# Patient Record
Sex: Female | Born: 1940 | Race: White | Hispanic: No | Marital: Single | Smoking: Former smoker
Health system: Southern US, Community
[De-identification: ages and names within clinical notes are randomized; demographics above are authoritative.]

## PROBLEM LIST (undated history)

## (undated) DIAGNOSIS — D6481 Anemia due to antineoplastic chemotherapy: Secondary | ICD-10-CM

## (undated) DIAGNOSIS — F419 Anxiety disorder, unspecified: Secondary | ICD-10-CM

## (undated) DIAGNOSIS — R180 Malignant ascites: Secondary | ICD-10-CM

## (undated) DIAGNOSIS — C7931 Secondary malignant neoplasm of brain: Secondary | ICD-10-CM

## (undated) DIAGNOSIS — Z87440 Personal history of urinary (tract) infections: Secondary | ICD-10-CM

## (undated) DIAGNOSIS — R569 Unspecified convulsions: Secondary | ICD-10-CM

## (undated) DIAGNOSIS — G629 Polyneuropathy, unspecified: Secondary | ICD-10-CM

## (undated) DIAGNOSIS — Z8719 Personal history of other diseases of the digestive system: Secondary | ICD-10-CM

## (undated) DIAGNOSIS — K219 Gastro-esophageal reflux disease without esophagitis: Secondary | ICD-10-CM

## (undated) DIAGNOSIS — G4733 Obstructive sleep apnea (adult) (pediatric): Secondary | ICD-10-CM

## (undated) DIAGNOSIS — D279 Benign neoplasm of unspecified ovary: Secondary | ICD-10-CM

## (undated) DIAGNOSIS — C3490 Malignant neoplasm of unspecified part of unspecified bronchus or lung: Secondary | ICD-10-CM

## (undated) DIAGNOSIS — K449 Diaphragmatic hernia without obstruction or gangrene: Secondary | ICD-10-CM

## (undated) DIAGNOSIS — K222 Esophageal obstruction: Secondary | ICD-10-CM

## (undated) DIAGNOSIS — T300 Burn of unspecified body region, unspecified degree: Secondary | ICD-10-CM

## (undated) DIAGNOSIS — J189 Pneumonia, unspecified organism: Secondary | ICD-10-CM

## (undated) DIAGNOSIS — K5730 Diverticulosis of large intestine without perforation or abscess without bleeding: Secondary | ICD-10-CM

## (undated) DIAGNOSIS — Z9889 Other specified postprocedural states: Secondary | ICD-10-CM

## (undated) DIAGNOSIS — C50911 Malignant neoplasm of unspecified site of right female breast: Secondary | ICD-10-CM

## (undated) DIAGNOSIS — D126 Benign neoplasm of colon, unspecified: Secondary | ICD-10-CM

## (undated) DIAGNOSIS — Z5111 Encounter for antineoplastic chemotherapy: Secondary | ICD-10-CM

## (undated) DIAGNOSIS — R112 Nausea with vomiting, unspecified: Secondary | ICD-10-CM

## (undated) DIAGNOSIS — D1803 Hemangioma of intra-abdominal structures: Secondary | ICD-10-CM

## (undated) DIAGNOSIS — Z7189 Other specified counseling: Secondary | ICD-10-CM

## (undated) DIAGNOSIS — K259 Gastric ulcer, unspecified as acute or chronic, without hemorrhage or perforation: Secondary | ICD-10-CM

## (undated) DIAGNOSIS — R1310 Dysphagia, unspecified: Secondary | ICD-10-CM

## (undated) DIAGNOSIS — C44300 Unspecified malignant neoplasm of skin of unspecified part of face: Secondary | ICD-10-CM

## (undated) DIAGNOSIS — R0602 Shortness of breath: Secondary | ICD-10-CM

## (undated) DIAGNOSIS — Z171 Estrogen receptor negative status [ER-]: Secondary | ICD-10-CM

## (undated) DIAGNOSIS — C3491 Malignant neoplasm of unspecified part of right bronchus or lung: Principal | ICD-10-CM

## (undated) DIAGNOSIS — T451X5A Adverse effect of antineoplastic and immunosuppressive drugs, initial encounter: Secondary | ICD-10-CM

## (undated) DIAGNOSIS — K652 Spontaneous bacterial peritonitis: Secondary | ICD-10-CM

## (undated) DIAGNOSIS — Z9989 Dependence on other enabling machines and devices: Secondary | ICD-10-CM

## (undated) HISTORY — DX: Dysphagia, unspecified: R13.10

## (undated) HISTORY — DX: Diverticulosis of large intestine without perforation or abscess without bleeding: K57.30

## (undated) HISTORY — DX: Estrogen receptor negative status (ER-): Z17.1

## (undated) HISTORY — DX: Benign neoplasm of unspecified ovary: D27.9

## (undated) HISTORY — DX: Hemangioma of intra-abdominal structures: D18.03

## (undated) HISTORY — DX: Gastric ulcer, unspecified as acute or chronic, without hemorrhage or perforation: K25.9

## (undated) HISTORY — PX: ANKLE FRACTURE SURGERY: SHX122

## (undated) HISTORY — DX: Adverse effect of antineoplastic and immunosuppressive drugs, initial encounter: T45.1X5A

## (undated) HISTORY — DX: Esophageal obstruction: K22.2

## (undated) HISTORY — DX: Gastro-esophageal reflux disease without esophagitis: K21.9

## (undated) HISTORY — DX: Burn of unspecified body region, unspecified degree: T30.0

## (undated) HISTORY — DX: Malignant neoplasm of unspecified part of unspecified bronchus or lung: C34.90

## (undated) HISTORY — PX: BREAST LUMPECTOMY: SHX2

## (undated) HISTORY — DX: Anemia due to antineoplastic chemotherapy: D64.81

## (undated) HISTORY — DX: Polyneuropathy, unspecified: G62.9

## (undated) HISTORY — DX: Other specified counseling: Z71.89

## (undated) HISTORY — PX: BREAST BIOPSY: SHX20

## (undated) HISTORY — DX: Malignant neoplasm of unspecified part of right bronchus or lung: C34.91

## (undated) HISTORY — DX: Encounter for antineoplastic chemotherapy: Z51.11

## (undated) HISTORY — PX: CHOLECYSTECTOMY: SHX55

## (undated) HISTORY — DX: Diaphragmatic hernia without obstruction or gangrene: K44.9

## (undated) HISTORY — PX: BREAST RECONSTRUCTION: SHX9

## (undated) HISTORY — DX: Benign neoplasm of colon, unspecified: D12.6

## (undated) HISTORY — PX: WRIST FRACTURE SURGERY: SHX121

## (undated) HISTORY — DX: Malignant neoplasm of unspecified site of right female breast: C50.911

---

## 1987-07-28 HISTORY — PX: ABDOMINAL HYSTERECTOMY: SHX81

## 1987-07-28 HISTORY — PX: BREAST RECONSTRUCTION WITH PLACEMENT OF TISSUE EXPANDER AND FLEX HD (ACELLULAR HYDRATED DERMIS): SHX6295

## 1987-07-28 HISTORY — PX: RECONSTRUCTION / CORRECTION OF NIPPLE / AEROLA: SUR1073

## 1987-07-28 HISTORY — PX: MASTECTOMY COMPLETE / SIMPLE W/ SENTINEL NODE BIOPSY: SUR846

## 1998-02-05 ENCOUNTER — Ambulatory Visit (HOSPITAL_COMMUNITY): Admission: RE | Admit: 1998-02-05 | Discharge: 1998-02-05 | Payer: Self-pay

## 1998-05-17 ENCOUNTER — Other Ambulatory Visit: Admission: RE | Admit: 1998-05-17 | Discharge: 1998-05-17 | Payer: Self-pay

## 1998-09-02 ENCOUNTER — Encounter: Payer: Self-pay | Admitting: Oncology

## 1998-09-02 ENCOUNTER — Ambulatory Visit (HOSPITAL_COMMUNITY): Admission: RE | Admit: 1998-09-02 | Discharge: 1998-09-02 | Payer: Self-pay | Admitting: Oncology

## 1998-10-25 ENCOUNTER — Encounter: Payer: Self-pay | Admitting: Oncology

## 1998-10-25 ENCOUNTER — Ambulatory Visit (HOSPITAL_COMMUNITY): Admission: RE | Admit: 1998-10-25 | Discharge: 1998-10-25 | Payer: Self-pay | Admitting: Oncology

## 1998-12-12 ENCOUNTER — Other Ambulatory Visit: Admission: RE | Admit: 1998-12-12 | Discharge: 1998-12-12 | Payer: Self-pay | Admitting: *Deleted

## 1999-02-28 ENCOUNTER — Ambulatory Visit (HOSPITAL_COMMUNITY): Admission: RE | Admit: 1999-02-28 | Discharge: 1999-02-28 | Payer: Self-pay

## 1999-06-03 ENCOUNTER — Ambulatory Visit (HOSPITAL_COMMUNITY): Admission: RE | Admit: 1999-06-03 | Discharge: 1999-06-03 | Payer: Self-pay | Admitting: Internal Medicine

## 1999-10-24 ENCOUNTER — Encounter: Payer: Self-pay | Admitting: Oncology

## 1999-10-24 ENCOUNTER — Ambulatory Visit (HOSPITAL_COMMUNITY): Admission: RE | Admit: 1999-10-24 | Discharge: 1999-10-24 | Payer: Self-pay | Admitting: Oncology

## 2000-02-05 ENCOUNTER — Encounter: Payer: Self-pay | Admitting: Oncology

## 2000-02-05 ENCOUNTER — Ambulatory Visit (HOSPITAL_COMMUNITY): Admission: RE | Admit: 2000-02-05 | Discharge: 2000-02-05 | Payer: Self-pay | Admitting: Oncology

## 2000-04-27 ENCOUNTER — Other Ambulatory Visit: Admission: RE | Admit: 2000-04-27 | Discharge: 2000-04-27 | Payer: Self-pay | Admitting: *Deleted

## 2000-09-15 ENCOUNTER — Ambulatory Visit (HOSPITAL_COMMUNITY): Admission: RE | Admit: 2000-09-15 | Discharge: 2000-09-15 | Payer: Self-pay | Admitting: Oncology

## 2000-09-15 ENCOUNTER — Encounter: Payer: Self-pay | Admitting: Oncology

## 2000-10-26 ENCOUNTER — Encounter: Admission: RE | Admit: 2000-10-26 | Discharge: 2000-10-26 | Payer: Self-pay | Admitting: Oncology

## 2000-10-26 ENCOUNTER — Encounter: Payer: Self-pay | Admitting: Oncology

## 2001-04-28 ENCOUNTER — Other Ambulatory Visit: Admission: RE | Admit: 2001-04-28 | Discharge: 2001-04-28 | Payer: Self-pay | Admitting: *Deleted

## 2001-07-21 ENCOUNTER — Emergency Department (HOSPITAL_COMMUNITY): Admission: EM | Admit: 2001-07-21 | Discharge: 2001-07-21 | Payer: Self-pay | Admitting: Emergency Medicine

## 2001-07-25 ENCOUNTER — Emergency Department (HOSPITAL_COMMUNITY): Admission: EM | Admit: 2001-07-25 | Discharge: 2001-07-25 | Payer: Self-pay | Admitting: Emergency Medicine

## 2001-10-27 ENCOUNTER — Encounter: Payer: Self-pay | Admitting: Oncology

## 2001-10-27 ENCOUNTER — Encounter: Admission: RE | Admit: 2001-10-27 | Discharge: 2001-10-27 | Payer: Self-pay | Admitting: Oncology

## 2001-10-28 ENCOUNTER — Encounter: Admission: RE | Admit: 2001-10-28 | Discharge: 2001-10-28 | Payer: Self-pay | Admitting: Internal Medicine

## 2001-10-28 ENCOUNTER — Encounter: Payer: Self-pay | Admitting: Internal Medicine

## 2002-02-14 ENCOUNTER — Encounter: Payer: Self-pay | Admitting: *Deleted

## 2002-02-14 ENCOUNTER — Emergency Department (HOSPITAL_COMMUNITY): Admission: EM | Admit: 2002-02-14 | Discharge: 2002-02-14 | Payer: Self-pay | Admitting: *Deleted

## 2002-05-01 ENCOUNTER — Other Ambulatory Visit: Admission: RE | Admit: 2002-05-01 | Discharge: 2002-05-01 | Payer: Self-pay | Admitting: *Deleted

## 2002-06-19 ENCOUNTER — Encounter: Payer: Self-pay | Admitting: *Deleted

## 2002-06-19 ENCOUNTER — Emergency Department (HOSPITAL_COMMUNITY): Admission: EM | Admit: 2002-06-19 | Discharge: 2002-06-19 | Payer: Self-pay

## 2002-06-21 ENCOUNTER — Ambulatory Visit (HOSPITAL_BASED_OUTPATIENT_CLINIC_OR_DEPARTMENT_OTHER): Admission: RE | Admit: 2002-06-21 | Discharge: 2002-06-21 | Payer: Self-pay | Admitting: *Deleted

## 2002-07-11 ENCOUNTER — Encounter: Admission: RE | Admit: 2002-07-11 | Discharge: 2002-10-09 | Payer: Self-pay | Admitting: *Deleted

## 2002-11-09 ENCOUNTER — Encounter: Admission: RE | Admit: 2002-11-09 | Discharge: 2002-11-09 | Payer: Self-pay | Admitting: Internal Medicine

## 2002-11-09 ENCOUNTER — Encounter: Payer: Self-pay | Admitting: Internal Medicine

## 2003-05-08 ENCOUNTER — Other Ambulatory Visit: Admission: RE | Admit: 2003-05-08 | Discharge: 2003-05-08 | Payer: Self-pay | Admitting: *Deleted

## 2003-11-13 ENCOUNTER — Encounter: Admission: RE | Admit: 2003-11-13 | Discharge: 2003-11-13 | Payer: Self-pay | Admitting: Oncology

## 2004-05-26 ENCOUNTER — Other Ambulatory Visit: Admission: RE | Admit: 2004-05-26 | Discharge: 2004-05-26 | Payer: Self-pay | Admitting: *Deleted

## 2004-09-25 ENCOUNTER — Ambulatory Visit: Payer: Self-pay | Admitting: Oncology

## 2004-11-13 ENCOUNTER — Encounter: Admission: RE | Admit: 2004-11-13 | Discharge: 2004-11-13 | Payer: Self-pay | Admitting: *Deleted

## 2004-11-25 ENCOUNTER — Ambulatory Visit: Payer: Self-pay | Admitting: Internal Medicine

## 2005-06-02 ENCOUNTER — Other Ambulatory Visit: Admission: RE | Admit: 2005-06-02 | Discharge: 2005-06-02 | Payer: Self-pay | Admitting: *Deleted

## 2005-09-23 ENCOUNTER — Ambulatory Visit: Payer: Self-pay | Admitting: Oncology

## 2005-11-16 ENCOUNTER — Encounter: Admission: RE | Admit: 2005-11-16 | Discharge: 2005-11-16 | Payer: Self-pay | Admitting: Oncology

## 2006-08-09 ENCOUNTER — Other Ambulatory Visit: Admission: RE | Admit: 2006-08-09 | Discharge: 2006-08-09 | Payer: Self-pay | Admitting: *Deleted

## 2006-09-21 ENCOUNTER — Ambulatory Visit: Payer: Self-pay | Admitting: Oncology

## 2006-09-23 LAB — COMPREHENSIVE METABOLIC PANEL
AST: 20 U/L (ref 0–37)
Albumin: 4.4 g/dL (ref 3.5–5.2)
BUN: 16 mg/dL (ref 6–23)
CO2: 24 mEq/L (ref 19–32)
Calcium: 9.7 mg/dL (ref 8.4–10.5)
Chloride: 105 mEq/L (ref 96–112)
Creatinine, Ser: 0.57 mg/dL (ref 0.40–1.20)
Glucose, Bld: 76 mg/dL (ref 70–99)
Potassium: 3.9 mEq/L (ref 3.5–5.3)
Sodium: 138 mEq/L (ref 135–145)
Total Bilirubin: 0.8 mg/dL (ref 0.3–1.2)
Total Protein: 6.6 g/dL (ref 6.0–8.3)

## 2006-09-23 LAB — CBC WITH DIFFERENTIAL/PLATELET
BASO%: 0.7 % (ref 0.0–2.0)
EOS%: 1.7 % (ref 0.0–7.0)
MCV: 87.9 fL (ref 81.0–101.0)
MONO#: 0.5 10*3/uL (ref 0.1–0.9)
MONO%: 7.8 % (ref 0.0–13.0)
WBC: 6.3 10*3/uL (ref 3.9–10.0)

## 2006-09-23 LAB — MORPHOLOGY
PLT EST: ADEQUATE
RBC Comments: NORMAL

## 2006-09-23 LAB — LACTATE DEHYDROGENASE: LDH: 194 U/L (ref 94–250)

## 2006-11-19 ENCOUNTER — Encounter: Admission: RE | Admit: 2006-11-19 | Discharge: 2006-11-19 | Payer: Self-pay | Admitting: *Deleted

## 2006-12-13 ENCOUNTER — Ambulatory Visit (HOSPITAL_COMMUNITY): Admission: RE | Admit: 2006-12-13 | Discharge: 2006-12-13 | Payer: Self-pay | Admitting: Oncology

## 2006-12-24 ENCOUNTER — Ambulatory Visit (HOSPITAL_COMMUNITY): Admission: RE | Admit: 2006-12-24 | Discharge: 2006-12-24 | Payer: Self-pay | Admitting: Oncology

## 2007-01-14 ENCOUNTER — Ambulatory Visit: Payer: Self-pay | Admitting: Internal Medicine

## 2007-01-25 ENCOUNTER — Ambulatory Visit: Payer: Self-pay | Admitting: Internal Medicine

## 2007-08-24 ENCOUNTER — Other Ambulatory Visit: Admission: RE | Admit: 2007-08-24 | Discharge: 2007-08-24 | Payer: Self-pay | Admitting: *Deleted

## 2007-11-21 ENCOUNTER — Encounter: Admission: RE | Admit: 2007-11-21 | Discharge: 2007-11-21 | Payer: Self-pay | Admitting: Oncology

## 2007-11-28 ENCOUNTER — Ambulatory Visit: Payer: Self-pay | Admitting: Oncology

## 2007-11-30 LAB — COMPREHENSIVE METABOLIC PANEL
AST: 21 U/L (ref 0–37)
Albumin: 4.1 g/dL (ref 3.5–5.2)
Alkaline Phosphatase: 45 U/L (ref 39–117)
BUN: 14 mg/dL (ref 6–23)
CO2: 25 mEq/L (ref 19–32)
Chloride: 107 mEq/L (ref 96–112)
Glucose, Bld: 129 mg/dL — ABNORMAL HIGH (ref 70–99)
Potassium: 3.7 mEq/L (ref 3.5–5.3)
Sodium: 141 mEq/L (ref 135–145)

## 2007-11-30 LAB — CBC WITH DIFFERENTIAL/PLATELET
BASO%: 0.6 % (ref 0.0–2.0)
Basophils Absolute: 0 10*3/uL (ref 0.0–0.1)
EOS%: 2.2 % (ref 0.0–7.0)
LYMPH%: 31.5 % (ref 14.0–48.0)
MONO#: 0.4 10*3/uL (ref 0.1–0.9)
MONO%: 6.5 % (ref 0.0–13.0)
Platelets: 249 10*3/uL (ref 145–400)
RBC: 4.37 10*6/uL (ref 3.70–5.32)
RDW: 12.7 % (ref 11.3–14.5)

## 2007-11-30 LAB — MORPHOLOGY: RBC Comments: NORMAL

## 2007-12-07 ENCOUNTER — Encounter: Payer: Self-pay | Admitting: Internal Medicine

## 2008-11-21 ENCOUNTER — Encounter: Admission: RE | Admit: 2008-11-21 | Discharge: 2008-11-21 | Payer: Self-pay | Admitting: Oncology

## 2008-11-28 ENCOUNTER — Ambulatory Visit: Payer: Self-pay | Admitting: Oncology

## 2008-11-30 LAB — CBC WITH DIFFERENTIAL/PLATELET
BASO%: 0.6 % (ref 0.0–2.0)
EOS%: 1.7 % (ref 0.0–7.0)
Eosinophils Absolute: 0.1 10*3/uL (ref 0.0–0.5)
HCT: 38.7 % (ref 34.8–46.6)
HGB: 13.4 g/dL (ref 11.6–15.9)
LYMPH%: 33 % (ref 14.0–49.7)
MCHC: 34.7 g/dL (ref 31.5–36.0)
MONO%: 8.7 % (ref 0.0–14.0)
NEUT%: 56 % (ref 38.4–76.8)
Platelets: 225 10*3/uL (ref 145–400)
RDW: 14.3 % (ref 11.2–14.5)
WBC: 5.8 10*3/uL (ref 3.9–10.3)
lymph#: 1.9 10*3/uL (ref 0.9–3.3)

## 2008-11-30 LAB — COMPREHENSIVE METABOLIC PANEL
AST: 29 U/L (ref 0–37)
BUN: 15 mg/dL (ref 6–23)
Calcium: 9.2 mg/dL (ref 8.4–10.5)
Creatinine, Ser: 0.71 mg/dL (ref 0.40–1.20)
Potassium: 3.8 mEq/L (ref 3.5–5.3)
Total Bilirubin: 1 mg/dL (ref 0.3–1.2)
Total Protein: 6.1 g/dL (ref 6.0–8.3)

## 2008-12-07 ENCOUNTER — Encounter: Payer: Self-pay | Admitting: Internal Medicine

## 2009-03-05 ENCOUNTER — Encounter: Admission: RE | Admit: 2009-03-05 | Discharge: 2009-03-05 | Payer: Self-pay | Admitting: Internal Medicine

## 2009-11-22 ENCOUNTER — Encounter: Admission: RE | Admit: 2009-11-22 | Discharge: 2009-11-22 | Payer: Self-pay | Admitting: Oncology

## 2009-12-04 ENCOUNTER — Ambulatory Visit: Payer: Self-pay | Admitting: Oncology

## 2009-12-04 LAB — COMPREHENSIVE METABOLIC PANEL
ALT: 25 U/L (ref 0–35)
AST: 26 U/L (ref 0–37)
Albumin: 3.9 g/dL (ref 3.5–5.2)
BUN: 15 mg/dL (ref 6–23)
Calcium: 9.4 mg/dL (ref 8.4–10.5)
Chloride: 108 mEq/L (ref 96–112)
Sodium: 139 mEq/L (ref 135–145)
Total Bilirubin: 0.7 mg/dL (ref 0.3–1.2)
Total Protein: 6.8 g/dL (ref 6.0–8.3)

## 2009-12-04 LAB — CBC WITH DIFFERENTIAL/PLATELET
Basophils Absolute: 0.1 10*3/uL (ref 0.0–0.1)
Eosinophils Absolute: 0.1 10*3/uL (ref 0.0–0.5)
HCT: 40.6 % (ref 34.8–46.6)
LYMPH%: 31.7 % (ref 14.0–49.7)
MCH: 32.1 pg (ref 25.1–34.0)
MONO#: 0.5 10*3/uL (ref 0.1–0.9)
NEUT%: 56.4 % (ref 38.4–76.8)
RBC: 4.48 10*6/uL (ref 3.70–5.45)
RDW: 12.8 % (ref 11.2–14.5)
WBC: 6 10*3/uL (ref 3.9–10.3)

## 2009-12-09 ENCOUNTER — Encounter: Payer: Self-pay | Admitting: Internal Medicine

## 2010-01-22 ENCOUNTER — Emergency Department (HOSPITAL_COMMUNITY): Admission: EM | Admit: 2010-01-22 | Discharge: 2010-01-22 | Payer: Self-pay | Admitting: Emergency Medicine

## 2010-05-16 ENCOUNTER — Ambulatory Visit: Admission: RE | Admit: 2010-05-16 | Discharge: 2010-05-16 | Payer: Self-pay | Admitting: Internal Medicine

## 2010-08-01 ENCOUNTER — Ambulatory Visit
Admission: AD | Admit: 2010-08-01 | Discharge: 2010-08-01 | Payer: Self-pay | Source: Home / Self Care | Attending: Internal Medicine | Admitting: Internal Medicine

## 2010-08-16 ENCOUNTER — Other Ambulatory Visit: Payer: Self-pay | Admitting: Oncology

## 2010-08-16 DIAGNOSIS — Z0000 Encounter for general adult medical examination without abnormal findings: Secondary | ICD-10-CM

## 2010-08-16 DIAGNOSIS — Z9011 Acquired absence of right breast and nipple: Secondary | ICD-10-CM

## 2010-08-26 NOTE — Letter (Signed)
[facility]   Imported By: Sherian Rein 12/27/2009 11:08:51  _____________________________________________________________________  External Attachment:    Type:   Image     Comment:   External Document

## 2010-10-16 ENCOUNTER — Other Ambulatory Visit (HOSPITAL_COMMUNITY): Payer: Self-pay | Admitting: Internal Medicine

## 2010-10-16 DIAGNOSIS — R52 Pain, unspecified: Secondary | ICD-10-CM

## 2010-10-20 ENCOUNTER — Ambulatory Visit (HOSPITAL_COMMUNITY): Payer: Self-pay

## 2010-10-20 ENCOUNTER — Other Ambulatory Visit (HOSPITAL_COMMUNITY): Payer: Self-pay | Admitting: Internal Medicine

## 2010-10-20 DIAGNOSIS — M79673 Pain in unspecified foot: Secondary | ICD-10-CM

## 2010-11-24 ENCOUNTER — Ambulatory Visit
Admission: RE | Admit: 2010-11-24 | Discharge: 2010-11-24 | Disposition: A | Discharge status: Home / Self Care | Payer: 59 | Source: Ambulatory Visit | Attending: Oncology | Admitting: Oncology

## 2010-11-24 DIAGNOSIS — Z0000 Encounter for general adult medical examination without abnormal findings: Secondary | ICD-10-CM

## 2010-11-24 DIAGNOSIS — Z9011 Acquired absence of right breast and nipple: Secondary | ICD-10-CM

## 2010-12-01 ENCOUNTER — Encounter (HOSPITAL_BASED_OUTPATIENT_CLINIC_OR_DEPARTMENT_OTHER): Payer: 59 | Admitting: Oncology

## 2010-12-01 ENCOUNTER — Other Ambulatory Visit: Payer: Self-pay | Admitting: Oncology

## 2010-12-01 DIAGNOSIS — Z9010 Acquired absence of unspecified breast and nipple: Secondary | ICD-10-CM

## 2010-12-01 DIAGNOSIS — Z853 Personal history of malignant neoplasm of breast: Secondary | ICD-10-CM

## 2010-12-01 DIAGNOSIS — Z171 Estrogen receptor negative status [ER-]: Secondary | ICD-10-CM

## 2010-12-01 DIAGNOSIS — Z9079 Acquired absence of other genital organ(s): Secondary | ICD-10-CM

## 2010-12-01 LAB — CBC WITH DIFFERENTIAL/PLATELET
Basophils Absolute: 0 10*3/uL (ref 0.0–0.1)
Eosinophils Absolute: 0.1 10*3/uL (ref 0.0–0.5)
HCT: 39 % (ref 34.8–46.6)
MCHC: 35.1 g/dL (ref 31.5–36.0)
MCV: 91.3 fL (ref 79.5–101.0)
MONO%: 6.4 % (ref 0.0–14.0)
NEUT#: 3.6 10*3/uL (ref 1.5–6.5)
Platelets: 224 10*3/uL (ref 145–400)
WBC: 5.6 10*3/uL (ref 3.9–10.3)

## 2010-12-01 LAB — COMPREHENSIVE METABOLIC PANEL
ALT: 26 U/L (ref 0–35)
AST: 26 U/L (ref 0–37)
Albumin: 3.7 g/dL (ref 3.5–5.2)
BUN: 14 mg/dL (ref 6–23)
Chloride: 106 mEq/L (ref 96–112)
Creatinine, Ser: 0.65 mg/dL (ref 0.40–1.20)

## 2010-12-01 LAB — LACTATE DEHYDROGENASE: LDH: 226 U/L (ref 94–250)

## 2010-12-08 ENCOUNTER — Encounter (HOSPITAL_BASED_OUTPATIENT_CLINIC_OR_DEPARTMENT_OTHER): Payer: 59 | Admitting: Oncology

## 2010-12-08 ENCOUNTER — Other Ambulatory Visit: Payer: Self-pay | Admitting: Oncology

## 2010-12-08 DIAGNOSIS — Z1231 Encounter for screening mammogram for malignant neoplasm of breast: Secondary | ICD-10-CM

## 2010-12-08 DIAGNOSIS — Z9010 Acquired absence of unspecified breast and nipple: Secondary | ICD-10-CM

## 2010-12-08 DIAGNOSIS — Z853 Personal history of malignant neoplasm of breast: Secondary | ICD-10-CM

## 2010-12-08 DIAGNOSIS — Z171 Estrogen receptor negative status [ER-]: Secondary | ICD-10-CM

## 2010-12-12 NOTE — Op Note (Signed)
[facility]  Patient:    Molly Black                       MRN: [id number] Proc. Date: 06/03/99 Adm. Date:  [id number] Attending:  Mervin Hack CC:         Genene Churn. Cyndie Chime, M.D.             Almedia Balls. Randell Patient, M.D.                           Operative Report  PROCEDURE:  Colonoscopy.  INDICATIONS:  This 70 year old black female with a history of breast CA has been referred for colonoscopy for neoplastic screening.  She has had a set of Hemoccults which were negative.  There is no family history of colon cancer.  She has a positive family history of hiatal hernia.  The patient discontinued her aspirin one week prior to the procedure.  ENDOSCOPE:  ______.  SEDATION: 1. Versed 7.5 mg IV. 2. Demerol 30 mg IV.  FINDINGS:  Sphincter with DD:  06/03/99 TD:  06/03/99 Job: [id number]

## 2010-12-12 NOTE — Op Note (Signed)
NAME:  Molly Black, Molly Black                         ACCOUNT NO.:  [id number]   MEDICAL RECORD NO.:  [id number]                   PATIENT TYPE:  AMB   LOCATION:  DSC                                  FACILITY:  [facility]   PHYSICIAN:  Lowell Bouton, M.D.      DATE OF BIRTH:  January 16, 1941   DATE OF PROCEDURE:  06/21/2002  DATE OF DISCHARGE:                                 OPERATIVE REPORT   PREOPERATIVE DIAGNOSIS:  Comminuted intra-articular fracture, left distal  radius.   POSTOPERATIVE DIAGNOSIS:  Comminuted intra-articular fracture, left distal  radius.   OPERATION PERFORMED:  Open reduction internal fixation, left distal radius.   SURGEON:  Lowell Bouton, M.D.   ANESTHESIA:  General.   OPERATIVE FINDINGS:  The patient had a comminuted fracture just proximal to  the joint that had an intra-articular extension.  The fracture was  significantly comminuted dorsally.   DESCRIPTION OF PROCEDURE:  Under general anesthesia, with the tourniquet on  the left arm, the left hand was prepped and draped in the usual fashion.  After exsanguinating the limb, the tourniquet was inflated to 225 mmHg.  A  volar approach was made to the distal radius through a longitudinal incision  along the FCR tendon.  Blunt dissection was carried through the subcutaneous  tissues and bleeding points were coagulated.  Blunt dissection was carried  through the FCR sheath and the FCR tendon was retracted ulnarly.  The floor  of the sheath was incised longitudinally with the scissors.  The FPL was  retracted ulnarly and the pronator quadratus was sharply divided from the  attachment on the radius.  It was elevated ulnarly, subperiosteally.  The  fracture site was then identified and then irrigated with saline.  The  patient was in 10 pounds of traction using finger traps on the index and  middle fingers over the end of the table.  The fracture was then reduced and  a DVR plate was used on the  radius.  The slotted screw was applied first  using a 3.5 mm screw.  X-rays were then taken showing good alignment of the  fracture and so the pegs were inserted in the distal holes using 2.0 mm pegs  that were unthreaded.  X-rays showed good alignment of the fracture. An  attempt to reduce the radial styloid had been tried with 35 K-wire and was  unsuccessful so the K-wire was removed.  The remaining 3.5 mm screws were  then inserted proximally.  Final x-rays were taken and fluoroscopy was used  to make sure there was no motion at the fracture site.  The wound was then  irrigated with saline.  The pronator quadratus was repaired with a 4-0  Vicryl.  Subcutaneous tissue was closed over a vessel loop drain using 4-0  Vicryl and the skin was closed with a 3-0 subcuticular Prolene.  Steri-  Strips were applied followed by sterile dressings and a sugar tong splint.  The tourniquet was released with good circulation to the hand.  The patient  went to the recovery room awake and stable in good condition.                                                 Lowell Bouton, M.D.    EMM/MEDQ  D:  06/21/2002  T:  06/21/2002  Job:  [id number]

## 2011-11-25 ENCOUNTER — Ambulatory Visit
Admission: RE | Admit: 2011-11-25 | Discharge: 2011-11-25 | Disposition: A | Discharge status: Home / Self Care | Payer: Medicare Other | Source: Ambulatory Visit | Attending: Oncology | Admitting: Oncology

## 2011-11-25 ENCOUNTER — Other Ambulatory Visit (HOSPITAL_BASED_OUTPATIENT_CLINIC_OR_DEPARTMENT_OTHER): Payer: Medicare Other | Admitting: Lab

## 2011-11-25 ENCOUNTER — Other Ambulatory Visit: Payer: Self-pay | Admitting: Oncology

## 2011-11-25 DIAGNOSIS — Z9010 Acquired absence of unspecified breast and nipple: Secondary | ICD-10-CM

## 2011-11-25 DIAGNOSIS — Z853 Personal history of malignant neoplasm of breast: Secondary | ICD-10-CM

## 2011-11-25 DIAGNOSIS — Z1231 Encounter for screening mammogram for malignant neoplasm of breast: Secondary | ICD-10-CM

## 2011-11-25 DIAGNOSIS — Z171 Estrogen receptor negative status [ER-]: Secondary | ICD-10-CM

## 2011-11-25 LAB — CBC WITH DIFFERENTIAL/PLATELET
BASO%: 1.1 % (ref 0.0–2.0)
EOS%: 1.9 % (ref 0.0–7.0)
MCH: 31.1 pg (ref 25.1–34.0)
MCHC: 34.4 g/dL (ref 31.5–36.0)
RBC: 4.44 10*6/uL (ref 3.70–5.45)
RDW: 13.1 % (ref 11.2–14.5)
WBC: 6.3 10*3/uL (ref 3.9–10.3)
lymph#: 1.8 10*3/uL (ref 0.9–3.3)
nRBC: 0 % (ref 0–0)

## 2011-11-25 LAB — COMPREHENSIVE METABOLIC PANEL
ALT: 17 U/L (ref 0–35)
AST: 23 U/L (ref 0–37)
Albumin: 4.1 g/dL (ref 3.5–5.2)
Alkaline Phosphatase: 29 U/L — ABNORMAL LOW (ref 39–117)
BUN: 18 mg/dL (ref 6–23)
Potassium: 3.7 mEq/L (ref 3.5–5.3)
Sodium: 142 mEq/L (ref 135–145)
Total Protein: 6.5 g/dL (ref 6.0–8.3)

## 2011-12-07 ENCOUNTER — Ambulatory Visit (HOSPITAL_BASED_OUTPATIENT_CLINIC_OR_DEPARTMENT_OTHER): Payer: Medicare Other | Admitting: Oncology

## 2011-12-07 ENCOUNTER — Telehealth: Payer: Self-pay | Admitting: Oncology

## 2011-12-07 ENCOUNTER — Encounter: Payer: Self-pay | Admitting: Oncology

## 2011-12-07 VITALS — BP 135/81 | HR 65 | Temp 97.5°F | Ht 63.5 in | Wt 161.6 lb

## 2011-12-07 DIAGNOSIS — K219 Gastro-esophageal reflux disease without esophagitis: Secondary | ICD-10-CM

## 2011-12-07 DIAGNOSIS — D279 Benign neoplasm of unspecified ovary: Secondary | ICD-10-CM | POA: Insufficient documentation

## 2011-12-07 DIAGNOSIS — G629 Polyneuropathy, unspecified: Secondary | ICD-10-CM

## 2011-12-07 DIAGNOSIS — Z853 Personal history of malignant neoplasm of breast: Secondary | ICD-10-CM

## 2011-12-07 DIAGNOSIS — K5730 Diverticulosis of large intestine without perforation or abscess without bleeding: Secondary | ICD-10-CM | POA: Insufficient documentation

## 2011-12-07 DIAGNOSIS — D1803 Hemangioma of intra-abdominal structures: Secondary | ICD-10-CM

## 2011-12-07 DIAGNOSIS — Z171 Estrogen receptor negative status [ER-]: Secondary | ICD-10-CM

## 2011-12-07 DIAGNOSIS — C50911 Malignant neoplasm of unspecified site of right female breast: Secondary | ICD-10-CM

## 2011-12-07 NOTE — Telephone Encounter (Signed)
gv pt appt schedule for may 2014 and mammo 11/29/2012 @ [facility].

## 2011-12-07 NOTE — Progress Notes (Signed)
Hematology and Oncology Follow Up Visit  Molly Black [facility] [id number] 09-08-1940 71 y.o. 12/07/2011 8:53 PM   Principle Diagnosis: Encounter Diagnoses  Name Primary?  . Carcinoma of breast, stage 2, estrogen receptor positive, right Yes  . Carcinoma of breast, stage 2, estrogen receptor negative, right   . Hemangioma of liver   . Neuropathy, peripheral   . Diverticulosis of colon without diverticulitis   . GERD (gastroesophageal reflux disease)   . Cystadenofibroma of ovary, unspecified laterality      Interim History:   Followup visit for this 71 year old woman with history of stage II, T2 N0 cancer of the right breast diagnosed in June 1989, treated with mastectomy with immediate reconstruction followed by CMF chemotherapy.  Tumor was ER negative, PR positive.  She finally retired from [employer] where she worked for 45 years. 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   She's had no interim medical problems. Unfortunately she lost her only brother about 2 months ago. He had surgery for what sounds like a extensive melanoma skin cancer arising on the back of his scalp. About a month after the surgery he suffered sudden death and it appears that he had a pulmonary embolus and a DVT. He had pain and swelling in his leg for a few weeks after the surgery. She was very close to this brother and is still grieving his loss.  Medications: reviewed  Allergies: No Known Allergies  Review of Systems: Constitutional:   No constitutional symptoms Respiratory: No cough or dyspnea Cardiovascular:  No chest pain or palpitations Gastrointestinal: No change in bowel habit Genito-Urinary: No vaginal bleeding Musculoskeletal: no muscle or bone pain Neurologic: No headache or change in vision Skin: No rash or ecchymosis Remaining ROS negative.  Physical Exam: Blood pressure 135/81, pulse 65, temperature 97.5 F (36.4 C),  temperature source Oral, height 5' 3.5" (1.613 m), weight 161 lb 9.6 oz (73.301 kg). Wt Readings from Last 3 Encounters:  12/07/11 161 lb 9.6 oz (73.301 kg)     General appearance: Well-nourished Caucasian woman HENNT: Pharynx no erythema or exudate Lymph nodes: No lymphadenopathy Breasts: Right breast reconstruction with a large tense saline implant; no left breast masses Lungs: Clear to auscultation resonant to percussion Heart: Regular rhythm no murmur Abdomen: Soft nontender no mass no organomegaly Extremities: No edema no calf tenderness Vascular: No cyanosis Neurologic: Pupils equal round reactive to light, optic disc sharp vessels normal, motor strength 5 over 5, reflexes 1+ symmetric. Skin: No rash or ecchymosis  Lab Results: Lab Results  Component Value Date   WBC 6.3 11/25/2011   HGB 13.8 11/25/2011   HCT 40.2 11/25/2011   MCV 90.6 11/25/2011   PLT 240 11/25/2011     Chemistry      Component Value Date/Time   NA 142 11/25/2011 1428   K 3.7 11/25/2011 1428   CL 107 11/25/2011 1428   CO2 28 11/25/2011 1428   BUN 18 11/25/2011 1428   CREATININE 0.63 11/25/2011 1428      Component Value Date/Time   CALCIUM 9.9 11/25/2011 1428   ALKPHOS 29* 11/25/2011 1428   AST 23 11/25/2011 1428   ALT 17 11/25/2011 1428   BILITOT 1.0 11/25/2011 1428       Radiological Studies: Most recent mammograms done 11/25/2011 show no new disease in the left breast   Impression and Plan: #1. Stage II T2 N0 ER negative cancer of the right breast treated  as outlined above she remains free of any new disease now out a remarkable 24 years from diagnosis in June of 1989. Although I gave her the chance to graduate from this office she would still like to be seen on an annual basis.  #2. History of TAH/BSO May 1997 for a cyst adenofibroma of the ovary  #3. Benign hemangioma of the liver  #4. Idiopathic peripheral neuropathy.  #5. Diverticulosis.  #6. GERD   CC:. Dr. Kirby Funk; Dr. Julio Alm   Levert Feinstein, MD 5/13/20138:53 PM

## 2012-01-04 ENCOUNTER — Encounter (HOSPITAL_COMMUNITY): Payer: Self-pay

## 2012-01-04 ENCOUNTER — Emergency Department (HOSPITAL_COMMUNITY)
Admission: EM | Admit: 2012-01-04 | Discharge: 2012-01-04 | Disposition: A | Discharge status: Home / Self Care | Payer: Medicare Other | Attending: Emergency Medicine | Admitting: Emergency Medicine

## 2012-01-04 ENCOUNTER — Emergency Department (HOSPITAL_COMMUNITY): Payer: Medicare Other

## 2012-01-04 DIAGNOSIS — S43036A Inferior dislocation of unspecified humerus, initial encounter: Secondary | ICD-10-CM | POA: Insufficient documentation

## 2012-01-04 DIAGNOSIS — Z79899 Other long term (current) drug therapy: Secondary | ICD-10-CM | POA: Insufficient documentation

## 2012-01-04 DIAGNOSIS — Z853 Personal history of malignant neoplasm of breast: Secondary | ICD-10-CM | POA: Insufficient documentation

## 2012-01-04 DIAGNOSIS — G609 Hereditary and idiopathic neuropathy, unspecified: Secondary | ICD-10-CM | POA: Insufficient documentation

## 2012-01-04 DIAGNOSIS — K219 Gastro-esophageal reflux disease without esophagitis: Secondary | ICD-10-CM | POA: Insufficient documentation

## 2012-01-04 DIAGNOSIS — Z7982 Long term (current) use of aspirin: Secondary | ICD-10-CM | POA: Insufficient documentation

## 2012-01-04 DIAGNOSIS — K5730 Diverticulosis of large intestine without perforation or abscess without bleeding: Secondary | ICD-10-CM | POA: Insufficient documentation

## 2012-01-04 DIAGNOSIS — D279 Benign neoplasm of unspecified ovary: Secondary | ICD-10-CM | POA: Insufficient documentation

## 2012-01-04 DIAGNOSIS — S43006A Unspecified dislocation of unspecified shoulder joint, initial encounter: Secondary | ICD-10-CM

## 2012-01-04 MED ORDER — HYDROMORPHONE HCL PF 1 MG/ML IJ SOLN
1.0000 mg | Freq: Once | INTRAMUSCULAR | Status: AC
  Administered 2012-01-04: 1 mg via INTRAVENOUS
  Filled 2012-01-04: qty 1

## 2012-01-04 MED ORDER — ETOMIDATE 2 MG/ML IV SOLN
INTRAVENOUS | Status: AC
  Filled 2012-01-04: qty 10

## 2012-01-04 MED ORDER — HYDROCODONE-ACETAMINOPHEN 5-325 MG PO TABS: ORAL_TABLET | ORAL | Status: AC

## 2012-01-04 MED ORDER — ONDANSETRON HCL 4 MG/2ML IJ SOLN
4.0000 mg | Freq: Once | INTRAMUSCULAR | Status: AC
  Administered 2012-01-04: 4 mg via INTRAVENOUS
  Filled 2012-01-04: qty 2

## 2012-01-04 MED ORDER — SODIUM CHLORIDE 0.9 % IV SOLN
INTRAVENOUS | Status: AC | PRN
  Administered 2012-01-04: 100 mL/h via INTRAVENOUS

## 2012-01-04 MED ORDER — ETOMIDATE 2 MG/ML IV SOLN
INTRAVENOUS | Status: AC | PRN
  Administered 2012-01-04: 10 mg via INTRAVENOUS

## 2012-01-04 NOTE — ED Provider Notes (Signed)
Preprocedure  Pre-anesthesia/induction confirmation of laterality/correct procedure site including "time-out."  Provider confirms review of the nurses' note, allergies, medications, pertinent labs, PMH, pre-induction vital signs, pulse oximetry, pain level, and ECG (as applicable), and patient condition satisfactory for commencing with order for sedation and procedure.  Medical screening examination/treatment/procedure(s) were conducted as a shared visit with non-physician practitioner(s) and myself.  I personally evaluated the patient during the encounter  Patient given etomidate for sedation with moderate response. Right Shoulder was then reduced using traction rotation. Patient recovered well from the conscious sedation. Awake alert can handle her secretions. post reduction films showed good alignment.  Molly Baker, MD 01/04/12 1308

## 2012-01-04 NOTE — ED Provider Notes (Signed)
History     CSN: [id number]  Arrival date & time 01/04/12  1046   First MD Initiated Contact with Patient 01/04/12 1103      Chief Complaint  Patient presents with  . Fall    (Consider location/radiation/quality/duration/timing/severity/associated sxs/prior treatment) HPI Comments: Patient complains of pain to her right shoulder that occurred several hours ago when she slipped and fell onto her right shoulder. Pain to the right shoulder is worse with any type of movement and improves slightly with immobilization.  Patient states she slipped on a wet surface. She denies head injury, headache,  neck pain, chest pain, rib pain, dizziness, or vomiting.  Patient is a 71 y.o. female presenting with fall and shoulder injury. The history is provided by the patient.  Fall The accident occurred 1 to 2 hours ago. The fall occurred while standing. She landed on concrete. There was no blood loss. The point of impact was the right shoulder. The pain is present in the right shoulder. The pain is severe. She was ambulatory at the scene. There was no entrapment after the fall. There was no drug use involved in the accident. There was no alcohol use involved in the accident. Pertinent negatives include no fever, no numbness, no abdominal pain, no nausea, no vomiting, no headaches, no loss of consciousness and no tingling. The symptoms are aggravated by activity, rotation, extension, pressure on the injury and use of the injured limb. Prehospitalization: sling applied to right arm. The treatment provided mild relief.  Shoulder Injury This is a new problem. The current episode started today. The problem occurs constantly. The problem has been unchanged. Associated symptoms include arthralgias. Pertinent negatives include no abdominal pain, chest pain, chills, fever, headaches, joint swelling, nausea, neck pain, numbness, rash, sore throat, vomiting or weakness. The symptoms are aggravated by bending (movement and  palpation). She has tried immobilization for the symptoms. The treatment provided no relief.    Past Medical History  Diagnosis Date  . Carcinoma of breast, stage 2, estrogen receptor negative, right 12/07/2011    T2N0 right breast mastectomy/TRAM reconstruction ER negative PR positive  June 1989  Then CMF chemo  . Hemangioma of liver 12/07/2011  . Neuropathy, peripheral 12/07/2011  . Diverticulosis of colon without diverticulitis 12/07/2011  . GERD (gastroesophageal reflux disease) 12/07/2011  . Cystadenofibroma of ovary, unspecified laterality 12/07/2011    Past Surgical History  Procedure Date  . Mastectomy     right    No family history on file.  History  Substance Use Topics  . Smoking status: Never Smoker   . Smokeless tobacco: Not on file  . Alcohol Use: No    OB History    Grav Para Term Preterm Abortions TAB SAB Ect Mult Living                  Review of Systems  Constitutional: Negative for fever and chills.  HENT: Negative for sore throat and neck pain.   Respiratory: Negative for chest tightness and shortness of breath.   Cardiovascular: Negative for chest pain.  Gastrointestinal: Negative for nausea, vomiting and abdominal pain.  Genitourinary: Negative for dysuria and difficulty urinating.  Musculoskeletal: Positive for arthralgias. Negative for joint swelling.  Skin: Negative for color change, rash and wound.  Neurological: Negative for dizziness, tingling, loss of consciousness, weakness, numbness and headaches.  All other systems reviewed and are negative.    Allergies  Review of patient's allergies indicates no known allergies.  Home Medications   Current  Outpatient Rx  Name Route Sig Dispense Refill  . ALENDRONATE SODIUM 70 MG PO TABS Oral Take 70 mg by mouth every 7 (seven) days. Take with a full glass of water on an empty stomach. On Saturday    . ASPIRIN EC 81 MG PO TBEC Oral Take 81 mg by mouth daily.    Marland Kitchen CALCIUM 600+D PO Oral Take by mouth  2 (two) times daily.    Marland Kitchen VITAMIN D3 1000 UNITS PO CAPS Oral Take 1 capsule by mouth daily.     . OMEGA-3 FATTY ACIDS 1000 MG PO CAPS Oral Take 1 g by mouth daily.    . MULTIVITAMINS PO CAPS Oral Take 1 capsule by mouth daily.    . OCUVITE-LUTEIN PO CAPS Oral Take 1 capsule by mouth daily.    Marland Kitchen VITAMIN B-12 1000 MCG PO TABS Oral Take 1,000 mcg by mouth daily.    Marland Kitchen ESOMEPRAZOLE MAGNESIUM 40 MG PO CPDR Oral Take 40 mg by mouth daily as needed.      BP 126/89  Pulse 62  Temp(Src) 97.8 F (36.6 C) (Oral)  Ht 5\' 3"  (1.6 m)  Wt 159 lb (72.122 kg)  BMI 28.17 kg/m2  SpO2 96%  Physical Exam  Nursing note and vitals reviewed. Constitutional: She is oriented to person, place, and time. She appears well-developed and well-nourished. No distress.  HENT:  Head: Normocephalic and atraumatic.  Neck: Normal range of motion. Neck supple.  Cardiovascular: Normal rate, regular rhythm, normal heart sounds and intact distal pulses.   No murmur heard. Pulmonary/Chest: Effort normal and breath sounds normal.  Abdominal: Soft. She exhibits no distension. There is no tenderness.  Musculoskeletal: She exhibits tenderness. She exhibits no edema.       Right shoulder: She exhibits decreased range of motion, tenderness and pain. She exhibits no swelling, no effusion, no crepitus, no deformity, no laceration, normal pulse and normal strength.       Arms:      ttp of the right shoulder, step off deformity noted.  Radial pulse is brisk, sensation intact.  CR< 2 sec. Right elbow and wrist are NT.      Lymphadenopathy:    She has no cervical adenopathy.  Neurological: She is alert and oriented to person, place, and time. She exhibits normal muscle tone. Coordination normal.  Skin: Skin is warm and dry.    ED Course  Procedures (including critical care time)  Dg Shoulder Right  01/04/2012  *RADIOLOGY REPORT*  Clinical Data: Right shoulder pain after fall.  RIGHT SHOULDER - 2+ VIEW  Comparison: None.  Findings:  Two-view exam shows a intro inferior dislocation of the humeral head.  Positioning is somewhat suboptimal wall and limits assessment for associated fractures.  Curvilinear density over the superolateral right lung raises a question of associated pneumothorax.  IMPRESSION: Anterior right shoulder dislocation.  Question right-sided pneumothorax.  Dedicated chest x-ray recommended to further evaluate.  I personally discussed these findings by telephone with Dr. Freida Busman at to 11:44 a.m. on 01/04/12.  Original Report Authenticated By: ERIC A. MANSELL, M.D.   Dg Chest Port 1 View  01/04/2012  *RADIOLOGY REPORT*  Clinical Data: Fall  PORTABLE CHEST - 1 VIEW  Comparison: Portable exam 1213 hours compared to 01/22/2010  Findings: Slight rotation to the right. Mild enlargement of cardiac silhouette. Mediastinal contours and pulmonary vascularity normal. Minimal right base atelectasis. Remaining lungs clear. No pleural effusion or pneumothorax. Questionable nodular density projects over the right heart, 1.4 cm diameter, could be  related to pulmonary vein but pulmonary nodule not excluded. Bones appear demineralized.  IMPRESSION: Minimal enlargement of cardiac silhouette. Right basilar atelectasis. Cannot exclude a 1.4 cm diameter right basilar lung nodule though this could potentially be related to a prominent pulmonary vein the patient is slightly rotated. Upright PA and lateral chest radiograph recommended for further evaluation.  Original Report Authenticated By: Lollie Marrow, M.D.       MDM    Previous medical charts, nursing notes and vitals signs from this visit were reviewed by me   All laboratory results and/or imaging results performed on this visit, if applicable, were reviewed by me and discussed with the patient and/or parent as well as recommendation for follow-up    MEDICATIONS GIVEN IN ED:  Dilaudid , zofran IV   discussed x ray results with EDP.  Patient is NV intact at this time.  Further  management of shoulder reduction and conscious sedation to be performed by the EDP, Dr. Freida Busman.          Molly Black L. Dajanique Robley, [place] 01/04/12 1431

## 2012-01-04 NOTE — ED Notes (Signed)
Pt slipped on wet steps at the pool and fell onto r shoulder.  Deformity noted to r shoulder.  EMS placed r arm in sling.  Denies any LOC, denies hurting anywhere else.

## 2012-01-04 NOTE — ED Provider Notes (Signed)
Medical screening examination/treatment/procedure(s) were conducted as a shared visit with non-physician practitioner(s) and myself.  I personally evaluated the patient during the encounter  Toy Baker, MD 01/04/12 1455

## 2012-01-04 NOTE — Discharge Instructions (Signed)

## 2012-01-13 ENCOUNTER — Ambulatory Visit (INDEPENDENT_AMBULATORY_CARE_PROVIDER_SITE_OTHER): Payer: Medicare Other | Admitting: Orthopedic Surgery

## 2012-01-13 ENCOUNTER — Encounter: Payer: Self-pay | Admitting: Orthopedic Surgery

## 2012-01-13 VITALS — BP 126/86 | Ht 63.0 in | Wt 159.0 lb

## 2012-01-13 DIAGNOSIS — S43016A Anterior dislocation of unspecified humerus, initial encounter: Secondary | ICD-10-CM

## 2012-01-13 NOTE — Progress Notes (Signed)
Patient ID: Molly Black [facility], female   DOB: 1941-02-27, 71 y.o.   MRN: [id number]    Chief Complaint  Patient presents with  . Shoulder Injury    follow up dislocation of shoulder, DOI 01/04/12   Shoulder dislocation, RIGHT. Date of injury January 04, 2012 Injury occurred at the [facility], secondary to slipping.  Pain currently, mild intermittent.  Initial treatment sling after closed reduction in the emergency room.  Review of systems was normal except for some respiratory disease.   BP 126/86  Ht 5\' 3"  (1.6 m)  Wt 72.122 kg (159 lb)  BMI 28.17 kg/m2  The following portions of the patient's history were reviewed and updated as appropriate: allergies, current medications, past family history, past medical history, past social history, past surgical history and problem list.   Review of Systems A comprehensive review of systems was negative.   Objective:    BP 122/72  Ht 4\' 11"  (1.499 m)  Wt 61.236 kg (135 lb)  BMI 27.27 kg/m2        Vital signs are stable as recorded  General appearance is normal  The patient is alert and oriented x3  The patient's mood and affect are normal  Gait assessment: normal  The cardiovascular exam reveals normal pulses and temperature without edema swelling.  The lymphatic system is negative for palpable lymph nodes  The sensory exam is normal.  There are no pathologic reflexes.  Balance is normal.   Exam of the RIGHT shoulder.  Minimal swelling. Active abduction, 90. Stability normal. Strength mild weakness in the cuff. Skin intact.  X-rays were done at the hospital. Initial film shows anterior dislocation, followed by a secondary film showing reduction      Assessment:    Right shoulder dislocation     Plan:    PT referral.

## 2012-01-13 NOTE — Patient Instructions (Addendum)
Remove swelling  Occupational therapy.  No aerobics for one month

## 2012-01-14 ENCOUNTER — Other Ambulatory Visit: Payer: Self-pay | Admitting: *Deleted

## 2012-01-14 DIAGNOSIS — S43006A Unspecified dislocation of unspecified shoulder joint, initial encounter: Secondary | ICD-10-CM

## 2012-01-19 ENCOUNTER — Ambulatory Visit (HOSPITAL_COMMUNITY)
Admission: RE | Admit: 2012-01-19 | Discharge: 2012-01-19 | Disposition: A | Discharge status: Home / Self Care | Payer: Medicare Other | Source: Ambulatory Visit | Attending: Orthopedic Surgery | Admitting: Orthopedic Surgery

## 2012-01-19 DIAGNOSIS — IMO0001 Reserved for inherently not codable concepts without codable children: Secondary | ICD-10-CM | POA: Insufficient documentation

## 2012-01-19 DIAGNOSIS — M25519 Pain in unspecified shoulder: Secondary | ICD-10-CM | POA: Insufficient documentation

## 2012-01-19 DIAGNOSIS — S43006A Unspecified dislocation of unspecified shoulder joint, initial encounter: Secondary | ICD-10-CM | POA: Insufficient documentation

## 2012-01-19 DIAGNOSIS — M25619 Stiffness of unspecified shoulder, not elsewhere classified: Secondary | ICD-10-CM | POA: Insufficient documentation

## 2012-01-19 DIAGNOSIS — M6281 Muscle weakness (generalized): Secondary | ICD-10-CM | POA: Insufficient documentation

## 2012-01-19 NOTE — Evaluation (Signed)
Occupational Therapy Evaluation  Patient Details  Name: Molly Black MRN: [id number] Date of Birth: 1940/08/10  Today's Date: 01/19/2012 Time: 0950-1030 OT Time Calculation (min): 40 min  Evaluation: 0950-1030 40' Visit#: 1  of 12   Re-eval: 02/16/12  Assessment Diagnosis: Right shoulder dislocation Next MD Visit: 6 weeks Prior Therapy: None for this problem   Past Medical History:  Past Medical History  Diagnosis Date  . Carcinoma of breast, stage 2, estrogen receptor negative, right 12/07/2011    T2N0 right breast mastectomy/TRAM reconstruction ER negative PR positive  June 1989  Then [facility] chemo  . Hemangioma of liver 12/07/2011  . Neuropathy, peripheral 12/07/2011  . Diverticulosis of colon without diverticulitis 12/07/2011  . GERD (gastroesophageal reflux disease) 12/07/2011  . Cystadenofibroma of ovary, unspecified laterality 12/07/2011   Past Surgical History:  Past Surgical History  Procedure Date  . Mastectomy     right  . Cholecystectomy   . Abdominal hysterectomy   . Ankle surgery     RIGHT  . Arm surgery     RIGHT  . Breast reconstruction     RIGHT    Subjective Symptoms/Limitations Symptoms: S: "[facility]'m doing [facility] good." Pertinent History: Molly Black fell when climbing out of the pool at [employer] after her water aerobics class on 01/04/12. She reports there were children sitting on the steps and as she was trying to maneuver around them she slipped and fell on her right shoulder. In the emergency room, a closed reduction was performed to put the shoulder joint back in place. She wore a sling for 9 days.  She has been referred to occupational therapy for evaluation and treatment. Patient Stated Goals: "I dont want any pain in this shoulder and want it to do everything my left one can" Pain Assessment Currently in Pain?: Yes Pain Score:   4 Pain Location: Shoulder Pain Orientation: Right Pain Type:  Acute pain Pain Onset: 1 to 4 weeks ago Pain Relieving Factors: Medication - advil  Precautions/Restrictions  Precautions Precautions: Shoulder Type of Shoulder Precautions: For 3 months, combined external rotation and abduction should be avoided. Precaution Booklet Issued: No Precaution Comments: Educated Molly Black on this precaution and the importance of following it. 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 Leisure: Hobbies-yes (Comment) Comments: Molly Black enjoys her  taking water aerobics at [employer], walking, and spending time outside.  Assessment ADL/Vision/Perception ADL ADL Comments: Molly Black is able to complete all of her BADLs independently but most requre increased time. She has difficulty fastening her bra in the back. She also has difficulty performing household tasks such as mopping and vacuuming. Dominant Hand: Right  Cognition/Observation Cognition Overall Cognitive Status: Appears within functional limits for tasks assessed Arousal/Alertness: Awake/alert Orientation Level: Oriented X4 Observation/Other Assessments Other Assessments: UEFI 72/80 (90%)  Sensation/Coordination/Edema Sensation Light Touch: Appears Intact Stereognosis: Not tested Hot/Cold: Not tested Proprioception: Not tested Coordination Gross Motor Movements are Fluid and Coordinated: Yes Fine Motor Movements are Fluid and Coordinated: Yes  Additional Assessments RUE AROM (degrees) RUE Overall AROM Comments: AROM measured in seated position. Int/ext rot measured with arm adducted. Right Shoulder  Flexion: 140 Degrees Right Shoulder  ABduction: 150 Degrees Right Shoulder Internal Rotation: 68 Degrees Right Shoulder External Rotation: 55 Degrees RUE Strength RUE Overall Strength Comments: Strength tested in seated position. Ext/int rot tested with arm adducted. Right Shoulder Flexion:  (4+) Right Shoulder ABduction: 4/5 Right Shoulder Internal Rotation:  (4+) Right Shoulder External Rotation:  (4-) LUE Assessment LUE Assessment: Within Functional Limits          Occupational Therapy Assessment and Plan OT Assessment and Plan Clinical Impression Statement: 71 y/o woman who experienced a right anterior shoulder dislocation secondary to a fall getting out of a swimming pool. Her limited P/AROM and strength as well as increased pain and tightness limit her ability to complete her B/IADL tasks at her prior level of function. Pt will benefit from skilled therapeutic intervention in order to improve on the following deficits: Decreased activity tolerance;Decreased range of motion;Decreased strength;Increased fascial restricitons;Impaired UE functional use;Pain OT Treatment/Interventions: Self-care/ADL training;Therapeutic exercise;Manual therapy;Modalities;Therapeutic activities;Patient/family education OT Plan: P: Skilled OT intervention to decrease pain and restrictions and increase A/PROM and strength needed to return to prior level of function with work and household tasks. Treatment Plan: MFR and manual stretching; PROM in supine; AAROM in supine and seated with dowel rod; pulleys; prot/ret/elev; ball stretches; elev/row/ext. Once she demonstrates ease with dowel and full AAROM, progress to AROM with strengthening.     Goals Short Term Goals Time to Complete Short Term Goals: 3 weeks Short Term Goal 1: Patient will be educated on HEP. Short Term Goal 2: Patient will improve R shoulder PROM to [facility] in order to increase independence with fastening her bra. Short Term Goal 3: Patient will increase R shoulder AROM to within  90% of normal range to increase independence with mopping. Short Term Goal 4: Patient will decrease fascial restrictions from mod to min-mod in right shoulder region. Short Term Goal 5: Patient will decrease pain in right shoulder to 4/10 when cleaning the house. Long Term Goals Long Term Goal 1: Patient will return to PLOF with all B/IADL and leisure tasks. Long Term Goal 2: Patient will increase R shoulder AROM to WNL in order to increase independence with retrieving items from overhead cabinets/shelves in her kitchen. Long Term Goal 3: Patient will increase R shoulder strength to 5/5 to increase independence with vacuuming.  Long Term Goal 4: Patient will decrease fascial restrictions to trace in her right shoulder region. Long Term Goal 5: Patient will decrease pain to 1/10 throughout the day when performing her normal activities.   Problem List Patient Active Problem List  Diagnosis  . Carcinoma of breast, stage 2, estrogen receptor negative, right  . Hemangioma of liver  . Neuropathy, peripheral  . Diverticulosis of colon without diverticulitis  . GERD (gastroesophageal reflux disease)  . Cystadenofibroma of ovary, unspecified laterality  . Closed dislocation of shoulder, unspecified site    End of Session Activity Tolerance: Patient tolerated treatment well General Behavior During Session: [facility] for tasks performed Cognition: [facility] for tasks performed OT Plan of Care OT Home Exercise Plan: Dowel rod; shoulder stretches OT Patient Instructions: Educated patient on HEP as well as precaution of avoiding combined abduction/ext rot. Consulted and Agree with Plan of Care: Patient   Laverta Baltimore, OTS Occupational Therapy Student  01/19/2012, 11:30 AM  Physician Documentation Your signature is required to indicate approval of the treatment plan as stated above.  Please sign and either send electronically or make a copy of this report for your files and return this  physician signed  original.  Please mark one 1.__approve of plan  2. ___approve of plan with the following conditions.   ______________________________                                                          _____________________ Physician Signature                                                                                                             Date

## 2012-01-19 NOTE — Evaluation (Addendum)
Occupational Therapy Evaluation  Patient Details  Name: Molly Black MRN: [id number] Date of Birth: 28-May-1941  Today's Date: 01/19/2012 Time: 0950-1030 OT Time Calculation (min): 40 min  Evaluation: 0950-1030 40' Visit#: 1  of 12   Re-eval: 02/16/12  Assessment Diagnosis: Right shoulder dislocation Next MD Visit: 6 weeks Prior Therapy: None for this problem   Past Medical History:  Past Medical History  Diagnosis Date  . Carcinoma of breast, stage 2, estrogen receptor negative, right 12/07/2011    T2N0 right breast mastectomy/TRAM reconstruction ER negative PR positive  June 1989  Then [facility] chemo  . Hemangioma of liver 12/07/2011  . Neuropathy, peripheral 12/07/2011  . Diverticulosis of colon without diverticulitis 12/07/2011  . GERD (gastroesophageal reflux disease) 12/07/2011  . Cystadenofibroma of ovary, unspecified laterality 12/07/2011   Past Surgical History:  Past Surgical History  Procedure Date  . Mastectomy     right  . Cholecystectomy   . Abdominal hysterectomy   . Ankle surgery     RIGHT  . Arm surgery     RIGHT  . Breast reconstruction     RIGHT    Subjective Symptoms/Limitations Symptoms: S: "[facility] but I think i'm doing pretty good." Pertinent History: Mrs. Bulthuis fell when climbing out of the pool at [employer] after her water aerobics class on 01/04/12. She reports there were children sitting on the steps and as she was trying to maneuver around them she slipped and fell on her right shoulder. In the emergency room, a closed reduction was performed to put the shoulder joint back in place. She wore a sling for 9 days.  She has been referred to occupational therapy for evaluation and treatment. Patient Stated Goals: "I dont want any pain in this shoulder and want it to do everything my left one can" Pain Assessment Currently in Pain?: Yes Pain Score:   4 Pain Location: Shoulder Pain Orientation: Right Pain Type:  Acute pain Pain Onset: 1 to 4 weeks ago Pain Relieving Factors: Medication - advil  Precautions/Restrictions  Precautions Precautions: Shoulder Type of Shoulder Precautions: For 3 months, combined external rotation and abduction should be avoided. Precaution Booklet Issued: No Precaution Comments: Educated Mrs. Pounds on this precaution and the importance of following it. 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 Leisure: Hobbies-yes (Comment) Comments: Mrs. Guida enjoys her  taking water aerobics at [employer], walking, and spending time outside.  Assessment ADL/Vision/Perception ADL ADL Comments: Mrs. Wakefield is able to complete all of her BADLs independently but most requre increased time. She has difficulty fastening her bra in the back. She also has difficulty performing household tasks such as mopping and vacuuming. Dominant Hand: Right  Cognition/Observation Cognition Overall Cognitive Status: Appears within functional limits for tasks assessed Arousal/Alertness: Awake/alert Orientation Level: Oriented X4 Observation/Other Assessments Other Assessments: UEFI 72/80 (90%)  Sensation/Coordination/Edema Sensation Light Touch: Appears Intact Stereognosis: Not tested Hot/Cold: Not tested Proprioception: Not tested Coordination Gross Motor Movements are Fluid and Coordinated: Yes Fine Motor Movements are Fluid and Coordinated: Yes  Additional Assessments RUE AROM (degrees) RUE Overall AROM Comments: AROM measured in seated position. Int/ext rot measured with arm adducted. Right Shoulder  Flexion: 140 Degrees Right Shoulder  ABduction: 150 Degrees Right Shoulder Internal Rotation: 68 Degrees Right Shoulder External Rotation: 55 Degrees RUE Strength RUE Overall Strength Comments: Strength tested in seated position. Ext/int rot tested with arm adducted. Right Shoulder Flexion:  (4+) Right Shoulder ABduction: 4/5 Right Shoulder Internal Rotation:  (4+) Right Shoulder External Rotation:  (4-) LUE Assessment LUE Assessment: Within Functional Limits          Occupational Therapy Assessment and Plan OT Assessment and Plan Clinical Impression Statement: 71 y/o woman who experienced a right anterior shoulder dislocation secondary to a fall getting out of a swimming pool. Her limited P/AROM and strength as well as increased pain and tightness limit her ability to complete her B/IADL tasks at her prior level of function. Pt will benefit from skilled therapeutic intervention in order to improve on the following deficits: Decreased activity tolerance;Decreased range of motion;Decreased strength;Increased fascial restricitons;Impaired UE functional use;Pain 2 x week x 6 weeks OT Treatment/Interventions: Self-care/ADL training;Therapeutic exercise;Manual therapy;Modalities;Therapeutic activities;Patient/family education OT Plan: P: Skilled OT intervention to decrease pain and restrictions and increase A/PROM and strength needed to return to prior level of function with work and household tasks. Treatment Plan: MFR and manual stretching; PROM in supine; AAROM in supine and seated with dowel rod; pulleys; prot/ret/elev; ball stretches; elev/row/ext. Once she demonstrates ease with dowel and full AAROM, progress to AROM with strengthening.     Goals Short Term Goals Time to Complete Short Term Goals: 3 weeks Short Term Goal 1: Patient will be educated on HEP. Short Term Goal 2: Patient will improve R shoulder PROM to [facility] in order to increase independence with fastening her bra. Short Term Goal 3: Patient will increase R  shoulder AROM to within 90% of normal range to increase independence with mopping. Short Term Goal 4: Patient will decrease fascial restrictions from mod to min-mod in right shoulder region. Short Term Goal 5: Patient will decrease pain in right shoulder to 4/10 when cleaning the house. Long Term Goals Long Term Goal 1: Patient will return to PLOF with all B/IADL and leisure tasks. Long Term Goal 2: Patient will increase R shoulder AROM to WNL in order to increase independence with retrieving items from overhead cabinets/shelves in her kitchen. Long Term Goal 3: Patient will increase R shoulder strength to 5/5 to increase independence with vacuuming.  Long Term Goal 4: Patient will decrease fascial restrictions to trace in her right shoulder region. Long Term Goal 5: Patient will decrease pain to 1/10 throughout the day when performing her normal activities.   Problem List Patient Active Problem List  Diagnosis  . Carcinoma of breast, stage 2, estrogen receptor negative, right  . Hemangioma of liver  . Neuropathy, peripheral  . Diverticulosis of colon without diverticulitis  . GERD (gastroesophageal reflux disease)  . Cystadenofibroma of ovary, unspecified laterality  . Closed dislocation of shoulder, unspecified site    End of Session Activity Tolerance: Patient tolerated treatment well General Behavior During Session: [facility] for tasks performed Cognition: [facility] for tasks performed OT Plan of Care OT Home Exercise Plan: Dowel rod; shoulder stretches OT Patient Instructions: Educated patient on HEP as well as precaution of avoiding combined abduction/ext rot. Consulted and Agree with Plan of Care: Patient   Laverta Baltimore, OTS Occupational Therapy Student Note reviewed by clinical instructor and accurately reflects treatment session. Shirlean Mylar, OTR/L   01/19/2012, 11:30 AM  Physician Documentation Your signature is required to indicate approval of the treatment plan as  stated above.  Please sign and either send electronically or make a copy of this report for your files and return this physician signed original.  Please mark one 1.__approve of plan  2. ___approve of plan with the following conditions.   ______________________________                                                          _____________________ Physician Signature                                                                                                             Date

## 2012-01-21 ENCOUNTER — Ambulatory Visit (HOSPITAL_COMMUNITY)
Admission: RE | Admit: 2012-01-21 | Discharge: 2012-01-21 | Disposition: A | Discharge status: Home / Self Care | Payer: Medicare Other | Source: Ambulatory Visit | Attending: Internal Medicine | Admitting: Internal Medicine

## 2012-01-21 DIAGNOSIS — S43006A Unspecified dislocation of unspecified shoulder joint, initial encounter: Secondary | ICD-10-CM

## 2012-01-21 NOTE — Progress Notes (Signed)
Occupational Therapy Treatment Patient Details  Name: AARUSHI HEMRIC MRN: [id number] Date of Birth: [birth year]  Today's Date: 01/21/2012 Time: 4098-1191 OT Time Calculation (min): 40 min  Manual Therapy: 0936-1000 24' Therapeutic Exercise: 1000-1016 16' Visit#: 2  of 12   Re-eval: 02/16/12    Subjective Symptoms/Limitations Symptoms: S: Its a little sore today, I think I did too much yesterday - running errands, cleaning and all that. Pain Assessment Currently in Pain?: Yes Pain Score:   5 Pain Location: Shoulder Pain Orientation: Right Pain Type: Acute pain Pain Relieving Factors: Pt reported decrease in pain after manual.  Precautions/Restrictions   No combined abduction/ext rot for 3 months  Exercise/Treatments Supine Protraction: PROM;AAROM;Other (comment) (7) Horizontal ABduction: PROM;AAROM;Other (comment) (7) External Rotation: PROM;AAROM;Other (comment) (7) Internal Rotation: PROM;AAROM;Other (comment) (7) Flexion: PROM;AAROM;Other (comment) (7) ABduction: PROM;AAROM;Other (comment) (7) Seated Elevation: AROM;15 reps Extension: AROM;15 reps Row: AROM;15 reps   Pulleys Flexion: 1 minute ABduction: 1 minute Therapy Ball Flexion: 20 reps ABduction: 20 reps ROM / Strengthening / Isometric Strengthening Prot/Ret//Elev/Dep: 1'        Manual Therapy Manual Therapy: Myofascial release Myofascial Release: MFR and manual massage to R scapular, trapezius and shoulder regions to decrease pain/restrictions and increase pain free P/AAROM. Moderate restrictions noted.  Occupational Therapy Assessment and Plan OT Assessment and Plan Clinical Impression Statement: A: Patient reported decreased pain and tightness after manual. She has the most tightness with passive shoulder flexion, all other passive movements near [facility]. She required moderate vg and pa for proper positioning/completion of exercises, especially elev/ret/prot at wall.  OT Plan: P: Add AAROM in seated  position, increase reps of AAROM in supine. Increase duration of pulleys, add wall wash.   Goals Short Term Goals Time to Complete Short Term Goals: 3 weeks Short Term Goal 1: Patient will be educated on HEP. Short Term Goal 1 Progress: Progressing toward goal Short Term Goal 2: Patient will improve R shoulder PROM to [facility] in order to increase independence with fastening her bra. Short Term Goal 2 Progress: Progressing toward goal Short Term Goal 3: Patient will increase R shoulder AROM to within 90% of normal range to increase independence with mopping. Short Term Goal 3 Progress: Progressing toward goal Short Term Goal 4: Patient will decrease fascial restrictions from mod to min-mod in right shoulder region. Short Term Goal 4 Progress: Progressing toward goal Short Term Goal 5: Patient will decrease pain in right shoulder to 4/10 when cleaning the house. Short Term Goal 5 Progress: Progressing toward goal Long Term Goals Long Term Goal 1: Patient will return to PLOF with all B/IADL and leisure tasks. Long Term Goal 1 Progress: Progressing toward goal Long Term Goal 2: Patient will increase R shoulder AROM to WNL in order to increase independence with retrieving items from overhead cabinets/shelves in her kitchen. Long Term Goal 2 Progress: Progressing toward goal Long Term Goal 3: Patient will increase R shoulder strength to 5/5 to increase independence with vacuuming.  Long Term Goal 3 Progress: Progressing toward goal Long Term Goal 4: Patient will decrease fascial restrictions to trace in her right shoulder region. Long Term Goal 4 Progress: Progressing toward goal Long Term Goal 5: Patient will decrease pain to 1/10 throughout the day when performing her normal activities.  Long Term Goal 5 Progress: Progressing toward goal  Problem List Patient Active Problem List  Diagnosis  . Carcinoma of breast, stage 2, estrogen receptor negative, right  . Hemangioma of liver  . Neuropathy,  peripheral  .  Diverticulosis of colon without diverticulitis  . GERD (gastroesophageal reflux disease)  . Cystadenofibroma of ovary, unspecified laterality  . Closed dislocation of shoulder, unspecified site    End of Session Activity Tolerance: Patient tolerated treatment well General Behavior During Session: [facility] for tasks performed Cognition: [facility] for tasks performed    Laverta Baltimore, OTS Occupational Therapy Student  01/21/2012, 11:50 AM

## 2012-01-21 NOTE — Progress Notes (Signed)
Occupational Therapy Treatment Patient Details  Name: Molly Black MRN: [id number] Date of Birth: 06/13/1941  Today's Date: 01/21/2012 Time: 3244-0102 OT Time Calculation (min): 40 min  Manual Therapy: 0936-1000 24' Therapeutic Exercise: 1000-1016 16' Visit#: 2  of 12   Re-eval: 02/16/12    Subjective Symptoms/Limitations Symptoms: S: Its a little sore today, I think I did too much yesterday - running errands, cleaning and all that. Pain Assessment Currently in Pain?: Yes Pain Score:   5 Pain Location: Shoulder Pain Orientation: Right Pain Type: Acute pain Pain Relieving Factors: Pt reported decrease in pain after manual.  Precautions/Restrictions   No combined abduction/ext rot for 3 months  Exercise/Treatments Supine Protraction: PROM;AAROM;Other (comment) (7) Horizontal ABduction: PROM;AAROM;Other (comment) (7) External Rotation: PROM;AAROM;Other (comment) (7) Internal Rotation: PROM;AAROM;Other (comment) (7) Flexion: PROM;AAROM;Other (comment) (7) ABduction: PROM;AAROM;Other (comment) (7) Seated Elevation: AROM;15 reps Extension: AROM;15 reps Row: AROM;15 reps   Pulleys Flexion: 1 minute ABduction: 1 minute Therapy Ball Flexion: 20 reps ABduction: 20 reps ROM / Strengthening / Isometric Strengthening Prot/Ret//Elev/Dep: 1'        Manual Therapy Manual Therapy: Myofascial release Myofascial Release: MFR and manual massage to R scapular, trapezius and shoulder regions to decrease pain/restrictions and increase pain free P/AAROM. Moderate restrictions noted.  Occupational Therapy Assessment and Plan OT Assessment and Plan Clinical Impression Statement: A: Patient reported decreased pain and tightness after manual. She has the most tightness with passive shoulder flexion, all other passive movements near [facility] She required moderate vg and pa for proper positioning/completion of exercises, especially elev/ret/prot at wall.  OT Plan: P: Add AAROM in seated  position, increase reps of AAROM in supine. Increase duration of pulleys, add wall wash.   Goals Short Term Goals Time to Complete Short Term Goals: 3 weeks Short Term Goal 1: Patient will be educated on HEP. Short Term Goal 1 Progress: Progressing toward goal Short Term Goal 2: Patient will improve R shoulder PROM to [facility] in order to increase independence with fastening her bra. Short Term Goal 2 Progress: Progressing toward goal Short Term Goal 3: Patient will increase R shoulder AROM to within 90% of normal range to increase independence with mopping. Short Term Goal 3 Progress: Progressing toward goal Short Term Goal 4: Patient will decrease fascial restrictions from mod to min-mod in right shoulder region. Short Term Goal 4 Progress: Progressing toward goal Short Term Goal 5: Patient will decrease pain in right shoulder to 4/10 when cleaning the house. Short Term Goal 5 Progress: Progressing toward goal Long Term Goals Long Term Goal 1: Patient will return to PLOF with all B/IADL and leisure tasks. Long Term Goal 1 Progress: Progressing toward goal Long Term Goal 2: Patient will increase R shoulder AROM to WNL in order to increase independence with retrieving items from overhead cabinets/shelves in her kitchen. Long Term Goal 2 Progress: Progressing toward goal Long Term Goal 3: Patient will increase R shoulder strength to 5/5 to increase independence with vacuuming.  Long Term Goal 3 Progress: Progressing toward goal Long Term Goal 4: Patient will decrease fascial restrictions to trace in her right shoulder region. Long Term Goal 4 Progress: Progressing toward goal Long Term Goal 5: Patient will decrease pain to 1/10 throughout the day when performing her normal activities.  Long Term Goal 5 Progress: Progressing toward goal  Problem List Patient Active Problem List  Diagnosis  . Carcinoma of breast, stage 2, estrogen receptor negative, right  . Hemangioma of liver  . Neuropathy,  peripheral  .  Diverticulosis of colon without diverticulitis  . GERD (gastroesophageal reflux disease)  . Cystadenofibroma of ovary, unspecified laterality  . Closed dislocation of shoulder, unspecified site    End of Session Activity Tolerance: Patient tolerated treatment well General Behavior During Session: [facility] for tasks performed Cognition: [facility] for tasks performed    Molly Black, OTS Occupational Therapy Student Note reviewed by clinical instructor and accurately reflects treatment session. Molly Black, OTR/L   01/21/2012, 11:50 AM

## 2012-01-26 ENCOUNTER — Ambulatory Visit (HOSPITAL_COMMUNITY)
Admission: RE | Admit: 2012-01-26 | Discharge: 2012-01-26 | Disposition: A | Discharge status: Home / Self Care | Payer: Medicare Other | Source: Ambulatory Visit | Attending: Internal Medicine | Admitting: Internal Medicine

## 2012-01-26 DIAGNOSIS — M25619 Stiffness of unspecified shoulder, not elsewhere classified: Secondary | ICD-10-CM | POA: Insufficient documentation

## 2012-01-26 DIAGNOSIS — IMO0001 Reserved for inherently not codable concepts without codable children: Secondary | ICD-10-CM | POA: Insufficient documentation

## 2012-01-26 DIAGNOSIS — M25519 Pain in unspecified shoulder: Secondary | ICD-10-CM | POA: Insufficient documentation

## 2012-01-26 DIAGNOSIS — S43006A Unspecified dislocation of unspecified shoulder joint, initial encounter: Secondary | ICD-10-CM

## 2012-01-26 DIAGNOSIS — M6281 Muscle weakness (generalized): Secondary | ICD-10-CM | POA: Insufficient documentation

## 2012-01-26 NOTE — Progress Notes (Signed)
Occupational Therapy Treatment Patient Details  Name: Molly Black MRN: [id number] Date of Birth: 1940-09-28  Today's Date: 01/26/2012 Time: 2440-1027 OT Time Calculation (min): 52 min  Manual Therapy: 1030-1055 25' Therapeutic Exercise: 2536-6440 27' Visit#: 3  of 12   Re-eval: 02/16/12   Subjective Symptoms/Limitations Symptoms: S: I am able to do more with this arm - blowdry my hair, brush teeth, but I still cant vacuum with my R arm. Pain Assessment Currently in Pain?: Yes Pain Score:   4 Pain Location: Shoulder Pain Orientation: Right Pain Type: Acute pain  Precautions/Restrictions   No combined abduction/ext rot for 3 months after dislocation  Exercise/Treatments Supine Protraction: PROM;AAROM;15 reps Horizontal ABduction: PROM;AAROM;15 reps External Rotation: PROM;AAROM;15 reps Internal Rotation: PROM;AAROM;15 reps Flexion: PROM;AAROM;15 reps ABduction: PROM;AAROM;15 reps Seated Elevation: AROM;15 reps Extension: AROM;15 reps Row: AROM;15 reps Pulleys Flexion: 2 minutes ABduction: 2 minutes;Other (comment) (cues to slow down motion) Therapy Ball Flexion: 25 reps ABduction: 25 reps Right/Left: 5 reps ROM / Strengthening / Isometric Strengthening Wall Wash: 1'        Manual Therapy Manual Therapy: Myofascial release Myofascial Release: MFR and manual massage to right scapular, trapezius, anterior shoulder and upper arm regions to decrease pain and restrictions and increase pain free P/AAROM.  Occupational Therapy Assessment and Plan OT Assessment and Plan Clinical Impression Statement: A: Passive shoulder flexion limited by approximately 15 degrees, all other shoulder movements are WNL. Supine AAROM same as PROM. Added wall wash and AAROM in seated. Patient required moderate vc during seated AAROM and pulleys for proper positioning and to slow down motion for greater stretch/benefit. OT Plan: P: Transition to AROM in supine as patient demonstrates near  full AAROM; continue with AAROM in seated. Provide cueing for patient to slow down motion. Increase duration of wall wash. Add theraband to shoulder extension, row, retraction as patient demonstrates ease with those active movements.      Goals Short Term Goals Time to Complete Short Term Goals: 3 weeks Short Term Goal 1: Patient will be educated on HEP. Short Term Goal 1 Progress: Progressing toward goal Short Term Goal 2: Patient will improve R shoulder PROM to [facility] in order to increase independence with fastening her bra. Short Term Goal 2 Progress: Progressing toward goal Short Term Goal 3: Patient will increase R shoulder AROM to within 90% of normal range to increase independence with mopping. Short Term Goal 3 Progress: Progressing toward goal Short Term Goal 4: Patient will decrease fascial restrictions from mod to min-mod in right shoulder region. Short Term Goal 4 Progress: Progressing toward goal Short Term Goal 5: Patient will decrease pain in right shoulder to 4/10 when cleaning the house. Short Term Goal 5 Progress: Progressing toward goal Long Term Goals Long Term Goal 1: Patient will return to PLOF with all B/IADL and leisure tasks. Long Term Goal 1 Progress: Progressing toward goal Long Term Goal 2: Patient will increase R shoulder AROM to WNL in order to increase independence with retrieving items from overhead cabinets/shelves in her kitchen. Long Term Goal 2 Progress: Progressing toward goal Long Term Goal 3: Patient will increase R shoulder strength to 5/5 to increase independence with vacuuming.  Long Term Goal 3 Progress: Progressing toward goal Long Term Goal 4: Patient will decrease fascial restrictions to trace in her right shoulder region. Long Term Goal 4 Progress: Progressing toward goal Long Term Goal 5: Patient will decrease pain to 1/10 throughout the day when performing her normal activities.  Long Term Goal 5  Progress: Progressing toward goal  Problem  List Patient Active Problem List  Diagnosis  . Carcinoma of breast, stage 2, estrogen receptor negative, right  . Hemangioma of liver  . Neuropathy, peripheral  . Diverticulosis of colon without diverticulitis  . GERD (gastroesophageal reflux disease)  . Cystadenofibroma of ovary, unspecified laterality  . Closed dislocation of shoulder, unspecified site    End of Session Activity Tolerance: Patient tolerated treatment well General Behavior During Session: [facility] for tasks performed Cognition: [facility] for tasks performed   Laverta Baltimore, OTS Occupational Therapy Student Note reviewed by clinical instructor and accurately reflects treatment session. Shirlean Mylar, OTR/L   01/26/2012, 11:34 AM

## 2012-01-26 NOTE — Progress Notes (Signed)
Occupational Therapy Treatment Patient Details  Name: Molly Black MRN: [id number] Date of Birth: [birth year]  Today's Date: 01/26/2012 Time: 7829-5621 OT Time Calculation (min): 52 min  Manual Therapy: 1030-1055 25' Therapeutic Exercise: 3086-5784 27' Visit#: 3  of 12   Re-eval: 02/16/12   Subjective Symptoms/Limitations Symptoms: S: I am able to do more with this arm - blowdry my hair, brush teeth, but I still cant vacuum with my R arm. Pain Assessment Currently in Pain?: Yes Pain Score:   4 Pain Location: Shoulder Pain Orientation: Right Pain Type: Acute pain  Precautions/Restrictions   No combined abduction/ext rot for 3 months after dislocation  Exercise/Treatments Supine Protraction: PROM;AAROM;15 reps Horizontal ABduction: PROM;AAROM;15 reps External Rotation: PROM;AAROM;15 reps Internal Rotation: PROM;AAROM;15 reps Flexion: PROM;AAROM;15 reps ABduction: PROM;AAROM;15 reps Seated Elevation: AROM;15 reps Extension: AROM;15 reps Row: AROM;15 reps Pulleys Flexion: 2 minutes ABduction: 2 minutes;Other (comment) (cues to slow down motion) Therapy Ball Flexion: 25 reps ABduction: 25 reps Right/Left: 5 reps ROM / Strengthening / Isometric Strengthening Wall Wash: 1'        Manual Therapy Manual Therapy: Myofascial release Myofascial Release: MFR and manual massage to right scapular, trapezius, anterior shoulder and upper arm regions to decrease pain and restrictions and increase pain free P/AAROM.  Occupational Therapy Assessment and Plan OT Assessment and Plan Clinical Impression Statement: A: Passive shoulder flexion limited by approximately 15 degrees, all other shoulder movements are WNL. Supine AAROM same as PROM. Added wall wash and AAROM in seated. Patient required moderate vc during seated AAROM and pulleys for proper positioning and to slow down motion for greater stretch/benefit. OT Plan: P: Transition to AROM in supine as patient demonstrates near  full AAROM; continue with AAROM in seated. Provide cueing for patient to slow down motion. Increase duration of wall wash. Add theraband to shoulder extension, row, retraction as patient demonstrates ease with those active movements.      Goals Short Term Goals Time to Complete Short Term Goals: 3 weeks Short Term Goal 1: Patient will be educated on HEP. Short Term Goal 1 Progress: Progressing toward goal Short Term Goal 2: Patient will improve R shoulder PROM to [facility] in order to increase independence with fastening her bra. Short Term Goal 2 Progress: Progressing toward goal Short Term Goal 3: Patient will increase R shoulder AROM to within 90% of normal range to increase independence with mopping. Short Term Goal 3 Progress: Progressing toward goal Short Term Goal 4: Patient will decrease fascial restrictions from mod to min-mod in right shoulder region. Short Term Goal 4 Progress: Progressing toward goal Short Term Goal 5: Patient will decrease pain in right shoulder to 4/10 when cleaning the house. Short Term Goal 5 Progress: Progressing toward goal Long Term Goals Long Term Goal 1: Patient will return to PLOF with all B/IADL and leisure tasks. Long Term Goal 1 Progress: Progressing toward goal Long Term Goal 2: Patient will increase R shoulder AROM to WNL in order to increase independence with retrieving items from overhead cabinets/shelves in her kitchen. Long Term Goal 2 Progress: Progressing toward goal Long Term Goal 3: Patient will increase R shoulder strength to 5/5 to increase independence with vacuuming.  Long Term Goal 3 Progress: Progressing toward goal Long Term Goal 4: Patient will decrease fascial restrictions to trace in her right shoulder region. Long Term Goal 4 Progress: Progressing toward goal Long Term Goal 5: Patient will decrease pain to 1/10 throughout the day when performing her normal activities.  Long Term Goal 5  Progress: Progressing toward goal  Problem  List Patient Active Problem List  Diagnosis  . Carcinoma of breast, stage 2, estrogen receptor negative, right  . Hemangioma of liver  . Neuropathy, peripheral  . Diverticulosis of colon without diverticulitis  . GERD (gastroesophageal reflux disease)  . Cystadenofibroma of ovary, unspecified laterality  . Closed dislocation of shoulder, unspecified site    End of Session Activity Tolerance: Patient tolerated treatment well General Behavior During Session: [facility] for tasks performed Cognition: [facility] for tasks performed   Laverta Baltimore, OTS Occupational Therapy Student  01/26/2012, 11:34 AM

## 2012-01-29 ENCOUNTER — Ambulatory Visit (HOSPITAL_COMMUNITY)
Admission: RE | Admit: 2012-01-29 | Discharge: 2012-01-29 | Disposition: A | Discharge status: Home / Self Care | Payer: Medicare Other | Source: Ambulatory Visit | Attending: Internal Medicine | Admitting: Internal Medicine

## 2012-01-29 DIAGNOSIS — S43006A Unspecified dislocation of unspecified shoulder joint, initial encounter: Secondary | ICD-10-CM

## 2012-01-29 NOTE — Progress Notes (Signed)
Occupational Therapy Treatment Patient Details  Name: DENISA ENTERLINE MRN: [id number] Date of Birth: 02/16/1941  Today's Date: 01/29/2012 Time: 6213-0865 OT Time Calculation (min): 52 min Manual Therapy 7846-9629 25' Therapeutic Exercise 5284-1324 26'  Visit#: 4  of 12   Re-eval: 02/16/12   Subjective Symptoms/Limitations Symptoms: S:  It's doing ok, Pain Assessment Currently in Pain?: Yes Pain Score:   6 Pain Orientation: Right Pain Type: Acute pain  Precautions/Restrictions  Precautions Precautions: Shoulder Type of Shoulder Precautions: For 3 months, combined external rotation and abduction should be avoided.  Exercise/Treatments   01/29/12 1000  Shoulder Exercises: Supine  Protraction PROM;AAROM;15 reps  Horizontal ABduction PROM;AAROM;15 reps  External Rotation PROM;AAROM;15 reps  Internal Rotation PROM;AAROM;15 reps  Flexion PROM;AAROM;15 reps  ABduction PROM;AAROM;15 reps  Shoulder Exercises: Seated  Elevation AROM;15 reps  Extension AROM;15 reps  Row AROM;15 reps  Protraction AAROM;10 reps  Horizontal ABduction AAROM;10 reps  External Rotation AAROM;10 reps  Internal Rotation AAROM;10 reps  Flexion AAROM;10 reps  Abduction AAROM;10 reps  Shoulder Exercises: Pulleys  Flexion 2 minutes  ABduction 2 minutes;Other (comment) (requires constant cues to decrease pace and keep form)  Shoulder Exercises: Therapy Ball  Flexion 25 reps  ABduction 25 reps  Right/Left 5 reps  Shoulder Exercises: ROM/Strengthening  Wall Wash 2'  Prot/Ret//Elev/Dep 1'           Manual Therapy Manual Therapy: Myofascial release Myofascial Release: MFR and manual massage to right scapular, trapezius, anterior shoulder and upper arm regions to decrease pain and restrictions and increase pain free P/AAROM  Occupational Therapy Assessment and Plan OT Assessment and Plan Clinical Impression Statement: A:  Constant cuing, physical and verbal, to decrease pace and increase  attention to form. OT Plan: P:  Transition to AROM in supine.  Continue to monitor patient's form and pace with exercises.   Goals Short Term Goals Time to Complete Short Term Goals: 3 weeks Short Term Goal 1: Patient will be educated on HEP. Short Term Goal 2: Patient will improve R shoulder PROM to [facility] in order to increase independence with fastening her bra. Short Term Goal 3: Patient will increase R shoulder AROM to within 90% of normal range to increase independence with mopping. Short Term Goal 4: Patient will decrease fascial restrictions from mod to min-mod in right shoulder region. Short Term Goal 5: Patient will decrease pain in right shoulder to 4/10 when cleaning the house. Long Term Goals Long Term Goal 1: Patient will return to PLOF with all B/IADL and leisure tasks. Long Term Goal 2: Patient will increase R shoulder AROM to WNL in order to increase independence with retrieving items from overhead cabinets/shelves in her kitchen. Long Term Goal 3: Patient will increase R shoulder strength to 5/5 to increase independence with vacuuming.  Long Term Goal 4: Patient will decrease fascial restrictions to trace in her right shoulder region. Long Term Goal 5: Patient will decrease pain to 1/10 throughout the day when performing her normal activities.   Problem List Patient Active Problem List  Diagnosis  . Carcinoma of breast, stage 2, estrogen receptor negative, right  . Hemangioma of liver  . Neuropathy, peripheral  . Diverticulosis of colon without diverticulitis  . GERD (gastroesophageal reflux disease)  . Cystadenofibroma of ovary, unspecified laterality  . Closed dislocation of shoulder, unspecified site    End of Session Activity Tolerance: Patient tolerated treatment well General Behavior During Session: [facility] for tasks performed Cognition: [facility] for tasks performed  GO  Shannin Naab L. Zurii Hewes, COTA/L  01/29/2012, 4:35 PM

## 2012-02-02 ENCOUNTER — Ambulatory Visit (HOSPITAL_COMMUNITY)
Admission: RE | Admit: 2012-02-02 | Discharge: 2012-02-02 | Disposition: A | Discharge status: Home / Self Care | Payer: Medicare Other | Source: Ambulatory Visit | Attending: Internal Medicine | Admitting: Internal Medicine

## 2012-02-02 DIAGNOSIS — S43006A Unspecified dislocation of unspecified shoulder joint, initial encounter: Secondary | ICD-10-CM

## 2012-02-02 NOTE — Progress Notes (Signed)
Occupational Therapy Treatment Patient Details  Name: Molly Black MRN: [id number] Date of Birth: 1941/07/05  Today's Date: 02/02/2012 Time: 4098-1191 OT Time Calculation (min): 50 min  Manual Therapy: 4782-9562 20' Therapeutic Exericse: 1308-6578 30' Visit#: 5  of 12   Re-eval: 02/16/12    Subjective Symptoms/Limitations Symptoms: I think this weather is making it tight, but its not painful. Pain Assessment Currently in Pain?: No/denies  Precautions/Restrictions   No combined shoulder abduction/ext rot for 3 months after dislocation  Exercise/Treatments Supine Protraction: PROM;AROM;10 reps Horizontal ABduction: PROM;AROM;10 reps External Rotation: PROM;AROM;10 reps Internal Rotation: PROM;AROM;10 reps Flexion: PROM;AROM;10 reps ABduction: PROM;AROM;10 reps Seated Elevation: AROM;15 reps Extension: AROM;15 reps Row: AROM;15 reps Protraction: AAROM;15 reps Horizontal ABduction: AAROM;15 reps External Rotation: AAROM;15 reps Internal Rotation: AAROM;15 reps Flexion: AAROM;15 reps Abduction: AAROM;15 reps Therapy Ball Flexion: 25 reps ABduction: 25 reps Right/Left: 5 reps ROM / Strengthening / Isometric Strengthening UBE (Upper Arm Bike): 3' and 3' @ 1.0 Wall Wash: 2' Prot/Ret//Elev/Dep: 30" Patient has significant difficulty with this exercises, specifically the retraction, even with max vg and pa.      Manual Therapy Manual Therapy: Myofascial release Myofascial Release: MFR and manual massage to right scapular, trapezius, shoulder and upper arm regions to decreaes pain and restrictions and increase pain free P/AROM. Manual stretching also performed in all shoulder regions.  Occupational Therapy Assessment and Plan OT Assessment and Plan Clinical Impression Statement: Patient reports discomfort with PROM at approximately 90% of range. Significant improvements noted in AA/AROM and required less verbal cues for proper form and pace during most exercises.  Transitioned to AROM in supine this visit. Patient has difficulty with form of prot/ret/elev at wall. Discontinued pulleys and added UBE due to full AAROM. OT Plan: P: Transition to AROM in seated as patient has full AAROM. Add ball on wall for proximal shoulder strengthening and overhead lace to increase muscular endurance of shoulder.  Add theraband to ext/row.  Goals Short Term Goals Time to Complete Short Term Goals: 3 weeks Short Term Goal 1: Patient will be educated on HEP. Short Term Goal 1 Progress: Progressing toward goal Short Term Goal 2: Patient will improve R shoulder PROM to [facility] in order to increase independence with fastening her bra. Short Term Goal 2 Progress: Progressing toward goal Short Term Goal 3: Patient will increase R shoulder AROM to within 90% of normal range to increase independence with mopping. Short Term Goal 3 Progress: Progressing toward goal Short Term Goal 4: Patient will decrease fascial restrictions from mod to min-mod in right shoulder region. Short Term Goal 4 Progress: Progressing toward goal Short Term Goal 5: Patient will decrease pain in right shoulder to 4/10 when cleaning the house. Short Term Goal 5 Progress: Progressing toward goal Long Term Goals Long Term Goal 1: Patient will return to PLOF with all B/IADL and leisure tasks. Long Term Goal 1 Progress: Progressing toward goal Long Term Goal 2: Patient will increase R shoulder AROM to WNL in order to increase independence with retrieving items from overhead cabinets/shelves in her kitchen. Long Term Goal 2 Progress: Progressing toward goal Long Term Goal 3: Patient will increase R shoulder strength to 5/5 to increase independence with vacuuming.  Long Term Goal 3 Progress: Progressing toward goal Long Term Goal 4: Patient will decrease fascial restrictions to trace in her right shoulder region. Long Term Goal 4 Progress: Progressing toward goal Long Term Goal 5: Patient will decrease pain to  1/10 throughout the day when performing her normal activities.  Long Term Goal 5 Progress: Progressing toward goal  Problem List Patient Active Problem List  Diagnosis  . Carcinoma of breast, stage 2, estrogen receptor negative, right  . Hemangioma of liver  . Neuropathy, peripheral  . Diverticulosis of colon without diverticulitis  . GERD (gastroesophageal reflux disease)  . Cystadenofibroma of ovary, unspecified laterality  . Closed dislocation of shoulder, unspecified site    End of Session Activity Tolerance: Patient tolerated treatment well General Behavior During Session: [facility] for tasks performed Cognition: [facility] for tasks performed  GO   Laverta Baltimore, OTS Occupational Therapy Student Note reviewed by clinical instructor and accurately reflects treatment session.  Shirlean Mylar, OTR/L  02/02/2012, 11:23 AM

## 2012-02-02 NOTE — Progress Notes (Signed)
Occupational Therapy Treatment Patient Details  Name: Molly Black MRN: [id number] Date of Birth: 05-25-1941  Today's Date: 02/02/2012 Time: 4098-1191 OT Time Calculation (min): 50 min  Manual Therapy: 4782-9562 20' Therapeutic Exericse: 1308-6578 30' Visit#: 5  of 12   Re-eval: 02/16/12    Subjective Symptoms/Limitations Symptoms: I think this weather is making it tight, but its not painful. Pain Assessment Currently in Pain?: No/denies  Precautions/Restrictions   No combined shoulder abduction/ext rot for 3 months after dislocation  Exercise/Treatments Supine Protraction: PROM;AROM;10 reps Horizontal ABduction: PROM;AROM;10 reps External Rotation: PROM;AROM;10 reps Internal Rotation: PROM;AROM;10 reps Flexion: PROM;AROM;10 reps ABduction: PROM;AROM;10 reps Seated Elevation: AROM;15 reps Extension: AROM;15 reps Row: AROM;15 reps Protraction: AAROM;15 reps Horizontal ABduction: AAROM;15 reps External Rotation: AAROM;15 reps Internal Rotation: AAROM;15 reps Flexion: AAROM;15 reps Abduction: AAROM;15 reps Therapy Ball Flexion: 25 reps ABduction: 25 reps Right/Left: 5 reps ROM / Strengthening / Isometric Strengthening UBE (Upper Arm Bike): 3' and 3' @ 1.0 Wall Wash: 2' Prot/Ret//Elev/Dep: 30" Patient has significant difficulty with this exercises, specifically the retraction, even with max vg and pa.      Manual Therapy Manual Therapy: Myofascial release Myofascial Release: MFR and manual massage to right scapular, trapezius, shoulder and upper arm regions to decreaes pain and restrictions and increase pain free P/AROM. Manual stretching also performed in all shoulder regions.  Occupational Therapy Assessment and Plan OT Assessment and Plan Clinical Impression Statement: Patient reports discomfort with PROM at approximately 90% of range. Significant improvements noted in AA/AROM and required less verbal cues for proper form and pace during most exercises.  Transitioned to AROM in supine this visit. Patient has difficulty with form of prot/ret/elev at wall. Discontinued pulleys and added UBE due to full AAROM. OT Plan: P: Transition to AROM in seated as patient has full AAROM. Add ball on wall for proximal shoulder strengthening and overhead lace to increase muscular endurance of shoulder.  Add theraband to ext/row.  Goals Short Term Goals Time to Complete Short Term Goals: 3 weeks Short Term Goal 1: Patient will be educated on HEP. Short Term Goal 1 Progress: Progressing toward goal Short Term Goal 2: Patient will improve R shoulder PROM to [facility] in order to increase independence with fastening her bra. Short Term Goal 2 Progress: Progressing toward goal Short Term Goal 3: Patient will increase R shoulder AROM to within 90% of normal range to increase independence with mopping. Short Term Goal 3 Progress: Progressing toward goal Short Term Goal 4: Patient will decrease fascial restrictions from mod to min-mod in right shoulder region. Short Term Goal 4 Progress: Progressing toward goal Short Term Goal 5: Patient will decrease pain in right shoulder to 4/10 when cleaning the house. Short Term Goal 5 Progress: Progressing toward goal Long Term Goals Long Term Goal 1: Patient will return to PLOF with all B/IADL and leisure tasks. Long Term Goal 1 Progress: Progressing toward goal Long Term Goal 2: Patient will increase R shoulder AROM to WNL in order to increase independence with retrieving items from overhead cabinets/shelves in her kitchen. Long Term Goal 2 Progress: Progressing toward goal Long Term Goal 3: Patient will increase R shoulder strength to 5/5 to increase independence with vacuuming.  Long Term Goal 3 Progress: Progressing toward goal Long Term Goal 4: Patient will decrease fascial restrictions to trace in her right shoulder region. Long Term Goal 4 Progress: Progressing toward goal Long Term Goal 5: Patient will decrease pain to  1/10 throughout the day when performing her normal activities.  Long Term Goal 5 Progress: Progressing toward goal  Problem List Patient Active Problem List  Diagnosis  . Carcinoma of breast, stage 2, estrogen receptor negative, right  . Hemangioma of liver  . Neuropathy, peripheral  . Diverticulosis of colon without diverticulitis  . GERD (gastroesophageal reflux disease)  . Cystadenofibroma of ovary, unspecified laterality  . Closed dislocation of shoulder, unspecified site    End of Session Activity Tolerance: Patient tolerated treatment well General Behavior During Session: [facility] for tasks performed Cognition: [facility] for tasks performed  GO   Laverta Baltimore, OTS Occupational Therapy Student  02/02/2012, 11:23 AM

## 2012-02-04 ENCOUNTER — Ambulatory Visit (HOSPITAL_COMMUNITY)
Admission: RE | Admit: 2012-02-04 | Discharge: 2012-02-04 | Disposition: A | Discharge status: Home / Self Care | Payer: Medicare Other | Source: Ambulatory Visit | Attending: Internal Medicine | Admitting: Internal Medicine

## 2012-02-04 DIAGNOSIS — S43006A Unspecified dislocation of unspecified shoulder joint, initial encounter: Secondary | ICD-10-CM

## 2012-02-04 NOTE — Progress Notes (Signed)
Occupational Therapy Treatment Patient Details  Name: Molly Black MRN: [id number] Date of Birth: 08/12/1940  Today's Date: 02/04/2012 Time: 1305-1350 OT Time Calculation (min): 45 min  Visit#: 6  of 12   Re-eval: 02/16/12    Subjective Symptoms/Limitations Symptoms: S: I am able to lift a big watermelon now. Pain Assessment Currently in Pain?: No/denies Pain Score: 0-No pain  Precautions/Restrictions   No combined ext rot/abduction for 3 months after dislocation.  Exercise/Treatments Supine Protraction: PROM;10 reps;AROM;12 reps Horizontal ABduction: PROM;10 reps;AROM;12 reps External Rotation: PROM;10 reps;AROM;12 reps Internal Rotation: PROM;10 reps;AROM;12 reps Flexion: PROM;10 reps;AROM;12 reps ABduction: PROM;10 reps;AROM;12 reps Seated Elevation: AROM;15 reps Extension: Other (comment) (dc) Row: Other (comment) (dc) Protraction: AROM;10 reps Horizontal ABduction: AROM;10 reps External Rotation: AROM;10 reps Internal Rotation: AROM;10 reps Flexion: AROM;10 reps Abduction: AROM;10 reps   Standing External Rotation: Theraband;10 reps (red) Internal Rotation: Theraband;10 reps (red) Extension: Theraband;10 reps (red) Row: Theraband;10 reps (red) Retraction: Theraband;10 reps (red)   Therapy Ball Flexion: 25 reps ABduction: 25 reps Right/Left: 5 reps ROM / Strengthening / Isometric Strengthening UBE (Upper Arm Bike): 3' and 3' 1.5 Wall Wash: 2' with 1# Prot/Ret//Elev/Dep: 10"  attempted 1' but technique very poor despite max vg and tactile cuing, so dc exercise this date and future treatments    Manual Therapy Manual Therapy: Myofascial release Myofascial Release: MFR and manual stretching to right scapularm, trapezius, shoulder and upper arm regions to decrease pain and restrictions and increase pain free P/AROM. Increased focus on anterior shoulder region as patient reported the most tightness and pain from that region.  Occupational Therapy  Assessment and Plan OT Assessment and Plan Clinical Impression Statement: A: Added weight to wall wash and theraband to ext/int rot, retraction, row and extension for strengthening. Patient is progressing well and reports no pain only a minimal tight/pulling sensation with movement. Slight deficit in seated AROM shoulder flexion/abduction, but functional. OT Plan: P: Add 1# weight to supine and seated AROM for strengthening. Add overhead lace to increase muscular endurance of shoulder. Continue MFR focus on anterior shoudler/upper arm region to decrease fascial restrictions.   Goals Short Term Goals Time to Complete Short Term Goals: 3 weeks Short Term Goal 1: Patient will be educated on HEP. Short Term Goal 1 Progress: Progressing toward goal Short Term Goal 2: Patient will improve R shoulder PROM to [facility] in order to increase independence with fastening her bra. Short Term Goal 2 Progress: Progressing toward goal Short Term Goal 3: Patient will increase R shoulder AROM to within 90% of normal range to increase independence with mopping. Short Term Goal 3 Progress: Progressing toward goal Short Term Goal 4: Patient will decrease fascial restrictions from mod to min-mod in right shoulder region. Short Term Goal 4 Progress: Progressing toward goal Short Term Goal 5: Patient will decrease pain in right shoulder to 4/10 when cleaning the house. Short Term Goal 5 Progress: Progressing toward goal Long Term Goals Long Term Goal 1: Patient will return to PLOF with all B/IADL and leisure tasks. Long Term Goal 1 Progress: Progressing toward goal Long Term Goal 2: Patient will increase R shoulder AROM to WNL in order to increase independence with retrieving items from overhead cabinets/shelves in her kitchen. Long Term Goal 2 Progress: Progressing toward goal Long Term Goal 3: Patient will increase R shoulder strength to 5/5 to increase independence with vacuuming.  Long Term Goal 3 Progress:  Progressing toward goal Long Term Goal 4: Patient will decrease fascial restrictions to trace in her  right shoulder region. Long Term Goal 4 Progress: Progressing toward goal Long Term Goal 5: Patient will decrease pain to 1/10 throughout the day when performing her normal activities.  Long Term Goal 5 Progress: Progressing toward goal  Problem List Patient Active Problem List  Diagnosis  . Carcinoma of breast, stage 2, estrogen receptor negative, right  . Hemangioma of liver  . Neuropathy, peripheral  . Diverticulosis of colon without diverticulitis  . GERD (gastroesophageal reflux disease)  . Cystadenofibroma of ovary, unspecified laterality  . Closed dislocation of shoulder, unspecified site    End of Session Activity Tolerance: Patient tolerated treatment well General Behavior During Session: [facility] for tasks performed Cognition: [facility] for tasks performed  GO   Laverta Baltimore, OTS Occupational Therapy Student  02/04/2012, 3:04 PM

## 2012-02-04 NOTE — Progress Notes (Signed)
Note reviewed by clinical instructor and accurately reflects treatment session.  Bethany H. Murray, OTR/L  

## 2012-02-09 ENCOUNTER — Ambulatory Visit (HOSPITAL_COMMUNITY)
Admission: RE | Admit: 2012-02-09 | Discharge: 2012-02-09 | Disposition: A | Discharge status: Home / Self Care | Payer: Medicare Other | Source: Ambulatory Visit | Attending: Internal Medicine | Admitting: Internal Medicine

## 2012-02-09 NOTE — Progress Notes (Signed)
Note reviewed by clinical instructor and accurately reflects treatment session.  Madyn Ivins H. Bryar Dahms, OTR/L  

## 2012-02-09 NOTE — Progress Notes (Signed)
Occupational Therapy Treatment Patient Details  Name: Molly Black MRN: [id number] Date of Birth: [birth year]  Today's Date: 02/09/2012 Time: 7829-5621 OT Time Calculation (min): 44 min  Manual Therapy: 1030-1050 20' Therapeutic Exercise:1050-1114 24' Visit#: 7  of 12   Re-eval: 02/16/12   Subjective Symptoms/Limitations Symptoms: S: I mopped my floors yesterday and it was pretty easy. Made me tired though. Pain Assessment Currently in Pain?: Yes Pain Score:   1 Pain Location: Shoulder Pain Orientation: Right Pain Type: Acute pain  Precautions/Restrictions   Avoid combined sh abduction/ext rot 3 mo after dislocation  Exercise/Treatments Supine Protraction: PROM;Strengthening;10 reps;Weights Protraction Weight (lbs): 1# Horizontal ABduction: PROM;Strengthening;10 reps;Weights Horizontal ABduction Weight (lbs): 1# External Rotation: PROM;Strengthening;10 reps;Weights External Rotation Weight (lbs): 1# Internal Rotation: PROM;Strengthening;10 reps;Weights Internal Rotation Weight (lbs): 1# Flexion: PROM;Strengthening;10 reps;Weights Shoulder Flexion Weight (lbs): 1# ABduction: PROM;Strengthening;10 reps;Weights Shoulder ABduction Weight (lbs): 1# Seated Elevation: AROM;15 reps Protraction: Strengthening;10 reps;Weights Protraction Weight (lbs): 1# Horizontal ABduction: Strengthening;10 reps;Weights Horizontal ABduction Weight (lbs): 1# External Rotation: Strengthening;10 reps;Weights External Rotation Weight (lbs): 1# Internal Rotation: Strengthening;10 reps;Weights Internal Rotation Weight (lbs): 1# Flexion: Strengthening;10 reps;Weights Flexion Weight (lbs): 1# Abduction: Strengthening;10 reps;Weights ABduction Weight (lbs): 1#   Standing External Rotation: Theraband;10 reps Theraband Level (Shoulder External Rotation): Level 3 (Green) Internal Rotation: Theraband;10 reps Theraband Level (Shoulder Internal Rotation): Level 3 (Green) Extension: Theraband;12  reps Theraband Level (Shoulder Extension): Level 2 (Red) Row: Theraband;12 reps Theraband Level (Shoulder Row): Level 2 (Red) Retraction: Theraband;12 reps Theraband Level (Shoulder Retraction): Level 2 (Red)   Therapy Ball Flexion: Other (comment) (dc all therapy ball exercises due to patient ease) ROM / Strengthening / Isometric Strengthening UBE (Upper Arm Bike): 3' and 3' @ 2.0 Cybex Press: 1 plate;10 reps Cybex Row: 1 plate;10 reps Wall Wash: 2'30" with 1#        Manual Therapy Manual Therapy: Myofascial release Myofascial Release: MFR and manual stretching to right scapular, trapezius, shoulder and upper arm regions to decrease pain and restrictions and increase pain free P/AROM. Decreased fascial restrictions noted this visit.  Occupational Therapy Assessment and Plan OT Assessment and Plan Clinical Impression Statement: A: Patient required constant verbal and tactile cueing for proper positioning and accurate count during supine and seated strengthening. Added weight to supine and seated strengthening this visit. D/C therapy ball as it does not provide a challenge for patient. Added cybex and increase resistance of UBE. OT Plan: Increase reps of cybex and supine/seated strengthening. Add overhead lace to increase muscular endurance of shoulder.   Goals Short Term Goals Time to Complete Short Term Goals: 3 weeks Short Term Goal 1: Patient will be educated on HEP. Short Term Goal 1 Progress: Progressing toward goal Short Term Goal 2: Patient will improve R shoulder PROM to [facility] in order to increase independence with fastening her bra. Short Term Goal 2 Progress: Progressing toward goal Short Term Goal 3: Patient will increase R shoulder AROM to within 90% of normal range to increase independence with mopping. Short Term Goal 3 Progress: Progressing toward goal Short Term Goal 4: Patient will decrease fascial restrictions from mod to min-mod in right shoulder region. Short  Term Goal 4 Progress: Progressing toward goal Short Term Goal 5: Patient will decrease pain in right shoulder to 4/10 when cleaning the house. Short Term Goal 5 Progress: Progressing toward goal Long Term Goals Long Term Goal 1: Patient will return to PLOF with all B/IADL and leisure tasks. Long Term Goal 1 Progress: Progressing toward goal Long Term Goal  2: Patient will increase R shoulder AROM to WNL in order to increase independence with retrieving items from overhead cabinets/shelves in her kitchen. Long Term Goal 2 Progress: Progressing toward goal Long Term Goal 3: Patient will increase R shoulder strength to 5/5 to increase independence with vacuuming.  Long Term Goal 3 Progress: Progressing toward goal Long Term Goal 4: Patient will decrease fascial restrictions to trace in her right shoulder region. Long Term Goal 4 Progress: Progressing toward goal Long Term Goal 5: Patient will decrease pain to 1/10 throughout the day when performing her normal activities.  Long Term Goal 5 Progress: Progressing toward goal  Problem List Patient Active Problem List  Diagnosis  . Carcinoma of breast, stage 2, estrogen receptor negative, right  . Hemangioma of liver  . Neuropathy, peripheral  . Diverticulosis of colon without diverticulitis  . GERD (gastroesophageal reflux disease)  . Cystadenofibroma of ovary, unspecified laterality  . Closed dislocation of shoulder, unspecified site    End of Session Activity Tolerance: Patient tolerated treatment well General Behavior During Session: [facility] for tasks performed Cognition: [facility] for tasks performed  GO   Laverta Baltimore, OTS Occupational Therapy Student  02/09/2012, 11:21 AM

## 2012-02-10 ENCOUNTER — Ambulatory Visit (HOSPITAL_COMMUNITY)
Admission: RE | Admit: 2012-02-10 | Discharge: 2012-02-10 | Disposition: A | Discharge status: Home / Self Care | Payer: Medicare Other | Source: Ambulatory Visit | Attending: Internal Medicine | Admitting: Internal Medicine

## 2012-02-10 DIAGNOSIS — S43006A Unspecified dislocation of unspecified shoulder joint, initial encounter: Secondary | ICD-10-CM

## 2012-02-10 NOTE — Progress Notes (Signed)
Occupational Therapy Treatment Patient Details  Name: Molly Black MRN: [id number] Date of Birth: 1940/11/13  Today's Date: 02/10/2012 Time: 7829-5621 OT Time Calculation (min): 53 min  Manual Therapy: 3086-5784 25' Therapeutic Exercise: 1450-1518 28' Visit#: 8  of 12   Re-eval: 02/16/12    Subjective Symptoms/Limitations Symptoms: S: The new exercises last time made me so tired I took a 4 hour nap. Pain Assessment Currently in Pain?:  (Pt reports no pain but mild tightness)  Precautions/Restrictions   Avoid combined sh abd/ext rot for 3 months post dislocation  Exercise/Treatments Supine Protraction: PROM;10 reps;Strengthening;12 reps Protraction Weight (lbs): 1# Horizontal ABduction: PROM;10 reps;Strengthening;12 reps Horizontal ABduction Weight (lbs): 1# External Rotation: PROM;10 reps;Strengthening;12 reps External Rotation Weight (lbs): 1# Internal Rotation: PROM;10 reps;Strengthening;12 reps Internal Rotation Weight (lbs): 1# Flexion: PROM;10 reps;Strengthening;12 reps Shoulder Flexion Weight (lbs): 1# Seated Elevation: AROM;15 reps Protraction: Strengthening;12 reps Protraction Weight (lbs): 1# Horizontal ABduction: Strengthening;12 reps Horizontal ABduction Weight (lbs): 1# External Rotation: Strengthening;12 reps External Rotation Weight (lbs): 1# Internal Rotation: Strengthening;12 reps Internal Rotation Weight (lbs): 1# Flexion: Strengthening;12 reps Flexion Weight (lbs): 1# Abduction: Strengthening;12 reps ABduction Weight (lbs): 1#   Standing Extension: Theraband;12 reps Theraband Level (Shoulder Extension): Level 2 (Red) Row: Theraband;12 reps Theraband Level (Shoulder Row): Level 2 (Red) Retraction: Theraband;12 reps Theraband Level (Shoulder Retraction): Level 2 (Red)   ROM / Strengthening / Isometric Strengthening UBE (Upper Arm Bike): 3' and 3' @ 2.0 Cybex Press: 1 plate;Other (comment) (12 reps) Cybex Row: 1 plate;Other (comment) (12  reps) Wall Wash: 2' with 1# (lessened duration due to time constraint.) Over Head Lace: 1'        Manual Therapy Manual Therapy: Myofascial release Myofascial Release: MFR and manual stretching to right scapular, trapezius, shoulder and upper arm regions to decrease pain and restrictions and increase pain free P/AROM.  Occupational Therapy Assessment and Plan OT Assessment and Plan Clinical Impression Statement: A: Patient required vc and pa for proper positioning and pace during supine/seated strengthening and theraband exercises. Added overhead lace this visit which pt tolerated well but reported shoulder fatigue. OT Plan: P: Reassess. Increase time of overhead lace.   Goals Short Term Goals Time to Complete Short Term Goals: 3 weeks Short Term Goal 1: Patient will be educated on HEP. Short Term Goal 1 Progress: Progressing toward goal Short Term Goal 2: Patient will improve R shoulder PROM to [facility] in order to increase independence with fastening her bra. Short Term Goal 2 Progress: Progressing toward goal Short Term Goal 3: Patient will increase R shoulder AROM to within 90% of normal range to increase independence with mopping. Short Term Goal 3 Progress: Progressing toward goal Short Term Goal 4: Patient will decrease fascial restrictions from mod to min-mod in right shoulder region. Short Term Goal 4 Progress: Progressing toward goal Short Term Goal 5: Patient will decrease pain in right shoulder to 4/10 when cleaning the house. Short Term Goal 5 Progress: Progressing toward goal Long Term Goals Long Term Goal 1: Patient will return to PLOF with all B/IADL and leisure tasks. Long Term Goal 1 Progress: Progressing toward goal Long Term Goal 2: Patient will increase R shoulder AROM to WNL in order to increase independence with retrieving items from overhead cabinets/shelves in her kitchen. Long Term Goal 2 Progress: Progressing toward goal Long Term Goal 3: Patient will increase R  shoulder strength to 5/5 to increase independence with vacuuming.  Long Term Goal 3 Progress: Progressing toward goal Long Term Goal 4: Patient will  decrease fascial restrictions to trace in her right shoulder region. Long Term Goal 4 Progress: Progressing toward goal Long Term Goal 5: Patient will decrease pain to 1/10 throughout the day when performing her normal activities.  Long Term Goal 5 Progress: Progressing toward goal  Problem List Patient Active Problem List  Diagnosis  . Carcinoma of breast, stage 2, estrogen receptor negative, right  . Hemangioma of liver  . Neuropathy, peripheral  . Diverticulosis of colon without diverticulitis  . GERD (gastroesophageal reflux disease)  . Cystadenofibroma of ovary, unspecified laterality  . Closed dislocation of shoulder, unspecified site    End of Session Activity Tolerance: Patient tolerated treatment well General Behavior During Session: [facility] for tasks performed Cognition: [facility] for tasks performed  GO   Laverta Baltimore, OTS Occupational Therapy Student  02/10/2012, 3:21 PM

## 2012-02-10 NOTE — Progress Notes (Signed)
Note reviewed by clinical instructor and accurately reflects treatment session.  Molly Bauer H. Molly Black, OTR/L  

## 2012-02-11 ENCOUNTER — Ambulatory Visit (HOSPITAL_COMMUNITY): Payer: Medicare Other | Admitting: Specialist

## 2012-02-16 ENCOUNTER — Ambulatory Visit (HOSPITAL_COMMUNITY)
Admission: RE | Admit: 2012-02-16 | Discharge: 2012-02-16 | Disposition: A | Discharge status: Home / Self Care | Payer: Medicare Other | Source: Ambulatory Visit | Attending: Occupational Therapy | Admitting: Occupational Therapy

## 2012-02-16 DIAGNOSIS — S43006A Unspecified dislocation of unspecified shoulder joint, initial encounter: Secondary | ICD-10-CM

## 2012-02-16 NOTE — Progress Notes (Signed)
Occupational Therapy Treatment Patient Details  Name: Molly Black MRN: [id number] Date of Birth: Dec 12, 1940  Today's Date: 02/16/2012 Time: 7829-5621 OT Time Calculation (min): 30 min Manual Therapy 3086-5784 16' Reassessment (completed by OTS) 1202-1215 13'  Visit#: 9  of 12   Re-eval: 02/16/12 Assessment Diagnosis: Right shoulder dislocation  Subjective Symptoms/Limitations Symptoms: S:  I started back at the pool. Pain Assessment Currently in Pain?: No/denies Pain Score: 0-No pain  Precautions/Restrictions     Exercise/Treatments Supine Protraction: PROM;10 reps Horizontal ABduction: PROM;10 reps External Rotation: PROM;10 reps Internal Rotation: PROM;10 reps Flexion: PROM;10 reps ABduction: PROM;10 reps Seated Protraction: Strengthening;12 reps Protraction Weight (lbs): 1# Horizontal ABduction: Strengthening;12 reps Horizontal ABduction Weight (lbs): 1# External Rotation: Strengthening;12 reps External Rotation Weight (lbs): 1# Internal Rotation: Strengthening;12 reps Internal Rotation Weight (lbs): 1# Flexion: Strengthening;12 reps Flexion Weight (lbs): 1# Abduction: Strengthening;12 reps ABduction Weight (lbs): 1#           Manual Therapy Manual Therapy: Myofascial release Myofascial Release: MFR and manual stretching to right scapular, trapezius, shoulder and upper arm regions to decrease pain and restrictions and increase pain free P/AROM  Occupational Therapy Assessment and Plan OT Assessment and Plan Clinical Impression Statement: A:  See progress note.  Decided not to add tband to hep secondary to patient's increased pace with reps and poor form with ex.  Patient states she goes to the pool to exercise 3x a week and [facility]  Educated patient to begin with low (1 plate on machines and  2# in the water and increase her weight as her reps become easier and if anything hurts to back down one level. OT Plan: P:   D/C to home program of stretches and patient will continue to increase strenght and functional endurance at pool and Y.   Goals Short Term Goals Time to Complete Short Term Goals: 3 weeks Short Term Goal 1: Patient will be educated on HEP. Short Term Goal 2: Patient will improve R shoulder PROM to [facility] in order to increase independence with fastening her bra. Short Term Goal 3: Patient will increase R shoulder AROM to within 90% of normal range to increase independence with mopping. Short Term Goal 4: Patient will decrease fascial restrictions from mod to min-mod in right shoulder region. Short Term Goal 5: Patient will decrease pain in right shoulder to 4/10 when cleaning the house. Long Term Goals Long Term Goal 1: Patient will return to PLOF with all B/IADL and leisure tasks. Long Term Goal 2: Patient will increase R shoulder AROM to WNL in order to increase independence with retrieving items from overhead cabinets/shelves in her kitchen. Long Term Goal 3: Patient will increase R shoulder strength to 5/5 to increase independence with vacuuming.  Long Term Goal 4: Patient will decrease fascial restrictions to trace in her right shoulder region. Long Term Goal 5: Patient will decrease pain to 1/10 throughout the day when performing her normal activities.   Problem List Patient Active Problem List  Diagnosis  . Carcinoma of breast, stage 2, estrogen receptor negative, right  . Hemangioma of liver  . Neuropathy, peripheral  . Diverticulosis of colon without diverticulitis  . GERD (gastroesophageal reflux disease)  . Cystadenofibroma of ovary, unspecified laterality  . Closed dislocation of shoulder, unspecified site    End of Session Activity Tolerance: Patient tolerated treatment well General Behavior During Session: [facility] for tasks performed Cognition: [facility] for tasks performed  GO    Noralee Stain, Laura-Lee Villegas  L 02/16/2012, 6:07 PM

## 2012-02-22 ENCOUNTER — Ambulatory Visit (HOSPITAL_COMMUNITY): Payer: Medicare Other | Admitting: Occupational Therapy

## 2012-03-01 ENCOUNTER — Ambulatory Visit (HOSPITAL_COMMUNITY): Payer: Medicare Other | Admitting: Occupational Therapy

## 2012-03-01 ENCOUNTER — Ambulatory Visit: Payer: Medicare Other | Admitting: Orthopedic Surgery

## 2012-03-03 ENCOUNTER — Encounter: Payer: Self-pay | Admitting: Orthopedic Surgery

## 2012-03-03 ENCOUNTER — Ambulatory Visit (HOSPITAL_COMMUNITY): Payer: Medicare Other | Admitting: Specialist

## 2012-03-03 ENCOUNTER — Ambulatory Visit (INDEPENDENT_AMBULATORY_CARE_PROVIDER_SITE_OTHER): Payer: Medicare Other | Admitting: Orthopedic Surgery

## 2012-03-03 VITALS — BP 108/68 | Ht 63.0 in | Wt 161.0 lb

## 2012-03-03 DIAGNOSIS — S43006A Unspecified dislocation of unspecified shoulder joint, initial encounter: Secondary | ICD-10-CM

## 2012-03-03 NOTE — Progress Notes (Signed)
Patient ID: Molly Black [facility], female   DOB: 08/21/1940, 71 y.o.   MRN: [id number] Chief Complaint  Patient presents with  . Follow-up    recheck Right shoulder, DOI 01/04/12    BP 108/68  Ht 5\' 3"  (1.6 m)  Wt 161 lb (73.029 kg)  BMI 28.52 kg/m2  Visit for impingement syndrome of the RIGHT shoulder. She completed her occupational therapy did well, has minimal symptoms, mainly at night when she rolls over onto the RIGHT side. She has full forward elevation, and normal strength.  Impression resolved rotator cuff syndrome, followup as needed

## 2012-03-07 ENCOUNTER — Ambulatory Visit (HOSPITAL_COMMUNITY): Payer: Medicare Other | Admitting: Specialist

## 2012-03-10 ENCOUNTER — Ambulatory Visit (HOSPITAL_COMMUNITY): Payer: Medicare Other | Admitting: Specialist

## 2012-03-15 ENCOUNTER — Ambulatory Visit (HOSPITAL_COMMUNITY): Payer: Medicare Other | Admitting: Specialist

## 2012-03-17 ENCOUNTER — Ambulatory Visit (HOSPITAL_COMMUNITY): Payer: Medicare Other | Admitting: Specialist

## 2012-10-11 ENCOUNTER — Encounter (HOSPITAL_COMMUNITY): Payer: Self-pay | Admitting: Pharmacy Technician

## 2012-10-11 ENCOUNTER — Inpatient Hospital Stay (HOSPITAL_COMMUNITY): Admission: RE | Admit: 2012-10-11 | Payer: Medicare Other | Source: Ambulatory Visit

## 2012-10-12 ENCOUNTER — Inpatient Hospital Stay (HOSPITAL_COMMUNITY): Admission: RE | Admit: 2012-10-12 | Payer: Medicare Other | Source: Ambulatory Visit

## 2012-10-12 NOTE — Patient Instructions (Addendum)
Your procedure is scheduled on: 10/18/2012  Report to [facility] at   630    AM.  Call this number if you have problems the morning of surgery: [phone]   Do not eat food or drink liquids :After Midnight.      Take these medicines the morning of surgery with A SIP OF WATER: nexium   Do not wear jewelry, make-up or nail polish.  Do not wear lotions, powders, or perfumes.   Do not shave 48 hours prior to surgery.  Do not bring valuables to the hospital.  Contacts, dentures or bridgework may not be worn into surgery.  Leave suitcase in the car. After surgery it may be brought to your room.  For patients admitted to the hospital, checkout time is 11:00 AM the day of discharge.   Patients discharged the day of surgery will not be allowed to drive home.  :     Please read over the following fact sheets that you were given: Coughing and Deep Breathing, Surgical Site Infection Prevention, Anesthesia Post-op Instructions and Care and Recovery After Surgery    Cataract A cataract is a clouding of the lens of the eye. When a lens becomes cloudy, vision is reduced based on the degree and nature of the clouding. Many cataracts reduce vision to some degree. Some cataracts make people more near-sighted as they develop. Other cataracts increase glare. Cataracts that are ignored and become worse can sometimes look white. The white color can be seen through the pupil. CAUSES   Aging. However, cataracts may occur at any age, even in newborns.   Certain drugs.   Trauma to the eye.   Certain diseases such as diabetes.   Specific eye diseases such as chronic inflammation inside the eye or a sudden attack of a rare form of glaucoma.   Inherited or acquired medical problems.  SYMPTOMS   Gradual, progressive drop in vision in the affected eye.   Severe, rapid visual loss. This most often happens when trauma is the cause.  DIAGNOSIS  To detect a cataract, an eye doctor examines the lens. Cataracts  are best diagnosed with an exam of the eyes with the pupils enlarged (dilated) by drops.  TREATMENT  For an early cataract, vision may improve by using different eyeglasses or stronger lighting. If that does not help your vision, surgery is the only effective treatment. A cataract needs to be surgically removed when vision loss interferes with your everyday activities, such as driving, reading, or watching TV. A cataract may also have to be removed if it prevents examination or treatment of another eye problem. Surgery removes the cloudy lens and usually replaces it with a substitute lens (intraocular lens, IOL).  At a time when both you and your doctor agree, the cataract will be surgically removed. If you have cataracts in both eyes, only one is usually removed at a time. This allows the operated eye to heal and be out of danger from any possible problems after surgery (such as infection or poor wound healing). In rare cases, a cataract may be doing damage to your eye. In these cases, your caregiver may advise surgical removal right away. The vast majority of people who have cataract surgery have better vision afterward. HOME CARE INSTRUCTIONS  If you are not planning surgery, you may be asked to do the following:  Use different eyeglasses.   Use stronger or brighter lighting.   Ask your eye doctor about reducing your medicine dose or  changing medicines if it is thought that a medicine caused your cataract. Changing medicines does not make the cataract go away on its own.   Become familiar with your surroundings. Poor vision can lead to injury. Avoid bumping into things on the affected side. You are at a higher risk for tripping or falling.   Exercise extreme care when driving or operating machinery.   Wear sunglasses if you are sensitive to bright light or experiencing problems with glare.  SEEK IMMEDIATE MEDICAL CARE IF:   You have a worsening or sudden vision loss.   You notice redness,  swelling, or increasing pain in the eye.   You have a fever.  Document Released: 07/13/2005 Document Revised: 07/02/2011 Document Reviewed: 03/06/2011 [facility] Patient Information 2012 [place].PATIENT INSTRUCTIONS POST-ANESTHESIA  IMMEDIATELY FOLLOWING SURGERY:  Do not drive or operate machinery for the first twenty four hours after surgery.  Do not make any important decisions for twenty four hours after surgery or while taking narcotic pain medications or sedatives.  If you develop intractable nausea and vomiting or a severe headache please notify your doctor immediately.  FOLLOW-UP:  Please make an appointment with your surgeon as instructed. You do not need to follow up with anesthesia unless specifically instructed to do so.  WOUND CARE INSTRUCTIONS (if applicable):  Keep a dry clean dressing on the anesthesia/puncture wound site if there is drainage.  Once the wound has quit draining you may leave it open to air.  Generally you should leave the bandage intact for twenty four hours unless there is drainage.  If the epidural site drains for more than 36-48 hours please call the anesthesia department.  QUESTIONS?:  Please feel free to call your physician or the hospital operator if you have any questions, and they will be happy to assist you.

## 2012-10-13 ENCOUNTER — Other Ambulatory Visit: Payer: Self-pay

## 2012-10-13 ENCOUNTER — Encounter (HOSPITAL_COMMUNITY)
Admission: RE | Admit: 2012-10-13 | Discharge: 2012-10-13 | Disposition: A | Discharge status: Home / Self Care | Payer: Medicare Other | Source: Ambulatory Visit | Attending: Ophthalmology | Admitting: Ophthalmology

## 2012-10-13 ENCOUNTER — Encounter (HOSPITAL_COMMUNITY): Payer: Self-pay

## 2012-10-13 HISTORY — DX: Other specified postprocedural states: Z98.890

## 2012-10-13 HISTORY — DX: Shortness of breath: R06.02

## 2012-10-13 HISTORY — DX: Nausea with vomiting, unspecified: R11.2

## 2012-10-13 LAB — BASIC METABOLIC PANEL
CO2: 25 mEq/L (ref 19–32)
Chloride: 105 mEq/L (ref 96–112)
Creatinine, Ser: 0.68 mg/dL (ref 0.50–1.10)

## 2012-10-13 LAB — HEMOGLOBIN AND HEMATOCRIT, BLOOD: HCT: 39.9 % (ref 36.0–46.0)

## 2012-10-17 MED ORDER — TETRACAINE HCL 0.5 % OP SOLN
OPHTHALMIC | Status: AC
  Filled 2012-10-17: qty 2

## 2012-10-17 MED ORDER — PHENYLEPHRINE HCL 2.5 % OP SOLN
OPHTHALMIC | Status: AC
  Filled 2012-10-17: qty 2

## 2012-10-17 MED ORDER — FLURBIPROFEN SODIUM 0.03 % OP SOLN
OPHTHALMIC | Status: AC
  Filled 2012-10-17: qty 2.5

## 2012-10-17 MED ORDER — CYCLOPENTOLATE-PHENYLEPHRINE 0.2-1 % OP SOLN
OPHTHALMIC | Status: AC
  Filled 2012-10-17: qty 2

## 2012-10-18 ENCOUNTER — Encounter (HOSPITAL_COMMUNITY): Payer: Self-pay | Admitting: Anesthesiology

## 2012-10-18 ENCOUNTER — Ambulatory Visit (HOSPITAL_COMMUNITY): Payer: Medicare Other | Admitting: Anesthesiology

## 2012-10-18 ENCOUNTER — Encounter (HOSPITAL_COMMUNITY)
Admission: RE | Disposition: A | Discharge status: Home / Self Care | Payer: Self-pay | Source: Ambulatory Visit | Attending: Ophthalmology

## 2012-10-18 ENCOUNTER — Encounter (HOSPITAL_COMMUNITY): Payer: Self-pay | Admitting: *Deleted

## 2012-10-18 ENCOUNTER — Ambulatory Visit (HOSPITAL_COMMUNITY)
Admission: RE | Admit: 2012-10-18 | Discharge: 2012-10-18 | Disposition: A | Discharge status: Home / Self Care | Payer: Medicare Other | Source: Ambulatory Visit | Attending: Ophthalmology | Admitting: Ophthalmology

## 2012-10-18 DIAGNOSIS — K219 Gastro-esophageal reflux disease without esophagitis: Secondary | ICD-10-CM | POA: Insufficient documentation

## 2012-10-18 DIAGNOSIS — M129 Arthropathy, unspecified: Secondary | ICD-10-CM | POA: Insufficient documentation

## 2012-10-18 DIAGNOSIS — Z79899 Other long term (current) drug therapy: Secondary | ICD-10-CM | POA: Insufficient documentation

## 2012-10-18 DIAGNOSIS — G4730 Sleep apnea, unspecified: Secondary | ICD-10-CM | POA: Insufficient documentation

## 2012-10-18 DIAGNOSIS — G709 Myoneural disorder, unspecified: Secondary | ICD-10-CM | POA: Insufficient documentation

## 2012-10-18 DIAGNOSIS — Z853 Personal history of malignant neoplasm of breast: Secondary | ICD-10-CM | POA: Insufficient documentation

## 2012-10-18 DIAGNOSIS — H269 Unspecified cataract: Secondary | ICD-10-CM | POA: Insufficient documentation

## 2012-10-18 HISTORY — PX: CATARACT EXTRACTION W/PHACO: SHX586

## 2012-10-18 SURGERY — PHACOEMULSIFICATION, CATARACT, WITH IOL INSERTION
Anesthesia: Monitor Anesthesia Care | Site: Eye | Laterality: Right | Wound class: Clean

## 2012-10-18 MED ORDER — PHENYLEPHRINE HCL 2.5 % OP SOLN
1.0000 [drp] | OPHTHALMIC | Status: AC
  Administered 2012-10-18 (×3): 1 [drp] via OPHTHALMIC

## 2012-10-18 MED ORDER — LACTATED RINGERS IV SOLN
INTRAVENOUS | Status: DC
  Administered 2012-10-18: 08:00:00 via INTRAVENOUS

## 2012-10-18 MED ORDER — FLURBIPROFEN SODIUM 0.03 % OP SOLN
1.0000 [drp] | OPHTHALMIC | Status: AC
  Administered 2012-10-18 (×3): 1 [drp] via OPHTHALMIC

## 2012-10-18 MED ORDER — BSS IO SOLN
INTRAOCULAR | Status: DC | PRN
  Administered 2012-10-18: 15 mL via INTRAOCULAR

## 2012-10-18 MED ORDER — PROVISC 10 MG/ML IO SOLN
INTRAOCULAR | Status: DC | PRN
  Administered 2012-10-18: 8.5 mg via INTRAOCULAR

## 2012-10-18 MED ORDER — ONDANSETRON HCL 4 MG/2ML IJ SOLN: 4.0000 mg | Freq: Once | INTRAMUSCULAR | Status: DC | PRN

## 2012-10-18 MED ORDER — MIDAZOLAM HCL 2 MG/2ML IJ SOLN
1.0000 mg | INTRAMUSCULAR | Status: DC | PRN
  Administered 2012-10-18: 2 mg via INTRAVENOUS

## 2012-10-18 MED ORDER — MIDAZOLAM HCL 2 MG/2ML IJ SOLN
INTRAMUSCULAR | Status: AC
  Filled 2012-10-18: qty 2

## 2012-10-18 MED ORDER — FENTANYL CITRATE 0.05 MG/ML IJ SOLN: 25.0000 ug | INTRAMUSCULAR | Status: DC | PRN

## 2012-10-18 MED ORDER — TETRACAINE 0.5 % OP SOLN OPTIME - NO CHARGE
OPHTHALMIC | Status: DC | PRN
  Administered 2012-10-18: 1 [drp] via OPHTHALMIC

## 2012-10-18 MED ORDER — CYCLOPENTOLATE-PHENYLEPHRINE 0.2-1 % OP SOLN
1.0000 [drp] | OPHTHALMIC | Status: AC
  Administered 2012-10-18 (×3): 1 [drp] via OPHTHALMIC

## 2012-10-18 MED ORDER — EPINEPHRINE HCL 1 MG/ML IJ SOLN
INTRAOCULAR | Status: DC | PRN
  Administered 2012-10-18: 08:00:00

## 2012-10-18 MED ORDER — KETOROLAC TROMETHAMINE 0.5 % OP SOLN: 1.0000 [drp] | OPHTHALMIC | Status: AC

## 2012-10-18 MED ORDER — TETRACAINE HCL 0.5 % OP SOLN
1.0000 [drp] | OPHTHALMIC | Status: AC
  Administered 2012-10-18 (×3): 1 [drp] via OPHTHALMIC

## 2012-10-18 MED ORDER — EPINEPHRINE HCL 1 MG/ML IJ SOLN
INTRAMUSCULAR | Status: AC
  Filled 2012-10-18: qty 1

## 2012-10-18 SURGICAL SUPPLY — 23 items
CAPSULAR TENSION RING-AMO (OPHTHALMIC RELATED) IMPLANT
CLOTH BEACON ORANGE TIMEOUT ST (SAFETY) ×1 IMPLANT
EYE SHIELD UNIVERSAL CLEAR (GAUZE/BANDAGES/DRESSINGS) ×1 IMPLANT
GLOVE BIO SURGEON STRL SZ 6.5 (GLOVE) ×1 IMPLANT
GLOVE ECLIPSE 6.5 STRL STRAW (GLOVE) IMPLANT
GLOVE ECLIPSE 7.0 STRL STRAW (GLOVE) IMPLANT
GLOVE EXAM NITRILE LRG STRL (GLOVE) IMPLANT
GLOVE EXAM NITRILE MD LF STRL (GLOVE) ×1 IMPLANT
GLOVE SKINSENSE NS SZ6.5 (GLOVE)
GLOVE SKINSENSE STRL SZ6.5 (GLOVE) IMPLANT
HEALON 5 0.6 ML (INTRAOCULAR LENS) IMPLANT
KIT VITRECTOMY (OPHTHALMIC RELATED) IMPLANT
LENS IOL ACRYSOF IQ TORIC 15.0 ×1 IMPLANT
PAD ARMBOARD 7.5X6 YLW CONV (MISCELLANEOUS) ×1 IMPLANT
PROC W NO LENS (INTRAOCULAR LENS)
PROC W SPEC LENS (INTRAOCULAR LENS) ×2
PROCESS W NO LENS (INTRAOCULAR LENS) IMPLANT
PROCESS W SPEC LENS (INTRAOCULAR LENS) IMPLANT
RING MALYGIN (MISCELLANEOUS) IMPLANT
TAPE SURG TRANSPORE 1 IN (GAUZE/BANDAGES/DRESSINGS) IMPLANT
TAPE SURGICAL TRANSPORE 1 IN (GAUZE/BANDAGES/DRESSINGS) ×1
VISCOELASTIC ADDITIONAL (OPHTHALMIC RELATED) IMPLANT
WATER STERILE IRR 250ML POUR (IV SOLUTION) ×1 IMPLANT

## 2012-10-18 NOTE — Transfer of Care (Signed)
Immediate Anesthesia Transfer of Care Note  Patient: Molly Black [facility]  Procedure(s) Performed: Procedure(s) with comments: CATARACT EXTRACTION PHACO AND INTRAOCULAR LENS PLACEMENT (IOC) (Right) - CDE:7.67  Patient Location: Short Stay  Anesthesia Type:MAC  Level of Consciousness: awake, alert , oriented and patient cooperative  Airway & Oxygen Therapy: Patient Spontanous Breathing  Post-op Assessment: Report given to PACU RN, Post -op Vital signs reviewed and stable and Patient moving all extremities  Post vital signs: Reviewed and stable  Complications: No apparent anesthesia complications

## 2012-10-18 NOTE — Anesthesia Preprocedure Evaluation (Signed)
Anesthesia Evaluation  Patient identified by MRN, date of birth, ID band Patient awake    Reviewed: Allergy & Precautions, H&P , NPO status , Patient's Chart, lab work & pertinent test results  History of Anesthesia Complications (+) PONV  Airway Mallampati: II TM Distance: >3 FB     Dental  (+) Teeth Intact and Implants   Pulmonary shortness of breath, sleep apnea and Continuous Positive Airway Pressure Ventilation ,  breath sounds clear to auscultation        Cardiovascular negative cardio ROS  Rhythm:Regular Rate:Normal     Neuro/Psych  Neuromuscular disease    GI/Hepatic GERD-  Medicated and Controlled,  Endo/Other    Renal/GU      Musculoskeletal   Abdominal   Peds  Hematology   Anesthesia Other Findings   Reproductive/Obstetrics                           Anesthesia Physical Anesthesia Plan  ASA: III  Anesthesia Plan: MAC   Post-op Pain Management:    Induction: Intravenous  Airway Management Planned: Nasal Cannula  Additional Equipment:   Intra-op Plan:   Post-operative Plan:   Informed Consent: I have reviewed the patients History and Physical, chart, labs and discussed the procedure including the risks, benefits and alternatives for the proposed anesthesia with the patient or authorized representative who has indicated his/her understanding and acceptance.     Plan Discussed with:   Anesthesia Plan Comments:         Anesthesia Quick Evaluation  

## 2012-10-18 NOTE — H&P (Signed)
The patient was re examined and there is no change in the patients condition since the original H and P. 

## 2012-10-18 NOTE — Anesthesia Postprocedure Evaluation (Signed)
  Anesthesia Post-op Note  Patient: Molly Black [facility]  Procedure(s) Performed: Procedure(s) with comments: CATARACT EXTRACTION PHACO AND INTRAOCULAR LENS PLACEMENT (IOC) (Right) - CDE:7.67  Patient Location: Short Stay  Anesthesia Type:MAC  Level of Consciousness: awake, alert , oriented and patient cooperative  Airway and Oxygen Therapy: Patient Spontanous Breathing  Post-op Pain: none  Post-op Assessment: Post-op Vital signs reviewed, Patient's Cardiovascular Status Stable, Respiratory Function Stable, RESPIRATORY FUNCTION UNSTABLE, No signs of Nausea or vomiting and Pain level controlled  Post-op Vital Signs: Reviewed and stable  Complications: No apparent anesthesia complications

## 2012-10-18 NOTE — Op Note (Signed)
Patient brought to the operating room and prepped and draped in the usual manner.  Lid speculum inserted in right eye.  Stab incision made at the twelve o'clock position.  Provisc instilled in the anterior chamber.   A 2.4 mm. Stab incision was made temporally.  An anterior capsulotomy was done with a bent 25 gauge needle.  The nucleus was hydrodissected.  The Phaco tip was inserted in the anterior chamber and the nucleus was emulsified.  CDE was 7.67.  The cortical material was then removed with the I and A tip.  Posterior capsule was the polished.  The anterior chamber was deepened with Provisc.  A 25.0 Alcon [id number] Toric IOL was then inserted in the capsular bag. The Toric IOL was placed at the 90 degree position.  Provisc was then removed with the I and A tip.  The wound was then hydrated.  Patient sent to the Recovery Room in good condition with follow up in my office.

## 2012-10-20 ENCOUNTER — Encounter (HOSPITAL_COMMUNITY): Payer: Self-pay | Admitting: Ophthalmology

## 2012-10-21 ENCOUNTER — Encounter (HOSPITAL_COMMUNITY): Payer: Self-pay | Admitting: Pharmacy Technician

## 2012-10-26 ENCOUNTER — Encounter (HOSPITAL_COMMUNITY): Payer: Self-pay

## 2012-10-26 ENCOUNTER — Encounter (HOSPITAL_COMMUNITY)
Admission: RE | Admit: 2012-10-26 | Discharge: 2012-10-26 | Disposition: A | Discharge status: Home / Self Care | Payer: Medicare Other | Source: Ambulatory Visit | Attending: Ophthalmology | Admitting: Ophthalmology

## 2012-11-01 ENCOUNTER — Ambulatory Visit (HOSPITAL_COMMUNITY)
Admission: RE | Admit: 2012-11-01 | Discharge: 2012-11-01 | Disposition: A | Discharge status: Home / Self Care | Payer: Medicare Other | Source: Ambulatory Visit | Attending: Ophthalmology | Admitting: Ophthalmology

## 2012-11-01 ENCOUNTER — Encounter (HOSPITAL_COMMUNITY): Payer: Self-pay | Admitting: Anesthesiology

## 2012-11-01 ENCOUNTER — Ambulatory Visit (HOSPITAL_COMMUNITY): Payer: Medicare Other | Admitting: Anesthesiology

## 2012-11-01 ENCOUNTER — Encounter (HOSPITAL_COMMUNITY): Payer: Self-pay

## 2012-11-01 ENCOUNTER — Encounter (HOSPITAL_COMMUNITY)
Admission: RE | Disposition: A | Discharge status: Home / Self Care | Payer: Self-pay | Source: Ambulatory Visit | Attending: Ophthalmology

## 2012-11-01 DIAGNOSIS — M129 Arthropathy, unspecified: Secondary | ICD-10-CM | POA: Insufficient documentation

## 2012-11-01 DIAGNOSIS — K219 Gastro-esophageal reflux disease without esophagitis: Secondary | ICD-10-CM | POA: Insufficient documentation

## 2012-11-01 DIAGNOSIS — Z853 Personal history of malignant neoplasm of breast: Secondary | ICD-10-CM | POA: Insufficient documentation

## 2012-11-01 DIAGNOSIS — H269 Unspecified cataract: Secondary | ICD-10-CM | POA: Insufficient documentation

## 2012-11-01 DIAGNOSIS — J309 Allergic rhinitis, unspecified: Secondary | ICD-10-CM | POA: Insufficient documentation

## 2012-11-01 DIAGNOSIS — Z79899 Other long term (current) drug therapy: Secondary | ICD-10-CM | POA: Insufficient documentation

## 2012-11-01 HISTORY — PX: CATARACT EXTRACTION W/PHACO: SHX586

## 2012-11-01 SURGERY — PHACOEMULSIFICATION, CATARACT, WITH IOL INSERTION
Anesthesia: Monitor Anesthesia Care | Site: Eye | Laterality: Left | Wound class: Clean

## 2012-11-01 MED ORDER — TETRACAINE HCL 0.5 % OP SOLN
1.0000 [drp] | OPHTHALMIC | Status: AC
  Administered 2012-11-01 (×3): 1 [drp] via OPHTHALMIC

## 2012-11-01 MED ORDER — ONDANSETRON HCL 4 MG/2ML IJ SOLN: 4.0000 mg | Freq: Once | INTRAMUSCULAR | Status: DC | PRN

## 2012-11-01 MED ORDER — FENTANYL CITRATE 0.05 MG/ML IJ SOLN: 25.0000 ug | INTRAMUSCULAR | Status: DC | PRN

## 2012-11-01 MED ORDER — TETRACAINE 0.5 % OP SOLN OPTIME - NO CHARGE
OPHTHALMIC | Status: DC | PRN
  Administered 2012-11-01: 1 [drp] via OPHTHALMIC

## 2012-11-01 MED ORDER — EPINEPHRINE HCL 1 MG/ML IJ SOLN
INTRAOCULAR | Status: DC | PRN
  Administered 2012-11-01: 08:00:00

## 2012-11-01 MED ORDER — LIDOCAINE HCL (PF) 1 % IJ SOLN
INTRAMUSCULAR | Status: AC
  Filled 2012-11-01: qty 2

## 2012-11-01 MED ORDER — FLURBIPROFEN SODIUM 0.03 % OP SOLN
1.0000 [drp] | OPHTHALMIC | Status: DC
  Administered 2012-11-01 (×3): 1 [drp] via OPHTHALMIC

## 2012-11-01 MED ORDER — LACTATED RINGERS IV SOLN
INTRAVENOUS | Status: DC
  Administered 2012-11-01: 08:00:00 via INTRAVENOUS

## 2012-11-01 MED ORDER — PHENYLEPHRINE HCL 2.5 % OP SOLN
OPHTHALMIC | Status: AC
  Filled 2012-11-01: qty 2

## 2012-11-01 MED ORDER — BSS IO SOLN
INTRAOCULAR | Status: DC | PRN
  Administered 2012-11-01: 15 mL via INTRAOCULAR

## 2012-11-01 MED ORDER — KETOROLAC TROMETHAMINE 0.5 % OP SOLN: 1.0000 [drp] | OPHTHALMIC | Status: AC

## 2012-11-01 MED ORDER — CYCLOPENTOLATE-PHENYLEPHRINE 0.2-1 % OP SOLN
OPHTHALMIC | Status: AC
  Filled 2012-11-01: qty 2

## 2012-11-01 MED ORDER — PHENYLEPHRINE HCL 2.5 % OP SOLN
1.0000 [drp] | OPHTHALMIC | Status: AC
  Administered 2012-11-01 (×3): 1 [drp] via OPHTHALMIC

## 2012-11-01 MED ORDER — MIDAZOLAM HCL 2 MG/2ML IJ SOLN
1.0000 mg | INTRAMUSCULAR | Status: DC | PRN
  Administered 2012-11-01: 2 mg via INTRAVENOUS

## 2012-11-01 MED ORDER — MIDAZOLAM HCL 2 MG/2ML IJ SOLN
INTRAMUSCULAR | Status: AC
  Filled 2012-11-01: qty 2

## 2012-11-01 MED ORDER — FLURBIPROFEN SODIUM 0.03 % OP SOLN
OPHTHALMIC | Status: AC
  Filled 2012-11-01: qty 2.5

## 2012-11-01 MED ORDER — CYCLOPENTOLATE-PHENYLEPHRINE 0.2-1 % OP SOLN
1.0000 [drp] | OPHTHALMIC | Status: AC
  Administered 2012-11-01 (×3): 1 [drp] via OPHTHALMIC

## 2012-11-01 MED ORDER — TETRACAINE HCL 0.5 % OP SOLN
OPHTHALMIC | Status: AC
  Filled 2012-11-01: qty 2

## 2012-11-01 MED ORDER — PROVISC 10 MG/ML IO SOLN
INTRAOCULAR | Status: DC | PRN
  Administered 2012-11-01: 8.5 mg via INTRAOCULAR

## 2012-11-01 SURGICAL SUPPLY — 25 items
CAPSULAR TENSION RING-AMO (OPHTHALMIC RELATED) IMPLANT
CLOTH BEACON ORANGE TIMEOUT ST (SAFETY) ×1 IMPLANT
EYE SHIELD UNIVERSAL CLEAR (GAUZE/BANDAGES/DRESSINGS) ×1 IMPLANT
GLOVE BIO SURGEON STRL SZ 6.5 (GLOVE) IMPLANT
GLOVE BIOGEL PI IND STRL 6.5 (GLOVE) IMPLANT
GLOVE BIOGEL PI INDICATOR 6.5 (GLOVE) ×1
GLOVE ECLIPSE 6.5 STRL STRAW (GLOVE) IMPLANT
GLOVE ECLIPSE 7.0 STRL STRAW (GLOVE) IMPLANT
GLOVE EXAM NITRILE LRG STRL (GLOVE) IMPLANT
GLOVE EXAM NITRILE MD LF STRL (GLOVE) ×1 IMPLANT
GLOVE SKINSENSE NS SZ6.5 (GLOVE)
GLOVE SKINSENSE STRL SZ6.5 (GLOVE) IMPLANT
HEALON 5 0.6 ML (INTRAOCULAR LENS) IMPLANT
KIT VITRECTOMY (OPHTHALMIC RELATED) IMPLANT
LENS IOL ACRYSOF IQ TORIC 15.0 ×1 IMPLANT
PAD ARMBOARD 7.5X6 YLW CONV (MISCELLANEOUS) ×1 IMPLANT
PROC W NO LENS (INTRAOCULAR LENS)
PROC W SPEC LENS (INTRAOCULAR LENS) ×2
PROCESS W NO LENS (INTRAOCULAR LENS) IMPLANT
PROCESS W SPEC LENS (INTRAOCULAR LENS) IMPLANT
RING MALYGIN (MISCELLANEOUS) IMPLANT
TAPE SURG TRANSPORE 1 IN (GAUZE/BANDAGES/DRESSINGS) IMPLANT
TAPE SURGICAL TRANSPORE 1 IN (GAUZE/BANDAGES/DRESSINGS) ×1
VISCOELASTIC ADDITIONAL (OPHTHALMIC RELATED) IMPLANT
WATER STERILE IRR 250ML POUR (IV SOLUTION) ×1 IMPLANT

## 2012-11-01 NOTE — Anesthesia Preprocedure Evaluation (Signed)
Anesthesia Evaluation  Patient identified by MRN, date of birth, ID band Patient awake    Reviewed: Allergy & Precautions, H&P , NPO status , Patient's Chart, lab work & pertinent test results  History of Anesthesia Complications (+) PONV  Airway Mallampati: II TM Distance: >3 FB     Dental  (+) Teeth Intact and Implants   Pulmonary shortness of breath, sleep apnea and Continuous Positive Airway Pressure Ventilation ,  breath sounds clear to auscultation        Cardiovascular negative cardio ROS  Rhythm:Regular Rate:Normal     Neuro/Psych  Neuromuscular disease    GI/Hepatic GERD-  Medicated and Controlled,  Endo/Other    Renal/GU      Musculoskeletal   Abdominal   Peds  Hematology   Anesthesia Other Findings   Reproductive/Obstetrics                           Anesthesia Physical Anesthesia Plan  ASA: III  Anesthesia Plan: MAC   Post-op Pain Management:    Induction: Intravenous  Airway Management Planned: Nasal Cannula  Additional Equipment:   Intra-op Plan:   Post-operative Plan:   Informed Consent: I have reviewed the patients History and Physical, chart, labs and discussed the procedure including the risks, benefits and alternatives for the proposed anesthesia with the patient or authorized representative who has indicated his/her understanding and acceptance.     Plan Discussed with:   Anesthesia Plan Comments:         Anesthesia Quick Evaluation

## 2012-11-01 NOTE — Transfer of Care (Signed)
Immediate Anesthesia Transfer of Care Note  Patient: Molly Black [facility]  Procedure(s) Performed: Procedure(s) with comments: CATARACT EXTRACTION PHACO AND INTRAOCULAR LENS PLACEMENT (IOC) (Left) - CDE:9.92  Patient Location: PACU and Short Stay  Anesthesia Type:MAC  Level of Consciousness: awake, alert  and oriented  Airway & Oxygen Therapy: Patient Spontanous Breathing  Post-op Assessment: Report given to PACU RN  Post vital signs: Reviewed  Complications: No apparent anesthesia complications

## 2012-11-01 NOTE — Op Note (Signed)
Patient brought to the operating room and prepped and draped in the usual manner.  Lid speculum inserted in left eye.  Stab incision made at the twelve o'clock position.  Provisc instilled in the anterior chamber.   A 2.4 mm. Stab incision was made temporally.  An anterior capsulotomy was done with a bent 25 gauge needle.  The nucleus was hydrodissected.  The Phaco tip was inserted in the anterior chamber and the nucleus was emulsified.  CDE was 9.92.  The cortical material was then removed with the I and A tip.  Posterior capsule was the polished.  The anterior chamber was deepened with Provisc.  A 23.0 Diopter Alcon [id number] Toric IOL was then inserted in the capsular bag. The Toric lens was placed at the 79 degree position.   Provisc was then removed with the I and A tip.  The wound was then hydrated.  Patient sent to the Recovery Room in good condition with follow up in my office.

## 2012-11-01 NOTE — H&P (Signed)
The patient was re examined and there is no change in the patients condition since the original H and P. 

## 2012-11-01 NOTE — Anesthesia Postprocedure Evaluation (Signed)
  Anesthesia Post-op Note  Patient: Molly Black [facility]  Procedure(s) Performed: Procedure(s) with comments: CATARACT EXTRACTION PHACO AND INTRAOCULAR LENS PLACEMENT (IOC) (Left) - CDE:9.92  Patient Location: PACU and Short Stay  Anesthesia Type:MAC  Level of Consciousness: awake, alert  and oriented  Airway and Oxygen Therapy: Patient Spontanous Breathing  Post-op Pain: none  Post-op Assessment: Post-op Vital signs reviewed, Patient's Cardiovascular Status Stable, Respiratory Function Stable, Patent Airway and No signs of Nausea or vomiting  Post-op Vital Signs: Reviewed and stable  Complications: No apparent anesthesia complications

## 2012-11-04 ENCOUNTER — Encounter (HOSPITAL_COMMUNITY): Payer: Self-pay | Admitting: Ophthalmology

## 2012-11-29 ENCOUNTER — Ambulatory Visit
Admission: RE | Admit: 2012-11-29 | Discharge: 2012-11-29 | Disposition: A | Discharge status: Home / Self Care | Payer: Medicare Other | Source: Ambulatory Visit | Attending: Oncology | Admitting: Oncology

## 2012-11-29 DIAGNOSIS — C50911 Malignant neoplasm of unspecified site of right female breast: Secondary | ICD-10-CM

## 2012-12-05 ENCOUNTER — Ambulatory Visit (HOSPITAL_BASED_OUTPATIENT_CLINIC_OR_DEPARTMENT_OTHER): Payer: Medicare Other | Admitting: Oncology

## 2012-12-05 ENCOUNTER — Telehealth: Payer: Self-pay | Admitting: Oncology

## 2012-12-05 VITALS — BP 130/94 | HR 70 | Temp 97.5°F | Resp 18 | Ht 63.0 in | Wt 166.4 lb

## 2012-12-05 DIAGNOSIS — R928 Other abnormal and inconclusive findings on diagnostic imaging of breast: Secondary | ICD-10-CM

## 2012-12-05 DIAGNOSIS — C50911 Malignant neoplasm of unspecified site of right female breast: Secondary | ICD-10-CM

## 2012-12-05 DIAGNOSIS — Z853 Personal history of malignant neoplasm of breast: Secondary | ICD-10-CM

## 2012-12-05 DIAGNOSIS — G609 Hereditary and idiopathic neuropathy, unspecified: Secondary | ICD-10-CM

## 2012-12-05 NOTE — Progress Notes (Signed)
Hematology and Oncology Follow Up Visit  Molly Black [facility] [id number] 01-27-1941 72 y.o. 12/05/2012 8:23 PM   Principle Diagnosis: Encounter Diagnosis  Name Primary?  . Carcinoma of breast, stage 2, estrogen receptor negative, right Yes     Interim History:   Followup visit for this 72 year old woman with a history of stage II, T2 N0, ER negative, PR positive, cancer of the right breast diagnosed in June 1989. She was treated with mastectomy with immediate reconstruction followed by CMF chemotherapy. She achieved a durable remission with no evidence for a recurrence now out a remarkable 25 years.  She has had no interim medical problems. She denies any bone pain, headache, change in vision, vaginal bleeding.  Medications: reviewed  Allergies: No Known Allergies  Review of Systems: Constitutional:   No constitutional symptoms Respiratory: No dyspnea Cardiovascular:   No chest pain or palpitations Gastrointestinal: No change in bowel habit Genito-Urinary: No urinary tract symptoms Musculoskeletal: See above Neurologic: See above Skin: No rash Remaining ROS negative.  Physical Exam: Blood pressure 130/94, pulse 70, temperature 97.5 F (36.4 C), temperature source Oral, resp. rate 18, height 5\' 3"  (1.6 m), weight 166 lb 6.4 oz (75.479 kg). Wt Readings from Last 3 Encounters:  12/05/12 166 lb 6.4 oz (75.479 kg)  11/01/12 167 lb (75.751 kg)  11/01/12 167 lb (75.751 kg)     General appearance: Well-nourished Caucasian woman HENNT: Pharynx no erythema or exudate Lymph nodes: No adenopathy Breasts: Left breast reconstruction. No dominant mass in either breast. Lungs: clear to auscultation resonant to percussion Heart: Regular rhythm no murmur Abdomen: Soft, nontender, no mass, no organomegaly Extremities: No edema, no calf tenderness Musculoskeletal: GU: Vascular: No cyanosis Neurologic: Grossly normal Skin: No rash or ecchymosis  Lab Results: Lab Results  Component  Value Date   WBC 6.3 11/25/2011   HGB 14.0 10/13/2012   HCT 39.9 10/13/2012   MCV 90.6 11/25/2011   PLT 240 11/25/2011     Chemistry      Component Value Date/Time   NA 140 10/13/2012 1330   K 4.2 10/13/2012 1330   CL 105 10/13/2012 1330   CO2 25 10/13/2012 1330   BUN 26* 10/13/2012 1330   CREATININE 0.68 10/13/2012 1330      Component Value Date/Time   CALCIUM 9.9 10/13/2012 1330   ALKPHOS 29* 11/25/2011 1428   AST 23 11/25/2011 1428   ALT 17 11/25/2011 1428   BILITOT 1.0 11/25/2011 1428       Radiological Studies: Mm Digital Screening Unilat L  11/30/2012  *RADIOLOGY REPORT*  Clinical Data:  Screening.  LEFT DIGITAL SCREENING MAMMOGRAM WITH CAD  Comparison: Previous examinations.  FINDINGS:  ACR Breast Density Category: 3: The breast tissue is heterogeneously dense.  The patient has had a right mastectomy.  No suspicious masses, architectural distortion, or calcifications are present.  Images were processed with CAD.  IMPRESSION: No evidence of malignancy.  Screening mammography is recommended in one year.  RECOMMENDATION: Screening mammogram in one year. (Code:SM-B-01Y)  BI-RADS CATEGORY 1:  Negative.   Original Report Authenticated By: Rolla Plate, M.D.     Impression: #1. 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 No evidence for recurrent disease now out 25 years from diagnosis. Although I have offered her graduation from our program, she would still like to see me once a year.  #2. Benign hemangioma of the liver  #3. Idiopathic peripheral neuropathy  #4. Diverticulosis  #5. Status post  TAH/BSO May 1997 for cystadenofibroma of the ovary  #6 GERD.  Plan:   CC:. Dr. Kirby Funk   Levert Feinstein, MD 5/12/20148:23 PM

## 2012-12-05 NOTE — Telephone Encounter (Signed)
gv and printed appt sched for pt for May 2015....emailed Dr. Reece Agar I s/w the [facility] to schedule the [place] adn they stated that it needed to be an MRI for what they will be looking for.

## 2012-12-06 ENCOUNTER — Other Ambulatory Visit: Payer: Self-pay | Admitting: Oncology

## 2012-12-06 DIAGNOSIS — Z171 Estrogen receptor negative status [ER-]: Secondary | ICD-10-CM

## 2012-12-07 ENCOUNTER — Telehealth: Payer: Self-pay | Admitting: Oncology

## 2012-12-07 NOTE — Telephone Encounter (Signed)
s.wDeatra Ina to sched MRI...she will contact pt and Mrs. Bonita Quin to sched appt.Marland KitchenMarland Kitchen

## 2013-01-09 ENCOUNTER — Ambulatory Visit
Admission: RE | Admit: 2013-01-09 | Discharge: 2013-01-09 | Disposition: A | Discharge status: Home / Self Care | Payer: Medicare Other | Source: Ambulatory Visit | Attending: Oncology | Admitting: Oncology

## 2013-01-09 DIAGNOSIS — C50911 Malignant neoplasm of unspecified site of right female breast: Secondary | ICD-10-CM

## 2013-01-09 MED ORDER — GADOBENATE DIMEGLUMINE 529 MG/ML IV SOLN
15.0000 mL | Freq: Once | INTRAVENOUS | Status: AC | PRN
  Administered 2013-01-09: 15 mL via INTRAVENOUS

## 2013-01-10 ENCOUNTER — Other Ambulatory Visit: Payer: Self-pay | Admitting: Oncology

## 2013-01-10 DIAGNOSIS — R928 Other abnormal and inconclusive findings on diagnostic imaging of breast: Secondary | ICD-10-CM

## 2013-01-13 ENCOUNTER — Ambulatory Visit
Admission: RE | Admit: 2013-01-13 | Discharge: 2013-01-13 | Disposition: A | Discharge status: Home / Self Care | Payer: Medicare Other | Source: Ambulatory Visit | Attending: Oncology | Admitting: Oncology

## 2013-01-13 DIAGNOSIS — R928 Other abnormal and inconclusive findings on diagnostic imaging of breast: Secondary | ICD-10-CM

## 2013-01-16 ENCOUNTER — Other Ambulatory Visit: Payer: Self-pay | Admitting: Oncology

## 2013-01-16 DIAGNOSIS — T8543XA Leakage of breast prosthesis and implant, initial encounter: Secondary | ICD-10-CM

## 2013-01-24 ENCOUNTER — Ambulatory Visit
Admission: RE | Admit: 2013-01-24 | Discharge: 2013-01-24 | Disposition: A | Discharge status: Home / Self Care | Payer: Medicare Other | Source: Ambulatory Visit | Attending: Oncology | Admitting: Oncology

## 2013-01-24 DIAGNOSIS — T8543XA Leakage of breast prosthesis and implant, initial encounter: Secondary | ICD-10-CM

## 2013-02-01 ENCOUNTER — Telehealth: Payer: Self-pay | Admitting: *Deleted

## 2013-02-01 NOTE — Telephone Encounter (Addendum)
Pt called & would like Dr. Patsy Lager opinion.  She reports that Dr. Cyndie Chime ordered an MRI & Dr. Chilton Si ordered another MRI & she also han an U/S & it has been determined that she has a leak of silicone from her implant in the right breast & has seeped into her left breast.  She was told she could do nothing or see a Engineer, petroleum.  She states that Dr Stephens November has retired.  Note to Dr Cyndie Chime & call back # is [phone].

## 2013-02-09 ENCOUNTER — Telehealth: Payer: Self-pay | Admitting: *Deleted

## 2013-02-09 NOTE — Telephone Encounter (Signed)
Called pt after receiving appt with Dr Odis Luster for Mar 13, 2014 @ 2:15pm & pt informed.  Dr. Odis Luster will see her in consultation & send info to her insurance to verify coverage for procedure.  Also informed that a general surgeon may have to be involved with seepage into contralateral breast but Dr Odis Luster will review.  Will fax office note & recent radiology reports to Dr Odis Luster @ [phone].  Pt expressed understanding & appreciation.

## 2013-04-04 ENCOUNTER — Other Ambulatory Visit: Payer: Self-pay | Admitting: Plastic Surgery

## 2013-04-04 ENCOUNTER — Encounter (HOSPITAL_COMMUNITY): Payer: Self-pay | Admitting: Pharmacy Technician

## 2013-04-04 NOTE — Pre-Procedure Instructions (Signed)
Molly Black [facility]  04/04/2013   Your procedure is scheduled on:  Thurs, Sept 18 @ 11:00 AM  Report to [facility] at 9:00 AM.  Call this number if you have problems the morning of surgery: [phone]   Remember:   Do not eat food or drink liquids after midnight.   Take these medicines the morning of surgery with A SIP OF WATER: Nexium(Esomeprazole)               No Goody's,BC's,Aleve,Ibuprofen,Aspirin,Fish Oil,or any Herbal Medications   Do not wear jewelry, make-up or nail polish.  Do not wear lotions, powders, or perfumes. You may wear deodorant.  Do not shave 48 hours prior to surgery.   Do not bring valuables to the hospital.  [facility] is not responsible                   for any belongings or valuables.  Contacts, dentures or bridgework may not be worn into surgery.  Leave suitcase in the car. After surgery it may be brought to your room.  For patients admitted to the hospital, checkout time is 11:00 AM the day of  discharge.   Patients discharged the day of surgery will not be allowed to drive  home.    Special Instructions: Shower using CHG 2 nights before surgery and the night before surgery.  If you shower the day of surgery use CHG.  Use special wash - you have one bottle of CHG for all showers.  You should use approximately 1/3 of the bottle for each shower.   Please read over the following fact sheets that you were given: Pain Booklet, Coughing and Deep Breathing and Surgical Site Infection Prevention

## 2013-04-05 ENCOUNTER — Encounter (HOSPITAL_COMMUNITY)
Admission: RE | Admit: 2013-04-05 | Discharge: 2013-04-05 | Disposition: A | Discharge status: Home / Self Care | Payer: Medicare Other | Source: Ambulatory Visit | Attending: Anesthesiology | Admitting: Anesthesiology

## 2013-04-05 ENCOUNTER — Encounter (HOSPITAL_COMMUNITY): Payer: Self-pay

## 2013-04-05 ENCOUNTER — Encounter (HOSPITAL_COMMUNITY)
Admission: RE | Admit: 2013-04-05 | Discharge: 2013-04-05 | Disposition: A | Discharge status: Home / Self Care | Payer: Medicare Other | Source: Ambulatory Visit | Attending: Plastic Surgery | Admitting: Plastic Surgery

## 2013-04-05 DIAGNOSIS — Z01812 Encounter for preprocedural laboratory examination: Secondary | ICD-10-CM | POA: Insufficient documentation

## 2013-04-05 DIAGNOSIS — Z01818 Encounter for other preprocedural examination: Secondary | ICD-10-CM | POA: Insufficient documentation

## 2013-04-05 HISTORY — DX: Personal history of other diseases of the digestive system: Z87.19

## 2013-04-05 HISTORY — DX: Pneumonia, unspecified organism: J18.9

## 2013-04-05 HISTORY — DX: Anxiety disorder, unspecified: F41.9

## 2013-04-05 LAB — CBC
HCT: 42.6 % (ref 36.0–46.0)
MCH: 31.6 pg (ref 26.0–34.0)
MCV: 88 fL (ref 78.0–100.0)
Platelets: 232 10*3/uL (ref 150–400)
RBC: 4.84 MIL/uL (ref 3.87–5.11)
WBC: 9.1 10*3/uL (ref 4.0–10.5)

## 2013-04-05 LAB — BASIC METABOLIC PANEL
CO2: 26 mEq/L (ref 19–32)
Calcium: 9.9 mg/dL (ref 8.4–10.5)
Chloride: 102 mEq/L (ref 96–112)
Creatinine, Ser: 0.58 mg/dL (ref 0.50–1.10)
Glucose, Bld: 84 mg/dL (ref 70–99)
Sodium: 137 mEq/L (ref 135–145)

## 2013-04-05 NOTE — Progress Notes (Signed)
Denies having a Cardiologist, stress test, echo, or cardiac cath.  Pt PCP is Dr Lillia Mountain.

## 2013-04-06 NOTE — Progress Notes (Signed)
Anesthesia Chart Review:  Patient is a 72 year old female scheduled for removal of right ruptured breast implant, possible capsulectomy, and delayed right breast reconstruction with silicone gel implant on 04/13/13 by Dr. Odis Luster. 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  PCP is Dr. Kirby Funk.  EKG on 10/13/12 showed NSR, cannot rule out anterior infarct (age undetermined).  When compared to EKG from 06/20/02 (see Muse), her lateral non-specific T wave abnormality has improved.  Preoperative CXR and labs noted.  Anticipate that she can proceed as planned.  Velna Ochs [facility]/Anesthesiology Phone [phone] 04/06/2013 11:50 AM

## 2013-04-12 MED ORDER — CEFAZOLIN SODIUM-DEXTROSE 2-3 GM-% IV SOLR
2.0000 g | INTRAVENOUS | Status: AC
  Administered 2013-04-13: 2 g via INTRAVENOUS
  Filled 2013-04-12: qty 50

## 2013-04-12 NOTE — Progress Notes (Signed)
Patient made aware she needs to arrive at 530 am 04/13/13.

## 2013-04-13 ENCOUNTER — Ambulatory Visit (HOSPITAL_COMMUNITY): Payer: Medicare Other | Admitting: Anesthesiology

## 2013-04-13 ENCOUNTER — Encounter (HOSPITAL_COMMUNITY): Payer: Self-pay | Admitting: *Deleted

## 2013-04-13 ENCOUNTER — Inpatient Hospital Stay (HOSPITAL_COMMUNITY)
Admission: RE | Admit: 2013-04-13 | Discharge: 2013-04-14 | DRG: 585 | Disposition: A | Discharge status: Home / Self Care | Payer: Medicare Other | Source: Ambulatory Visit | Attending: Plastic Surgery | Admitting: Plastic Surgery

## 2013-04-13 ENCOUNTER — Encounter (HOSPITAL_COMMUNITY)
Admission: RE | Disposition: A | Discharge status: Home / Self Care | Payer: Self-pay | Source: Ambulatory Visit | Attending: Plastic Surgery

## 2013-04-13 ENCOUNTER — Encounter (HOSPITAL_COMMUNITY): Payer: Self-pay | Admitting: Vascular Surgery

## 2013-04-13 DIAGNOSIS — Z853 Personal history of malignant neoplasm of breast: Secondary | ICD-10-CM

## 2013-04-13 DIAGNOSIS — T8549XA Other mechanical complication of breast prosthesis and implant, initial encounter: Principal | ICD-10-CM | POA: Diagnosis present

## 2013-04-13 DIAGNOSIS — N6459 Other signs and symptoms in breast: Secondary | ICD-10-CM | POA: Diagnosis present

## 2013-04-13 HISTORY — PX: BREAST CAPSULECTOMY WITH IMPLANT EXCHANGE: SHX5592

## 2013-04-13 HISTORY — PX: BREAST IMPLANT REMOVAL: SHX5361

## 2013-04-13 HISTORY — PX: CAPSULECTOMY: SHX5381

## 2013-04-13 HISTORY — DX: Obstructive sleep apnea (adult) (pediatric): G47.33

## 2013-04-13 HISTORY — DX: Unspecified malignant neoplasm of skin of unspecified part of face: C44.300

## 2013-04-13 HISTORY — DX: Dependence on other enabling machines and devices: Z99.89

## 2013-04-13 LAB — GRAM STAIN

## 2013-04-13 SURGERY — REMOVAL, IMPLANT, BREAST
Anesthesia: General | Site: Breast | Laterality: Right | Wound class: Clean

## 2013-04-13 SURGERY — REMOVAL, IMPLANT, BREAST
Anesthesia: General | Laterality: Right

## 2013-04-13 MED ORDER — HYDROMORPHONE HCL PF 1 MG/ML IJ SOLN
0.2500 mg | INTRAMUSCULAR | Status: DC | PRN
  Administered 2013-04-13 (×2): 0.25 mg via INTRAVENOUS

## 2013-04-13 MED ORDER — 0.9 % SODIUM CHLORIDE (POUR BTL) OPTIME
TOPICAL | Status: DC | PRN
  Administered 2013-04-13: 1000 mL

## 2013-04-13 MED ORDER — ONDANSETRON HCL 4 MG/2ML IJ SOLN
INTRAMUSCULAR | Status: DC | PRN
  Administered 2013-04-13 (×2): 4 mg via INTRAVENOUS

## 2013-04-13 MED ORDER — NEOSTIGMINE METHYLSULFATE 1 MG/ML IJ SOLN
INTRAMUSCULAR | Status: DC | PRN
  Administered 2013-04-13: 5 mg via INTRAVENOUS

## 2013-04-13 MED ORDER — PANTOPRAZOLE SODIUM 40 MG PO TBEC: 80.0000 mg | DELAYED_RELEASE_TABLET | Freq: Every day | ORAL | Status: DC

## 2013-04-13 MED ORDER — LIDOCAINE HCL (CARDIAC) 20 MG/ML IV SOLN
INTRAVENOUS | Status: DC | PRN
  Administered 2013-04-13: 100 mg via INTRAVENOUS

## 2013-04-13 MED ORDER — DEXAMETHASONE SODIUM PHOSPHATE 10 MG/ML IJ SOLN
INTRAMUSCULAR | Status: DC | PRN
  Administered 2013-04-13: 10 mg via INTRAVENOUS

## 2013-04-13 MED ORDER — PHENYLEPHRINE HCL 10 MG/ML IJ SOLN
INTRAMUSCULAR | Status: DC | PRN
  Administered 2013-04-13: 40 ug via INTRAVENOUS

## 2013-04-13 MED ORDER — BACITRACIN ZINC 500 UNIT/GM EX OINT
TOPICAL_OINTMENT | CUTANEOUS | Status: AC
  Filled 2013-04-13: qty 15

## 2013-04-13 MED ORDER — PROPOFOL 10 MG/ML IV BOLUS
INTRAVENOUS | Status: DC | PRN
  Administered 2013-04-13: 50 mg via INTRAVENOUS
  Administered 2013-04-13: 150 mg via INTRAVENOUS

## 2013-04-13 MED ORDER — CEFAZOLIN SODIUM 1-5 GM-% IV SOLN
1.0000 g | Freq: Three times a day (TID) | INTRAVENOUS | Status: DC
  Administered 2013-04-13 – 2013-04-14 (×3): 1 g via INTRAVENOUS
  Filled 2013-04-13 (×5): qty 50

## 2013-04-13 MED ORDER — DEXTROSE-NACL 5-0.45 % IV SOLN
INTRAVENOUS | Status: DC
  Administered 2013-04-13 – 2013-04-14 (×2): via INTRAVENOUS

## 2013-04-13 MED ORDER — PROMETHAZINE HCL 25 MG/ML IJ SOLN
6.2500 mg | INTRAMUSCULAR | Status: DC | PRN
  Administered 2013-04-13: 6.25 mg via INTRAVENOUS
  Filled 2013-04-13: qty 1

## 2013-04-13 MED ORDER — HEPARIN SODIUM (PORCINE) 5000 UNIT/ML IJ SOLN
5000.0000 [IU] | Freq: Once | INTRAMUSCULAR | Status: AC
  Administered 2013-04-13: 5000 [IU] via SUBCUTANEOUS

## 2013-04-13 MED ORDER — ROCURONIUM BROMIDE 100 MG/10ML IV SOLN
INTRAVENOUS | Status: DC | PRN
  Administered 2013-04-13: 30 mg via INTRAVENOUS
  Administered 2013-04-13 (×3): 10 mg via INTRAVENOUS

## 2013-04-13 MED ORDER — CHLORHEXIDINE GLUCONATE 4 % EX LIQD: 1.0000 "application " | Freq: Once | CUTANEOUS | Status: DC

## 2013-04-13 MED ORDER — DOCUSATE SODIUM 100 MG PO CAPS
100.0000 mg | ORAL_CAPSULE | Freq: Every day | ORAL | Status: DC
  Filled 2013-04-13: qty 1

## 2013-04-13 MED ORDER — HYDROMORPHONE HCL PF 1 MG/ML IJ SOLN
INTRAMUSCULAR | Status: AC
  Administered 2013-04-13: 0.25 mg via INTRAVENOUS
  Filled 2013-04-13: qty 1

## 2013-04-13 MED ORDER — ONDANSETRON HCL 4 MG/2ML IJ SOLN: 4.0000 mg | Freq: Once | INTRAMUSCULAR | Status: DC | PRN

## 2013-04-13 MED ORDER — SODIUM CHLORIDE 0.9 % IR SOLN
Status: DC | PRN
  Administered 2013-04-13: 10:00:00

## 2013-04-13 MED ORDER — ARTIFICIAL TEARS OP OINT
TOPICAL_OINTMENT | OPHTHALMIC | Status: DC | PRN
  Administered 2013-04-13: 1 via OPHTHALMIC

## 2013-04-13 MED ORDER — FENTANYL CITRATE 0.05 MG/ML IJ SOLN
INTRAMUSCULAR | Status: DC | PRN
  Administered 2013-04-13: 50 ug via INTRAVENOUS
  Administered 2013-04-13: 100 ug via INTRAVENOUS
  Administered 2013-04-13 (×4): 50 ug via INTRAVENOUS
  Administered 2013-04-13 (×2): 25 ug via INTRAVENOUS

## 2013-04-13 MED ORDER — METHOCARBAMOL 500 MG PO TABS
500.0000 mg | ORAL_TABLET | Freq: Three times a day (TID) | ORAL | Status: DC
  Administered 2013-04-13 (×2): 500 mg via ORAL
  Filled 2013-04-13 (×5): qty 1

## 2013-04-13 MED ORDER — HEPARIN SODIUM (PORCINE) 5000 UNIT/ML IJ SOLN
INTRAMUSCULAR | Status: AC
  Filled 2013-04-13: qty 1

## 2013-04-13 MED ORDER — LACTATED RINGERS IV SOLN
INTRAVENOUS | Status: DC | PRN
  Administered 2013-04-13 (×2): via INTRAVENOUS

## 2013-04-13 MED ORDER — GLYCOPYRROLATE 0.2 MG/ML IJ SOLN
INTRAMUSCULAR | Status: DC | PRN
  Administered 2013-04-13: .8 mg via INTRAVENOUS

## 2013-04-13 MED ORDER — HYDROMORPHONE HCL 2 MG PO TABS
2.0000 mg | ORAL_TABLET | ORAL | Status: DC | PRN
  Administered 2013-04-14: 4 mg via ORAL
  Filled 2013-04-13: qty 2

## 2013-04-13 MED ORDER — LIDOCAINE HCL 4 % MT SOLN
OROMUCOSAL | Status: DC | PRN
  Administered 2013-04-13: 4 mL via TOPICAL

## 2013-04-13 SURGICAL SUPPLY — 53 items
ADH SKN CLS APL DERMABOND .7 (GAUZE/BANDAGES/DRESSINGS) ×1
ATCH SMKEVC FLXB CAUT HNDSWH (FILTER) ×1 IMPLANT
BANDAGE ELASTIC 6 VELCRO ST LF (GAUZE/BANDAGES/DRESSINGS) IMPLANT
BINDER BREAST XXLRG (GAUZE/BANDAGES/DRESSINGS) ×1 IMPLANT
BIOPATCH RED 1 DISK 7.0 (GAUZE/BANDAGES/DRESSINGS) ×3 IMPLANT
BLADE SURG 10 STRL SS (BLADE) ×2 IMPLANT
CANISTER SUCTION 2500CC (MISCELLANEOUS) ×2 IMPLANT
CHLORAPREP W/TINT 26ML (MISCELLANEOUS) ×2 IMPLANT
CLOTH BEACON ORANGE TIMEOUT ST (SAFETY) ×1 IMPLANT
COVER SURGICAL LIGHT HANDLE (MISCELLANEOUS) ×2 IMPLANT
DERMABOND ADVANCED (GAUZE/BANDAGES/DRESSINGS) ×1
DERMABOND ADVANCED .7 DNX12 (GAUZE/BANDAGES/DRESSINGS) IMPLANT
DRAIN CHANNEL 19F RND (DRAIN) IMPLANT
DRAPE LAPAROSCOPIC ABDOMINAL (DRAPES) ×2 IMPLANT
DRAPE LAPAROTOMY T 102X78X121 (DRAPES) ×1 IMPLANT
DRAPE UTILITY XL STRL (DRAPES) ×2 IMPLANT
DRSG PAD ABDOMINAL 8X10 ST (GAUZE/BANDAGES/DRESSINGS) ×5 IMPLANT
DRSG TEGADERM 4X4.75 (GAUZE/BANDAGES/DRESSINGS) ×1 IMPLANT
ELECT REM PT RETURN 9FT ADLT (ELECTROSURGICAL) ×2
ELECTRODE REM PT RTRN 9FT ADLT (ELECTROSURGICAL) ×1 IMPLANT
EVACUATOR SILICONE 100CC (DRAIN) IMPLANT
EVACUATOR SMOKE ACCUVAC VALLEY (FILTER) ×1
GLOVE BIO SURGEON STRL SZ7.5 (GLOVE) ×4 IMPLANT
GLOVE BIOGEL PI IND STRL 6.5 (GLOVE) IMPLANT
GLOVE BIOGEL PI IND STRL 7.0 (GLOVE) IMPLANT
GLOVE BIOGEL PI IND STRL 8 (GLOVE) ×1 IMPLANT
GLOVE BIOGEL PI INDICATOR 6.5 (GLOVE) ×1
GLOVE BIOGEL PI INDICATOR 7.0 (GLOVE) ×1
GLOVE BIOGEL PI INDICATOR 8 (GLOVE) ×1
GLOVE SURG SS PI 6.5 STRL IVOR (GLOVE) ×4 IMPLANT
GOWN STRL NON-REIN LRG LVL3 (GOWN DISPOSABLE) ×3 IMPLANT
GOWN STRL REIN XL XLG (GOWN DISPOSABLE) ×2 IMPLANT
IMPL GEL 500CC (Breast) IMPLANT
IMPLANT GEL 500CC (Breast) ×2 IMPLANT
KIT BASIN OR (CUSTOM PROCEDURE TRAY) ×2 IMPLANT
KIT ROOM TURNOVER OR (KITS) ×2 IMPLANT
MARKER SKIN DUAL TIP RULER LAB (MISCELLANEOUS) ×2 IMPLANT
NS IRRIG 1000ML POUR BTL (IV SOLUTION) ×2 IMPLANT
PACK GENERAL/GYN (CUSTOM PROCEDURE TRAY) ×2 IMPLANT
PAD ARMBOARD 7.5X6 YLW CONV (MISCELLANEOUS) ×4 IMPLANT
PIN SAFETY STERILE (MISCELLANEOUS) ×1 IMPLANT
PREFILTER EVAC NS 1 1/3-3/8IN (MISCELLANEOUS) ×1 IMPLANT
SPONGE GAUZE 4X4 12PLY (GAUZE/BANDAGES/DRESSINGS) ×4 IMPLANT
SPONGE LAP 18X18 X RAY DECT (DISPOSABLE) ×1 IMPLANT
STAPLER VISISTAT 35W (STAPLE) ×2 IMPLANT
SUT MNCRL AB 3-0 PS2 18 (SUTURE) ×5 IMPLANT
SUT PROLENE 3 0 PS 2 (SUTURE) ×2 IMPLANT
SUT VIC AB 3-0 SH 18 (SUTURE) ×3 IMPLANT
SWAB COLLECTION DEVICE MRSA (MISCELLANEOUS) ×1 IMPLANT
TOWEL OR 17X24 6PK STRL BLUE (TOWEL DISPOSABLE) ×2 IMPLANT
TOWEL OR 17X26 10 PK STRL BLUE (TOWEL DISPOSABLE) ×2 IMPLANT
TUBE ANAEROBIC SPECIMEN COL (MISCELLANEOUS) ×1 IMPLANT
WATER STERILE IRR 1000ML POUR (IV SOLUTION) IMPLANT

## 2013-04-13 NOTE — Anesthesia Preprocedure Evaluation (Signed)
Anesthesia Evaluation  Patient identified by MRN, date of birth, ID band Patient awake    Reviewed: Allergy & Precautions, H&P , NPO status , Patient's Chart, lab work & pertinent test results  Airway       Dental   Pulmonary sleep apnea ,          Cardiovascular     Neuro/Psych Anxiety  Neuromuscular disease    GI/Hepatic hiatal hernia, GERD-  ,  Endo/Other    Renal/GU      Musculoskeletal   Abdominal   Peds  Hematology   Anesthesia Other Findings   Reproductive/Obstetrics                           Anesthesia Physical Anesthesia Plan  ASA: III  Anesthesia Plan: General   Post-op Pain Management:    Induction: Intravenous  Airway Management Planned: Oral ETT  Additional Equipment:   Intra-op Plan:   Post-operative Plan: Extubation in OR  Informed Consent: I have reviewed the patients History and Physical, chart, labs and discussed the procedure including the risks, benefits and alternatives for the proposed anesthesia with the patient or authorized representative who has indicated his/her understanding and acceptance.     Plan Discussed with: CRNA, Anesthesiologist and Surgeon  Anesthesia Plan Comments:         Anesthesia Quick Evaluation

## 2013-04-13 NOTE — Progress Notes (Signed)
Utilization Review Completed.   Alauna Hayden, RN, BSN Nurse Case Manager  [phone]  

## 2013-04-13 NOTE — H&P (Signed)
I have re-examined and re-evaluated the patient and there are no changes. See office note dated 04/04/2013 in paper chart. Planned procedure is removal of implant right chest, drainage of any hematoma/seroma that may be present, and delayed reconstruction with silicone gel implant if possible.

## 2013-04-13 NOTE — Brief Op Note (Signed)
04/13/2013  10:21 AM  PATIENT:  Molly Black  72 y.o. female  PRE-OPERATIVE DIAGNOSIS:  BREAST CANCER  POST-OPERATIVE DIAGNOSIS:  BREAST CANCER  PROCEDURE:  Procedure(s): REMOVAL RIGHT RUPTURED BREAST IMPLANTS, DELAYED BREAST RECONSTRUCTION WITH SILICONE GEL IMPLANTS (Right) CAPSULECTOMY (Right)  SURGEON:  Surgeon(s) and Role:    * Etter Sjogren, MD - Primary  PHYSICIAN ASSISTANT:   ASSISTANTS: none   ANESTHESIA:   general  EBL:  Total I/O In: 1500 [I.V.:1500] Out: -   BLOOD ADMINISTERED:none  DRAINS: (1) Jackson-Pratt drain(s) with closed bulb suction in the right chest   LOCAL MEDICATIONS USED:  NONE  SPECIMEN:  Source of Specimen:  Right chest capsule  DISPOSITION OF SPECIMEN:  PATHOLOGY  COUNTS:  YES  TOURNIQUET:  * No tourniquets in log *  DICTATION: .Other Dictation: Dictation Number [id number]  PLAN OF CARE: Admit to inpatient   PATIENT DISPOSITION:  PACU - hemodynamically stable.   Delay start of Pharmacological VTE agent (>24hrs) due to surgical blood loss or risk of bleeding: no (she had heparin subcu pre-op)

## 2013-04-13 NOTE — Preoperative (Signed)
Beta Blockers   Reason not to administer Beta Blockers:Not Applicable 

## 2013-04-13 NOTE — Transfer of Care (Signed)
Immediate Anesthesia Transfer of Care Note  Patient: Molly Black [facility]  Procedure(s) Performed: Procedure(s): REMOVAL RIGHT RUPTURED BREAST IMPLANTS, DELAYED BREAST RECONSTRUCTION WITH SILICONE GEL IMPLANTS (Right) CAPSULECTOMY (Right)  Patient Location: PACU  Anesthesia Type:General  Level of Consciousness: oriented and patient cooperative  Airway & Oxygen Therapy: Patient Spontanous Breathing and Patient connected to face mask oxygen  Post-op Assessment: Report given to PACU RN and Post -op Vital signs reviewed and stable  Post vital signs: Reviewed and stable  Complications: No apparent anesthesia complications

## 2013-04-13 NOTE — Anesthesia Procedure Notes (Signed)
Procedure Name: Intubation Date/Time: 04/13/2013 7:38 AM Performed by: Sherie Don Pre-anesthesia Checklist: Patient identified, Emergency Drugs available, Suction available, Patient being monitored and Timeout performed Patient Re-evaluated:Patient Re-evaluated prior to inductionOxygen Delivery Method: Circle system utilized Preoxygenation: Pre-oxygenation with 100% oxygen Intubation Type: IV induction Ventilation: Mask ventilation without difficulty Laryngoscope Size: Mac and 4 Grade View: Grade I Tube type: Oral Tube size: 7.0 mm Number of attempts: 1 Airway Equipment and Method: Stylet and LTA kit utilized Placement Confirmation: ETT inserted through vocal cords under direct vision,  positive ETCO2 and breath sounds checked- equal and bilateral Secured at: 23 cm Tube secured with: Tape Dental Injury: Teeth and Oropharynx as per pre-operative assessment

## 2013-04-13 NOTE — Anesthesia Postprocedure Evaluation (Signed)
  Anesthesia Post-op Note  Patient: Molly Black  Procedure(s) Performed: Procedure(s): REMOVAL RIGHT RUPTURED BREAST IMPLANTS, DELAYED BREAST RECONSTRUCTION WITH SILICONE GEL IMPLANTS (Right) CAPSULECTOMY (Right)  Patient Location: PACU  Anesthesia Type:General  Level of Consciousness: awake, alert , oriented and patient cooperative  Airway and Oxygen Therapy: Patient Spontanous Breathing  Post-op Pain: mild  Post-op Assessment: Post-op Vital signs reviewed, Patient's Cardiovascular Status Stable, Respiratory Function Stable, Patent Airway, No signs of Nausea or vomiting and Pain level controlled  Post-op Vital Signs: stable  Complications: No apparent anesthesia complications

## 2013-04-13 NOTE — Progress Notes (Signed)
Report given to Rebecca RN.

## 2013-04-14 MED ORDER — CEPHALEXIN 500 MG PO CAPS: 500.0000 mg | ORAL_CAPSULE | Freq: Four times a day (QID) | ORAL | Status: DC

## 2013-04-14 MED ORDER — DSS 100 MG PO CAPS: 100.0000 mg | ORAL_CAPSULE | Freq: Every day | ORAL | Status: DC

## 2013-04-14 MED ORDER — METHOCARBAMOL 500 MG PO TABS: 500.0000 mg | ORAL_TABLET | Freq: Three times a day (TID) | ORAL | Status: DC

## 2013-04-14 MED ORDER — HYDROMORPHONE HCL 2 MG PO TABS: 2.0000 mg | ORAL_TABLET | ORAL | Status: DC | PRN

## 2013-04-14 NOTE — Op Note (Signed)
Molly Black, Molly Black               ACCOUNT NO.:  [id number]  MEDICAL RECORD NO.:  [id number]  LOCATION:  6N12C                        FACILITY:  [facility]  PHYSICIAN:  Etter Sjogren, M.D.     DATE OF BIRTH:  1941/05/20  DATE OF PROCEDURE:  04/13/2013 DATE OF DISCHARGE:                              OPERATIVE REPORT   PREOPERATIVE DIAGNOSIS: 1. Personal history of breast cancer. 2. Mechanical complication of silicone gel breast reconstruction,     right breast. 3. Large hematoma, right chest.  POSTOPERATIVE DIAGNOSIS: 1. Personal history of breast cancer. 2. Mechanical complication of silicone gel breast reconstruction,     right breast. 3. Large hematoma, right chest.  PROCEDURE PERFORMED: 1. Delayed right breast reconstruction with silicone gel implants. 2. Distinct procedure total capsulectomy. 3. Drainage of large hematoma right chest, distinct procedure. 4. Removal of implant material, right chest distinct procedure.  SURGEON:  Etter Sjogren, M.D.  ANESTHESIA:  General.  ESTIMATED BLOOD LOSS:  75 mL.  DRAINS:  One 19-French.  CLINICAL NOTE:  A 72 year old woman who has had breast cancer, right breast and has had reconstruction over 20 years ago.  She developed a leakage of her silicone gel implant, and also a very large, swollen area in the right chest a few months ago.  This has been painful.  It appears to be hematoma.  Studies on the right breast implant revealed rupture. She desired removal of all of this and reconstruction with a silicone gel implant.  Next, the procedure and the risks plus complications were discussed with her in great detail.  These risks include, but not limited to, bleeding, infection, healing problems, scarring, loss of sensation, fluid accumulations, anesthesia complications, pneumothorax DVT, pulmonary embolism, failure of device, capsular contracture, displacement device, wrinkles, ripples, chronic pain, disappointment, asymmetry, and the  possibility that we would not be able to completely reconstruction today depending on the findings.  She understood all of this, as well as possibility of contour deformities and malposition and wished to proceed.  DESCRIPTION OF PROCEDURE:  The patient was taken to the operating room, after having been marked in the holding area.  She was placed supine and then after successful general anesthesia, she was prepped with ChloraPrep after waiting for the full 3 minutes for drying.  She was draped with sterile drapes including impervious drapes.  The old mastectomy scar was utilized for a short distance.  The dissection was carried down through the subcutaneous tissue.  The underlying muscle was identified and a muscle-splitting incision was used to gain access to the submuscular space.  The capsule was opened.  The hematoma was evacuated as well as the ruptured implant.  This was a thick rubbery capsule.  A total capsulectomy was then performed taking great care to avoid damage to the overlying muscle and underlying chest cavity as the capsule was carefully dissected free from the posterior chest.  This was removed as a specimen.  Thorough irrigation with saline and meticulous hemostasis with electrocautery.  Gram stain and culture was taken.  Gram stain did return while in the operating room, and it was negative for any organisms.  WBCs were seen.  Thorough irrigation with saline, meticulous  hemostasis with electrocautery.  Thorough irrigation with antibiotic solution.  A 19-French drain was positioned, brought through separate stab wound inferolaterally and secured with 3-0 Prolene suture. After thoroughly cleaning gloves, the implant was prepared.  This was mentor 500 mL implant silicone gel as requested by the patient.  It was soaked in antibiotic solution for greater than 5 minutes.  Again, a careful check for hemostasis was noted to be excellent.  The implant was then positioned and  the muscle layer closed with 3-0 Vicryl simple interrupted sutures.  Great care was taken to avoid damage to underlying implant which was kept under direct vision at all times.  The wound again irrigated with antibiotic solution and the wound closed 3-0 Monocryl interrupted inverted deep dermal sutures and 3-0 Monocryl running subcuticular suture.  Biopatch and Tegaderm placed over the drain.  Dermabond for the wound and ABDs positioned as well as the chest vest and this was for some compression.  She was transferred to the recovery room in stable having tolerated the procedure well.     Etter Sjogren, M.D.     DB/MEDQ  D:  04/13/2013  T:  04/14/2013  Job:  [id number]

## 2013-04-14 NOTE — Progress Notes (Signed)
Verbally understood DC instructions, no questions asked, Jp teaching sheet and supplies sent home with pt.  Pt and friend state they are comfortable taking care of drain

## 2013-04-14 NOTE — Discharge Summary (Signed)
Physician Discharge Summary  Patient ID: Molly Black MRN: [id number] DOB/AGE: 02/01/1941 71 y.o.  Admit date: 04/13/2013 Discharge date: 04/14/2013  Admission Diagnoses:personal history of breast cancer, rupture of silicone gel implant right breast reconstruction, large hematoma right chest  Discharge Diagnoses:  Active Problems:   * No active hospital problems. *   Discharged Condition: good  Hospital Course: On the day of admission the patient was taken to surgery and had Drainage of hematoma, total capsulectomy, removal of implant material, and delayed breast reconstruction with silicone gel implant all of the right chest. The patient tolerated the procedures well.The patient was ambulatory and tolerating diet on the first postoperative day. She is ready for discharge. Nausea has resolved..  Significant Diagnostic Studies: microbiology: Intraoperative gram stain of hematoma fluid was negative for organisms  Treatments: antibiotics: Ancef, anticoagulation: heparin (pre-op) and surgery: right chest drainage hematoma, total capsulectomy, removal implant material and delayed reconstruction of breast with implant.  Discharge Exam: Blood pressure 125/68, pulse 59, temperature 97.7 F (36.5 C), temperature source Oral, resp. rate 20, height 5\' 3"  (1.6 m), weight 178 lb 5.6 oz (80.9 kg), SpO2 98.00%.  Operative sites: Right chest is soft with implant in good position. No evidence of bleeding, infection. Drains functioning. Drainage thin.  Disposition: 01-Home or Self Care   Future Appointments Provider Department Dept Phone   11/30/2013 10:00 AM [facility] [id number]   Patient should wear two piece clothing and wear no powder or deodorant. Patient should arrive 15 minutes early.   12/04/2013 1:30 PM [facility] MEDICAL ONCOLOGY [phone]   12/04/2013 2:00 PM Levert Feinstein, MD  [facility]  MEDICAL ONCOLOGY [phone]       Medication List         alendronate 70 MG tablet  Commonly known as:  FOSAMAX  Take 70 mg by mouth every 7 (seven) days. Take with a full glass of water on an empty stomach. On Saturday     CALCIUM 600+D PO  Take by mouth 2 (two) times daily.     cephALEXin 500 MG capsule  Commonly known as:  KEFLEX  Take 1 capsule (500 mg total) by mouth every 6 (six) hours.     Cyanocobalamin 2500 MCG Tabs  Take 1 tablet by mouth daily.     DSS 100 MG Caps  Take 100 mg by mouth daily.     esomeprazole 40 MG capsule  Commonly known as:  NEXIUM  Take 40 mg by mouth daily as needed (Acid Reflux).     HYDROmorphone 2 MG tablet  Commonly known as:  DILAUDID  Take 1-2 tablets (2-4 mg total) by mouth every 4 (four) hours as needed.     methocarbamol 500 MG tablet  Commonly known as:  ROBAXIN  Take 1 tablet (500 mg total) by mouth 3 (three) times daily.     multivitamin capsule  Take 1 capsule by mouth daily.     Vitamin D3 3000 UNITS Tabs  Take 1 tablet by mouth daily.         SignedOdis Luster, Sylvanus Telford M 04/14/2013, 8:15 AM

## 2013-04-15 LAB — WOUND CULTURE

## 2013-04-17 ENCOUNTER — Encounter (HOSPITAL_COMMUNITY): Payer: Self-pay | Admitting: Plastic Surgery

## 2013-04-18 LAB — ANAEROBIC CULTURE

## 2013-04-26 ENCOUNTER — Telehealth: Payer: Self-pay | Admitting: *Deleted

## 2013-04-26 NOTE — Telephone Encounter (Signed)
Pt called requesting copy of her last chest x-ray and copy of MRI results.  In-basket request sent to [facility].

## 2013-06-01 ENCOUNTER — Other Ambulatory Visit: Payer: Self-pay

## 2013-09-26 ENCOUNTER — Telehealth: Payer: Self-pay | Admitting: Oncology

## 2013-09-26 NOTE — Telephone Encounter (Signed)
12/04/13 APPTS CX'D PER JG 09/26/13 PENDING MOVE TO [facility] AND HE WILL CONTACT PT RE D/C

## 2013-10-09 ENCOUNTER — Encounter: Payer: Self-pay | Admitting: Internal Medicine

## 2013-10-11 ENCOUNTER — Ambulatory Visit (INDEPENDENT_AMBULATORY_CARE_PROVIDER_SITE_OTHER): Payer: Medicare Other | Admitting: Physician Assistant

## 2013-10-11 ENCOUNTER — Other Ambulatory Visit (INDEPENDENT_AMBULATORY_CARE_PROVIDER_SITE_OTHER): Payer: Medicare Other

## 2013-10-11 ENCOUNTER — Encounter: Payer: Self-pay | Admitting: Physician Assistant

## 2013-10-11 VITALS — BP 112/64 | HR 80 | Ht 63.0 in | Wt 171.0 lb

## 2013-10-11 DIAGNOSIS — D62 Acute posthemorrhagic anemia: Secondary | ICD-10-CM

## 2013-10-11 DIAGNOSIS — K921 Melena: Secondary | ICD-10-CM

## 2013-10-11 NOTE — Progress Notes (Signed)
Reviewed and agree.

## 2013-10-11 NOTE — Progress Notes (Addendum)
Subjective:    Patient ID: Molly Black, female    DOB: April 30, 1941, 73 y.o.   MRN: [id number]  HPI Raaga is a pleasant 73 year old white female known to Dr. Delfin Edis from prior colonoscopy done in 2008. She was noted to have severe diverticulosis of the left colon. She states that she had a very remote EGD. Patient has history of stage II breast cancer diagnosed in 1989. She underwent a radical mastectomy and chemotherapy. She had problems last year with leakage from her implants and have them removed. She also has history of sleep apnea and osteoporosis.  Patient comes in today with a new problem of black stools which started last weekend. She says she woke up Sunday morning 10/08/2013 and felt weak lightheaded and somewhat dizzy but did not have any other symptoms. She says her family had trip to planned a trip to [place] so she went on a trip and did not have any further dizziness etc. That evening when she got back home she had a bowel movement that was very tarry and black. The following morning she had another very tarry black stool and yesterday again had one large tarry black stool. Today she says her stool was dark brown, not completely normal, but not nearly as black. She says she been having some mild epigastric discomfort no nausea or vomiting. She had not taken any Pepto-Bismol. She does take an occasional NSAID but nothing regular and had been on a baby aspirin daily which she has stopped since this started. She has history of reflux but had not been on a regular PPI and started her Nexium back 2 days ago. She was seen by Dr. Lorenda Hatchet at [place] yesterday, and referred here. She did have labs drawn yesterday which showed a hemoglobin of 11.2 hematocrit of 32.3 MCV of 90 and BUN 28 creatinine 0.65 LFTs were normal. Patient states she feels fine and denies any recurrent episodes of dizziness or lightheadedness.    Review of Systems  Constitutional: Negative.   HENT:  Negative.   Eyes: Negative.   Respiratory: Negative.   Cardiovascular: Negative.   Gastrointestinal: Positive for abdominal pain and blood in stool.  Endocrine: Negative.   Genitourinary: Negative.   Musculoskeletal: Negative.   Skin: Negative.   Allergic/Immunologic: Negative.   Neurological: Negative.   Hematological: Negative.   Psychiatric/Behavioral: Positive for agitation.   Outpatient Prescriptions Prior to Visit  Medication Sig Dispense Refill  . alendronate (FOSAMAX) 70 MG tablet Take 70 mg by mouth every 7 (seven) days. Take with a full glass of water on an empty stomach. On Saturday      . Calcium Carbonate-Vitamin D (CALCIUM 600+D PO) Take by mouth 2 (two) times daily.      . cephALEXin (KEFLEX) 500 MG capsule Take 1 capsule (500 mg total) by mouth every 6 (six) hours.  30 capsule  0  . Cholecalciferol (VITAMIN D3) 3000 UNITS TABS Take 1 tablet by mouth daily.      . Cyanocobalamin 2500 MCG TABS Take 1 tablet by mouth daily.      Marland Kitchen docusate sodium 100 MG CAPS Take 100 mg by mouth daily.  10 capsule  0  . esomeprazole (NEXIUM) 40 MG capsule Take 40 mg by mouth daily as needed (Acid Reflux).       Marland Kitchen HYDROmorphone (DILAUDID) 2 MG tablet Take 1-2 tablets (2-4 mg total) by mouth every 4 (four) hours as needed.  30 tablet  0  . methocarbamol (ROBAXIN) 500  MG tablet Take 1 tablet (500 mg total) by mouth 3 (three) times daily.  30 tablet  1  . Multiple Vitamin (MULTIVITAMIN) capsule Take 1 capsule by mouth daily.       No facility-administered medications prior to visit.   No Known Allergies Patient Active Problem List   Diagnosis Date Noted  . Closed dislocation of shoulder, unspecified site 01/19/2012  . Carcinoma of breast, stage 2, estrogen receptor negative, right 12/07/2011  . Hemangioma of liver 12/07/2011  . Neuropathy, peripheral 12/07/2011  . Diverticulosis of colon without diverticulitis 12/07/2011  . GERD (gastroesophageal reflux disease) 12/07/2011  .  Cystadenofibroma of ovary, unspecified laterality 12/07/2011   History  Substance Use Topics  . Smoking status: Former Smoker -- 0.50 packs/day for 18 years    Types: Cigarettes    Quit date: 07/27/1980  . Smokeless tobacco: Never Used  . Alcohol Use: No   family history includes Heart attack in her father; Melanoma in her brother; Stroke in her mother.     Objective:   Physical Exam  well-developed older white female in no acute distress blood pressure 112/64 pulse 80 height 5 foot 3 weight 171. HEENT ;nontraumatic normocephalic EOMI PERRLA sclera anicteric, Supple; no JVD, Cardiovascular; regular rate and rhythm with S1-S2 no murmur or gallop, Pulmonary; clear bilaterally, Abdomen; soft mildly tender in the epigastrium there is no guarding or rebound no palpable mass or hepatosplenomegaly bowel sounds are present, Rectal; exam very dark brown Hemoccult-positive stool, Extremities; no clubbing cyanosis or edema skin warm and dry, Psych; mood and affect normal and appropriate      Assessment & Plan:  #39 73 year old female with melena onset 5 days ago associated with dizziness and lightheadedness which has not recurred. No frank melena on exam today stool remains very dark and heme positive. Hemoglobin in 11 range and BUN elevated consistent with an upper GI source. Will rule out gastritis esophagitis peptic ulcer disease or occult upper gut lesion #2 diverticulosis #3 personal history of breast cancer remote   Plan; patient to stay off of aspirin and NSAIDs Repeat CBC today Continue Nexium 40 mg by mouth daily Patient is scheduled for upper endoscopy with Dr. Hilarie Fredrickson on Friday, 10/13/2013 at [facility] in order to expedite workup in light of recent melena. Procedure discussed in detail with the patient and she is agreeable to proceed. If EGD is unrevealing she will need to be scheduled for colonoscopy with Dr. Olevia Perches  Addendum: Reviewed and agree with initial management. Jerene Bears, MD

## 2013-10-11 NOTE — Patient Instructions (Addendum)
Please go to the basement level to have your labs drawn.  Continue Nexium 20 mg OTC once daily. Take no aspirin, Advil, Aleve.   You have been scheduled for an endoscopy with propofol. Please follow written instructions given to you at your visit today. Location: [facility] Unit, 1st floor. Go to patient registration inside front door of hospital. If you use inhalers (even only as needed), please bring them with you on the day of your procedure.

## 2013-10-12 ENCOUNTER — Encounter (HOSPITAL_COMMUNITY): Payer: Self-pay | Admitting: *Deleted

## 2013-10-12 ENCOUNTER — Encounter (HOSPITAL_COMMUNITY): Payer: Self-pay | Admitting: Pharmacy Technician

## 2013-10-12 LAB — HEMOGLOBIN AND HEMATOCRIT, BLOOD
HCT: 28 % — ABNORMAL LOW (ref 36.0–46.0)
HEMOGLOBIN: 9.5 g/dL — AB (ref 12.0–15.0)

## 2013-10-13 ENCOUNTER — Encounter (HOSPITAL_COMMUNITY): Payer: Self-pay | Admitting: Anesthesiology

## 2013-10-13 ENCOUNTER — Ambulatory Visit (HOSPITAL_COMMUNITY)
Admission: RE | Admit: 2013-10-13 | Discharge: 2013-10-13 | Disposition: A | Discharge status: Home / Self Care | Payer: Medicare Other | Source: Ambulatory Visit | Attending: Internal Medicine | Admitting: Internal Medicine

## 2013-10-13 ENCOUNTER — Ambulatory Visit (HOSPITAL_COMMUNITY): Payer: Medicare Other | Admitting: Anesthesiology

## 2013-10-13 ENCOUNTER — Encounter (HOSPITAL_COMMUNITY): Payer: Medicare Other | Admitting: Anesthesiology

## 2013-10-13 ENCOUNTER — Encounter (HOSPITAL_COMMUNITY)
Admission: RE | Disposition: A | Discharge status: Home / Self Care | Payer: Self-pay | Source: Ambulatory Visit | Attending: Internal Medicine

## 2013-10-13 DIAGNOSIS — Z853 Personal history of malignant neoplasm of breast: Secondary | ICD-10-CM | POA: Insufficient documentation

## 2013-10-13 DIAGNOSIS — K449 Diaphragmatic hernia without obstruction or gangrene: Secondary | ICD-10-CM | POA: Insufficient documentation

## 2013-10-13 DIAGNOSIS — K253 Acute gastric ulcer without hemorrhage or perforation: Secondary | ICD-10-CM

## 2013-10-13 DIAGNOSIS — Z79899 Other long term (current) drug therapy: Secondary | ICD-10-CM | POA: Insufficient documentation

## 2013-10-13 DIAGNOSIS — Z87891 Personal history of nicotine dependence: Secondary | ICD-10-CM | POA: Insufficient documentation

## 2013-10-13 DIAGNOSIS — K254 Chronic or unspecified gastric ulcer with hemorrhage: Secondary | ICD-10-CM | POA: Insufficient documentation

## 2013-10-13 DIAGNOSIS — E669 Obesity, unspecified: Secondary | ICD-10-CM | POA: Insufficient documentation

## 2013-10-13 DIAGNOSIS — K219 Gastro-esophageal reflux disease without esophagitis: Secondary | ICD-10-CM | POA: Insufficient documentation

## 2013-10-13 DIAGNOSIS — Z9010 Acquired absence of unspecified breast and nipple: Secondary | ICD-10-CM | POA: Insufficient documentation

## 2013-10-13 DIAGNOSIS — K921 Melena: Secondary | ICD-10-CM

## 2013-10-13 DIAGNOSIS — Z9221 Personal history of antineoplastic chemotherapy: Secondary | ICD-10-CM | POA: Insufficient documentation

## 2013-10-13 DIAGNOSIS — D649 Anemia, unspecified: Secondary | ICD-10-CM | POA: Insufficient documentation

## 2013-10-13 DIAGNOSIS — M810 Age-related osteoporosis without current pathological fracture: Secondary | ICD-10-CM | POA: Insufficient documentation

## 2013-10-13 DIAGNOSIS — R1013 Epigastric pain: Secondary | ICD-10-CM | POA: Insufficient documentation

## 2013-10-13 DIAGNOSIS — K5730 Diverticulosis of large intestine without perforation or abscess without bleeding: Secondary | ICD-10-CM | POA: Insufficient documentation

## 2013-10-13 DIAGNOSIS — G4730 Sleep apnea, unspecified: Secondary | ICD-10-CM | POA: Insufficient documentation

## 2013-10-13 DIAGNOSIS — K2900 Acute gastritis without bleeding: Secondary | ICD-10-CM | POA: Insufficient documentation

## 2013-10-13 DIAGNOSIS — D62 Acute posthemorrhagic anemia: Secondary | ICD-10-CM

## 2013-10-13 HISTORY — PX: ESOPHAGOGASTRODUODENOSCOPY: SHX5428

## 2013-10-13 SURGERY — EGD (ESOPHAGOGASTRODUODENOSCOPY)
Anesthesia: Monitor Anesthesia Care

## 2013-10-13 MED ORDER — SODIUM CHLORIDE 0.9 % IV SOLN: INTRAVENOUS | Status: DC

## 2013-10-13 MED ORDER — PROPOFOL INFUSION 10 MG/ML OPTIME
INTRAVENOUS | Status: DC | PRN
  Administered 2013-10-13: 75 ug/kg/min via INTRAVENOUS

## 2013-10-13 MED ORDER — PROPOFOL 10 MG/ML IV BOLUS
INTRAVENOUS | Status: AC
  Filled 2013-10-13: qty 40

## 2013-10-13 MED ORDER — KETAMINE HCL 10 MG/ML IJ SOLN
INTRAMUSCULAR | Status: AC
  Filled 2013-10-13: qty 1

## 2013-10-13 MED ORDER — KETAMINE HCL 10 MG/ML IJ SOLN
INTRAMUSCULAR | Status: DC | PRN
Start: 2013-10-13 — End: 2013-10-13
  Administered 2013-10-13: 20 mg via INTRAVENOUS
  Administered 2013-10-13: 10 mg via INTRAVENOUS

## 2013-10-13 MED ORDER — LACTATED RINGERS IV SOLN
INTRAVENOUS | Status: DC
  Administered 2013-10-13: 1000 mL via INTRAVENOUS

## 2013-10-13 MED ORDER — LACTATED RINGERS IV SOLN
INTRAVENOUS | Status: DC | PRN
  Administered 2013-10-13: 09:00:00 via INTRAVENOUS

## 2013-10-13 MED ORDER — PROPOFOL 10 MG/ML IV BOLUS
INTRAVENOUS | Status: DC | PRN
  Administered 2013-10-13: 40 mg via INTRAVENOUS
  Administered 2013-10-13: 20 mg via INTRAVENOUS
  Administered 2013-10-13: 40 mg via INTRAVENOUS

## 2013-10-13 NOTE — H&P (View-Only) (Signed)
Subjective:    Patient ID: Molly Black, female    DOB: 1941/02/10, 73 y.o.   MRN: [id number]  HPI Amber is a pleasant 73 year old white female known to Dr. Delfin Edis from prior colonoscopy done in 2008. She was noted to have severe diverticulosis of the left colon. She states that she had a very remote EGD. Patient has history of stage II breast cancer diagnosed in 1989. She underwent a radical mastectomy and chemotherapy. She had problems last year with leakage from her implants and have them removed. She also has history of sleep apnea and osteoporosis.  Patient comes in today with a new problem of black stools which started last weekend. She says she woke up Sunday morning 10/08/2013 and felt weak lightheaded and somewhat dizzy but did not have any other symptoms. She says her family had trip to planned a trip to [place] so she went on a trip and did not have any further dizziness etc. That evening when she got back home she had a bowel movement that was very tarry and black. The following morning she had another very tarry black stool and yesterday again had one large tarry black stool. Today she says her stool was dark brown, not completely normal, but not nearly as black. She says she been having some mild epigastric discomfort no nausea or vomiting. She had not taken any Pepto-Bismol. She does take an occasional NSAID but nothing regular and had been on a baby aspirin daily which she has stopped since this started. She has history of reflux but had not been on a regular PPI and started her Nexium back 2 days ago. She was seen by Dr. Lorenda Hatchet at [facility] yesterday, and referred here. She did have labs drawn yesterday which showed a hemoglobin of 11.2 hematocrit of 32.3 MCV of 90 and BUN 28 creatinine 0.65 LFTs were normal. Patient states she feels fine and denies any recurrent episodes of dizziness or lightheadedness.    Review of Systems  Constitutional: Negative.   HENT:  Negative.   Eyes: Negative.   Respiratory: Negative.   Cardiovascular: Negative.   Gastrointestinal: Positive for abdominal pain and blood in stool.  Endocrine: Negative.   Genitourinary: Negative.   Musculoskeletal: Negative.   Skin: Negative.   Allergic/Immunologic: Negative.   Neurological: Negative.   Hematological: Negative.   Psychiatric/Behavioral: Positive for agitation.   Outpatient Prescriptions Prior to Visit  Medication Sig Dispense Refill  . alendronate (FOSAMAX) 70 MG tablet Take 70 mg by mouth every 7 (seven) days. Take with a full glass of water on an empty stomach. On Saturday      . Calcium Carbonate-Vitamin D (CALCIUM 600+D PO) Take by mouth 2 (two) times daily.      . cephALEXin (KEFLEX) 500 MG capsule Take 1 capsule (500 mg total) by mouth every 6 (six) hours.  30 capsule  0  . Cholecalciferol (VITAMIN D3) 3000 UNITS TABS Take 1 tablet by mouth daily.      . Cyanocobalamin 2500 MCG TABS Take 1 tablet by mouth daily.      Marland Kitchen docusate sodium 100 MG CAPS Take 100 mg by mouth daily.  10 capsule  0  . esomeprazole (NEXIUM) 40 MG capsule Take 40 mg by mouth daily as needed (Acid Reflux).       Marland Kitchen HYDROmorphone (DILAUDID) 2 MG tablet Take 1-2 tablets (2-4 mg total) by mouth every 4 (four) hours as needed.  30 tablet  0  . methocarbamol (ROBAXIN) 500  MG tablet Take 1 tablet (500 mg total) by mouth 3 (three) times daily.  30 tablet  1  . Multiple Vitamin (MULTIVITAMIN) capsule Take 1 capsule by mouth daily.       No facility-administered medications prior to visit.   No Known Allergies Patient Active Problem List   Diagnosis Date Noted  . Closed dislocation of shoulder, unspecified site 01/19/2012  . Carcinoma of breast, stage 2, estrogen receptor negative, right 12/07/2011  . Hemangioma of liver 12/07/2011  . Neuropathy, peripheral 12/07/2011  . Diverticulosis of colon without diverticulitis 12/07/2011  . GERD (gastroesophageal reflux disease) 12/07/2011  .  Cystadenofibroma of ovary, unspecified laterality 12/07/2011   History  Substance Use Topics  . Smoking status: Former Smoker -- 0.50 packs/day for 18 years    Types: Cigarettes    Quit date: 07/27/1980  . Smokeless tobacco: Never Used  . Alcohol Use: No   family history includes Heart attack in her father; Melanoma in her brother; Stroke in her mother.     Objective:   Physical Exam  well-developed older white female in no acute distress blood pressure 112/64 pulse 80 height 5 foot 3 weight 171. HEENT ;nontraumatic normocephalic EOMI PERRLA sclera anicteric, Supple; no JVD, Cardiovascular; regular rate and rhythm with S1-S2 no murmur or gallop, Pulmonary; clear bilaterally, Abdomen; soft mildly tender in the epigastrium there is no guarding or rebound no palpable mass or hepatosplenomegaly bowel sounds are present, Rectal; exam very dark brown Hemoccult-positive stool, Extremities; no clubbing cyanosis or edema skin warm and dry, Psych; mood and affect normal and appropriate      Assessment & Plan:  #51 73 year old female with melena onset 5 days ago associated with dizziness and lightheadedness which has not recurred. No frank melena on exam today stool remains very dark and heme positive. Hemoglobin in 11 range and BUN elevated consistent with an upper GI source. Will rule out gastritis esophagitis peptic ulcer disease or occult upper gut lesion #2 diverticulosis #3 personal history of breast cancer remote   Plan; patient to stay off of aspirin and NSAIDs Repeat CBC today Continue Nexium 40 mg by mouth daily Patient is scheduled for upper endoscopy with Dr. Hilarie Fredrickson on Friday, 10/13/2013 at [facility] in order to expedite workup in light of recent melena. Procedure discussed in detail with the patient and she is agreeable to proceed. If EGD is unrevealing she will need to be scheduled for colonoscopy with Dr. Olevia Perches  Addendum: Reviewed and agree with initial management. Jerene Bears, MD

## 2013-10-13 NOTE — Transfer of Care (Signed)
Immediate Anesthesia Transfer of Care Note  Patient: Molly Black [facility]  Procedure(s) Performed: Procedure(s) (LRB): ESOPHAGOGASTRODUODENOSCOPY (EGD) (N/A)  Patient Location: PACU  Anesthesia Type: MAC  Level of Consciousness: sedated, patient cooperative and responds to stimulation  Airway & Oxygen Therapy: Patient Spontanous Breathing and Patient connected to face mask oxgen  Post-op Assessment: Report given to PACU RN and Post -op Vital signs reviewed and stable  Post vital signs: Reviewed and stable  Complications: No apparent anesthesia complications

## 2013-10-13 NOTE — Anesthesia Postprocedure Evaluation (Signed)
  Anesthesia Post-op Note  Patient: Cammy Sanjurjo [facility]  Procedure(s) Performed: Procedure(s) (LRB): ESOPHAGOGASTRODUODENOSCOPY (EGD) (N/A)  Patient Location: PACU  Anesthesia Type: MAC  Level of Consciousness: awake and alert   Airway and Oxygen Therapy: Patient Spontanous Breathing  Post-op Pain: mild  Post-op Assessment: Post-op Vital signs reviewed, Patient's Cardiovascular Status Stable, Respiratory Function Stable, Patent Airway and No signs of Nausea or vomiting  Last Vitals:  Filed Vitals:   10/13/13 1020  BP: 127/73  Pulse:   Temp:   Resp: 13    Post-op Vital Signs: stable   Complications: No apparent anesthesia complications

## 2013-10-13 NOTE — Op Note (Signed)
[facility] [place], [address]   ENDOSCOPY PROCEDURE REPORT  PATIENT: Molly Black, Molly Black  MR#: [id number] BIRTHDATE: 06/20/1941 , 63  yrs. old GENDER: Female ENDOSCOPIST: Jerene Bears, MD PROCEDURE DATE:  10/13/2013 PROCEDURE:  EGD w/ biopsy ASA CLASS:     Class III INDICATIONS:  Anemia.   Melena. MEDICATIONS: MAC sedation, administered by CRNA and See Anesthesia Report. TOPICAL ANESTHETIC: none  DESCRIPTION OF PROCEDURE: After the risks benefits and alternatives of the procedure were thoroughly explained, informed consent was obtained.  The Pentax Gastroscope [id number] endoscope was introduced through the mouth and advanced to the second portion of the duodenum. Without limitations.  The instrument was slowly withdrawn as the mucosa was fully examined.    ESOPHAGUS: The mucosa of the esophagus appeared normal.   A 3 cm hiatal hernia was noted with partial non-obstructing Schatzki's ring.  STOMACH: A single non-bleeding round, somewhat deep and clean-based ulcer, measuring 10 x 5mm in size, was found in the gastric antrum.  Biopsies were taken at edge of the ulcer.   Mild acute gastritis (inflammation) was found in the gastric antrum.  Biopsies were taken in the gastric body, antrum and angularis.  DUODENUM: The duodenal mucosa showed no abnormalities in the bulb and second portion of the duodenum.  Retroflexed views revealed a hiatal hernia.     The scope was then withdrawn from the patient and the procedure completed.  COMPLICATIONS: There were no complications. ENDOSCOPIC IMPRESSION: 1.   The mucosa of the esophagus appeared normal 2.   3 cm hiatal hernia 3.   Single non-bleeding ulcer, measuring 10 x 52mm in size, was found in the gastric antrum 4.   Acute gastritis (inflammation) was found in the gastric antrum; biopsies were taken in the antrum and angularis 5.   The duodenal mucosa showed no abnormalities in the bulb and second  portion of the duodenum  RECOMMENDATIONS: 1.  Avoid NSAIDs 2.  Await biopsy results 3.  Follow-up of helicobacter pylori status, treat if indicated 4.  Consider repeat EGD in 8-12 weeks to document healing eSigned:  Jerene Bears, MD 10/13/2013 10:01 AM   CC:The Patient, Trellis Paganini, Amy PA-C, and Delfin Edis, MD  PATIENT NAME:  Molly Black, Molly Black MR#: [id number]

## 2013-10-13 NOTE — Interval H&P Note (Signed)
History and Physical Interval Note: Pt presents for EGD after recent melena.  Melena has resolved.  Some epigastric discomfort and vomiting last night.  Nonbloody. Plan EGD today The nature of the procedure, as well as the risks, benefits, and alternatives were carefully and thoroughly reviewed with the patient. Ample time for discussion and questions allowed. The patient understood, was satisfied, and agreed to proceed.    10/13/2013 9:28 AM  Molly Black  has presented today for surgery, with the diagnosis of Melena 578.1 Anemia 285.9  The various methods of treatment have been discussed with the patient and family. After consideration of risks, benefits and other options for treatment, the patient has consented to  Procedure(s): ESOPHAGOGASTRODUODENOSCOPY (EGD) (N/A) as a surgical intervention .  The patient's history has been reviewed, patient examined, no change in status, stable for surgery.  I have reviewed the patient's chart and labs.  Questions were answered to the patient's satisfaction.     Molly Black

## 2013-10-13 NOTE — Anesthesia Preprocedure Evaluation (Signed)
Anesthesia Evaluation  Patient identified by MRN, date of birth, ID band Patient awake    Reviewed: Allergy & Precautions, H&P , NPO status , Patient's Chart, lab work & pertinent test results  History of Anesthesia Complications (+) PONV and history of anesthetic complications  Airway Mallampati: II TM Distance: >3 FB Neck ROM: Full    Dental no notable dental hx.    Pulmonary shortness of breath, sleep apnea and Continuous Positive Airway Pressure Ventilation , pneumonia -, former smoker,  breath sounds clear to auscultation  Pulmonary exam normal       Cardiovascular Rhythm:Regular Rate:Normal     Neuro/Psych Anxiety  Neuromuscular disease negative psych ROS   GI/Hepatic Neg liver ROS, hiatal hernia, GERD-  ,  Endo/Other  negative endocrine ROS  Renal/GU negative Renal ROS  negative genitourinary   Musculoskeletal negative musculoskeletal ROS (+)   Abdominal (+) + obese,   Peds negative pediatric ROS (+)  Hematology negative hematology ROS (+)   Anesthesia Other Findings   Reproductive/Obstetrics negative OB ROS                           Anesthesia Physical Anesthesia Plan  ASA: III  Anesthesia Plan: MAC   Post-op Pain Management:    Induction: Intravenous  Airway Management Planned:   Additional Equipment:   Intra-op Plan:   Post-operative Plan:   Informed Consent: I have reviewed the patients History and Physical, chart, labs and discussed the procedure including the risks, benefits and alternatives for the proposed anesthesia with the patient or authorized representative who has indicated his/her understanding and acceptance.   Dental advisory given  Plan Discussed with: CRNA  Anesthesia Plan Comments:         Anesthesia Quick Evaluation

## 2013-10-13 NOTE — Discharge Instructions (Signed)

## 2013-10-16 ENCOUNTER — Encounter (HOSPITAL_COMMUNITY): Payer: Self-pay | Admitting: Internal Medicine

## 2013-10-19 ENCOUNTER — Telehealth: Payer: Self-pay | Admitting: Internal Medicine

## 2013-10-19 ENCOUNTER — Encounter: Payer: Self-pay | Admitting: Internal Medicine

## 2013-10-20 NOTE — Telephone Encounter (Signed)
Patient given results and recommendations as per letter on 10/19/13.

## 2013-10-26 ENCOUNTER — Encounter: Payer: Self-pay | Admitting: *Deleted

## 2013-11-08 ENCOUNTER — Other Ambulatory Visit (INDEPENDENT_AMBULATORY_CARE_PROVIDER_SITE_OTHER): Payer: Medicare Other

## 2013-11-08 ENCOUNTER — Ambulatory Visit (INDEPENDENT_AMBULATORY_CARE_PROVIDER_SITE_OTHER): Payer: Medicare Other | Admitting: Internal Medicine

## 2013-11-08 ENCOUNTER — Encounter: Payer: Self-pay | Admitting: Internal Medicine

## 2013-11-08 VITALS — BP 132/80 | HR 60 | Ht 63.0 in | Wt 172.2 lb

## 2013-11-08 DIAGNOSIS — K259 Gastric ulcer, unspecified as acute or chronic, without hemorrhage or perforation: Secondary | ICD-10-CM

## 2013-11-08 DIAGNOSIS — D62 Acute posthemorrhagic anemia: Secondary | ICD-10-CM

## 2013-11-08 LAB — CBC WITH DIFFERENTIAL/PLATELET
BASOS PCT: 0.7 % (ref 0.0–3.0)
Basophils Absolute: 0 10*3/uL (ref 0.0–0.1)
EOS ABS: 0.1 10*3/uL (ref 0.0–0.7)
Eosinophils Relative: 2 % (ref 0.0–5.0)
HCT: 33.4 % — ABNORMAL LOW (ref 36.0–46.0)
Hemoglobin: 11.3 g/dL — ABNORMAL LOW (ref 12.0–15.0)
Lymphocytes Relative: 21.3 % (ref 12.0–46.0)
Lymphs Abs: 1.3 10*3/uL (ref 0.7–4.0)
MCHC: 33.7 g/dL (ref 30.0–36.0)
MCV: 85.6 fl (ref 78.0–100.0)
MONO ABS: 0.5 10*3/uL (ref 0.1–1.0)
Monocytes Relative: 7.7 % (ref 3.0–12.0)
NEUTROS PCT: 68.3 % (ref 43.0–77.0)
Neutro Abs: 4.1 10*3/uL (ref 1.4–7.7)
Platelets: 308 10*3/uL (ref 150.0–400.0)
RBC: 3.9 Mil/uL (ref 3.87–5.11)
RDW: 14.7 % — AB (ref 11.5–14.6)
WBC: 6 10*3/uL (ref 4.5–10.5)

## 2013-11-08 LAB — IBC PANEL
Iron: 78 ug/dL (ref 42–145)
Saturation Ratios: 18.8 % — ABNORMAL LOW (ref 20.0–50.0)
TRANSFERRIN: 296.1 mg/dL (ref 212.0–360.0)

## 2013-11-08 MED ORDER — OMEPRAZOLE 40 MG PO CPDR: 40.0000 mg | DELAYED_RELEASE_CAPSULE | Freq: Every day | ORAL | Status: DC

## 2013-11-08 NOTE — Progress Notes (Signed)
[place] 1941-04-26 [id number]  Note: This dictation was prepared with Dragon digital system. Any transcriptional errors that result from this procedure are unintentional.   History of Present Illness:  This is a 73 year old white female who is post hospitalization for upper GI bleed after presenting with melena and a hemoglobin of 9 g. She was found to have a benign appearing 10 mm antral ulcer which on biopsies by Dr Hilarie Fredrickson  showed reactive changes. She was negative for H. pylori. She has been on Nexium 40 mg twice a day and has been feeling fine except for occasional nausea and generalized weakness. She denies melenic stools. She has been on Fosamax 70 mg weekly for several years. She is due for a repeat bone density by Dr. Laurann Montana. We have seen her in the past for colorectal screening. Her last colonoscopy in 2008 showed severe diverticulosis. She was treated for stage IB breast cancer in 1989.    Past Medical History  Diagnosis Date  . Hemangioma of liver   . Neuropathy, peripheral   . Diverticulosis of colon without diverticulitis   . Cystadenofibroma of ovary, unspecified laterality   . PONV (postoperative nausea and vomiting)   . Shortness of breath   . GERD (gastroesophageal reflux disease)     takes Nexium daily  . Anxiety     with death of brother none since  . Pneumonia     at age 65 years old  . H/O hiatal hernia   . Carcinoma of breast, stage 2, estrogen receptor negative, right     T2N0 right breast mastectomy/TRAM reconstruction ER negative PR positive  June 1989  Then [facility] chemo  . Skin cancer of face     "had some places frozen off" (04/13/2013)  . OSA on CPAP     setting 15- uses occ.  Marland Kitchen Hiatal hernia   . Gastric ulcer     Past Surgical History  Procedure Laterality Date  . Abdominal hysterectomy  1989  . Ankle fracture surgery Right ?1996  . Wrist fracture surgery Right ~ 2007    "put a pin in it" (04/13/2013)  . Cataract extraction w/phaco Right  10/18/2012    Procedure: CATARACT EXTRACTION PHACO AND INTRAOCULAR LENS PLACEMENT (IOC);  Surgeon: Elta Guadeloupe T. Gershon Crane, MD;  Location: [facility];  Service: Ophthalmology;  Laterality: Right;  CDE:7.67  . Cataract extraction w/phaco Left 11/01/2012    Procedure: CATARACT EXTRACTION PHACO AND INTRAOCULAR LENS PLACEMENT (IOC);  Surgeon: Elta Guadeloupe T. Gershon Crane, MD;  Location: [facility];  Service: Ophthalmology;  Laterality: Left;  CDE:9.92  . Breast capsulectomy with implant exchange Right 6/38/7564    "silicon gel implant" (3/32/9518)  . Cholecystectomy  1990's  . Breast reconstruction Right   . Breast lumpectomy Right 1963; 1981; 1989    "benign; benign; malignant"  . Breast biopsy Right 1963; 1981; 1989  . Mastectomy complete / simple w/ sentinel node biopsy Right 1989  . Breast reconstruction with placement of tissue expander and flex hd (acellular hydrated dermis) Right 1989  . Reconstruction / correction of nipple / aerola Right 1989  . Breast implant removal Right 04/13/2013    Procedure: REMOVAL RIGHT RUPTURED BREAST IMPLANTS, DELAYED BREAST RECONSTRUCTION WITH SILICONE GEL IMPLANTS;  Surgeon: Crissie Reese, MD;  Location: [facility];  Service: Plastics;  Laterality: Right;  . Capsulectomy Right 04/13/2013    Procedure: CAPSULECTOMY;  Surgeon: Crissie Reese, MD;  Location: [facility];  Service: Plastics;  Laterality: Right;  . Esophagogastroduodenoscopy N/A 10/13/2013    Procedure:  ESOPHAGOGASTRODUODENOSCOPY (EGD);  Surgeon: Jerene Bears, MD;  Location: [facility];  Service: Gastroenterology;  Laterality: N/A;    No Known Allergies  Family history and social history have been reviewed.  Review of Systems: Negative for dysphagia, abdominal pain or melena  The remainder of the 10 point ROS is negative except as outlined in the H&P  Physical Exam: General Appearance Well developed, in no distress Eyes  Non icteric  HEENT  Non traumatic, normocephalic  Mouth No lesion, tongue papillated, no cheilosis Neck Supple  without adenopathy, thyroid not enlarged, no carotid bruits, no JVD Lungs Clear to auscultation bilaterally COR Normal S1, normal S2, regular rhythm, no murmur, quiet precordium Abdomen protuberant soft nontender with normoactive bowel sounds. Post cholecystectomy scar Rectal not done Extremities  No pedal edema Skin No lesions Neurological Alert and oriented x 3 Psychological Normal mood and affect  Assessment and Plan:   Problem #23 73 year old, white female 3 weeks post GI bleed from antral ulcer. Etiology is not clear.Biopsies showed reactive gastropathy, H. pylori negative. It could possibly be related to Fosamax. She took occasional Aleve but not on a regular basis. We will recheck her blood count and iron levels today and will reduce her Nexium to 40 mg daily. I have asked her to stop Fosamax at this time., Since it causes GI intolerance.  We will repeat her upper endoscopy in 4 weeks to ensure complete healing of the ulcer. Because of the expense associated with Nexium, we will switch her to Prilosec 40 mg daily. She will continue calcium supplements.  Problem#2 breast colon cancer. In remission  Problems #3 colorectal screening. She's up to date   Lafayette Dragon 11/08/2013

## 2013-11-08 NOTE — Patient Instructions (Addendum)
You have been scheduled for an endoscopy with propofol. Please follow written instructions given to you at your visit today. If you use inhalers (even only as needed), please bring them with you on the day of your procedure. Your physician has requested that you go to www.startemmi.com and enter the access code given to you at your visit today. This web site gives a general overview about your procedure. However, you should still follow specific instructions given to you by our office regarding your preparation for the procedure.  Your physician has requested that you go to the basement for the following lab work before leaving today: CBC, IBC  Please decrease Nexium to 40 mg once daily. Once our of Nexium, start omeprazole.  We have sent the following medications to your pharmacy for you to pick up at your convenience: Omeprazole 40 mg daily  Please discontinue Fosamax.  CC:Dr [name].

## 2013-11-09 ENCOUNTER — Encounter: Payer: Self-pay | Admitting: Internal Medicine

## 2013-11-15 ENCOUNTER — Encounter: Payer: Self-pay | Admitting: Internal Medicine

## 2013-11-30 ENCOUNTER — Ambulatory Visit
Admission: RE | Admit: 2013-11-30 | Discharge: 2013-11-30 | Disposition: A | Discharge status: Home / Self Care | Payer: Medicare Other | Source: Ambulatory Visit | Attending: Oncology | Admitting: Oncology

## 2013-11-30 ENCOUNTER — Ambulatory Visit: Payer: Medicare Other

## 2013-11-30 DIAGNOSIS — Z171 Estrogen receptor negative status [ER-]: Principal | ICD-10-CM

## 2013-11-30 DIAGNOSIS — C50911 Malignant neoplasm of unspecified site of right female breast: Secondary | ICD-10-CM

## 2013-12-04 ENCOUNTER — Other Ambulatory Visit: Payer: Medicare Other

## 2013-12-04 ENCOUNTER — Ambulatory Visit: Payer: Medicare Other | Admitting: Oncology

## 2013-12-20 ENCOUNTER — Encounter: Payer: Self-pay | Admitting: Internal Medicine

## 2013-12-20 ENCOUNTER — Ambulatory Visit (AMBULATORY_SURGERY_CENTER): Payer: Medicare Other | Admitting: Internal Medicine

## 2013-12-20 VITALS — BP 149/92 | HR 53 | Temp 97.9°F | Resp 22 | Ht 63.0 in | Wt 172.0 lb

## 2013-12-20 DIAGNOSIS — K2100 Gastro-esophageal reflux disease with esophagitis, without bleeding: Secondary | ICD-10-CM

## 2013-12-20 DIAGNOSIS — K259 Gastric ulcer, unspecified as acute or chronic, without hemorrhage or perforation: Secondary | ICD-10-CM

## 2013-12-20 DIAGNOSIS — K2940 Chronic atrophic gastritis without bleeding: Secondary | ICD-10-CM

## 2013-12-20 MED ORDER — SUCRALFATE 1 G PO TABS: 1.0000 g | ORAL_TABLET | Freq: Two times a day (BID) | ORAL | Status: DC

## 2013-12-20 MED ORDER — OMEPRAZOLE 40 MG PO CPDR: 40.0000 mg | DELAYED_RELEASE_CAPSULE | Freq: Two times a day (BID) | ORAL | Status: DC

## 2013-12-20 MED ORDER — SODIUM CHLORIDE 0.9 % IV SOLN: 500.0000 mL | INTRAVENOUS | Status: DC

## 2013-12-20 NOTE — Progress Notes (Signed)
Called to room to assist during endoscopic procedure.  Patient ID and intended procedure confirmed with present staff. Received instructions for my participation in the procedure from the performing physician.  

## 2013-12-20 NOTE — Progress Notes (Signed)
To PACU awake and alert, report to RN.

## 2013-12-20 NOTE — Op Note (Signed)
[facility]  [address] [place], [address]   ENDOSCOPY PROCEDURE REPORT  PATIENT: Molly Black, Molly Black  MR#: [id number] BIRTHDATE: [birth year] , 72  yrs. old GENDER: Female ENDOSCOPIST: Lafayette Dragon, MD REFERRED BY:  [name], M.D. PROCEDURE DATE:  12/20/2013 PROCEDURE:  EGD w/ biopsy ASA CLASS:     Class III INDICATIONS:  followup gastric ulcer.  Status post upper GI bleed in March 2015.  Shallow antral ulcer negative for H.  pylori.  Patient has done well on omeprazole 40 mg a day, Fosamax discontinued. MEDICATIONS: MAC sedation, administered by CRNA and Propofol (Diprivan) 130 mg IV TOPICAL ANESTHETIC: none  DESCRIPTION OF PROCEDURE: After the risks benefits and alternatives of the procedure were thoroughly explained, informed consent was obtained.  The LB [id number] endoscope was introduced through the mouth and advanced to the second portion of the duodenum. Without limitations.  The instrument was slowly withdrawn as the mucosa was fully examined.      [Esophagus, proximal and mid esophageal mucosa was normal. With 2 linear erosions at the distal esophagus at the GE junction consistent with mild reflux esophagitis. There was no stricture. Stomach: Body of the stomach was normal including rugal pattern. Retroflexion of the endoscope revealed normal fundus and cardia. In the gastric antrum there was a shallow 8 mm ulcer in the prepyloric antrum which was covered with the exudate. Biopsies were taken from the ulcer which did not show any stigmatic of recent bleeding. There was satellite erosions around the ulcer. Pyloric outlet was normal. There was no retained food or liquid in the stomach duodenum duodenal bulb and descending duodenum was normal          The scope was then withdrawn from the patient and the procedure completed.  COMPLICATIONS: There were no complications. ENDOSCOPIC IMPRESSION:  persistent gastric ulcer in the antrum,  appears benign. s/p biopsies grade a esophagitis  RECOMMENDATIONS: 1.  Await pathology results 2.  increase omeprazole to 40 mg by mouth twice a day Add Carafate 1 g by mouth twice a day Continue to hold Fosamax Avoid any anti-inflammatory agents Consider repeat endoscopy in 3 months  REPEAT EXAM: for EGD pending biopsy results.  eSigned:  Lafayette Dragon, MD 12/20/2013 8:56 AM   CC:  PATIENT NAME:  Molly Black, Molly Black MR#: [id number]

## 2013-12-20 NOTE — Patient Instructions (Signed)
YOU HAD AN ENDOSCOPIC PROCEDURE TODAY AT THE [facility]: Refer to the procedure report that was given to you for any specific questions about what was found during the examination.  If the procedure report does not answer your questions, please call your gastroenterologist to clarify.  If you requested that your care partner not be given the details of your procedure findings, then the procedure report has been included in a sealed envelope for you to review at your convenience later.  YOU SHOULD EXPECT: Some feelings of bloating in the abdomen. Passage of more gas than usual.  Walking can help get rid of the air that was put into your GI tract during the procedure and reduce the bloating. If you had a lower endoscopy (such as a colonoscopy or flexible sigmoidoscopy) you may notice spotting of blood in your stool or on the toilet paper. If you underwent a bowel prep for your procedure, then you may not have a normal bowel movement for a few days.  DIET: Your first meal following the procedure should be a light meal and then it is ok to progress to your normal diet.  A half-sandwich or bowl of soup is an example of a good first meal.  Heavy or fried foods are harder to digest and may make you feel nauseous or bloated.  Likewise meals heavy in dairy and vegetables can cause extra gas to form and this can also increase the bloating.  Drink plenty of fluids but you should avoid alcoholic beverages for 24 hours.  ACTIVITY: Your care partner should take you home directly after the procedure.  You should plan to take it easy, moving slowly for the rest of the day.  You can resume normal activity the day after the procedure however you should NOT DRIVE or use heavy machinery for 24 hours (because of the sedation medicines used during the test).    SYMPTOMS TO REPORT IMMEDIATELY: A gastroenterologist can be reached at any hour.  During normal business hours, 8:30 AM to 5:00 PM Monday through Friday,  call [phone].  After hours and on weekends, please call the GI answering service at [phone] who will take a message and have the physician on call contact you.     Following upper endoscopy (EGD)  Vomiting of blood or coffee ground material  New chest pain or pain under the shoulder blades  Painful or persistently difficult swallowing  New shortness of breath  Fever of 100F or higher  Black, tarry-looking stools  FOLLOW UP: If any biopsies were taken you will be contacted by phone or by letter within the next 1-3 weeks.  Call your gastroenterologist if you have not heard about the biopsies in 3 weeks.  Our staff will call the home number listed on your records the next business day following your procedure to check on you and address any questions or concerns that you may have at that time regarding the information given to you following your procedure. This is a courtesy call and so if there is no answer at the home number and we have not heard from you through the emergency physician on call, we will assume that you have returned to your regular daily activities without incident.  SIGNATURES/CONFIDENTIALITY: You and/or your care partner have signed paperwork which will be entered into your electronic medical record.  These signatures attest to the fact that that the information above on your After Visit Summary has been reviewed and is understood.    Full responsibility of the confidentiality of this discharge information lies with you and/or your care-partner.    Hold Fosamax.  You may resume your other current medications today.  Avoid any anti-inflammatory medications. Increase omeprazole 40 mg by mouth twice per day. Add carafate 1 gm by mouth twice per day. Written rx given to patient. Call if any questions or concerns.

## 2013-12-20 NOTE — Progress Notes (Signed)
No problems noted in the recovery room. maw 

## 2013-12-21 ENCOUNTER — Telehealth: Payer: Self-pay | Admitting: *Deleted

## 2013-12-21 NOTE — Telephone Encounter (Signed)
  Follow up Call-  Call back number 12/20/2013  Post procedure Call Back phone  # [phone]  Permission to leave phone message Yes     Patient questions:  Do you have a fever, pain , or abdominal swelling? no Pain Score  0 *  Have you tolerated food without any problems? yes  Have you been able to return to your normal activities? yes  Do you have any questions about your discharge instructions: Diet   no Medications  no Follow up visit  no  Do you have questions or concerns about your Care? no  Actions: * If pain score is 4 or above: No action needed, pain <4.

## 2013-12-25 ENCOUNTER — Encounter: Payer: Self-pay | Admitting: Internal Medicine

## 2013-12-26 ENCOUNTER — Encounter: Payer: Self-pay | Admitting: *Deleted

## 2014-03-29 ENCOUNTER — Encounter: Payer: Self-pay | Admitting: Internal Medicine

## 2014-05-11 ENCOUNTER — Other Ambulatory Visit: Payer: Self-pay

## 2014-06-20 ENCOUNTER — Other Ambulatory Visit: Payer: Self-pay | Admitting: Internal Medicine

## 2014-06-20 DIAGNOSIS — Z1231 Encounter for screening mammogram for malignant neoplasm of breast: Secondary | ICD-10-CM

## 2014-12-03 ENCOUNTER — Ambulatory Visit
Admission: RE | Admit: 2014-12-03 | Discharge: 2014-12-03 | Disposition: A | Discharge status: Home / Self Care | Payer: Medicare Other | Source: Ambulatory Visit | Attending: Internal Medicine | Admitting: Internal Medicine

## 2014-12-03 DIAGNOSIS — Z1231 Encounter for screening mammogram for malignant neoplasm of breast: Secondary | ICD-10-CM

## 2015-01-21 ENCOUNTER — Other Ambulatory Visit: Payer: Self-pay

## 2015-03-13 ENCOUNTER — Encounter: Payer: Self-pay | Admitting: Internal Medicine

## 2015-03-13 ENCOUNTER — Ambulatory Visit (INDEPENDENT_AMBULATORY_CARE_PROVIDER_SITE_OTHER): Payer: Medicare Other | Admitting: Internal Medicine

## 2015-03-13 VITALS — BP 132/84 | HR 60 | Ht 63.0 in | Wt 169.0 lb

## 2015-03-13 DIAGNOSIS — K921 Melena: Secondary | ICD-10-CM

## 2015-03-13 DIAGNOSIS — Z8719 Personal history of other diseases of the digestive system: Secondary | ICD-10-CM

## 2015-03-13 DIAGNOSIS — K625 Hemorrhage of anus and rectum: Secondary | ICD-10-CM | POA: Diagnosis not present

## 2015-03-13 DIAGNOSIS — Z8711 Personal history of peptic ulcer disease: Secondary | ICD-10-CM

## 2015-03-13 MED ORDER — OMEPRAZOLE 40 MG PO CPDR: 40.0000 mg | DELAYED_RELEASE_CAPSULE | Freq: Two times a day (BID) | ORAL | Status: DC

## 2015-03-13 MED ORDER — SUCRALFATE 1 G PO TABS: 1.0000 g | ORAL_TABLET | Freq: Two times a day (BID) | ORAL | Status: DC

## 2015-03-13 NOTE — Progress Notes (Signed)
[place] 30-Sep-1940 [id number]  Note: This dictation was prepared with Dragon digital system. Any transcriptional errors that result from this procedure are unintentional.  History of present illness  :Dr [name] 74 year old white female with  history of bleeding gastric ulcer in March 2015 ,now presenting with 3 days of rectal bleeding and several days of epigastric pain. The pain radiates to intrascapular area. Denies melena. There has been no weight loss.In March 2015 she was found to have a 8 mm shallow ulcer in the gastric antrum which was H. pylori negative  and was attributed to Fosamax. Her hemoglobin dropped to 9.0. Follow-up EGD  6 weeks later showed healing of the ulcer which was still present but appeared smaller. She continued to take Prilosec 40 mg twice a day and Carafate. She denies having any interim pain until recently. Her colonoscopy in July 2008 and in 2000 showed moderately severe diverticulosis. She has  A history of breast cancer. Patient doesn't smoke ,doesn't drink alcohol and drinks 2-3 cups of coffee a day. No NSAID's. Took ASA 81 mg qd till recently. Hemoglobin in Dr. Delene Ruffini office 2 days ago was 14.1, hematocrit 40.1 she has not been taking  Prilosec on regular basis but has started to do that after seeing Dr. Laurann Montana    Past Medical History  Diagnosis Date  . Hemangioma of liver   . Neuropathy, peripheral   . Diverticulosis of colon without diverticulitis   . Cystadenofibroma of ovary, unspecified laterality   . PONV (postoperative nausea and vomiting)   . Shortness of breath   . GERD (gastroesophageal reflux disease)     takes Nexium daily  . Anxiety     with death of brother none since  . Pneumonia     at age 72 years old  . H/O hiatal hernia   . Carcinoma of breast, stage 2, estrogen receptor negative, right     T2N0 right breast mastectomy/TRAM reconstruction ER negative PR positive  June 1989  Then [facility] chemo  . Skin cancer of face    "had some places frozen off" (04/13/2013)  . OSA on CPAP     setting 15- uses occ.  Marland Kitchen Hiatal hernia   . Gastric ulcer     Past Surgical History  Procedure Laterality Date  . Abdominal hysterectomy  1989  . Ankle fracture surgery Right ?1996  . Wrist fracture surgery Right ~ 2007    "put a pin in it" (04/13/2013)  . Cataract extraction w/phaco Right 10/18/2012    Procedure: CATARACT EXTRACTION PHACO AND INTRAOCULAR LENS PLACEMENT (IOC);  Surgeon: Elta Guadeloupe T. Gershon Crane, MD;  Location: [facility];  Service: Ophthalmology;  Laterality: Right;  CDE:7.67  . Cataract extraction w/phaco Left 11/01/2012    Procedure: CATARACT EXTRACTION PHACO AND INTRAOCULAR LENS PLACEMENT (IOC);  Surgeon: Elta Guadeloupe T. Gershon Crane, MD;  Location: [facility];  Service: Ophthalmology;  Laterality: Left;  CDE:9.92  . Breast capsulectomy with implant exchange Right 5/49/8264    "silicon gel implant" (1/58/3094)  . Cholecystectomy  1990's  . Breast reconstruction Right   . Breast lumpectomy Right 1963; 1981; 1989    "benign; benign; malignant"  . Breast biopsy Right 1963; 1981; 1989  . Mastectomy complete / simple w/ sentinel node biopsy Right 1989  . Breast reconstruction with placement of tissue expander and flex hd (acellular hydrated dermis) Right 1989  . Reconstruction / correction of nipple / aerola Right 1989  . Breast implant removal Right 04/13/2013    Procedure: REMOVAL RIGHT RUPTURED  BREAST IMPLANTS, DELAYED BREAST RECONSTRUCTION WITH SILICONE GEL IMPLANTS;  Surgeon: Crissie Reese, MD;  Location: [facility];  Service: Plastics;  Laterality: Right;  . Capsulectomy Right 04/13/2013    Procedure: CAPSULECTOMY;  Surgeon: Crissie Reese, MD;  Location: [place];  Service: Plastics;  Laterality: Right;  . Esophagogastroduodenoscopy N/A 10/13/2013    Procedure: ESOPHAGOGASTRODUODENOSCOPY (EGD);  Surgeon: Jerene Bears, MD;  Location: [facility];  Service: Gastroenterology;  Laterality: N/A;    No Known Allergies  Family history and social history  have been reviewed.  Review of Systems: Epigastric pain. Negative for nausea or vomiting. Positive for bright red blood on toilet tissue tissue and also in the stool  The remainder of the 10 point ROS is negative except as outlined in the H&P  Physical Exam: General Appearance Well developed, in no distress Eyes  Non icteric  HEENT  Non traumatic, normocephalic  Mouth No lesion, tongue papillated, no cheilosis Neck Supple without adenopathy, thyroid not enlarged, no carotid bruits, no JVD Lungs Clear to auscultation bilaterally COR Normal S1, normal S2, regular rhythm, no murmur, quiet precordium Abdomen protuberant soft tender in epigastrium. No rebound. No pulsations. Lower abdomen unremarkable. Liver edge at costal margin Rectal and anoscopic exam reveals normal perianal area. Normal rectal sphincter tone. Only Hemoccult-positive stool with maroon tinge. No melanotic. There were no internal hemorrhoids and no evidence of active bleeding from anal rectal area. Extremities  No pedal edema Skin No lesions Neurological Alert and oriented x 3 Psychological Normal mood and affect  Assessment and Plan:   74 year old white female with the rectal bleeding and  with epigastric pain ,  history of bleeding gastric antral  ulcer attributed to Fosamax year and half ago. She is passing bright red blood which is consistent with lower GI bleed but at the same time is having epigastric pain resembling  gastric ulcer. Her hemoglobin is normal. She has severe diverticulosis.  Doubt diverticular bleed. Anoscopic exam today reveals no source of anal rectal bleeding .Last colonoscopy in 2008. We will proceed with both upper endoscopy and colonoscopy to evaluate the pain as well as the rectal bleeding. In the meantime she will increase Prilosec to 40 mg twice a day and start Carafate 1 g twice a day. We discussed sedation procedures and prep for colonoscopy. Dr. Hilarie Fredrickson will do the procedures. If she develops  melenic stools she was asked to go to emergency room    Delfin Edis 03/13/2015

## 2015-03-13 NOTE — Patient Instructions (Signed)
You have been scheduled for an endoscopy and colonoscopy. Please follow the written instructions given to you at your visit today. Please pick up your prep supplies at the pharmacy within the next 1-3 days. If you use inhalers (even only as needed), please bring them with you on the day of your procedure. Your physician has requested that you go to www.startemmi.com and enter the access code given to you at your visit today. This web site gives a general overview about your procedure. However, you should still follow specific instructions given to you by our office regarding your preparation for the procedure.  We have sent your prescriptions to your pharmacy

## 2015-03-13 NOTE — Addendum Note (Signed)
Addended by: Oda Kilts on: 03/13/2015 10:59 AM   Modules accepted: Orders

## 2015-03-14 ENCOUNTER — Ambulatory Visit (AMBULATORY_SURGERY_CENTER): Payer: Medicare Other | Admitting: Internal Medicine

## 2015-03-14 ENCOUNTER — Encounter: Payer: Self-pay | Admitting: Internal Medicine

## 2015-03-14 VITALS — BP 154/71 | HR 59 | Temp 98.2°F | Resp 21 | Ht 63.0 in | Wt 169.0 lb

## 2015-03-14 DIAGNOSIS — R1013 Epigastric pain: Secondary | ICD-10-CM

## 2015-03-14 DIAGNOSIS — K253 Acute gastric ulcer without hemorrhage or perforation: Secondary | ICD-10-CM | POA: Diagnosis not present

## 2015-03-14 DIAGNOSIS — K259 Gastric ulcer, unspecified as acute or chronic, without hemorrhage or perforation: Secondary | ICD-10-CM | POA: Diagnosis not present

## 2015-03-14 DIAGNOSIS — Z8719 Personal history of other diseases of the digestive system: Secondary | ICD-10-CM

## 2015-03-14 DIAGNOSIS — D123 Benign neoplasm of transverse colon: Secondary | ICD-10-CM

## 2015-03-14 DIAGNOSIS — Z8711 Personal history of peptic ulcer disease: Secondary | ICD-10-CM

## 2015-03-14 DIAGNOSIS — K2950 Unspecified chronic gastritis without bleeding: Secondary | ICD-10-CM | POA: Diagnosis not present

## 2015-03-14 DIAGNOSIS — K625 Hemorrhage of anus and rectum: Secondary | ICD-10-CM

## 2015-03-14 DIAGNOSIS — D120 Benign neoplasm of cecum: Secondary | ICD-10-CM

## 2015-03-14 DIAGNOSIS — D125 Benign neoplasm of sigmoid colon: Secondary | ICD-10-CM

## 2015-03-14 MED ORDER — SUCRALFATE 1 G PO TABS: 1.0000 g | ORAL_TABLET | Freq: Four times a day (QID) | ORAL | Status: DC

## 2015-03-14 MED ORDER — SODIUM CHLORIDE 0.9 % IV SOLN: 500.0000 mL | INTRAVENOUS | Status: DC

## 2015-03-14 NOTE — Progress Notes (Signed)
Pt feeling better, passing gas more, sx's relieved

## 2015-03-14 NOTE — Patient Instructions (Signed)
YOU HAD AN ENDOSCOPIC PROCEDURE TODAY AT THE [facility]:   Refer to the procedure report that was given to you for any specific questions about what was found during the examination.  If the procedure report does not answer your questions, please call your gastroenterologist to clarify.  If you requested that your care partner not be given the details of your procedure findings, then the procedure report has been included in a sealed envelope for you to review at your convenience later.  YOU SHOULD EXPECT: Some feelings of bloating in the abdomen. Passage of more gas than usual.  Walking can help get rid of the air that was put into your GI tract during the procedure and reduce the bloating. If you had a lower endoscopy (such as a colonoscopy or flexible sigmoidoscopy) you may notice spotting of blood in your stool or on the toilet paper. If you underwent a bowel prep for your procedure, you may not have a normal bowel movement for a few days.  Please Note:  You might notice some irritation and congestion in your nose or some drainage.  This is from the oxygen used during your procedure.  There is no need for concern and it should clear up in a day or so.  SYMPTOMS TO REPORT IMMEDIATELY:   Following lower endoscopy (colonoscopy or flexible sigmoidoscopy):  Excessive amounts of blood in the stool  Significant tenderness or worsening of abdominal pains  Swelling of the abdomen that is new, acute  Fever of 100F or higher   Following upper endoscopy (EGD)  Vomiting of blood or coffee ground material  New chest pain or pain under the shoulder blades  Painful or persistently difficult swallowing  New shortness of breath  Fever of 100F or higher  Black, tarry-looking stools  For urgent or emergent issues, a gastroenterologist can be reached at any hour by calling [phone].   DIET: Your first meal following the procedure should be a small meal and then it is ok to progress to  your normal diet. Heavy or fried foods are harder to digest and may make you feel nauseous or bloated.  Likewise, meals heavy in dairy and vegetables can increase bloating.  Drink plenty of fluids but you should avoid alcoholic beverages for 24 hours.  ACTIVITY:  You should plan to take it easy for the rest of today and you should NOT DRIVE or use heavy machinery until tomorrow (because of the sedation medicines used during the test).    FOLLOW UP: Our staff will call the number listed on your records the next business day following your procedure to check on you and address any questions or concerns that you may have regarding the information given to you following your procedure. If we do not reach you, we will leave a message.  However, if you are feeling well and you are not experiencing any problems, there is no need to return our call.  We will assume that you have returned to your regular daily activities without incident.  If any biopsies were taken you will be contacted by phone or by letter within the next 1-3 weeks.  Please call us at [phone] if you have not heard about the biopsies in 3 weeks.    SIGNATURES/CONFIDENTIALITY: You and/or your care partner have signed paperwork which will be entered into your electronic medical record.  These signatures attest to the fact that that the information above on your After Visit Summary has been reviewed   and is understood.  Full responsibility of the confidentiality of this discharge information lies with you and/or your care-partner.  Await pathology report Avoid NSAIDS- Tylenol, Alleve, Advil, Naprosyn Continue Omeprazole 40 mg twice daily, increase Carafate to 1 g before meals at bedtime Follow up in 4-6 weeks with Nurse Practioner

## 2015-03-14 NOTE — Op Note (Signed)
[facility] [place], [address]   ENDOSCOPY PROCEDURE REPORT  PATIENT: Molly [facility], Molly Black  MR#: [id number] BIRTHDATE: 30-Aug-1940 , 73  yrs. old GENDER: female ENDOSCOPIST: Jerene Bears, MD REFERRED BY:  Lafayette Dragon, M.D. PROCEDURE DATE:  03/14/2015 PROCEDURE:  EGD, diagnostic ASA CLASS:     Class III INDICATIONS:  epigastric pain and rectal bleeding and history of gastric ulcer in early 2015. MEDICATIONS: Monitored anesthesia care and Propofol 200 mg IV TOPICAL ANESTHETIC: none  DESCRIPTION OF PROCEDURE: After the risks benefits and alternatives of the procedure were thoroughly explained, informed consent was obtained.  The LB [id number] endoscope was introduced through the mouth and advanced to the second portion of the duodenum , Without limitations.  The instrument was slowly withdrawn as the mucosa was fully examined.   ESOPHAGUS: There was LA Class A reflux esophagitis noted at the GE junction.   A partial and nonobstructing Schatzki ring was found 35 cm from the incisors and at the gastroesophageal junction.  STOMACH: A 3 cm hiatal hernia was noted.   A single non-bleeding, clean-based, round and deep ulcer measuring 8 x 58m in size was found in the gastric antrum.  Biopsies were taken around the ulcer and at edge of the ulcer.   Gastritis (inflammation) was found in the gastric antrum.  Cold forcep biopsies were taken at the gastric body, antrum and angularis to evaluate for h.  pylori.  DUODENUM: The duodenal mucosa showed no abnormalities in the bulb and 2nd part of the duodenum.  Retroflexed views revealed a hiatal hernia.     The scope was then withdrawn from the patient and the procedure completed.  COMPLICATIONS: There were no immediate complications.  ENDOSCOPIC IMPRESSION: 1.   There was LA Class A reflux esophagitis noted 2.   Partial and nonobstructing Schatzki ring was found 35 cm from the incisors and at the  gastroesophageal junction 3.   3 cm hiatal hernia 4.   Single ulcer measuring 8 x 135min size was found in the gastric antrum; biopsies were taken 5.   Gastritis (inflammation) was found in the gastric antrum; H. pylori biopsies performed 6.   The duodenal mucosa showed no abnormalities in the bulb and 2nd part of the duodenum       RECOMMENDATIONS: 1.  Await biopsy results 2.  Follow-up of helicobacter pylori status, treat if indicated 3.  Avoid NSAIDs 4.  Continue omeprazole 40 mg twice daily before 1st and last meal of the day.  Increase Carafate to 1 g before meals and at bedtime 5.  Office follow-up 4-6 weeks with me of advanced practitioner  eSigned:  JaJerene Black 03/14/2015 8:58 AM    CCSR:PRXYrOlevia PerchesMD, JoLavone OrnMD, and The Patient  PATIENT NAME:  Molly Black, RabadiR#: [id number]

## 2015-03-14 NOTE — Progress Notes (Addendum)
Pt c/o right lower quad abdominal cramping. Given x2 Levsin sublingual, repositioned bed for comfort and flatus.

## 2015-03-14 NOTE — Progress Notes (Signed)
Transferred to recovery room. A/O x3, pleased with MAC.  VSS.  Report to Jill, RN. 

## 2015-03-14 NOTE — Progress Notes (Signed)
Called to room to assist during endoscopic procedure.  Patient ID and intended procedure confirmed with present staff. Received instructions for my participation in the procedure from the performing physician.  

## 2015-03-14 NOTE — Op Note (Addendum)
[facility]  [address]. [place], [address]   COLONOSCOPY PROCEDURE REPORT  PATIENT: Molly Black, Molly Black  MR#: [id number] BIRTHDATE: 1941-01-18 , 73  yrs. old GENDER: female ENDOSCOPIST: Jerene Bears, MD REFERRED [id number] Andris Baumann, M.D. PROCEDURE DATE:  03/14/2015 PROCEDURE:   Colonoscopy, diagnostic and Colonoscopy with snare polypectomy First Screening Colonoscopy - Avg.  risk and is 50 yrs.  old or older - No.  Prior Negative Screening - Now for repeat screening. N/A  History of Adenoma - Now for follow-up colonoscopy & has been > or = to 3 yrs.  N/A  Polyps removed today? Yes ASA CLASS:   Class III INDICATIONS:epigastric abdominal pain and rectal bleeding. MEDICATIONS: Monitored anesthesia care, this was the total dose used for all procedures at this session, and Propofol 500 mg IV  DESCRIPTION OF PROCEDURE:   After the risks benefits and alternatives of the procedure were thoroughly explained, informed consent was obtained.  The digital rectal exam revealed no rectal mass.   The LB [id number]  endoscope was introduced through the anus and advanced to the cecum, which was identified by both the appendix and ileocecal valve. No adverse events experienced. The quality of the prep was fair.  (Suprep was used)  The instrument was then slowly withdrawn as the colon was fully examined. Estimated blood loss is zero unless otherwise noted in this procedure report.   COLON FINDINGS: A sessile polyp measuring 10 mm in size with a mucous cap was found at the cecum.  A polypectomy was performed with a cold snare.  The resection was complete, the polyp tissue was completely retrieved and sent to histology.   A sessile polyp measuring 3 mm in size was found in the transverse colon.  A polypectomy was performed with a cold snare.  The resection was complete but the polyp tissue was not retrieved.   A sessile polyp measuring 5 mm in size was found in the sigmoid  colon.  A polypectomy was performed with a cold snare.  The resection was complete, the polyp tissue was completely retrieved and sent to histology.   There was severe diverticulosis noted in the descending colon and sigmoid colon with associated muscular hypertrophy.  Retroflexed views revealed hypertrophied anal papilla. The time to cecum = 6.5 Withdrawal time = 22.3   The scope was withdrawn and the procedure completed.  COMPLICATIONS: There were no immediate complications.   ENDOSCOPIC IMPRESSION: 1.   Sessile polyp was found at the cecum; polypectomy was performed with a cold snare 2.   Sessile polyp was found in the transverse colon; polypectomy was performed with a cold snare 3.   Sessile polyp was found in the sigmoid colon; polypectomy was performed with a cold snare 4.   There was severe diverticulosis noted in the descending colon and sigmoid colon  RECOMMENDATIONS: 1.  Await pathology results 2.  Monitor for any recurrent rectal bleeding.  No source for rectal bleeding found, cannot exclude minor diverticular bleeding 3.  Timing of repeat colonoscopy will be determined by pathology findings. 4.  You will receive a letter within 1-2 weeks with the results of your biopsy as well as final recommendations.  Please call my office if you have not received a letter after 3 weeks.  eSigned:  Jerene Bears, MD 03/14/2015 9:35 AM Revised: 03/14/2015 9:35 AM  cc: Delfin Edis, MD, [name], MD, and The Patient   PATIENT NAME:  [facility], Molly Black MR#: [id number]

## 2015-03-15 ENCOUNTER — Telehealth: Payer: Self-pay

## 2015-03-15 NOTE — Telephone Encounter (Signed)
Unable to leave message number busy times 3.

## 2015-03-19 ENCOUNTER — Encounter: Payer: Self-pay | Admitting: Internal Medicine

## 2015-03-22 ENCOUNTER — Telehealth: Payer: Self-pay | Admitting: Internal Medicine

## 2015-03-22 NOTE — Telephone Encounter (Signed)
Spoke with pt and reviewed results letter with pt.

## 2015-04-08 ENCOUNTER — Encounter: Payer: Self-pay | Admitting: *Deleted

## 2015-05-13 ENCOUNTER — Other Ambulatory Visit (INDEPENDENT_AMBULATORY_CARE_PROVIDER_SITE_OTHER): Payer: Medicare Other

## 2015-05-13 ENCOUNTER — Ambulatory Visit (INDEPENDENT_AMBULATORY_CARE_PROVIDER_SITE_OTHER): Payer: Medicare Other | Admitting: Internal Medicine

## 2015-05-13 ENCOUNTER — Encounter: Payer: Self-pay | Admitting: Internal Medicine

## 2015-05-13 VITALS — BP 118/82 | HR 76 | Ht 61.75 in | Wt 172.1 lb

## 2015-05-13 DIAGNOSIS — D62 Acute posthemorrhagic anemia: Secondary | ICD-10-CM

## 2015-05-13 DIAGNOSIS — K625 Hemorrhage of anus and rectum: Secondary | ICD-10-CM

## 2015-05-13 DIAGNOSIS — K5731 Diverticulosis of large intestine without perforation or abscess with bleeding: Secondary | ICD-10-CM

## 2015-05-13 DIAGNOSIS — K5730 Diverticulosis of large intestine without perforation or abscess without bleeding: Secondary | ICD-10-CM

## 2015-05-13 DIAGNOSIS — Z86010 Personal history of colonic polyps: Secondary | ICD-10-CM

## 2015-05-13 DIAGNOSIS — K259 Gastric ulcer, unspecified as acute or chronic, without hemorrhage or perforation: Secondary | ICD-10-CM | POA: Diagnosis not present

## 2015-05-13 LAB — CBC WITH DIFFERENTIAL/PLATELET
BASOS ABS: 0 10*3/uL (ref 0.0–0.1)
Basophils Relative: 0.4 % (ref 0.0–3.0)
EOS ABS: 0.2 10*3/uL (ref 0.0–0.7)
Eosinophils Relative: 2.2 % (ref 0.0–5.0)
HEMATOCRIT: 41.6 % (ref 36.0–46.0)
HEMOGLOBIN: 14.2 g/dL (ref 12.0–15.0)
LYMPHS PCT: 24.3 % (ref 12.0–46.0)
Lymphs Abs: 1.7 10*3/uL (ref 0.7–4.0)
MCHC: 34.2 g/dL (ref 30.0–36.0)
MCV: 90.5 fl (ref 78.0–100.0)
MONO ABS: 0.5 10*3/uL (ref 0.1–1.0)
Monocytes Relative: 7.5 % (ref 3.0–12.0)
Neutro Abs: 4.6 10*3/uL (ref 1.4–7.7)
Neutrophils Relative %: 65.6 % (ref 43.0–77.0)
Platelets: 258 10*3/uL (ref 150.0–400.0)
RBC: 4.59 Mil/uL (ref 3.87–5.11)
RDW: 13.3 % (ref 11.5–15.5)
WBC: 7 10*3/uL (ref 4.0–10.5)

## 2015-05-13 LAB — FERRITIN: Ferritin: 39.4 ng/mL (ref 10.0–291.0)

## 2015-05-13 LAB — IBC PANEL
IRON: 119 ug/dL (ref 42–145)
SATURATION RATIOS: 35.7 % (ref 20.0–50.0)
TRANSFERRIN: 238 mg/dL (ref 212.0–360.0)

## 2015-05-13 NOTE — Progress Notes (Signed)
Subjective:    Patient ID: Molly Black, female    DOB: 08/24/1940, 74 y.o.   MRN: [id number]  HPI Molly Black is a 74 year old female followed for years by Dr. Olevia Perches who is seen in follow-up. She has a history of peptic ulcer disease, GERD, adenomatous colon polyps breast cancer in remission, sleep apnea.  She was seen by Dr. Olevia Perches on 03/13/2015 at which time she was having rectal bleeding and epigastric pain. Upper endoscopy and colonoscopy were arranged by Dr. Olevia Perches and performed by me on 03/14/2015. EGD revealed mild reflux esophagitis, partial and nonobstructing Schatzki's ring, 3 senna meter hiatal hernia, single nonbleeding gastric ulcer which was biopsied. Gastritis. A normal examined duodenum. Biopsy showed benign ulcerative gastritis. No metaplasia, dysplasia, malignancy or H. pylori. The other gastritis was biopsied and was mild chronic inactive gastritis without H. pylori. Colonoscopy performed on the same day revealed a 10 mm cecal polyp removed and found to be a sessile serrated polyp without dysplasia. 2 other polyps were removed from the transverse and sigmoid colon with cold snare. There was severe left-sided diverticulosis. Hypertrophied anal papilla were seen. The remaining pathology results showed tubular adenoma 1, sessile serrated polyp 1.   she returns stating that overall she is feeling much better. She has been taking omeprazole 40 mg twice a day and Carafate 4 times daily. Her epigastric abdominal pain has resolved. 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 [place] [facility] is sending out a nurse to check on her at home and monitor weights. She feels like her sleep apnea is weight related and so she is trying to intentionally lose weight. She's lost about 5 pounds. Appetite has returned to normal without nausea or vomiting. She denies dysphagia or odynophagia. Bowel movements a been regular. She started VSL No. 3 on advice from  her pharmacist. No further rectal bleeding. No trouble with diarrhea or constipation   Review of Systems  as per history of present illness, otherwise negative  Current Medications, Allergies, Past Medical History, Past Surgical History, Family History and Social History were reviewed in [facility] record.     Objective:   Physical Exam BP 118/82 mmHg  Pulse 76  Ht 5' 1.75" (1.568 m)  Wt 172 lb 2 oz (78.075 kg)  BMI 31.76 kg/m2 Constitutional: Well-developed and well-nourished. No distress. HEENT: Normocephalic and atraumatic. Oropharynx is clear and moist. No oropharyngeal exudate. Conjunctivae are normal.  No scleral icterus. Neck: Neck supple. Trachea midline. Cardiovascular: Normal rate, regular rhythm and intact distal pulses. No M/R/G Pulmonary/chest: Effort normal and breath sounds normal. No wheezing, rales or rhonchi. Abdominal: Soft, nontender, nondistended. Bowel sounds active throughout. There are no masses palpable. No hepatosplenomegaly. Extremities: no clubbing, cyanosis, or edema Lymphadenopathy: No cervical adenopathy noted. Neurological: Alert and oriented to person place and time. Skin: Skin is warm and dry. No rashes noted. Psychiatric: Normal mood and affect. Behavior is normal.  Hgb had returned to normal as of mid-August when checked by primary care    Assessment & Plan:   74 year old female followed for years by Dr. Olevia Perches who is seen in follow-up.  1. Gastric ulcer -- H. pylori negative. Improved by symptoms. Biopsies negative for dysplasia or malignancy. We'll have her complete current Carafate prescription and will then discontinue this medicine. I would like her to continue twice a day PPI for now given her history of ulcer disease and gastritis.   2. Rectal bleeding  --  this has not recurred. Likely internal hemorrhoids though I cannot exclude minor diverticular hemorrhage. She is asked to notify me should she have recurrent  rectal bleeding. She voices understanding   3. Anemia  -- had improved. Repeat CBC and iron studies today   4. Colon polyps -- history of repeat colonoscopy recommended in August 2019  6 month follow-up, sooner if necessary  25 minutes spent with patient today

## 2015-05-13 NOTE — Patient Instructions (Addendum)
Complete your current carafate prescription. You do not need to get refills on this.  Continue twice daily omeprazole.  Continue VSL.  Please follow up with Dr Hilarie Fredrickson in 6 months.  Your physician has requested that you go to the basement for the following lab work before leaving today: CBC, [place]

## 2015-10-14 ENCOUNTER — Other Ambulatory Visit: Payer: Self-pay

## 2015-10-14 DIAGNOSIS — Z1231 Encounter for screening mammogram for malignant neoplasm of breast: Secondary | ICD-10-CM

## 2015-12-05 ENCOUNTER — Ambulatory Visit
Admission: RE | Admit: 2015-12-05 | Discharge: 2015-12-05 | Disposition: A | Discharge status: Home / Self Care | Payer: Medicare Other | Source: Ambulatory Visit

## 2015-12-05 DIAGNOSIS — Z1231 Encounter for screening mammogram for malignant neoplasm of breast: Secondary | ICD-10-CM

## 2015-12-30 ENCOUNTER — Ambulatory Visit: Payer: Medicare Other | Admitting: Internal Medicine

## 2016-02-24 ENCOUNTER — Ambulatory Visit (INDEPENDENT_AMBULATORY_CARE_PROVIDER_SITE_OTHER): Payer: Medicare Other | Admitting: Internal Medicine

## 2016-02-24 ENCOUNTER — Encounter: Payer: Self-pay | Admitting: Internal Medicine

## 2016-02-24 VITALS — BP 126/84 | HR 84 | Ht 61.75 in | Wt 167.1 lb

## 2016-02-24 DIAGNOSIS — Z8719 Personal history of other diseases of the digestive system: Secondary | ICD-10-CM

## 2016-02-24 DIAGNOSIS — K219 Gastro-esophageal reflux disease without esophagitis: Secondary | ICD-10-CM

## 2016-02-24 DIAGNOSIS — R1013 Epigastric pain: Secondary | ICD-10-CM

## 2016-02-24 DIAGNOSIS — R197 Diarrhea, unspecified: Secondary | ICD-10-CM

## 2016-02-24 DIAGNOSIS — Z8711 Personal history of peptic ulcer disease: Secondary | ICD-10-CM

## 2016-02-24 MED ORDER — CEPHALEXIN 500 MG PO CAPS: 500.0000 mg | ORAL_CAPSULE | Freq: Three times a day (TID) | ORAL | 0 refills | Status: DC

## 2016-02-24 MED ORDER — METRONIDAZOLE 500 MG PO TABS: 500.0000 mg | ORAL_TABLET | Freq: Three times a day (TID) | ORAL | 0 refills | Status: DC

## 2016-02-24 MED ORDER — SUCRALFATE 1 G PO TABS: 1.0000 g | ORAL_TABLET | Freq: Three times a day (TID) | ORAL | 5 refills | Status: DC

## 2016-02-24 MED ORDER — OMEPRAZOLE 40 MG PO CPDR: 40.0000 mg | DELAYED_RELEASE_CAPSULE | Freq: Two times a day (BID) | ORAL | 1 refills | Status: AC

## 2016-02-24 NOTE — Patient Instructions (Addendum)
Your physician has requested that you have specialized "[facility]" lab work completed. Please take the order form given to you at your visit today and the kit sent to your home to [facility] located at [address], [place], [place] [address]. Their  hours are 7 a.m.-5 p.m, Monday thru Friday. We will contact you once results have been made available to [place]. If you have not heard from our office within 2 weeks after having lab work completed, please contact us at [phone].  We have sent the following medications to your pharmacy for you to pick up at your convenience: Omeprazole 40 mg twice daily before breakfast and before dinner. carafate 1 gram with meals  Metronidazole 500 mg three times daily x 7 days (only AFTER stool studies completed) Cephalexin 500 mg three times daily x 7 days (only AFTER stool studies completed)  Please follow up with Dr Hilarie Fredrickson in 6 months.  If you are age 1 or older, your body mass index should be between 23-30. Your Body mass index is 30.82 kg/m. If this is out of the aforementioned range listed, please consider follow up with your Primary Care Provider.  If you are age 45 or younger, your body mass index should be between 19-25. Your Body mass index is 30.82 kg/m. If this is out of the aformentioned range listed, please consider follow up with your Primary Care Provider.

## 2016-02-24 NOTE — Progress Notes (Signed)
Subjective:    Patient ID: [name], female    DOB: Dec 25, 1940, 75 y.o.   MRN: [id number]  HPI Molly Black is a 75 year old female with a history of peptic ulcer disease GERD, adenomatous colon polyps, diverticulosis, breast cancer dating back to 1989 currently in remission, who is here for follow-up. She was last seen on 05/13/2015. That was after upper endoscopy and colonoscopy were performed by me on 03/14/2015. At that time she had mild reflux esophagitis, a partial and nonobstructing Schatzki's ring and a 3 cm hiatal hernia. There was a nonbleeding gastric ulcer which was biopsied along with gastritis. Biopsies were negative for H. pylori. She was treated with twice a day PPI and Carafate. At time of follow-up she was much improved.  Most recently she reports that she has had intermittent issues with epigastric burning discomfort similar to her symptoms last year. She has some mild nausea but no vomiting. She is using omeprazole 40 mg in the morning before breakfast and then again before bed. She is no longer using Carafate. She has intermittent issues with loose stools and diarrhea. This can be urgent and are triggered by eating. She is using Lactaid milk but has urgent loose stools if she drinks a milkshake. This seems to be more prominent with the loose stools over the last 3-4 months. She denies rectal bleeding or melena. Reports appetite is good and reports weight is stable. By our scales she is down 5 pounds compared to 9 months ago. At times she feels abdominal bloating which she reports has been an issue on and off "for years". She describes her upper abdominal issues as "discomfort not real pain".  She report prior cholecystectomy.  Review of Systems As per history of present illness, otherwise negative  Current Medications, Allergies, Past Medical History, Past Surgical History, Family History and Social History were reviewed in [facility] record.    Objective:   Physical Exam BP 126/84 (BP Location: Left Arm, Patient Position: Sitting, Cuff Size: Normal)   Pulse 84   Ht 5' 1.75" (1.568 m)   Wt 167 lb 2 oz (75.8 kg)   BMI 30.82 kg/m  Constitutional: Well-developed and well-nourished. No distress. HEENT: Normocephalic and atraumatic. Oropharynx is clear and moist. No oropharyngeal exudate. Conjunctivae are normal.  No scleral icterus. Neck: Neck supple. Trachea midline. Cardiovascular: Normal rate, regular rhythm and intact distal pulses. No M/R/G Pulmonary/chest: Effort normal and breath sounds normal. No wheezing, rales or rhonchi. Abdominal: Soft, Mild tenderness in epigastrium without rebound or guarding, obese, nondistended. Bowel sounds active throughout.  Extremities: no clubbing, cyanosis, or edema Neurological: Alert and oriented to person place and time. Skin: Skin is warm and dry.  Psychiatric: Normal mood and affect. Behavior is normal.  CBC    Component Value Date/Time   WBC 7.0 05/13/2015 1147   RBC 4.59 05/13/2015 1147   HGB 14.2 05/13/2015 1147   HGB 13.8 11/25/2011 1428   HCT 41.6 05/13/2015 1147   HCT 40.2 11/25/2011 1428   PLT 258.0 05/13/2015 1147   PLT 240 11/25/2011 1428   MCV 90.5 05/13/2015 1147   MCV 90.6 11/25/2011 1428   MCH 31.6 04/05/2013 1154   MCHC 34.2 05/13/2015 1147   RDW 13.3 05/13/2015 1147   RDW 13.1 11/25/2011 1428   LYMPHSABS 1.7 05/13/2015 1147   LYMPHSABS 1.8 11/25/2011 1428   MONOABS 0.5 05/13/2015 1147   MONOABS 0.4 11/25/2011 1428   EOSABS 0.2 05/13/2015 1147   EOSABS 0.1 11/25/2011  1428   BASOSABS 0.0 05/13/2015 1147   BASOSABS 0.1 11/25/2011 1428    Assessment & Plan:  75 year old female with a history of peptic ulcer disease GERD, adenomatous colon polyps, diverticulosis, breast cancer dating back to 1989 currently in remission, who is here for follow-up.  1. Epigastric discomfort, hx of gastric ulcer -- She's having some mild dyspeptic symptoms despite her  omeprazole twice a day. I've instructed her to change the second dose to before dinner rather than before bedtime. Add Carafate back 1 g with meals. If not improving with this change in therapy, I recommend repeat upper endoscopy. I've asked that she continue to avoid NSAIDs.  2. Loose stools -- seem most consistent with irritable bowel, exacerbated by probable lactose intolerance. She may have a component of bacterial overgrowth given the gas and bloating. I have recommended stool testing and further testing for other mimickers of IBS with IBcause.  This will be sent to her home.  Once this is submitted I will empirically treat with metronidazole 500 mg 3 times a day and cephalexin 500 mg 3 times a day 7 days for bacterial overgrowth.  3-4 month follow-up, sooner if necessary 25 minutes spent with the patient today. Greater than 50% was spent in counseling and coordination of care with the patient

## 2016-03-02 ENCOUNTER — Telehealth: Payer: Self-pay | Admitting: Internal Medicine

## 2016-03-02 ENCOUNTER — Telehealth: Payer: Self-pay | Admitting: *Deleted

## 2016-03-02 DIAGNOSIS — R197 Diarrhea, unspecified: Secondary | ICD-10-CM

## 2016-03-02 NOTE — Telephone Encounter (Signed)
Pt wanted to know if she should have the labs done prior to taking the antibiotic. Discussed with pt that she should have the labs done first and then take the med.

## 2016-03-02 NOTE — Telephone Encounter (Signed)
Patient is still having  "stomach issues." Would prefer to go ahead and do testing now. Labs ordered and patient advised.

## 2016-03-02 NOTE — Telephone Encounter (Signed)
Left message for pt to call back  °

## 2016-03-02 NOTE — Telephone Encounter (Signed)
-----   Message from Jerene Bears, MD sent at 03/01/2016  3:18 PM EDT ----- Lets wait to hear back from [facility] to get an answer for the future Otherwise we can split the test apart and order individual labs Stool Cx C diff Elastase fecal Fecal calprotectin Ova and parasite Celiac panel Thanks  ----- Message ----- From: Larina Bras, CMA Sent: 02/28/2016   4:34 PM To: Jerene Bears, MD  So, patient went to [facility] labs yesterday to get the blood drawn for her IBCause test. Unfortunately, the tech told her that they no longer have a contract with [facility] so they will not draw the blood. Our lab isnt familiar with the test. We contacted Raquel Sarna with [facility] but have yet to hear back from her? Are there any tests that need to be completed STAT for her? Not sure what we do from here with any [facility] tests since [facility] now refuses to draw!  ----- Message ----- From: Larina Bras, CMA Sent: 02/28/2016 To: Larina Bras, CMA    ----- Message ----- From: Larina Bras, CMA Sent: 02/27/2016   2:06 PM To: Larina Bras, CMA

## 2016-03-03 ENCOUNTER — Other Ambulatory Visit (INDEPENDENT_AMBULATORY_CARE_PROVIDER_SITE_OTHER): Payer: Medicare Other

## 2016-03-03 DIAGNOSIS — R197 Diarrhea, unspecified: Secondary | ICD-10-CM | POA: Diagnosis not present

## 2016-03-03 LAB — IGA: IgA: 91 mg/dL (ref 68–378)

## 2016-03-04 LAB — TISSUE TRANSGLUTAMINASE, IGA: TISSUE TRANSGLUTAMINASE AB, IGA: 1 U/mL (ref <4)

## 2016-03-05 ENCOUNTER — Other Ambulatory Visit: Payer: Medicare Other

## 2016-03-05 DIAGNOSIS — R197 Diarrhea, unspecified: Secondary | ICD-10-CM

## 2016-03-06 LAB — CLOSTRIDIUM DIFFICILE BY PCR: CDIFFPCR: NOT DETECTED

## 2016-03-06 LAB — OVA AND PARASITE EXAMINATION: OP: NONE SEEN

## 2016-03-14 LAB — STOOL CULTURE

## 2016-04-22 ENCOUNTER — Ambulatory Visit (HOSPITAL_COMMUNITY): Payer: Medicare Other

## 2016-04-23 ENCOUNTER — Other Ambulatory Visit (HOSPITAL_COMMUNITY): Payer: Self-pay | Admitting: Nurse Practitioner

## 2016-04-23 ENCOUNTER — Other Ambulatory Visit: Payer: Medicare Other

## 2016-04-23 ENCOUNTER — Other Ambulatory Visit: Payer: Self-pay | Admitting: Nurse Practitioner

## 2016-04-23 ENCOUNTER — Ambulatory Visit (HOSPITAL_COMMUNITY)
Admission: RE | Admit: 2016-04-23 | Discharge: 2016-04-23 | Disposition: A | Discharge status: Home / Self Care | Payer: Medicare Other | Source: Ambulatory Visit | Attending: Nurse Practitioner | Admitting: Nurse Practitioner

## 2016-04-23 DIAGNOSIS — R938 Abnormal findings on diagnostic imaging of other specified body structures: Secondary | ICD-10-CM | POA: Insufficient documentation

## 2016-04-23 DIAGNOSIS — C779 Secondary and unspecified malignant neoplasm of lymph node, unspecified: Secondary | ICD-10-CM | POA: Insufficient documentation

## 2016-04-23 DIAGNOSIS — R590 Localized enlarged lymph nodes: Secondary | ICD-10-CM | POA: Insufficient documentation

## 2016-04-23 MED ORDER — IOPAMIDOL (ISOVUE-300) INJECTION 61%
75.0000 mL | Freq: Once | INTRAVENOUS | Status: AC | PRN
  Administered 2016-04-23: 75 mL via INTRAVENOUS

## 2016-04-24 ENCOUNTER — Other Ambulatory Visit (HOSPITAL_COMMUNITY): Payer: Self-pay | Admitting: Internal Medicine

## 2016-04-24 DIAGNOSIS — R9389 Abnormal findings on diagnostic imaging of other specified body structures: Secondary | ICD-10-CM

## 2016-04-29 ENCOUNTER — Other Ambulatory Visit (HOSPITAL_COMMUNITY): Payer: Self-pay | Admitting: Internal Medicine

## 2016-04-29 ENCOUNTER — Other Ambulatory Visit: Payer: Self-pay | Admitting: Radiology

## 2016-04-29 ENCOUNTER — Ambulatory Visit (HOSPITAL_COMMUNITY): Admission: RE | Admit: 2016-04-29 | Payer: Medicare Other | Source: Ambulatory Visit

## 2016-04-29 DIAGNOSIS — R9389 Abnormal findings on diagnostic imaging of other specified body structures: Secondary | ICD-10-CM

## 2016-04-30 ENCOUNTER — Ambulatory Visit (HOSPITAL_COMMUNITY)
Admission: RE | Admit: 2016-04-30 | Discharge: 2016-04-30 | Disposition: A | Discharge status: Home / Self Care | Payer: Medicare Other | Source: Ambulatory Visit | Attending: Internal Medicine | Admitting: Internal Medicine

## 2016-04-30 ENCOUNTER — Other Ambulatory Visit (HOSPITAL_COMMUNITY): Payer: Self-pay | Admitting: Internal Medicine

## 2016-04-30 ENCOUNTER — Encounter (HOSPITAL_COMMUNITY): Payer: Self-pay

## 2016-04-30 DIAGNOSIS — Z823 Family history of stroke: Secondary | ICD-10-CM | POA: Insufficient documentation

## 2016-04-30 DIAGNOSIS — Z7982 Long term (current) use of aspirin: Secondary | ICD-10-CM | POA: Diagnosis not present

## 2016-04-30 DIAGNOSIS — R9389 Abnormal findings on diagnostic imaging of other specified body structures: Secondary | ICD-10-CM

## 2016-04-30 DIAGNOSIS — G4733 Obstructive sleep apnea (adult) (pediatric): Secondary | ICD-10-CM | POA: Diagnosis not present

## 2016-04-30 DIAGNOSIS — Z8249 Family history of ischemic heart disease and other diseases of the circulatory system: Secondary | ICD-10-CM | POA: Diagnosis not present

## 2016-04-30 DIAGNOSIS — C770 Secondary and unspecified malignant neoplasm of lymph nodes of head, face and neck: Secondary | ICD-10-CM | POA: Diagnosis not present

## 2016-04-30 DIAGNOSIS — Z85828 Personal history of other malignant neoplasm of skin: Secondary | ICD-10-CM | POA: Insufficient documentation

## 2016-04-30 DIAGNOSIS — K219 Gastro-esophageal reflux disease without esophagitis: Secondary | ICD-10-CM | POA: Insufficient documentation

## 2016-04-30 DIAGNOSIS — Z808 Family history of malignant neoplasm of other organs or systems: Secondary | ICD-10-CM | POA: Insufficient documentation

## 2016-04-30 DIAGNOSIS — Z79899 Other long term (current) drug therapy: Secondary | ICD-10-CM | POA: Insufficient documentation

## 2016-04-30 DIAGNOSIS — Z87891 Personal history of nicotine dependence: Secondary | ICD-10-CM | POA: Insufficient documentation

## 2016-04-30 DIAGNOSIS — Z8719 Personal history of other diseases of the digestive system: Secondary | ICD-10-CM | POA: Insufficient documentation

## 2016-04-30 DIAGNOSIS — Z9221 Personal history of antineoplastic chemotherapy: Secondary | ICD-10-CM | POA: Diagnosis not present

## 2016-04-30 DIAGNOSIS — Z853 Personal history of malignant neoplasm of breast: Secondary | ICD-10-CM | POA: Insufficient documentation

## 2016-04-30 DIAGNOSIS — R591 Generalized enlarged lymph nodes: Secondary | ICD-10-CM | POA: Diagnosis present

## 2016-04-30 DIAGNOSIS — G629 Polyneuropathy, unspecified: Secondary | ICD-10-CM | POA: Diagnosis not present

## 2016-04-30 MED ORDER — LIDOCAINE HCL 1 % IJ SOLN
INTRAMUSCULAR | Status: AC
  Filled 2016-04-30: qty 20

## 2016-04-30 NOTE — CV Procedure (Signed)
Interventional Radiology Procedure Note  Procedure: US guided bx right supraclavicular mass.  Complications: None  Estimated Blood Loss: None  Recommendations: - Bedrest x 1 hr then DC home  Signed,  Criselda Peaches, MD

## 2016-04-30 NOTE — Progress Notes (Signed)
Attempted to contact pt regarding 1300 appt this afternoon. No answer

## 2016-04-30 NOTE — H&P (Signed)
Chief Complaint: Patient was seen in consultation today for right supraclavicular lymph node biopsy at the request of [place]  Referring Physician(s): Seth Ward  Supervising Physician: Jacqulynn Cadet  Patient Status: Outpatient  History of Present Illness: Molly Black is a 75 y.o. 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 Malignant superior mediastinal lymphadenopathy. Extracapsular extension. The appearance strongly favors metastatic nodal disease. No primary tumor is identified but the entire chest is not evaluated. The largest right supraclavicular fossa nodal conglomeration should be amenable to ultrasound-guided needle biopsy.  Scheduled now for R supraclavicular lymph node biopsy   Past Medical History:  Diagnosis Date  . Anxiety    with death of brother none since  . Carcinoma of breast, stage 2, estrogen receptor negative, right ([place])    T2N0 right breast mastectomy/TRAM reconstruction ER negative PR positive  June 1989  Then [facility] chemo  . Cystadenofibroma of ovary, unspecified laterality   . Diverticulosis of colon without diverticulitis   . Gastric ulcer   . GERD (gastroesophageal reflux disease)    takes Nexium daily  . H/O hiatal hernia   . Hemangioma of liver   . Hiatal hernia   . Neuropathy, peripheral ([place])   . OSA on CPAP    setting 15- uses occ.  . Pneumonia    at age 40 years old  . PONV (postoperative nausea and vomiting)   . Schatzki's ring   . Shortness of breath   . Skin cancer of face    "had some places frozen off" (04/13/2013)  . Tubular adenoma of colon     Past Surgical History:  Procedure Laterality Date  . ABDOMINAL HYSTERECTOMY  1989  . ANKLE FRACTURE SURGERY Right ?1996  . BREAST BIOPSY Right 1963; 1981; 1989   . BREAST CAPSULECTOMY WITH IMPLANT EXCHANGE Right 0/25/8527   "silicon gel implant" (7/82/4235)  . BREAST IMPLANT REMOVAL Right 04/13/2013   Procedure: REMOVAL RIGHT RUPTURED BREAST IMPLANTS, DELAYED BREAST RECONSTRUCTION WITH SILICONE GEL IMPLANTS;  Surgeon: Crissie Reese, MD;  Location: [facility];  Service: Plastics;  Laterality: Right;  . BREAST LUMPECTOMY Right 1963; 1981; 1989   "benign; benign; malignant"  . BREAST RECONSTRUCTION Right   . BREAST RECONSTRUCTION WITH PLACEMENT OF TISSUE EXPANDER AND FLEX HD (ACELLULAR HYDRATED DERMIS) Right 1989  . CAPSULECTOMY Right 04/13/2013   Procedure: CAPSULECTOMY;  Surgeon: Crissie Reese, MD;  Location: [facility];  Service: Plastics;  Laterality: Right;  . CATARACT EXTRACTION W/PHACO Right 10/18/2012   Procedure: CATARACT EXTRACTION PHACO AND INTRAOCULAR LENS PLACEMENT (IOC);  Surgeon: Elta Guadeloupe T. Gershon Crane, MD;  Location: [facility];  Service: Ophthalmology;  Laterality: Right;  CDE:7.67  . CATARACT EXTRACTION W/PHACO Left 11/01/2012   Procedure: CATARACT EXTRACTION PHACO AND INTRAOCULAR LENS PLACEMENT (IOC);  Surgeon: Elta Guadeloupe T. Gershon Crane, MD;  Location: [facility];  Service: Ophthalmology;  Laterality: Left;  CDE:9.92  . CHOLECYSTECTOMY  1990's  . ESOPHAGOGASTRODUODENOSCOPY N/A 10/13/2013   Procedure: ESOPHAGOGASTRODUODENOSCOPY (EGD);  Surgeon: Jerene Bears, MD;  Location: [facility];  Service: Gastroenterology;  Laterality: N/A;  . MASTECTOMY COMPLETE / SIMPLE W/ SENTINEL NODE BIOPSY Right 1989  . RECONSTRUCTION / CORRECTION OF NIPPLE / AEROLA Right 1989  . WRIST FRACTURE SURGERY Right ~ 2007   "put a pin in it" (04/13/2013)    Allergies: Review  of patient's allergies indicates no known allergies.  Medications: Prior to Admission medications   Medication Sig Start Date End Date Taking? Authorizing Provider  aspirin 81 MG tablet Take 81 mg by mouth daily.    Historical Provider, MD  beta carotene w/minerals (OCUVITE) tablet Take 1 tablet by mouth daily.    Historical  Provider, MD  Calcium Carbonate-Vitamin D (CALCIUM 600+D PO) Take by mouth 2 (two) times daily.    Historical Provider, MD  cephALEXin (KEFLEX) 500 MG capsule Take 1 capsule (500 mg total) by mouth 3 (three) times daily. 02/24/16   Jerene Bears, MD  Cholecalciferol (VITAMIN D3) 3000 UNITS TABS Take 1 tablet by mouth daily.    Historical Provider, MD  Cyanocobalamin 2500 MCG TABS Take 1 tablet by mouth daily.    Historical Provider, MD  metroNIDAZOLE (FLAGYL) 500 MG tablet Take 1 tablet (500 mg total) by mouth 3 (three) times daily. 02/24/16   Jerene Bears, MD  Multiple Vitamin (MULTIVITAMIN WITH MINERALS) TABS tablet Take 1 tablet by mouth daily.    Historical Provider, MD  Omega-3 Fatty Acids (FISH OIL) 1000 MG CAPS Take 2,000 mg by mouth daily.    Historical Provider, MD  omeprazole (PRILOSEC) 40 MG capsule Take 1 capsule (40 mg total) by mouth 2 (two) times daily. 30 minutes before breakfast and 30 minutes before dinner 02/24/16   Jerene Bears, MD  Probiotic Product (SUPER PROBIOTIC PO) Take 1 tablet by mouth daily.    Historical Provider, MD  sucralfate (CARAFATE) 1 g tablet Take 1 tablet (1 g total) by mouth 3 (three) times daily with meals. 02/24/16   Jerene Bears, MD     Family History  Problem Relation Age of Onset  . Melanoma Brother   . Stroke Mother   . Heart attack Father     Social History   Social History  . Marital status: Single    Spouse name: N/A  . Number of children: N/A  . Years of education: N/A   Occupational History  . Retired Retired   Social History Main Topics  . Smoking status: Former Smoker    Packs/day: 0.50    Years: 18.00    Types: Cigarettes    Quit date: 07/27/1980  . Smokeless tobacco: Never Used  . Alcohol use No  . Drug use: No  . Sexual activity: Not Currently    Birth control/ protection: Surgical   Other Topics Concern  . None   Social History Narrative  . None     Review of Systems: A 12 point ROS discussed and pertinent positives  are indicated in the HPI above.  All other systems are negative.  Review of Systems  Constitutional: Negative for activity change, fatigue and fever.  HENT: Negative for sore throat and trouble swallowing.   Respiratory: Negative for cough and shortness of breath.   Gastrointestinal: Negative for nausea.  Musculoskeletal: Negative for neck pain and neck stiffness.  Neurological: Negative for weakness.  Psychiatric/Behavioral: Negative for behavioral problems and confusion.    Vital Signs: BP (!) 157/89   Pulse 74   Temp 97.8 F (36.6 C)   Resp 16   Ht '5\' 1"'$  (1.549 m)   Wt 167 lb (75.8 kg)   SpO2 99%   BMI 31.55 kg/m   Physical Exam  Constitutional: She is oriented to person, place, and time.  Neck: Neck supple.  Rt neck swelling visible NT lymphadenopathy palpable  Cardiovascular: Normal rate, regular rhythm and normal  heart sounds.   Pulmonary/Chest: Effort normal and breath sounds normal.  Abdominal: Soft. Bowel sounds are normal.  Musculoskeletal: Normal range of motion.  Neurological: She is alert and oriented to person, place, and time.  Skin: Skin is warm and dry.  Psychiatric: She has a normal mood and affect. Her behavior is normal. Judgment and thought content normal.  Nursing note and vitals reviewed.   Mallampati Score:  MD Evaluation Airway: WNL Heart: WNL Abdomen: WNL Chest/ Lungs: WNL ASA  Classification: 2 Mallampati/Airway Score: Two  Imaging: Ct Soft Tissue Neck W Contrast  Addendum Date: 04/23/2016   ADDENDUM REPORT: 04/23/2016 15:15 ADDENDUM: Study discussed by telephone with Nurse Practitioner Dianna Rossetti on 04/23/2016 At 1500 hours. Electronically Signed   By: Genevie Ann M.D.   On: 04/23/2016 15:15   Result Date: 04/23/2016 CLINICAL DATA:  75 year old female with right side neck swelling for 2 months. Possible lymphadenopathy. Initial encounter. Personal history of remote right side breast cancer. EXAM: CT NECK WITH CONTRAST TECHNIQUE:  Multidetector CT imaging of the neck was performed using the standard protocol following the bolus administration of intravenous contrast. CONTRAST:  33m ISOVUE-300 IOPAMIDOL (ISOVUE-300) INJECTION 61% COMPARISON:  Chest radiographs 04/05/2013 FINDINGS: Pharynx and larynx: The larynx appears symmetric and within normal limits. Negative pharynx. Negative parapharyngeal and retropharyngeal spaces. Salivary glands: Extensive dental streak artifact. Negative visible sublingual space. Oral tongue largely obscured. Negative submandibular glands and parotid glands. Thyroid: Negative. Lymph nodes: Heterogeneous malignant lymphadenopathy at the right thoracic inlet and affecting the right level 4 and supraclavicular fossa nodal stations. Extracapsular extension. The largest irregular nodal conglomeration encompasses 27 x 40 x 22 mm (AP by transverse by CC). See series 2, image 65. Nearby malignant individual nodes measure up to 15 mm individually. There is associated mediastinal lymphadenopathy described below. No left thoracic inlet involvement. No level 1 through level 3 nodal station involvement. No level 5 involvement. Vascular: Major vascular structures in the upper chest, neck, and at the skullbase are patent. The proximal great vessels are moderately to severely tortuous. The visible subclavian veins, innominate veins, and SVC are patent. Tortuous cervical carotid artery. Limited intracranial: Questionable abnormal enhancement in the left temporal lobe on series 2, image 1. Visualized orbits: Negative. Mastoids and visualized paranasal sinuses: Widespread paranasal sinus opacification with mucosal thickening, mucous retention cysts common bubbly opacity. The sphenoid sinuses are relatively spared. The tympanic cavities and mastoids are clear. Skeleton: Mandible motion artifact. Degenerative changes in the cervical spine. No acute or suspicious osseous lesion identified in the neck a. Upper chest: Negative visualized  lung parenchyma. Visible trachea is patent. There is fairly bulky superior mediastinal lymphadenopathy in both the AP window and right peritracheal nodal stations. The largest visible nodal conglomeration is 3 cm. Abnormal individual nodes measure up to 23 mm. There are small increased bilateral axillary lymph nodes, measuring up to 8 mm bilaterally. No overtly malignant axillary nodes. Other: None. IMPRESSION: Malignant right lower neck and thoracic inlet lymphadenopathy. Malignant superior mediastinal lymphadenopathy. Extracapsular extension. The appearance strongly favors metastatic nodal disease. No primary tumor is identified but the entire chest is not evaluated. The largest right supraclavicular fossa nodal conglomeration should be amenable to ultrasound-guided needle biopsy. Electronically Signed: By: HGenevie AnnM.D. On: 04/23/2016 14:53    Labs:  CBC:  Recent Labs  05/13/15 1147  WBC 7.0  HGB 14.2  HCT 41.6  PLT 258.0    COAGS: No results for input(s): INR, APTT in the last 8760 hours.  BMP: No  results for input(s): NA, K, CL, CO2, GLUCOSE, BUN, CALCIUM, CREATININE, GFRNONAA, GFRAA in the last 8760 hours.  Invalid input(s): CMP  LIVER FUNCTION TESTS: No results for input(s): BILITOT, AST, ALT, ALKPHOS, PROT, ALBUMIN in the last 8760 hours.  TUMOR MARKERS: No results for input(s): AFPTM, CEA, CA199, CHROMGRNA in the last 8760 hours.  Assessment and Plan:  Right neck swelling + LAN per CT scan Hx Breast Ca Now scheduled for R Supraclavicular lymph node biopsy Risks and Benefits discussed with the patient including, but not limited to bleeding, infection, damage to adjacent structures or low yield requiring additional tests. All of the patient's questions were answered, patient is agreeable to proceed. Consent signed and in chart.   Thank you for this interesting consult.  I greatly enjoyed [place] and look forward to participating in their care.  A copy of  this report was sent to the requesting provider on this date.  Electronically Signed: Coltin Casher A 04/30/2016, 1:41 PM   I spent a total of  30 Minutes   in face to face in clinical consultation, greater than 50% of which was counseling/coordinating care for right SCLN bx

## 2016-05-02 ENCOUNTER — Ambulatory Visit (HOSPITAL_COMMUNITY): Payer: Medicare Other

## 2016-05-05 ENCOUNTER — Other Ambulatory Visit: Payer: Self-pay | Admitting: Internal Medicine

## 2016-05-05 DIAGNOSIS — C799 Secondary malignant neoplasm of unspecified site: Secondary | ICD-10-CM

## 2016-05-06 ENCOUNTER — Telehealth: Payer: Self-pay | Admitting: *Deleted

## 2016-05-06 ENCOUNTER — Other Ambulatory Visit: Payer: Medicare Other

## 2016-05-06 DIAGNOSIS — R221 Localized swelling, mass and lump, neck: Secondary | ICD-10-CM

## 2016-05-06 NOTE — Telephone Encounter (Signed)
Oncology Nurse Navigator Documentation  Oncology Nurse Navigator Flowsheets 05/06/2016  Navigator Encounter Type Telephone;Introductory phone call/I received referral on Ms. [place] today.  I called and spoke with her.  I gave her an appt to be see at [facility] on 05/14/16.  She verbalized understanding of appt time and place.   Telephone Outgoing Call  Treatment Phase Pre-Tx/Tx Discussion  Barriers/Navigation Needs Coordination of Care  Interventions Coordination of Care  Coordination of Care Appts  Acuity Level 2  Acuity Level 2 Assistance expediting appointments  Time Spent with Patient 30

## 2016-05-07 ENCOUNTER — Ambulatory Visit
Admission: RE | Admit: 2016-05-07 | Discharge: 2016-05-07 | Disposition: A | Discharge status: Home / Self Care | Payer: Medicare Other | Source: Ambulatory Visit | Attending: Internal Medicine | Admitting: Internal Medicine

## 2016-05-07 ENCOUNTER — Encounter: Payer: Self-pay | Admitting: *Deleted

## 2016-05-07 DIAGNOSIS — C799 Secondary malignant neoplasm of unspecified site: Secondary | ICD-10-CM

## 2016-05-07 MED ORDER — IOPAMIDOL (ISOVUE-300) INJECTION 61%
100.0000 mL | Freq: Once | INTRAVENOUS | Status: AC | PRN
  Administered 2016-05-07: 100 mL via INTRAVENOUS

## 2016-05-07 NOTE — Progress Notes (Signed)
Oncology Nurse Navigator Documentation  Oncology Nurse Navigator Flowsheets 05/07/2016  Navigator Encounter Type Other/per cancer conference, Molly Black's tissue will [facility]. I requested [facility] to send.  I was updated that tissue will be sent out on 05/16/16 due to billing.   Treatment Phase Pre-Tx/Tx Discussion  Barriers/Navigation Needs Coordination of Care  Interventions Coordination of Care  Coordination of Care Other  Acuity Level 2  Acuity Level 2 Other  Time Spent with Patient 30

## 2016-05-08 ENCOUNTER — Telehealth: Payer: Self-pay | Admitting: *Deleted

## 2016-05-08 NOTE — Telephone Encounter (Signed)
Mailed [facility] letter to pt.  

## 2016-05-14 ENCOUNTER — Ambulatory Visit: Payer: Medicare Other | Attending: Internal Medicine | Admitting: Physical Therapy

## 2016-05-14 ENCOUNTER — Other Ambulatory Visit (HOSPITAL_BASED_OUTPATIENT_CLINIC_OR_DEPARTMENT_OTHER): Payer: Medicare Other

## 2016-05-14 ENCOUNTER — Ambulatory Visit (HOSPITAL_BASED_OUTPATIENT_CLINIC_OR_DEPARTMENT_OTHER): Payer: Medicare Other | Admitting: Internal Medicine

## 2016-05-14 ENCOUNTER — Ambulatory Visit
Admission: RE | Admit: 2016-05-14 | Discharge: 2016-05-14 | Disposition: A | Discharge status: Home / Self Care | Payer: Medicare Other | Source: Ambulatory Visit | Attending: Radiation Oncology | Admitting: Radiation Oncology

## 2016-05-14 ENCOUNTER — Encounter: Payer: Self-pay | Admitting: Internal Medicine

## 2016-05-14 ENCOUNTER — Encounter: Payer: Self-pay | Admitting: *Deleted

## 2016-05-14 ENCOUNTER — Telehealth: Payer: Self-pay | Admitting: Internal Medicine

## 2016-05-14 DIAGNOSIS — K769 Liver disease, unspecified: Secondary | ICD-10-CM

## 2016-05-14 DIAGNOSIS — R2681 Unsteadiness on feet: Secondary | ICD-10-CM | POA: Diagnosis present

## 2016-05-14 DIAGNOSIS — C3492 Malignant neoplasm of unspecified part of left bronchus or lung: Secondary | ICD-10-CM

## 2016-05-14 DIAGNOSIS — C3431 Malignant neoplasm of lower lobe, right bronchus or lung: Secondary | ICD-10-CM

## 2016-05-14 DIAGNOSIS — C3491 Malignant neoplasm of unspecified part of right bronchus or lung: Secondary | ICD-10-CM

## 2016-05-14 DIAGNOSIS — Z853 Personal history of malignant neoplasm of breast: Secondary | ICD-10-CM

## 2016-05-14 DIAGNOSIS — R221 Localized swelling, mass and lump, neck: Secondary | ICD-10-CM

## 2016-05-14 DIAGNOSIS — C770 Secondary and unspecified malignant neoplasm of lymph nodes of head, face and neck: Secondary | ICD-10-CM | POA: Diagnosis not present

## 2016-05-14 HISTORY — DX: Malignant neoplasm of unspecified part of right bronchus or lung: C34.91

## 2016-05-14 LAB — CBC WITH DIFFERENTIAL/PLATELET
BASO%: 0.3 % (ref 0.0–2.0)
BASOS ABS: 0 10*3/uL (ref 0.0–0.1)
EOS ABS: 0.2 10*3/uL (ref 0.0–0.5)
EOS%: 2.3 % (ref 0.0–7.0)
HCT: 39.5 % (ref 34.8–46.6)
HGB: 14 g/dL (ref 11.6–15.9)
LYMPH%: 22.5 % (ref 14.0–49.7)
MCH: 31.1 pg (ref 25.1–34.0)
MCHC: 35.4 g/dL (ref 31.5–36.0)
MCV: 87.8 fL (ref 79.5–101.0)
MONO#: 0.5 10*3/uL (ref 0.1–0.9)
MONO%: 7.6 % (ref 0.0–14.0)
NEUT%: 67.3 % (ref 38.4–76.8)
NEUTROS ABS: 4.6 10*3/uL (ref 1.5–6.5)
Platelets: 254 10*3/uL (ref 145–400)
RBC: 4.5 10*6/uL (ref 3.70–5.45)
RDW: 13.2 % (ref 11.2–14.5)
WBC: 6.9 10*3/uL (ref 3.9–10.3)
lymph#: 1.5 10*3/uL (ref 0.9–3.3)

## 2016-05-14 LAB — COMPREHENSIVE METABOLIC PANEL
ALT: 22 U/L (ref 0–55)
ANION GAP: 10 meq/L (ref 3–11)
AST: 24 U/L (ref 5–34)
Albumin: 3.5 g/dL (ref 3.5–5.0)
Alkaline Phosphatase: 43 U/L (ref 40–150)
BUN: 14.4 mg/dL (ref 7.0–26.0)
CHLORIDE: 107 meq/L (ref 98–109)
CO2: 23 meq/L (ref 22–29)
Calcium: 9.9 mg/dL (ref 8.4–10.4)
Creatinine: 0.8 mg/dL (ref 0.6–1.1)
EGFR: 75 mL/min/{1.73_m2} — AB (ref >90)
GLUCOSE: 122 mg/dL (ref 70–140)
POTASSIUM: 4 meq/L (ref 3.5–5.1)
SODIUM: 140 meq/L (ref 136–145)
Total Bilirubin: 1.03 mg/dL (ref 0.20–1.20)
Total Protein: 7.1 g/dL (ref 6.4–8.3)

## 2016-05-14 MED ORDER — PROCHLORPERAZINE MALEATE 10 MG PO TABS: 10.0000 mg | ORAL_TABLET | Freq: Four times a day (QID) | ORAL | 0 refills | Status: DC | PRN

## 2016-05-14 NOTE — Progress Notes (Signed)
START ON PATHWAY REGIMEN - Non-Small Cell Lung  QGB201: Carboplatin AUC=2 + Paclitaxel 45 mg/m2 Weekly During Radiation   Administer weekly:     Paclitaxel (Taxol(R)) 45 mg/m2 in 250 mL NS IV over 1 hour followed by Dose Mod: None     Carboplatin (Paraplatin(R)) AUC=2 in 100 mL NS IV over 30 minutes Dose Mod: None Additional Orders: * All AUC calculations intended to be used in [employer] formula  **Always confirm dose/schedule in your pharmacy ordering system**    Patient Characteristics: Stage III - Unresectable, PS = 0, 1 AJCC M Stage: 0 AJCC N Stage: 3 AJCC T Stage: 2a Current Disease Status: No Distant Metastases or Local Recurrence AJCC Stage Grouping: IIIB Performance Status: PS = 0, 1  Intent of Therapy: Non-Curative / Palliative Intent, Discussed with Patient

## 2016-05-14 NOTE — Progress Notes (Signed)
[facility] Clinical Social Work  Clinical Social Work met with patient/family and Futures trader at [facility] appointment to offer support and assess for psychosocial needs.  Medical oncologist reviewed patient's diagnosis and recommended treatment plan with patient/family.  Ms. Stough was accompanied by his sister in law/best friend and two nieces.  The patient is not married, lives alone, is retired but works as a Oceanographer.  Ms. Wipperfurth participates in water aerobics at the [facility] regularly.  The patient and family discussed feeling sad and fearful of cancer diagnosis.  Patient reports she is "ready to get this started".  CSW provided brief emotional support.  Clinical Social Work briefly discussed Clinical Social Work role and [employer] support programs/services.  Clinical Social Work encouraged patient to call with any additional questions or concerns.   Polo Riley, MSW, LCSW, OSW-C Clinical Social Worker [facility] [phone]

## 2016-05-14 NOTE — Progress Notes (Signed)
Radiation Oncology         [phone] ________________________________  Multidisciplinary Thoracic Oncology Clinic [facility]) Initial Outpatient Consultation  Name: Molly Black MRN: [id number]  Date: 05/14/2016  DOB: 02-27-1941  GT:XMIWOEH,OZYY [name], MD  [name], MD   REFERRING PHYSICIAN: [name], MD  DIAGNOSIS: 75 year-old woman with adenocarcinoma of the left lower lobe, pending PET and MRI   There were no encounter diagnoses.  No diagnosis found.  HISTORY OF Molly Black is a 75 y.o. female who presented with right side neck swelling for 2 months. This was investigated with a CT Neck on 04/23/2016 which revealed malignant right lower neck and thoracic inlet lymphadenopathy, as well as malignant superior mediastinal lymphadenopathy. Extracapsular extension was noted. The appearance of these strongly favors metastatic nodal disease with no primary tumor identified. A biopsy of the right supraclavicular lymph node on 04/30/2016 showed metastatic adenocarcinoma, consistent with lung primary. CT Chest, Abdomen, Pelvis on 05/07/2016 showed left lower lobe mass measuring 3.1 x 3.3 cm consistent with bronchogenic carcinoma. Also seen were sub solid densities superior segment left lower lobe which could represent additional areas of tumor. There was malignant adenopathy in the mediastinum and right peritracheal lymph nodes. There were multiple liver lesions, many of are benign cysts. However, there is an enhancing lesion in the right lobe liver which may represent a hemangioma or could represent metastatic disease, but in discussing this with her, she has had an anomaly of the liver since the 1990s.  The patient was referred today for presentation in the multidisciplinary conference.  Radiology studies and pathology slides were presented there for review and discussion of treatment options.  A consensus was discussed regarding potential next steps.   PREVIOUS  RADIATION THERAPY: No, She has a history of breast cancer on the right side. Status post modified mastectomy and chemotherapy. She did not receive radiation treatment.  PAST MEDICAL HISTORY:  Past Medical History:  Diagnosis Date  . Anxiety    with death of brother none since  . Carcinoma of breast, stage 2, estrogen receptor negative, right ([place])    T2N0 right breast mastectomy/TRAM reconstruction ER negative PR positive  June 1989  Then [facility] chemo  . Cystadenofibroma of ovary, unspecified laterality   . Diverticulosis of colon without diverticulitis   . Gastric ulcer   . GERD (gastroesophageal reflux disease)    takes Nexium daily  . H/O hiatal hernia   . Hemangioma of liver   . Hiatal hernia   . Neuropathy, peripheral ([place])   . OSA on CPAP    setting 15- uses occ.  . Pneumonia    at age 65 years old  . PONV (postoperative nausea and vomiting)   . Schatzki's ring   . Shortness of breath   . Skin cancer of face    "had some places frozen off" (04/13/2013)  . Tubular adenoma of colon      PAST SURGICAL HISTORY: Past Surgical History:  Procedure Laterality Date  . ABDOMINAL HYSTERECTOMY  1989  . ANKLE FRACTURE SURGERY Right ?1996  . BREAST BIOPSY Right 1963; 1981; 1989  . BREAST CAPSULECTOMY WITH IMPLANT EXCHANGE Right 4/82/5003   "silicon gel implant" (01/28/8888)  . BREAST IMPLANT REMOVAL Right 04/13/2013   Procedure: REMOVAL RIGHT RUPTURED BREAST IMPLANTS, DELAYED BREAST RECONSTRUCTION WITH SILICONE GEL IMPLANTS;  Surgeon: Crissie Reese, MD;  Location: [facility];  Service: Plastics;  Laterality: Right;  . BREAST LUMPECTOMY Right 1963; 1981; 1989   "benign; benign; malignant"  .  BREAST RECONSTRUCTION Right   . BREAST RECONSTRUCTION WITH PLACEMENT OF TISSUE EXPANDER AND FLEX HD (ACELLULAR HYDRATED DERMIS) Right 1989  . CAPSULECTOMY Right 04/13/2013   Procedure: CAPSULECTOMY;  Surgeon: Crissie Reese, MD;  Location: [facility];  Service: Plastics;  Laterality: Right;  . CATARACT  EXTRACTION W/PHACO Right 10/18/2012   Procedure: CATARACT EXTRACTION PHACO AND INTRAOCULAR LENS PLACEMENT (IOC);  Surgeon: Elta Guadeloupe T. Gershon Crane, MD;  Location: [facility];  Service: Ophthalmology;  Laterality: Right;  CDE:7.67  . CATARACT EXTRACTION W/PHACO Left 11/01/2012   Procedure: CATARACT EXTRACTION PHACO AND INTRAOCULAR LENS PLACEMENT (IOC);  Surgeon: Elta Guadeloupe T. Gershon Crane, MD;  Location: [facility];  Service: Ophthalmology;  Laterality: Left;  CDE:9.92  . CHOLECYSTECTOMY  1990's  . ESOPHAGOGASTRODUODENOSCOPY N/A 10/13/2013   Procedure: ESOPHAGOGASTRODUODENOSCOPY (EGD);  Surgeon: Jerene Bears, MD;  Location: [facility];  Service: Gastroenterology;  Laterality: N/A;  . MASTECTOMY COMPLETE / SIMPLE W/ SENTINEL NODE BIOPSY Right 1989  . RECONSTRUCTION / CORRECTION OF NIPPLE / AEROLA Right 1989  . WRIST FRACTURE SURGERY Right ~ 2007   "put a pin in it" (04/13/2013)    FAMILY HISTORY:  Family History  Problem Relation Age of Onset  . Melanoma Brother   . Stroke Mother   . Heart attack Father     SOCIAL HISTORY:  reports that she quit smoking about 35 years ago. Her smoking use included Cigarettes. She has a 9.00 pack-year smoking history. She has never used smokeless tobacco. She reports that she does not drink alcohol or use drugs. The patient lives in [place]. 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  ALLERGIES: Review of patient's allergies indicates no known allergies.  MEDICATIONS:  Current Outpatient Prescriptions  Medication Sig Dispense Refill  . aspirin 81 MG tablet Take 81 mg by mouth daily.    . beta carotene w/minerals (OCUVITE) tablet Take 1 tablet by mouth daily.    . Calcium Carbonate-Vitamin D (CALCIUM 600+D PO) Take by mouth 2 (two) times daily.    . cephALEXin (KEFLEX) 500 MG capsule Take 1 capsule (500 mg total) by mouth 3 (three) times daily. 21 capsule 0  . Cholecalciferol (VITAMIN D3) 3000 UNITS TABS Take 1 tablet by mouth daily.    . Cyanocobalamin 2500 MCG TABS Take 1  tablet by mouth daily.    . metroNIDAZOLE (FLAGYL) 500 MG tablet Take 1 tablet (500 mg total) by mouth 3 (three) times daily. 21 tablet 0  . Multiple Vitamin (MULTIVITAMIN WITH MINERALS) TABS tablet Take 1 tablet by mouth daily.    . Omega-3 Fatty Acids (FISH OIL) 1000 MG CAPS Take 2,000 mg by mouth daily.    Marland Kitchen omeprazole (PRILOSEC) 40 MG capsule Take 1 capsule (40 mg total) by mouth 2 (two) times daily. 30 minutes before breakfast and 30 minutes before dinner 180 capsule 1  . Probiotic Product (SUPER PROBIOTIC PO) Take 1 tablet by mouth daily.    . sucralfate (CARAFATE) 1 g tablet Take 1 tablet (1 g total) by mouth 3 (three) times daily with meals. 90 tablet 5   No current facility-administered medications for this encounter.     REVIEW OF SYSTEMS:  On review of systems, the patient reports that she is doing well overall. She denies any chest pain, shortness of breath, cough, fevers, chills, night sweats, unintended weight changes. She denies any bowel or bladder disturbances, and denies abdominal pain, nausea or vomiting. She denies any new musculoskeletal or joint aches or pains. A complete review of systems is obtained  and is otherwise negative.   PHYSICAL EXAM:  Vitals with BMI 05/14/2016  Height 5' 1"   Weight 167 lbs 14 oz  BMI 43.3  Systolic 295  Diastolic 88  Pulse 73  Respirations 17   In general this is a well appearing Caucasian female in no acute distress. She is alert and oriented x4 and appropriate throughout the examination. HEENT reveals that the patient is normocephalic, atraumatic. EOMs are intact. PERRLA. Skin is intact without any evidence of gross lesions. Cardiovascular exam reveals a regular rate and rhythm, no clicks rubs or murmurs are auscultated. Chest is clear to auscultation bilaterally. Lymphatic assessment is performed and does not reveal any adenopathy in the cervical, supraclavicular, axillary, or inguinal chains. Abdomen has active bowel sounds in all  quadrants and is intact. The abdomen is soft, non tender, non distended. Lower extremities are negative for pretibial pitting edema, deep calf tenderness, cyanosis or clubbing.   KPS = 90  100 - Normal; no complaints; no evidence of disease. 90   - Able to carry on normal activity; minor signs or symptoms of disease. 80   - Normal activity with effort; some signs or symptoms of disease. 17   - Cares for self; unable to carry on normal activity or to do active work. 60   - Requires occasional assistance, but is able to care for most of his personal needs. 50   - Requires considerable assistance and frequent medical care. 67   - Disabled; requires special care and assistance. 21   - Severely disabled; hospital admission is indicated although death not imminent. 73   - Very sick; hospital admission necessary; active supportive treatment necessary. 10   - Moribund; fatal processes progressing rapidly. 0     - Dead  Karnofsky DA, Abelmann [place], Craver LS and Burchenal [facility] [phone]) The use of the nitrogen mustards in the palliative treatment of carcinoma: with particular reference to bronchogenic carcinoma Cancer 1 634-56  LABORATORY DATA:  Lab Results  Component Value Date   WBC 6.9 05/14/2016   HGB 14.0 05/14/2016   HCT 39.5 05/14/2016   MCV 87.8 05/14/2016   PLT 254 05/14/2016   Lab Results  Component Value Date   NA 137 04/05/2013   K 4.4 04/05/2013   CL 102 04/05/2013   CO2 26 04/05/2013   Lab Results  Component Value Date   ALT 17 11/25/2011   AST 23 11/25/2011   ALKPHOS 29 (L) 11/25/2011   BILITOT 1.0 11/25/2011    RADIOGRAPHY: Dg Chest 2 View  Result Date: 04/30/2016 CLINICAL DATA:  Right neck nodule biopsied today. Clinically reported abnormal chest radiograph. Ex-smoker. History of breast cancer and pneumonia. EXAM: CHEST  2 VIEW COMPARISON:  04/05/2013. FINDINGS: The cardiac silhouette is borderline enlarged and accentuated by a poor inspiration. The aorta is tortuous and  calcified. The interstitial markings are mildly prominent. A right breast prosthesis and right axillary surgical clips are again demonstrated. Mild thoracic spine degenerative changes and mild scoliosis. Cholecystectomy clips. Diffuse osteopenia. IMPRESSION: 1. No acute abnormality. 2. Stable mild chronic interstitial lung disease. 3. Borderline cardiomegaly. 4. Aortic atherosclerosis. Electronically Signed   By: Claudie Revering M.D.   On: 04/30/2016 15:48   Ct Soft Tissue Neck W Contrast  Addendum Date: 04/23/2016   ADDENDUM REPORT: 04/23/2016 15:15 ADDENDUM: Study discussed by telephone with Nurse Practitioner Dianna Rossetti on 04/23/2016 At 1500 hours. Electronically Signed   By: Genevie Ann M.D.   On: 04/23/2016 15:15   Result Date:  04/23/2016 CLINICAL DATA:  75 year old female with right side neck swelling for 2 months. Possible lymphadenopathy. Initial encounter. Personal history of remote right side breast cancer. EXAM: CT NECK WITH CONTRAST TECHNIQUE: Multidetector CT imaging of the neck was performed using the standard protocol following the bolus administration of intravenous contrast. CONTRAST:  33m ISOVUE-300 IOPAMIDOL (ISOVUE-300) INJECTION 61% COMPARISON:  Chest radiographs 04/05/2013 FINDINGS: Pharynx and larynx: The larynx appears symmetric and within normal limits. Negative pharynx. Negative parapharyngeal and retropharyngeal spaces. Salivary glands: Extensive dental streak artifact. Negative visible sublingual space. Oral tongue largely obscured. Negative submandibular glands and parotid glands. Thyroid: Negative. Lymph nodes: Heterogeneous malignant lymphadenopathy at the right thoracic inlet and affecting the right level 4 and supraclavicular fossa nodal stations. Extracapsular extension. The largest irregular nodal conglomeration encompasses 27 x 40 x 22 mm (AP by transverse by CC). See series 2, image 65. Nearby malignant individual nodes measure up to 15 mm individually. There is associated  mediastinal lymphadenopathy described below. No left thoracic inlet involvement. No level 1 through level 3 nodal station involvement. No level 5 involvement. Vascular: Major vascular structures in the upper chest, neck, and at the skullbase are patent. The proximal great vessels are moderately to severely tortuous. The visible subclavian veins, innominate veins, and SVC are patent. Tortuous cervical carotid artery. Limited intracranial: Questionable abnormal enhancement in the left temporal lobe on series 2, image 1. Visualized orbits: Negative. Mastoids and visualized paranasal sinuses: Widespread paranasal sinus opacification with mucosal thickening, mucous retention cysts common bubbly opacity. The sphenoid sinuses are relatively spared. The tympanic cavities and mastoids are clear. Skeleton: Mandible motion artifact. Degenerative changes in the cervical spine. No acute or suspicious osseous lesion identified in the neck a. Upper chest: Negative visualized lung parenchyma. Visible trachea is patent. There is fairly bulky superior mediastinal lymphadenopathy in both the AP window and right peritracheal nodal stations. The largest visible nodal conglomeration is 3 cm. Abnormal individual nodes measure up to 23 mm. There are small increased bilateral axillary lymph nodes, measuring up to 8 mm bilaterally. No overtly malignant axillary nodes. Other: None. IMPRESSION: Malignant right lower neck and thoracic inlet lymphadenopathy. Malignant superior mediastinal lymphadenopathy. Extracapsular extension. The appearance strongly favors metastatic nodal disease. No primary tumor is identified but the entire chest is not evaluated. The largest right supraclavicular fossa nodal conglomeration should be amenable to ultrasound-guided needle biopsy. Electronically Signed: By: HGenevie AnnM.D. On: 04/23/2016 14:53   Ct Chest W Contrast  Result Date: 05/07/2016 CLINICAL DATA:  Metastatic cancer. Malignant adenopathy right neck.  History of breast cancer. EXAM: CT CHEST AND ABDOMEN WITH CONTRAST TECHNIQUE: Multidetector CT imaging of the chest and abdomen was performed following the standard protocol during bolus administration of intravenous contrast. CONTRAST:  1022mISOVUE-300 IOPAMIDOL (ISOVUE-300) INJECTION 61% COMPARISON:  CT neck 04/23/2016 FINDINGS: CT CHEST FINDINGS Cardiovascular: Normal aorta and pulmonary arteries. Heart size within normal limits. Mild coronary calcification. No pericardial effusion. Mediastinum/Nodes: Right peritracheal lymph node 18 mm. Right lower peritracheal lymph node 23 mm. Subcarinal lymph node 13 mm. Left upper paratracheal lymph node 22 mm. Left lower peritracheal lymph node 13 mm. Lungs/Pleura: Left lower lobe mass lesion measuring 3.1 x 3.3 cm with irregular margins. Highly suspicious for bronchogenic carcinoma. Sub solid densities in the superior segment left lower lobe measuring approximately 12 mm, and 15 mm. These could represent additional areas of tumor or infection. No lung nodules on the right. Mild emphysema in the lung bases.  No pleural effusion. Musculoskeletal: Thoracic disc degeneration.  No fracture or metastatic disease in the spine or ribs. Right mastectomy with reconstruction. CT ABDOMEN FINDINGS Hepatobiliary: 22 x 34 mm cyst in the anterior liver. 28 mm cyst in the caudate lobe. Several sub cm low-density lesions liver are indeterminate but may represent cysts. 22 x 27 mm lesion in the lateral right lobe liver with peripheral enhancement. This may represent hemangioma. Metastatic disease not excluded. Gallbladder surgically absent.  No biliary dilatation. Pancreas: Negative Spleen: Negative Adrenals/Urinary Tract: No renal mass or obstruction or stone. No adrenal mass. Stomach/Bowel: Stomach and duodenum normal. Negative for bowel obstruction. No bowel mass or edema. Vascular/Lymphatic: Mild atherosclerotic disease in the aorta without aneurysm. No abdominal lymphadenopathy. Other:  No free fluid.  Small umbilical hernia. Musculoskeletal: Grade 2 anterior slip L4-5 with severe facet degeneration and severe spinal stenosis. No fracture in the lumbar spine. No evidence of metastatic disease in the spine. IMPRESSION: Left lower lobe mass measuring 3.1 x 3.3 cm consistent with bronchogenic carcinoma. Sub solid densities superior segment left lower lobe could represent additional areas of tumor. There is malignant adenopathy in the mediastinum and right peritracheal lymph nodes. Multiple liver lesions. Many of these are benign cysts. There is an enhancing lesion in the right lobe liver which may represent a hemangioma however could represent metastatic disease. Further attention at PET scan is recommended. If this does not resolve the issue, MRI of the liver without with contrast may be helpful. Whole-body PET scanning recommended for staging. Grade 2 anterior slip L4-5 with severe spinal stenosis. At the patient's request, I reviewed the images and findings with the patient and her nephew. Electronically Signed   By: Franchot Gallo M.D.   On: 05/07/2016 16:46   Ct Abdomen W Contrast  Result Date: 05/07/2016 CLINICAL DATA:  Metastatic cancer. Malignant adenopathy right neck. History of breast cancer. EXAM: CT CHEST AND ABDOMEN WITH CONTRAST TECHNIQUE: Multidetector CT imaging of the chest and abdomen was performed following the standard protocol during bolus administration of intravenous contrast. CONTRAST:  138m ISOVUE-300 IOPAMIDOL (ISOVUE-300) INJECTION 61% COMPARISON:  CT neck 04/23/2016 FINDINGS: CT CHEST FINDINGS Cardiovascular: Normal aorta and pulmonary arteries. Heart size within normal limits. Mild coronary calcification. No pericardial effusion. Mediastinum/Nodes: Right peritracheal lymph node 18 mm. Right lower peritracheal lymph node 23 mm. Subcarinal lymph node 13 mm. Left upper paratracheal lymph node 22 mm. Left lower peritracheal lymph node 13 mm. Lungs/Pleura: Left lower lobe  mass lesion measuring 3.1 x 3.3 cm with irregular margins. Highly suspicious for bronchogenic carcinoma. Sub solid densities in the superior segment left lower lobe measuring approximately 12 mm, and 15 mm. These could represent additional areas of tumor or infection. No lung nodules on the right. Mild emphysema in the lung bases.  No pleural effusion. Musculoskeletal: Thoracic disc degeneration. No fracture or metastatic disease in the spine or ribs. Right mastectomy with reconstruction. CT ABDOMEN FINDINGS Hepatobiliary: 22 x 34 mm cyst in the anterior liver. 28 mm cyst in the caudate lobe. Several sub cm low-density lesions liver are indeterminate but may represent cysts. 22 x 27 mm lesion in the lateral right lobe liver with peripheral enhancement. This may represent hemangioma. Metastatic disease not excluded. Gallbladder surgically absent.  No biliary dilatation. Pancreas: Negative Spleen: Negative Adrenals/Urinary Tract: No renal mass or obstruction or stone. No adrenal mass. Stomach/Bowel: Stomach and duodenum normal. Negative for bowel obstruction. No bowel mass or edema. Vascular/Lymphatic: Mild atherosclerotic disease in the aorta without aneurysm. No abdominal lymphadenopathy. Other: No free fluid.  Small umbilical  hernia. Musculoskeletal: Grade 2 anterior slip L4-5 with severe facet degeneration and severe spinal stenosis. No fracture in the lumbar spine. No evidence of metastatic disease in the spine. IMPRESSION: Left lower lobe mass measuring 3.1 x 3.3 cm consistent with bronchogenic carcinoma. Sub solid densities superior segment left lower lobe could represent additional areas of tumor. There is malignant adenopathy in the mediastinum and right peritracheal lymph nodes. Multiple liver lesions. Many of these are benign cysts. There is an enhancing lesion in the right lobe liver which may represent a hemangioma however could represent metastatic disease. Further attention at PET scan is recommended.  If this does not resolve the issue, MRI of the liver without with contrast may be helpful. Whole-body PET scanning recommended for staging. Grade 2 anterior slip L4-5 with severe spinal stenosis. At the patient's request, I reviewed the images and findings with the patient and her nephew. Electronically Signed   By: Franchot Gallo M.D.   On: 05/07/2016 16:46   US Biopsy  Result Date: 04/30/2016 INDICATION: 75 year old female with mediastinal and right supraclavicular adenopathy concerning for metastatic disease. EXAM: ULTRASOUND BIOPSY CORE MEDICATIONS: None. ANESTHESIA/SEDATION: None required FLUOROSCOPY TIME:  Fluoroscopy Time: 0 minutes 0 seconds (0 mGy). COMPLICATIONS: None immediate. PROCEDURE: Informed written consent was obtained from the patient after a thorough discussion of the procedural risks, benefits and alternatives. All questions were addressed. A timeout was performed prior to the initiation of the procedure. Ultrasound was used to interrogate the right supraclavicular fossa. Multiple heterogeneous and morphologically abnormal lymph nodes are visualized. A suitable site was selected for biopsy. The overlying skin was prepped in standard fashion using chlorhexidine skin prep. The area was draped. Local anesthesia was attained by infiltration with 1% lidocaine. A small dermatotomy was made. Under real-time sonographic guidance, multiple 18 gauge core biopsies were obtained using the [employer] automated biopsy device. Biopsy specimens were placed in saline and delivered to pathology for further analysis. Post biopsy ultrasound imaging demonstrates no hematoma, hemorrhage or other complication. The patient tolerated the procedure well. IMPRESSION: Technically successful ultrasound-guided core biopsy of right supraclavicular adenopathy. Electronically Signed   By: Jacqulynn Cadet M.D.   On: 04/30/2016 16:27     IMPRESSION/PLAN:  24. 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 Dr. Tammi Klippel met with the patient and family about the findings and work-up thus far. We discussed the natural history of lung cancer and general treatment, highlighting the role of radiotherapy in the management.  Dr. Tammi Klippel recommends proceeding with staging PET scan as well as MRI of the brain to rule out stage IV disease. We discussed the available radiation techniques, and focused on the details of logistics and delivery.  We reviewed the anticipated acute and late sequelae associated with radiation in this setting.   The patient would like to proceed with radiation and was scheduled for simulation on 05/18/16.   The above documentation reflects my direct findings during this shared patient visit. Please see the separate note by Dr. Tammi Klippel on this date for the remainder of the patient's plan of care.     Carola Rhine, PAC   This document serves as a record of services personally performed by Shona Simpson, PAC and Tyler Pita, MD. It was created on their behalf by Arlyce Harman, a trained medical scribe. The creation of this record is based on the scribe's personal observations and the provider's statements to them. This document has been checked and  approved by the attending provider.

## 2016-05-14 NOTE — Telephone Encounter (Signed)
GAVE PATIENT AVS REPORT AND APPOINTMENTS FOR October THRU November

## 2016-05-14 NOTE — Therapy (Signed)
[place], [place], [address] Phone: [phone]   Fax:  [phone]  Physical Therapy Evaluation  Patient Details  Name: Molly Black MRN: [id number] Date of Birth: Aug 10, 1940 Referring Provider: Dr. Curt Bears  Encounter Date: 05/14/2016      PT End of Session - 05/14/16 1629    Visit Number 1   Number of Visits 1   PT Start Time 9892   PT Stop Time 1613   PT Time Calculation (min) 23 min   Activity Tolerance Patient tolerated treatment well   Behavior During Therapy [facility] for tasks assessed/performed      Past Medical History:  Diagnosis Date  . Anxiety    with death of brother none since  . Carcinoma of breast, stage 2, estrogen receptor negative, right ([place])    T2N0 right breast mastectomy/TRAM reconstruction ER negative PR positive  June 1989  Then [facility] chemo  . Cystadenofibroma of ovary, unspecified laterality   . Diverticulosis of colon without diverticulitis   . Gastric ulcer   . GERD (gastroesophageal reflux disease)    takes Nexium daily  . H/O hiatal hernia   . Hemangioma of liver   . Hiatal hernia   . Neuropathy, peripheral ([place])   . OSA on CPAP    setting 15- uses occ.  . Pneumonia    at age 70 years old  . PONV (postoperative nausea and vomiting)   . Schatzki's ring   . Shortness of breath   . Skin cancer of face    "had some places frozen off" (04/13/2013)  . Tubular adenoma of colon     Past Surgical History:  Procedure Laterality Date  . ABDOMINAL HYSTERECTOMY  1989  . ANKLE FRACTURE SURGERY Right ?1996  . BREAST BIOPSY Right 1963; 1981; 1989  . BREAST CAPSULECTOMY WITH IMPLANT EXCHANGE Right 08/14/4172   "silicon gel implant" (0/81/4481)  . BREAST IMPLANT REMOVAL Right 04/13/2013   Procedure: REMOVAL RIGHT RUPTURED BREAST IMPLANTS, DELAYED BREAST RECONSTRUCTION WITH SILICONE GEL IMPLANTS;  Surgeon: Crissie Reese, MD;  Location: [place];  Service: Plastics;   Laterality: Right;  . BREAST LUMPECTOMY Right 1963; 1981; 1989   "benign; benign; malignant"  . BREAST RECONSTRUCTION Right   . BREAST RECONSTRUCTION WITH PLACEMENT OF TISSUE EXPANDER AND FLEX HD (ACELLULAR HYDRATED DERMIS) Right 1989  . CAPSULECTOMY Right 04/13/2013   Procedure: CAPSULECTOMY;  Surgeon: Crissie Reese, MD;  Location: [place];  Service: Plastics;  Laterality: Right;  . CATARACT EXTRACTION W/PHACO Right 10/18/2012   Procedure: CATARACT EXTRACTION PHACO AND INTRAOCULAR LENS PLACEMENT (IOC);  Surgeon: Elta Guadeloupe T. Gershon Crane, MD;  Location: [facility];  Service: Ophthalmology;  Laterality: Right;  CDE:7.67  . CATARACT EXTRACTION W/PHACO Left 11/01/2012   Procedure: CATARACT EXTRACTION PHACO AND INTRAOCULAR LENS PLACEMENT (IOC);  Surgeon: Elta Guadeloupe T. Gershon Crane, MD;  Location: [facility];  Service: Ophthalmology;  Laterality: Left;  CDE:9.92  . CHOLECYSTECTOMY  1990's  . ESOPHAGOGASTRODUODENOSCOPY N/A 10/13/2013   Procedure: ESOPHAGOGASTRODUODENOSCOPY (EGD);  Surgeon: Jerene Bears, MD;  Location: [facility];  Service: Gastroenterology;  Laterality: N/A;  . MASTECTOMY COMPLETE / SIMPLE W/ SENTINEL NODE BIOPSY Right 1989  . RECONSTRUCTION / CORRECTION OF NIPPLE / AEROLA Right 1989  . WRIST FRACTURE SURGERY Right ~ 2007   "put a pin in it" (04/13/2013)    There were no vitals filed for this visit.       Subjective Assessment - 05/14/16 1617    Subjective no complaints today; when unsteady on  her feet, blamed her shoes for being off balance   Patient is accompained by: Family member  sister-in-law, niece, two others   Pertinent History Presented with right neck swelling x 1 week; workup showed right neck and chest mass.  Diagnosed with left lower lobe 3x3.5 cm. 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  Needs further workup for staging and will have chemoradiation or possibly just chemo.  Ex-smoker who quit in 1982 (9 pack-years); stage II breast CA  in 1988 s/p mastectomy and chemo; right ankle fx with ORIF 1996 and left wrist ORIF; 2013therapy for shoulder dislocation   Patient Stated Goals get info from all lung clinic providers   Currently in Pain? No/denies            [facility] PT Assessment - 05/14/16 0001      Assessment   Medical Diagnosis left lower lobe mass, adenocarinoma   Referring Provider Dr. Curt Bears   Onset Date/Surgical Date 04/23/16     Precautions   Precautions Other (comment)   Precaution Comments cancer precautions; off balance during eval     Restrictions   Weight Bearing Restrictions No     Balance Screen   Has the patient fallen in the past 6 months No   Has the patient had a decrease in activity level because of a fear of falling?  No   Is the patient reluctant to leave their home because of a fear of falling?  No     Home Environment   Living Environment Private residence   Living Arrangements Alone   Type of [place] One level;Laundry or work area in basement     Prior Function   Level of Independence Independent   Leisure has been going to [employer] up to 3x/week     Cognition   Overall Cognitive Status Within Functional Limits for tasks assessed     Observation/Other Assessments   Observations anxious appearing today     Functional Tests   Functional tests Sit to Stand     Sit to Stand   Comments 10 times in 30 seconds, about average for age  moderate dyspnea following this     ROM / Strength   AROM / PROM / Strength AROM     AROM   Overall AROM Comments trunk AROM WFL except extension limited 25%  Patient with mild loss of balance during active trunk ROM     Ambulation/Gait   Ambulation/Gait Yes   Ambulation/Gait Assistance 7: Independent     Balance   Balance Assessed Yes     Dynamic Standing Balance   Dynamic Standing - Comments reaches 10 inches forward in standing, about average for age, but does get off balance and needed to hold on to  catch herself                           PT Education - 05/14/16 1629    Education provided Yes   Education Details energy conservation, walking or other exercise, Cure article on staying active, posture, breathing, PT info   Person(s) Educated Patient   Methods Explanation;Handout   Comprehension Verbalized understanding               [place] - 05/14/16 1632      Patient will be able to verbalize understanding of the benefit of exercise to decrease fatigue.  Status Achieved     Patient will be able to verbalize the importance of posture.   Status Achieved     Patient will be able to demonstrate diaphragmatic breathing for improved lung function.   Status Achieved     Patient will be able to verbalize understanding of the role of physical therapy to prevent functional decline and who to contact if physical therapy is needed.   Status Achieved             Plan - 05/14/16 1630    Clinical Impression Statement Patient with stage IIIB or greater lung cancer and expecting to have chemoradiation.  She shows slight limitation in active trunk ROM and moderate dyspnea with 30 second sit to/from standing.  She lost her balance about three times during standing trunk ROM, and blamed imbalance on her shoes.   Rehab Potential Good   PT Frequency One time visit   PT Treatment/Interventions Patient/family education   PT Next Visit Plan none at this time; patient may benefit from further balance testing and treatment as she lost balance several times during assessment today   PT Home Exercise Plan aerobic exercise, breathing exercise   Consulted and Agree with Plan of Care Patient      Patient will benefit from skilled therapeutic intervention in order to improve the following deficits and impairments:  Cardiopulmonary status limiting activity, Decreased balance, Impaired flexibility  Visit Diagnosis: Unsteadiness on feet - Plan: PT plan of care  cert/re-cert      G-Codes - 07/19/81 1633    Functional Assessment Tool Used clinical judgement   Functional Limitation Changing and maintaining body position   Changing and Maintaining Body Position Current Status (N0037) At least 1 percent but less than 20 percent impaired, limited or restricted   Changing and Maintaining Body Position Goal Status (C4888) At least 1 percent but less than 20 percent impaired, limited or restricted   Changing and Maintaining Body Position Discharge Status (B1694) At least 1 percent but less than 20 percent impaired, limited or restricted       Problem List Patient Active Problem List   Diagnosis Date Noted  . Adenocarcinoma of left lung, stage 3 ([place]) 05/14/2016  . Neck mass 05/06/2016  . Gastric ulcer, acute 10/13/2013  . Closed dislocation of shoulder, unspecified site 01/19/2012  . Carcinoma of breast, stage 2, estrogen receptor negative, right ([place]) 12/07/2011  . Hemangioma of liver 12/07/2011  . Neuropathy, peripheral ([place]) 12/07/2011  . Diverticulosis of colon without diverticulitis 12/07/2011  . GERD (gastroesophageal reflux disease) 12/07/2011  . Cystadenofibroma of ovary, unspecified laterality 12/07/2011    Molly Black 05/14/2016, 4:35 PM  [place], [place], [address] Phone: [phone]   Fax:  [phone]  Name: Molly Black MRN: [id number] Date of Birth: Jun 18, 1941  Serafina Royals, PT 05/14/16 4:35 PM

## 2016-05-14 NOTE — Progress Notes (Signed)
[place] [phone]   [phone] Multidisciplinary thoracic oncology clinic  CONSULT NOTE  REFERRING PHYSICIAN: Dr. [name]  REASON FOR CONSULTATION:  75 years old white female recently diagnosed with lung cancer.  HPI Molly Black is a 75 y.o. female with remote history of smoking as well as history of stage II right breast adenocarcinoma, ER negative, PR positive status post right mastectomy with TRAM reconstruction followed by systemic chemotherapy with CMF under the care of Dr. Beryle Beams in 1989. The patient has been doing fine with no concerning issues until a few weeks ago when she felt swelling in the right side of the neck. Her last mammogram in May 2017 was negative for malignancy. The patient was seen by her primary care physician and CT scan of the neck was performed on 04/23/2016 and it showed malignant right lower neck and thoracic inlet lymphadenopathy. There was also malignant superior mediastinal lymphadenopathy with extracapsular extension. On 04/26/2016 she underwent ultrasound-guided core biopsy of the right supraclavicular lymph nodes by interventional radiology and the final pathology (Case# [id number]) was consistent with metastatic adenocarcinoma of lung primary. The malignant cells were positive for cytokeratin 7, napsin-A and TTF-1. The patient also had CT scan of the chest and abdomen on 05/07/2016 and it showed Right peritracheal lymph node 18 mm. Right lower peritracheal lymph node 23 mm. Subcarinal lymph node 13 mm. Left upper paratracheal lymph node 22 mm. Left lower peritracheal lymph node 13 mm. Lungs/Pleura: Left lower lobe mass lesion measuring 3.1 x 3.3 cm with irregular margins. Highly suspicious for bronchogenic carcinoma. Sub solid densities in the superior segment left lower lobe measuring approximately 12 mm, and 15 mm. These could represent additional areas of tumor or infection. There was also multiple liver  lesions many of these are benign cysts but there was an enhancing lesion in the right lobe of the liver which may represent a hemangioma or metastatic disease. Dr. Laurann Montana kindly referred the patient to the multidisciplinary thoracic oncology clinic today for further evaluation and recommendation regarding treatment of her condition. When seen today she has no complaints except for the swelling in the right side of the neck and mild shortness breath. She denied having any significant chest pain, cough or hemoptysis. She has no significant nausea, vomiting, diarrhea or constipation. The patient denied having any headache or visual changes. She has no significant weight loss or night sweats. Family history significant for mother died from stroke and father had congestive heart failure. She also had a brother with melanoma. The patient is single and has no children. She was accompanied by 2 nieces and sister-in-law. 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 She has a history of smoking 1 pack per day for around 10 years but quit in 1988. She has no history of alcohol or drug abuse.  HPI  Past Medical History:  Diagnosis Date  . Anxiety    with death of brother none since  . Carcinoma of breast, stage 2, estrogen receptor negative, right ([place])    T2N0 right breast mastectomy/TRAM reconstruction ER negative PR positive  June 1989  Then CMF chemo  . Cystadenofibroma of ovary, unspecified laterality   . Diverticulosis of colon without diverticulitis   . Gastric ulcer   . GERD (gastroesophageal reflux disease)    takes Nexium daily  . H/O hiatal hernia   . Hemangioma of liver   . Hiatal hernia   . Neuropathy, peripheral ([place])   .  OSA on CPAP    setting 15- uses occ.  . Pneumonia    at age 74 years old  . PONV (postoperative nausea and vomiting)   . Schatzki's ring   . Shortness of breath   . Skin cancer of face    "had some places frozen off" (04/13/2013)  . Tubular  adenoma of colon     Past Surgical History:  Procedure Laterality Date  . ABDOMINAL HYSTERECTOMY  1989  . ANKLE FRACTURE SURGERY Right ?1996  . BREAST BIOPSY Right 1963; 1981; 1989  . BREAST CAPSULECTOMY WITH IMPLANT EXCHANGE Right 01/17/7627   "silicon gel implant" (10/09/1759)  . BREAST IMPLANT REMOVAL Right 04/13/2013   Procedure: REMOVAL RIGHT RUPTURED BREAST IMPLANTS, DELAYED BREAST RECONSTRUCTION WITH SILICONE GEL IMPLANTS;  Surgeon: Crissie Reese, MD;  Location: [place] [facility];  Service: Plastics;  Laterality: Right;  . BREAST LUMPECTOMY Right 1963; 1981; 1989   "benign; benign; malignant"  . BREAST RECONSTRUCTION Right   . BREAST RECONSTRUCTION WITH PLACEMENT OF TISSUE EXPANDER AND FLEX HD (ACELLULAR HYDRATED DERMIS) Right 1989  . CAPSULECTOMY Right 04/13/2013   Procedure: CAPSULECTOMY;  Surgeon: Crissie Reese, MD;  Location: [place];  Service: Plastics;  Laterality: Right;  . CATARACT EXTRACTION W/PHACO Right 10/18/2012   Procedure: CATARACT EXTRACTION PHACO AND INTRAOCULAR LENS PLACEMENT (IOC);  Surgeon: Elta Guadeloupe T. Gershon Crane, MD;  Location: [facility];  Service: Ophthalmology;  Laterality: Right;  CDE:7.67  . CATARACT EXTRACTION W/PHACO Left 11/01/2012   Procedure: CATARACT EXTRACTION PHACO AND INTRAOCULAR LENS PLACEMENT (IOC);  Surgeon: Elta Guadeloupe T. Gershon Crane, MD;  Location: [facility];  Service: Ophthalmology;  Laterality: Left;  CDE:9.92  . CHOLECYSTECTOMY  1990's  . ESOPHAGOGASTRODUODENOSCOPY N/A 10/13/2013   Procedure: ESOPHAGOGASTRODUODENOSCOPY (EGD);  Surgeon: Jerene Bears, MD;  Location: [facility];  Service: Gastroenterology;  Laterality: N/A;  . MASTECTOMY COMPLETE / SIMPLE W/ SENTINEL NODE BIOPSY Right 1989  . RECONSTRUCTION / CORRECTION OF NIPPLE / AEROLA Right 1989  . WRIST FRACTURE SURGERY Right ~ 2007   "put a pin in it" (04/13/2013)    Family History  Problem Relation Age of Onset  . Melanoma Brother   . Stroke Mother   . Heart attack Father     Social History Social History  Substance Use  Topics  . Smoking status: Former Smoker    Packs/day: 0.50    Years: 18.00    Types: Cigarettes    Quit date: 07/27/1980  . Smokeless tobacco: Never Used  . Alcohol use No    No Known Allergies  Current Outpatient Prescriptions  Medication Sig Dispense Refill  . ALPRAZolam (XANAX) 0.5 MG tablet     . aspirin 81 MG tablet Take 81 mg by mouth daily.    . beta carotene w/minerals (OCUVITE) tablet Take 1 tablet by mouth daily.    . Calcium Carbonate-Vitamin D (CALCIUM 600+D PO) Take by mouth 2 (two) times daily.    . Cholecalciferol (VITAMIN D3) 3000 UNITS TABS Take 1 tablet by mouth daily.    . Cyanocobalamin 2500 MCG TABS Take 1 tablet by mouth daily.    . metroNIDAZOLE (FLAGYL) 500 MG tablet Take 1 tablet (500 mg total) by mouth 3 (three) times daily. 21 tablet 0  . Multiple Vitamin (MULTIVITAMIN WITH MINERALS) TABS tablet Take 1 tablet by mouth daily.    . Omega-3 Fatty Acids (FISH OIL) 1000 MG CAPS Take 2,000 mg by mouth daily.    Marland Kitchen omeprazole (PRILOSEC) 40 MG capsule Take 1 capsule (40 mg total) by mouth 2 (two)  times daily. 30 minutes before breakfast and 30 minutes before dinner 180 capsule 1  . Probiotic Product (SUPER PROBIOTIC PO) Take 1 tablet by mouth daily.    . sucralfate (CARAFATE) 1 g tablet Take 1 tablet (1 g total) by mouth 3 (three) times daily with meals. 90 tablet 5  . cephALEXin (KEFLEX) 500 MG capsule      No current facility-administered medications for this visit.     Review of Systems  Constitutional: negative Eyes: negative Ears, nose, mouth, throat, and face: negative Respiratory: positive for dyspnea on exertion Cardiovascular: negative Gastrointestinal: negative Genitourinary:negative Integument/breast: negative Hematologic/lymphatic: negative Musculoskeletal:negative Neurological: negative Behavioral/Psych: negative Endocrine: negative Allergic/Immunologic: negative  Physical Exam  OVZ:CHYIF, healthy, no distress, well nourished, well  developed and anxious SKIN: skin color, texture, turgor are normal, no rashes or significant lesions HEAD: Normocephalic, No masses, lesions, tenderness or abnormalities EYES: normal, PERRLA, Conjunctiva are pink and non-injected EARS: External ears normal, Canals clear OROPHARYNX:no exudate, no erythema and lips, buccal mucosa, and tongue normal  NECK: Palpable right supraclavicular lymph node LYMPH:  enlarged lymph nodes palpated in the right supraclavicular area BREAST:not examined LUNGS: clear to auscultation , and palpation HEART: regular rate & rhythm, no murmurs and no gallops ABDOMEN:abdomen soft, non-tender, normal bowel sounds and no masses or organomegaly BACK: Back symmetric, no curvature., No CVA tenderness EXTREMITIES:no joint deformities, effusion, or inflammation, no edema, no skin discoloration  NEURO: alert & oriented x 3 with fluent speech, no focal motor/sensory deficits  PERFORMANCE STATUS: ECOG 1  LABORATORY DATA: Lab Results  Component Value Date   WBC 6.9 05/14/2016   HGB 14.0 05/14/2016   HCT 39.5 05/14/2016   MCV 87.8 05/14/2016   PLT 254 05/14/2016      Chemistry      Component Value Date/Time   NA 140 05/14/2016 1310   K 4.0 05/14/2016 1310   CL 102 04/05/2013 1154   CO2 23 05/14/2016 1310   BUN 14.4 05/14/2016 1310   CREATININE 0.8 05/14/2016 1310      Component Value Date/Time   CALCIUM 9.9 05/14/2016 1310   ALKPHOS 43 05/14/2016 1310   AST 24 05/14/2016 1310   ALT 22 05/14/2016 1310   BILITOT 1.03 05/14/2016 1310       RADIOGRAPHIC STUDIES: Dg Chest 2 View  Result Date: 04/30/2016 CLINICAL DATA:  Right neck nodule biopsied today. Clinically reported abnormal chest radiograph. Ex-smoker. History of breast cancer and pneumonia. EXAM: CHEST  2 VIEW COMPARISON:  04/05/2013. FINDINGS: The cardiac silhouette is borderline enlarged and accentuated by a poor inspiration. The aorta is tortuous and calcified. The interstitial markings are mildly  prominent. A right breast prosthesis and right axillary surgical clips are again demonstrated. Mild thoracic spine degenerative changes and mild scoliosis. Cholecystectomy clips. Diffuse osteopenia. IMPRESSION: 1. No acute abnormality. 2. Stable mild chronic interstitial lung disease. 3. Borderline cardiomegaly. 4. Aortic atherosclerosis. Electronically Signed   By: Claudie Revering M.D.   On: 04/30/2016 15:48   Ct Soft Tissue Neck W Contrast  Addendum Date: 04/23/2016   ADDENDUM REPORT: 04/23/2016 15:15 ADDENDUM: Study discussed by telephone with Nurse Practitioner Dianna Rossetti on 04/23/2016 At 1500 hours. Electronically Signed   By: Genevie Ann M.D.   On: 04/23/2016 15:15   Result Date: 04/23/2016 CLINICAL DATA:  75 year old female with right side neck swelling for 2 months. Possible lymphadenopathy. Initial encounter. Personal history of remote right side breast cancer. EXAM: CT NECK WITH CONTRAST TECHNIQUE: Multidetector CT imaging of the neck was performed  using the standard protocol following the bolus administration of intravenous contrast. CONTRAST:  2m ISOVUE-300 IOPAMIDOL (ISOVUE-300) INJECTION 61% COMPARISON:  Chest radiographs 04/05/2013 FINDINGS: Pharynx and larynx: The larynx appears symmetric and within normal limits. Negative pharynx. Negative parapharyngeal and retropharyngeal spaces. Salivary glands: Extensive dental streak artifact. Negative visible sublingual space. Oral tongue largely obscured. Negative submandibular glands and parotid glands. Thyroid: Negative. Lymph nodes: Heterogeneous malignant lymphadenopathy at the right thoracic inlet and affecting the right level 4 and supraclavicular fossa nodal stations. Extracapsular extension. The largest irregular nodal conglomeration encompasses 27 x 40 x 22 mm (AP by transverse by CC). See series 2, image 65. Nearby malignant individual nodes measure up to 15 mm individually. There is associated mediastinal lymphadenopathy described below. No left  thoracic inlet involvement. No level 1 through level 3 nodal station involvement. No level 5 involvement. Vascular: Major vascular structures in the upper chest, neck, and at the skullbase are patent. The proximal great vessels are moderately to severely tortuous. The visible subclavian veins, innominate veins, and SVC are patent. Tortuous cervical carotid artery. Limited intracranial: Questionable abnormal enhancement in the left temporal lobe on series 2, image 1. Visualized orbits: Negative. Mastoids and visualized paranasal sinuses: Widespread paranasal sinus opacification with mucosal thickening, mucous retention cysts common bubbly opacity. The sphenoid sinuses are relatively spared. The tympanic cavities and mastoids are clear. Skeleton: Mandible motion artifact. Degenerative changes in the cervical spine. No acute or suspicious osseous lesion identified in the neck a. Upper chest: Negative visualized lung parenchyma. Visible trachea is patent. There is fairly bulky superior mediastinal lymphadenopathy in both the AP window and right peritracheal nodal stations. The largest visible nodal conglomeration is 3 cm. Abnormal individual nodes measure up to 23 mm. There are small increased bilateral axillary lymph nodes, measuring up to 8 mm bilaterally. No overtly malignant axillary nodes. Other: None. IMPRESSION: Malignant right lower neck and thoracic inlet lymphadenopathy. Malignant superior mediastinal lymphadenopathy. Extracapsular extension. The appearance strongly favors metastatic nodal disease. No primary tumor is identified but the entire chest is not evaluated. The largest right supraclavicular fossa nodal conglomeration should be amenable to ultrasound-guided needle biopsy. Electronically Signed: By: HGenevie AnnM.D. On: 04/23/2016 14:53   Ct Chest W Contrast  Result Date: 05/07/2016 CLINICAL DATA:  Metastatic cancer. Malignant adenopathy right neck. History of breast cancer. EXAM: CT CHEST AND ABDOMEN  WITH CONTRAST TECHNIQUE: Multidetector CT imaging of the chest and abdomen was performed following the standard protocol during bolus administration of intravenous contrast. CONTRAST:  1035mISOVUE-300 IOPAMIDOL (ISOVUE-300) INJECTION 61% COMPARISON:  CT neck 04/23/2016 FINDINGS: CT CHEST FINDINGS Cardiovascular: Normal aorta and pulmonary arteries. Heart size within normal limits. Mild coronary calcification. No pericardial effusion. Mediastinum/Nodes: Right peritracheal lymph node 18 mm. Right lower peritracheal lymph node 23 mm. Subcarinal lymph node 13 mm. Left upper paratracheal lymph node 22 mm. Left lower peritracheal lymph node 13 mm. Lungs/Pleura: Left lower lobe mass lesion measuring 3.1 x 3.3 cm with irregular margins. Highly suspicious for bronchogenic carcinoma. Sub solid densities in the superior segment left lower lobe measuring approximately 12 mm, and 15 mm. These could represent additional areas of tumor or infection. No lung nodules on the right. Mild emphysema in the lung bases.  No pleural effusion. Musculoskeletal: Thoracic disc degeneration. No fracture or metastatic disease in the spine or ribs. Right mastectomy with reconstruction. CT ABDOMEN FINDINGS Hepatobiliary: 22 x 34 mm cyst in the anterior liver. 28 mm cyst in the caudate lobe. Several sub cm low-density lesions liver  are indeterminate but may represent cysts. 22 x 27 mm lesion in the lateral right lobe liver with peripheral enhancement. This may represent hemangioma. Metastatic disease not excluded. Gallbladder surgically absent.  No biliary dilatation. Pancreas: Negative Spleen: Negative Adrenals/Urinary Tract: No renal mass or obstruction or stone. No adrenal mass. Stomach/Bowel: Stomach and duodenum normal. Negative for bowel obstruction. No bowel mass or edema. Vascular/Lymphatic: Mild atherosclerotic disease in the aorta without aneurysm. No abdominal lymphadenopathy. Other: No free fluid.  Small umbilical hernia.  Musculoskeletal: Grade 2 anterior slip L4-5 with severe facet degeneration and severe spinal stenosis. No fracture in the lumbar spine. No evidence of metastatic disease in the spine. IMPRESSION: Left lower lobe mass measuring 3.1 x 3.3 cm consistent with bronchogenic carcinoma. Sub solid densities superior segment left lower lobe could represent additional areas of tumor. There is malignant adenopathy in the mediastinum and right peritracheal lymph nodes. Multiple liver lesions. Many of these are benign cysts. There is an enhancing lesion in the right lobe liver which may represent a hemangioma however could represent metastatic disease. Further attention at PET scan is recommended. If this does not resolve the issue, MRI of the liver without with contrast may be helpful. Whole-body PET scanning recommended for staging. Grade 2 anterior slip L4-5 with severe spinal stenosis. At the patient's request, I reviewed the images and findings with the patient and her nephew. Electronically Signed   By: Franchot Gallo M.D.   On: 05/07/2016 16:46   Ct Abdomen W Contrast  Result Date: 05/07/2016 CLINICAL DATA:  Metastatic cancer. Malignant adenopathy right neck. History of breast cancer. EXAM: CT CHEST AND ABDOMEN WITH CONTRAST TECHNIQUE: Multidetector CT imaging of the chest and abdomen was performed following the standard protocol during bolus administration of intravenous contrast. CONTRAST:  139m ISOVUE-300 IOPAMIDOL (ISOVUE-300) INJECTION 61% COMPARISON:  CT neck 04/23/2016 FINDINGS: CT CHEST FINDINGS Cardiovascular: Normal aorta and pulmonary arteries. Heart size within normal limits. Mild coronary calcification. No pericardial effusion. Mediastinum/Nodes: Right peritracheal lymph node 18 mm. Right lower peritracheal lymph node 23 mm. Subcarinal lymph node 13 mm. Left upper paratracheal lymph node 22 mm. Left lower peritracheal lymph node 13 mm. Lungs/Pleura: Left lower lobe mass lesion measuring 3.1 x 3.3 cm with  irregular margins. Highly suspicious for bronchogenic carcinoma. Sub solid densities in the superior segment left lower lobe measuring approximately 12 mm, and 15 mm. These could represent additional areas of tumor or infection. No lung nodules on the right. Mild emphysema in the lung bases.  No pleural effusion. Musculoskeletal: Thoracic disc degeneration. No fracture or metastatic disease in the spine or ribs. Right mastectomy with reconstruction. CT ABDOMEN FINDINGS Hepatobiliary: 22 x 34 mm cyst in the anterior liver. 28 mm cyst in the caudate lobe. Several sub cm low-density lesions liver are indeterminate but may represent cysts. 22 x 27 mm lesion in the lateral right lobe liver with peripheral enhancement. This may represent hemangioma. Metastatic disease not excluded. Gallbladder surgically absent.  No biliary dilatation. Pancreas: Negative Spleen: Negative Adrenals/Urinary Tract: No renal mass or obstruction or stone. No adrenal mass. Stomach/Bowel: Stomach and duodenum normal. Negative for bowel obstruction. No bowel mass or edema. Vascular/Lymphatic: Mild atherosclerotic disease in the aorta without aneurysm. No abdominal lymphadenopathy. Other: No free fluid.  Small umbilical hernia. Musculoskeletal: Grade 2 anterior slip L4-5 with severe facet degeneration and severe spinal stenosis. No fracture in the lumbar spine. No evidence of metastatic disease in the spine. IMPRESSION: Left lower lobe mass measuring 3.1 x 3.3 cm consistent  with bronchogenic carcinoma. Sub solid densities superior segment left lower lobe could represent additional areas of tumor. There is malignant adenopathy in the mediastinum and right peritracheal lymph nodes. Multiple liver lesions. Many of these are benign cysts. There is an enhancing lesion in the right lobe liver which may represent a hemangioma however could represent metastatic disease. Further attention at PET scan is recommended. If this does not resolve the issue, MRI  of the liver without with contrast may be helpful. Whole-body PET scanning recommended for staging. Grade 2 anterior slip L4-5 with severe spinal stenosis. At the patient's request, I reviewed the images and findings with the patient and her nephew. Electronically Signed   By: Franchot Gallo M.D.   On: 05/07/2016 16:46   US Biopsy  Result Date: 04/30/2016 INDICATION: 75 year old female with mediastinal and right supraclavicular adenopathy concerning for metastatic disease. EXAM: ULTRASOUND BIOPSY CORE MEDICATIONS: None. ANESTHESIA/SEDATION: None required FLUOROSCOPY TIME:  Fluoroscopy Time: 0 minutes 0 seconds (0 mGy). COMPLICATIONS: None immediate. PROCEDURE: Informed written consent was obtained from the patient after a thorough discussion of the procedural risks, benefits and alternatives. All questions were addressed. A timeout was performed prior to the initiation of the procedure. Ultrasound was used to interrogate the right supraclavicular fossa. Multiple heterogeneous and morphologically abnormal lymph nodes are visualized. A suitable site was selected for biopsy. The overlying skin was prepped in standard fashion using chlorhexidine skin prep. The area was draped. Local anesthesia was attained by infiltration with 1% lidocaine. A small dermatotomy was made. Under real-time sonographic guidance, multiple 18 gauge core biopsies were obtained using the [employer] automated biopsy device. Biopsy specimens were placed in saline and delivered to pathology for further analysis. Post biopsy ultrasound imaging demonstrates no hematoma, hemorrhage or other complication. The patient tolerated the procedure well. IMPRESSION: Technically successful ultrasound-guided core biopsy of right supraclavicular adenopathy. Electronically Signed   By: Jacqulynn Cadet M.D.   On: 04/30/2016 16:27    ASSESSMENT: This is a very pleasant 75 years old white female recently diagnosed with at least a stage IIIB (T2a, N3, M0)  non-small cell lung cancer, adenocarcinoma presented with right lower lobe lung mass in addition to mediastinal and right supraclavicular lymphadenopathy diagnosed in October 2017.  PLAN: I had a lengthy discussion with the patient and her family today about her current disease stage, prognosis and treatment options. I recommended for the patient to complete the staging workup by ordering a PET scan as well as MRI of the brain to rule out any metastatic disease. I will also request the tissue block to be sent to [facility] one for molecular study and PDL 1 expression. I discussed with the patient her treatment options and if she has no evidence of metastatic disease she will be treated with a course of concurrent chemoradiation. I recommended for the patient a course of concurrent chemoradiation with weekly carboplatin for AUC of 2 and paclitaxel 45 MG/M2. I discussed with the patient adverse effect of the chemotherapy including but not limited to alopecia, myelosuppression, nausea and vomiting, peripheral neuropathy, liver or renal dysfunction. I will arrange for the patient to have a chemotherapy education class before starting the first dose of his chemotherapy. I will call her pharmacy with prescription for Compazine 10 mg by mouth every 6 hours as needed for nausea. She will see Dr. Tammi Klippel later today for evaluation and discussion of the radiotherapy option. She is expected to start this course of concurrent chemoradiation on 05/25/2016. The patient was seen  during the multidisciplinary thoracic oncology clinic today by medical oncology, radiation oncology, thoracic navigator, social worker and physical therapist. She would come back for follow-up visit on 06/01/2016 for evaluation and management of any adverse effect of her treatment. The patient was advised to call immediately if she has any concerning symptoms in the interval. The patient voices understanding of current disease status and  treatment options and is in agreement with the current care plan.  All questions were answered. The patient knows to call the clinic with any problems, questions or concerns. We can certainly see the patient much sooner if necessary.  Thank you so much for allowing me to participate in the care of [facility]. I will continue to follow up the patient with you and assist in her care.  I spent 55 minutes counseling the patient face to face. The total time spent in the appointment was 80 minutes.  Disclaimer: This note was dictated with voice recognition software. Similar sounding words can inadvertently be transcribed and may not be corrected upon review.   Arshia Rondon K. May 14, 2016, 3:08 PM

## 2016-05-15 ENCOUNTER — Encounter: Payer: Self-pay | Admitting: *Deleted

## 2016-05-15 ENCOUNTER — Other Ambulatory Visit (HOSPITAL_COMMUNITY)
Admission: RE | Admit: 2016-05-15 | Discharge: 2016-05-15 | Disposition: A | Discharge status: Home / Self Care | Payer: Medicare Other | Source: Ambulatory Visit | Attending: Internal Medicine | Admitting: Internal Medicine

## 2016-05-15 DIAGNOSIS — C3492 Malignant neoplasm of unspecified part of left bronchus or lung: Secondary | ICD-10-CM

## 2016-05-15 DIAGNOSIS — C760 Malignant neoplasm of head, face and neck: Secondary | ICD-10-CM | POA: Diagnosis present

## 2016-05-15 NOTE — Progress Notes (Signed)
Oncology Nurse Navigator Documentation  Oncology Nurse Navigator Flowsheets 05/15/2016  Navigator Location [facility]  Navigator Encounter Type Clinic/MDC/spoke with patient and family yesterday at [facility].  Gave and explained information on lung cancer, treatment, side effects, resources and support services at the cancer center, fall risk, energy conservation, and contact information.  I contacted pre cert, Darlena to get MRI Brain and PET scan authorized.    Abnormal Finding Date 04/23/2016  Confirmed Diagnosis Date 04/30/2016  Multidisiplinary Clinic Date 05/14/2016  Treatment Initiated Date 05/18/2016  Patient Visit Type MedOnc  Treatment Phase Pre-Tx/Tx Discussion  Barriers/Navigation Needs Coordination of Care;Education  Education Newly Diagnosed Cancer Education;Concerns with Finances/ Eligibility  Interventions Coordination of Care;Education  Coordination of Care Appts;Other  Education Method Verbal;Written  Support Groups/Services Other  Acuity Level 2  Acuity Level 2 Educational needs;Assistance expediting appointments  Time Spent with Patient 80

## 2016-05-15 NOTE — Progress Notes (Signed)
Central scheduling called me back.  They have updated patient on appt and pre procedure instructions.

## 2016-05-15 NOTE — Progress Notes (Signed)
Oncology Nurse Navigator Documentation  Oncology Nurse Navigator Flowsheets 05/15/2016  Navigator Location [facility]  Navigator Encounter Type Other/I was notified that PET scan and MRI Brain have been authorized.  I called central scheduling to get an appt.  I left vm message to call me with my name and phone number  Treatment Phase Pre-Tx/Tx Discussion  Barriers/Navigation Needs Coordination of Care  Interventions Coordination of Care  Coordination of Care Appts;Radiology  Acuity Level 2  Time Spent with Patient 30

## 2016-05-15 NOTE — Progress Notes (Signed)
  Radiation Oncology         [phone] ________________________________  Name: Alila Sotero [facility] MRN: [id number]  Date: 05/18/2016  DOB: 05-26-1941  SIMULATION AND TREATMENT PLANNING NOTE    ICD-9-CM ICD-10-CM   1. 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  Identity was confirmed.  All relevant records and images related to the planned course of therapy were reviewed.  The patient freely provided informed written consent to proceed with treatment after reviewing the details related to the planned course of therapy. The consent form was witnessed and verified by the simulation staff.  Then, the patient was set-up in a stable reproducible  supine position for radiation therapy.  CT images were obtained.  Surface markings were placed.  The CT images were loaded into the planning software.  Then the target and avoidance structures were contoured.  Treatment planning then occurred.  The radiation prescription was entered and confirmed.  Then, I designed and supervised the construction of a total of 6 medically necessary complex treatment devices, including a BodyFix immobilization mold custom fitted to the patient along with 5 multileaf collimators conformally shaped radiation around the treatment target while shielding critical structures such as the heart and spinal cord maximally.  I have requested : 3D Simulation  I have requested a DVH of the following structures: Left lung, right lung, spinal cord, heart, esophagus, and target.  I have ordered:Nutrition Consult  SPECIAL TREATMENT PROCEDURE:  The planned course of therapy using radiation constitutes a special treatment procedure. Special care is required in the management of this patient for the following reasons.  The patient  will be receiving concurrent chemotherapy requiring careful monitoring for increased toxicities of treatment including periodic laboratory values.  The special nature of the planned course of radiotherapy will require increased physician supervision and oversight to ensure patient's safety with optimal treatment outcomes.  PLAN:  The patient will receive 66 Gy in 33 fractions, pending staging studies to confirm stage IIIB.  ________________________________  Sheral Apley Tammi Klippel, M.D.

## 2016-05-18 ENCOUNTER — Ambulatory Visit
Admission: RE | Admit: 2016-05-18 | Discharge: 2016-05-18 | Disposition: A | Discharge status: Home / Self Care | Payer: Medicare Other | Source: Ambulatory Visit | Attending: Radiation Oncology | Admitting: Radiation Oncology

## 2016-05-18 DIAGNOSIS — Z510 Encounter for antineoplastic radiation therapy: Secondary | ICD-10-CM | POA: Insufficient documentation

## 2016-05-18 DIAGNOSIS — C3492 Malignant neoplasm of unspecified part of left bronchus or lung: Secondary | ICD-10-CM | POA: Insufficient documentation

## 2016-05-18 DIAGNOSIS — C7972 Secondary malignant neoplasm of left adrenal gland: Secondary | ICD-10-CM | POA: Insufficient documentation

## 2016-05-19 ENCOUNTER — Encounter: Payer: Self-pay | Admitting: *Deleted

## 2016-05-19 ENCOUNTER — Other Ambulatory Visit: Payer: Medicare Other

## 2016-05-22 ENCOUNTER — Ambulatory Visit (HOSPITAL_COMMUNITY)
Admission: RE | Admit: 2016-05-22 | Discharge: 2016-05-22 | Disposition: A | Discharge status: Home / Self Care | Payer: Medicare Other | Source: Ambulatory Visit | Attending: Internal Medicine | Admitting: Internal Medicine

## 2016-05-22 DIAGNOSIS — R911 Solitary pulmonary nodule: Secondary | ICD-10-CM | POA: Diagnosis not present

## 2016-05-22 DIAGNOSIS — C7972 Secondary malignant neoplasm of left adrenal gland: Secondary | ICD-10-CM | POA: Insufficient documentation

## 2016-05-22 DIAGNOSIS — C3492 Malignant neoplasm of unspecified part of left bronchus or lung: Secondary | ICD-10-CM | POA: Diagnosis present

## 2016-05-22 DIAGNOSIS — C771 Secondary and unspecified malignant neoplasm of intrathoracic lymph nodes: Secondary | ICD-10-CM | POA: Insufficient documentation

## 2016-05-22 DIAGNOSIS — J329 Chronic sinusitis, unspecified: Secondary | ICD-10-CM | POA: Insufficient documentation

## 2016-05-22 DIAGNOSIS — I739 Peripheral vascular disease, unspecified: Secondary | ICD-10-CM | POA: Insufficient documentation

## 2016-05-22 DIAGNOSIS — Z510 Encounter for antineoplastic radiation therapy: Secondary | ICD-10-CM | POA: Diagnosis not present

## 2016-05-22 LAB — GLUCOSE, CAPILLARY: GLUCOSE-CAPILLARY: 112 mg/dL — AB (ref 65–99)

## 2016-05-22 MED ORDER — GADOBENATE DIMEGLUMINE 529 MG/ML IV SOLN
15.0000 mL | Freq: Once | INTRAVENOUS | Status: AC | PRN
  Administered 2016-05-22: 15 mL via INTRAVENOUS

## 2016-05-22 MED ORDER — FLUDEOXYGLUCOSE F - 18 (FDG) INJECTION
8.2100 | Freq: Once | INTRAVENOUS | Status: AC | PRN
  Administered 2016-05-22: 8.21 via INTRAVENOUS

## 2016-05-25 ENCOUNTER — Other Ambulatory Visit (HOSPITAL_BASED_OUTPATIENT_CLINIC_OR_DEPARTMENT_OTHER): Payer: Medicare Other

## 2016-05-25 ENCOUNTER — Encounter (HOSPITAL_COMMUNITY): Payer: Self-pay

## 2016-05-25 ENCOUNTER — Ambulatory Visit (HOSPITAL_BASED_OUTPATIENT_CLINIC_OR_DEPARTMENT_OTHER): Payer: Medicare Other

## 2016-05-25 ENCOUNTER — Ambulatory Visit
Admission: RE | Admit: 2016-05-25 | Discharge: 2016-05-25 | Disposition: A | Discharge status: Home / Self Care | Payer: Medicare Other | Source: Ambulatory Visit | Attending: Radiation Oncology | Admitting: Radiation Oncology

## 2016-05-25 VITALS — BP 141/89 | HR 73 | Temp 98.0°F | Resp 18

## 2016-05-25 DIAGNOSIS — Z510 Encounter for antineoplastic radiation therapy: Secondary | ICD-10-CM | POA: Diagnosis not present

## 2016-05-25 DIAGNOSIS — Z5111 Encounter for antineoplastic chemotherapy: Secondary | ICD-10-CM | POA: Diagnosis not present

## 2016-05-25 DIAGNOSIS — C3431 Malignant neoplasm of lower lobe, right bronchus or lung: Secondary | ICD-10-CM | POA: Diagnosis not present

## 2016-05-25 DIAGNOSIS — C3492 Malignant neoplasm of unspecified part of left bronchus or lung: Secondary | ICD-10-CM

## 2016-05-25 LAB — CBC WITH DIFFERENTIAL/PLATELET
BASO%: 0.8 % (ref 0.0–2.0)
Basophils Absolute: 0.1 10*3/uL (ref 0.0–0.1)
EOS%: 2.3 % (ref 0.0–7.0)
Eosinophils Absolute: 0.2 10*3/uL (ref 0.0–0.5)
HEMATOCRIT: 41.6 % (ref 34.8–46.6)
HGB: 14.3 g/dL (ref 11.6–15.9)
LYMPH#: 1.3 10*3/uL (ref 0.9–3.3)
LYMPH%: 17.9 % (ref 14.0–49.7)
MCH: 30.6 pg (ref 25.1–34.0)
MCHC: 34.3 g/dL (ref 31.5–36.0)
MCV: 89.2 fL (ref 79.5–101.0)
MONO#: 0.6 10*3/uL (ref 0.1–0.9)
MONO%: 8.8 % (ref 0.0–14.0)
NEUT%: 70.2 % (ref 38.4–76.8)
NEUTROS ABS: 5.1 10*3/uL (ref 1.5–6.5)
PLATELETS: 228 10*3/uL (ref 145–400)
RBC: 4.66 10*6/uL (ref 3.70–5.45)
RDW: 13.1 % (ref 11.2–14.5)
WBC: 7.2 10*3/uL (ref 3.9–10.3)

## 2016-05-25 LAB — COMPREHENSIVE METABOLIC PANEL
ALK PHOS: 42 U/L (ref 40–150)
ALT: 20 U/L (ref 0–55)
ANION GAP: 8 meq/L (ref 3–11)
AST: 21 U/L (ref 5–34)
Albumin: 3.4 g/dL — ABNORMAL LOW (ref 3.5–5.0)
BUN: 14.1 mg/dL (ref 7.0–26.0)
CALCIUM: 10.1 mg/dL (ref 8.4–10.4)
CO2: 25 mEq/L (ref 22–29)
Chloride: 107 mEq/L (ref 98–109)
Creatinine: 0.7 mg/dL (ref 0.6–1.1)
EGFR: 87 mL/min/{1.73_m2} — ABNORMAL LOW (ref >90)
GLUCOSE: 110 mg/dL (ref 70–140)
Potassium: 4 mEq/L (ref 3.5–5.1)
Sodium: 140 mEq/L (ref 136–145)
TOTAL PROTEIN: 7.1 g/dL (ref 6.4–8.3)
Total Bilirubin: 0.98 mg/dL (ref 0.20–1.20)

## 2016-05-25 MED ORDER — DEXTROSE 5 % IV SOLN
45.0000 mg/m2 | Freq: Once | INTRAVENOUS | Status: AC
  Administered 2016-05-25: 84 mg via INTRAVENOUS
  Filled 2016-05-25: qty 14

## 2016-05-25 MED ORDER — PALONOSETRON HCL INJECTION 0.25 MG/5ML
INTRAVENOUS | Status: AC
Start: 2016-05-25 — End: 2016-05-25
  Filled 2016-05-25: qty 5

## 2016-05-25 MED ORDER — DIPHENHYDRAMINE HCL 50 MG/ML IJ SOLN
50.0000 mg | Freq: Once | INTRAMUSCULAR | Status: AC
  Administered 2016-05-25: 50 mg via INTRAVENOUS

## 2016-05-25 MED ORDER — SODIUM CHLORIDE 0.9 % IV SOLN
20.0000 mg | Freq: Once | INTRAVENOUS | Status: AC
  Administered 2016-05-25: 20 mg via INTRAVENOUS
  Filled 2016-05-25: qty 2

## 2016-05-25 MED ORDER — SODIUM CHLORIDE 0.9 % IV SOLN
Freq: Once | INTRAVENOUS | Status: AC
  Administered 2016-05-25: 10:00:00 via INTRAVENOUS

## 2016-05-25 MED ORDER — FAMOTIDINE IN NACL 20-0.9 MG/50ML-% IV SOLN
20.0000 mg | Freq: Once | INTRAVENOUS | Status: AC
  Administered 2016-05-25: 20 mg via INTRAVENOUS

## 2016-05-25 MED ORDER — DIPHENHYDRAMINE HCL 50 MG/ML IJ SOLN
INTRAMUSCULAR | Status: AC
  Filled 2016-05-25: qty 1

## 2016-05-25 MED ORDER — PALONOSETRON HCL INJECTION 0.25 MG/5ML
0.2500 mg | Freq: Once | INTRAVENOUS | Status: AC
  Administered 2016-05-25: 0.25 mg via INTRAVENOUS

## 2016-05-25 MED ORDER — FAMOTIDINE IN NACL 20-0.9 MG/50ML-% IV SOLN
INTRAVENOUS | Status: AC
  Filled 2016-05-25: qty 50

## 2016-05-25 MED ORDER — SODIUM CHLORIDE 0.9 % IV SOLN
172.4000 mg | Freq: Once | INTRAVENOUS | Status: AC
  Administered 2016-05-25: 170 mg via INTRAVENOUS
  Filled 2016-05-25: qty 17

## 2016-05-25 NOTE — Patient Instructions (Signed)
[facility] Discharge Instructions for Patients Receiving Chemotherapy  Today you received the following chemotherapy agents taxol/carboplatin  To help prevent nausea and vomiting after your treatment, we encourage you to take your nausea medication as directed If you develop nausea and vomiting that is not controlled by your nausea medication, call the clinic.   BELOW ARE SYMPTOMS THAT SHOULD BE REPORTED IMMEDIATELY:  *FEVER GREATER THAN 100.5 F  *CHILLS WITH OR WITHOUT FEVER  NAUSEA AND VOMITING THAT IS NOT CONTROLLED WITH YOUR NAUSEA MEDICATION  *UNUSUAL SHORTNESS OF BREATH  *UNUSUAL BRUISING OR BLEEDING  TENDERNESS IN MOUTH AND THROAT WITH OR WITHOUT PRESENCE OF ULCERS  *URINARY PROBLEMS  *BOWEL PROBLEMS  UNUSUAL RASH Items with * indicate a potential emergency and should be followed up as soon as possible.  Feel free to call the clinic you have any questions or concerns. The clinic phone number is [phone].  Paclitaxel injection What is this medicine? PACLITAXEL (PAK li TAX el) is a chemotherapy drug. It targets fast dividing cells, like cancer cells, and causes these cells to die. This medicine is used to treat ovarian cancer, breast cancer, and other cancers. This medicine may be used for other purposes; ask your health care provider or pharmacist if you have questions. What should I tell my health care provider before I take this medicine? They need to know if you have any of these conditions: -blood disorders -irregular heartbeat -infection (especially a virus infection such as chickenpox, cold sores, or herpes) -liver disease -previous or ongoing radiation therapy -an unusual or allergic reaction to paclitaxel, alcohol, polyoxyethylated castor oil, other chemotherapy agents, other medicines, foods, dyes, or preservatives -pregnant or trying to get pregnant -breast-feeding How should I use this medicine? This drug is given as an infusion into  a vein. It is administered in a hospital or clinic by a specially trained health care professional. Talk to your pediatrician regarding the use of this medicine in children. Special care may be needed. Overdosage: If you think you have taken too much of this medicine contact a poison control center or emergency room at once. NOTE: This medicine is only for you. Do not share this medicine with others. What if I miss a dose? It is important not to miss your dose. Call your doctor or health care professional if you are unable to keep an appointment. What may interact with this medicine? Do not take this medicine with any of the following medications: -disulfiram -metronidazole This medicine may also interact with the following medications: -cyclosporine -diazepam -ketoconazole -medicines to increase blood counts like filgrastim, pegfilgrastim, sargramostim -other chemotherapy drugs like cisplatin, doxorubicin, epirubicin, etoposide, teniposide, vincristine -quinidine -testosterone -vaccines -verapamil Talk to your doctor or health care professional before taking any of these medicines: -acetaminophen -aspirin -ibuprofen -ketoprofen -naproxen This list may not describe all possible interactions. Give your health care provider a list of all the medicines, herbs, non-prescription drugs, or dietary supplements you use. Also tell them if you smoke, drink alcohol, or use illegal drugs. Some items may interact with your medicine. What should I watch for while using this medicine? Your condition will be monitored carefully while you are receiving this medicine. You will need important blood work done while you are taking this medicine. This drug may make you feel generally unwell. This is not uncommon, as chemotherapy can affect healthy cells as well as cancer cells. Report any side effects. Continue your course of treatment even though you feel ill unless your doctor  tells you to stop. This  medicine can cause serious allergic reactions. To reduce your risk you will need to take other medicine(s) before treatment with this medicine. In some cases, you may be given additional medicines to help with side effects. Follow all directions for their use. Call your doctor or health care professional for advice if you get a fever, chills or sore throat, or other symptoms of a cold or flu. Do not treat yourself. This drug decreases your body's ability to fight infections. Try to avoid being around people who are sick. This medicine may increase your risk to bruise or bleed. Call your doctor or health care professional if you notice any unusual bleeding. Be careful brushing and flossing your teeth or using a toothpick because you may get an infection or bleed more easily. If you have any dental work done, tell your dentist you are receiving this medicine. Avoid taking products that contain aspirin, acetaminophen, ibuprofen, naproxen, or ketoprofen unless instructed by your doctor. These medicines may hide a fever. Do not become pregnant while taking this medicine. Women should inform their doctor if they wish to become pregnant or think they might be pregnant. There is a potential for serious side effects to an unborn child. Talk to your health care professional or pharmacist for more information. Do not breast-feed an infant while taking this medicine. Men are advised not to father a child while receiving this medicine. This product may contain alcohol. Ask your pharmacist or healthcare provider if this medicine contains alcohol. Be sure to tell all healthcare providers you are taking this medicine. Certain medicines, like metronidazole and disulfiram, can cause an unpleasant reaction when taken with alcohol. The reaction includes flushing, headache, nausea, vomiting, sweating, and increased thirst. The reaction can last from 30 minutes to several hours. What side effects may I notice from receiving this  medicine? Side effects that you should report to your doctor or health care professional as soon as possible: -allergic reactions like skin rash, itching or hives, swelling of the face, lips, or tongue -low blood counts - This drug may decrease the number of white blood cells, red blood cells and platelets. You may be at increased risk for infections and bleeding. -signs of infection - fever or chills, cough, sore throat, pain or difficulty passing urine -signs of decreased platelets or bleeding - bruising, pinpoint red spots on the skin, black, tarry stools, nosebleeds -signs of decreased red blood cells - unusually weak or tired, fainting spells, lightheadedness -breathing problems -chest pain -high or low blood pressure -mouth sores -nausea and vomiting -pain, swelling, redness or irritation at the injection site -pain, tingling, numbness in the hands or feet -slow or irregular heartbeat -swelling of the ankle, feet, hands Side effects that usually do not require medical attention (report to your doctor or health care professional if they continue or are bothersome): -bone pain -complete hair loss including hair on your head, underarms, pubic hair, eyebrows, and eyelashes -changes in the color of fingernails -diarrhea -loosening of the fingernails -loss of appetite -muscle or joint pain -red flush to skin -sweating This list may not describe all possible side effects. Call your doctor for medical advice about side effects. You may report side effects to FDA at 1-800-FDA-1088. Where should I keep my medicine? This drug is given in a hospital or clinic and will not be stored at home. NOTE: This sheet is a summary. It may not cover all possible information. If you have questions about this  medicine, talk to your doctor, pharmacist, or health care provider.    2016, Elsevier/Gold Standard. (2015-02-28 13:02:56)  Carboplatin injection What is this medicine? CARBOPLATIN (KAR boe pla  tin) is a chemotherapy drug. It targets fast dividing cells, like cancer cells, and causes these cells to die. This medicine is used to treat ovarian cancer and many other cancers. This medicine may be used for other purposes; ask your health care provider or pharmacist if you have questions. What should I tell my health care provider before I take this medicine? They need to know if you have any of these conditions: -blood disorders -hearing problems -kidney disease -recent or ongoing radiation therapy -an unusual or allergic reaction to carboplatin, cisplatin, other chemotherapy, other medicines, foods, dyes, or preservatives -pregnant or trying to get pregnant -breast-feeding How should I use this medicine? This drug is usually given as an infusion into a vein. It is administered in a hospital or clinic by a specially trained health care professional. Talk to your pediatrician regarding the use of this medicine in children. Special care may be needed. Overdosage: If you think you have taken too much of this medicine contact a poison control center or emergency room at once. NOTE: This medicine is only for you. Do not share this medicine with others. What if I miss a dose? It is important not to miss a dose. Call your doctor or health care professional if you are unable to keep an appointment. What may interact with this medicine? -medicines for seizures -medicines to increase blood counts like filgrastim, pegfilgrastim, sargramostim -some antibiotics like amikacin, gentamicin, neomycin, streptomycin, tobramycin -vaccines Talk to your doctor or health care professional before taking any of these medicines: -acetaminophen -aspirin -ibuprofen -ketoprofen -naproxen This list may not describe all possible interactions. Give your health care provider a list of all the medicines, herbs, non-prescription drugs, or dietary supplements you use. Also tell them if you smoke, drink alcohol, or use  illegal drugs. Some items may interact with your medicine. What should I watch for while using this medicine? Your condition will be monitored carefully while you are receiving this medicine. You will need important blood work done while you are taking this medicine. This drug may make you feel generally unwell. This is not uncommon, as chemotherapy can affect healthy cells as well as cancer cells. Report any side effects. Continue your course of treatment even though you feel ill unless your doctor tells you to stop. In some cases, you may be given additional medicines to help with side effects. Follow all directions for their use. Call your doctor or health care professional for advice if you get a fever, chills or sore throat, or other symptoms of a cold or flu. Do not treat yourself. This drug decreases your body's ability to fight infections. Try to avoid being around people who are sick. This medicine may increase your risk to bruise or bleed. Call your doctor or health care professional if you notice any unusual bleeding. Be careful brushing and flossing your teeth or using a toothpick because you may get an infection or bleed more easily. If you have any dental work done, tell your dentist you are receiving this medicine. Avoid taking products that contain aspirin, acetaminophen, ibuprofen, naproxen, or ketoprofen unless instructed by your doctor. These medicines may hide a fever. Do not become pregnant while taking this medicine. Women should inform their doctor if they wish to become pregnant or think they might be pregnant. There is a  potential for serious side effects to an unborn child. Talk to your health care professional or pharmacist for more information. Do not breast-feed an infant while taking this medicine. What side effects may I notice from receiving this medicine? Side effects that you should report to your doctor or health care professional as soon as possible: -allergic  reactions like skin rash, itching or hives, swelling of the face, lips, or tongue -signs of infection - fever or chills, cough, sore throat, pain or difficulty passing urine -signs of decreased platelets or bleeding - bruising, pinpoint red spots on the skin, black, tarry stools, nosebleeds -signs of decreased red blood cells - unusually weak or tired, fainting spells, lightheadedness -breathing problems -changes in hearing -changes in vision -chest pain -high blood pressure -low blood counts - This drug may decrease the number of white blood cells, red blood cells and platelets. You may be at increased risk for infections and bleeding. -nausea and vomiting -pain, swelling, redness or irritation at the injection site -pain, tingling, numbness in the hands or feet -problems with balance, talking, walking -trouble passing urine or change in the amount of urine Side effects that usually do not require medical attention (report to your doctor or health care professional if they continue or are bothersome): -hair loss -loss of appetite -metallic taste in the mouth or changes in taste This list may not describe all possible side effects. Call your doctor for medical advice about side effects. You may report side effects to FDA at 1-800-FDA-1088. Where should I keep my medicine? This drug is given in a hospital or clinic and will not be stored at home. NOTE: This sheet is a summary. It may not cover all possible information. If you have questions about this medicine, talk to your doctor, pharmacist, or health care provider.    2016, Elsevier/Gold Standard. (2007-10-18 14:38:05)

## 2016-05-26 ENCOUNTER — Telehealth: Payer: Self-pay | Admitting: Medical Oncology

## 2016-05-26 ENCOUNTER — Inpatient Hospital Stay
Admission: RE | Admit: 2016-05-26 | Discharge: 2016-05-26 | Disposition: A | Discharge status: Home / Self Care | Payer: Self-pay | Source: Ambulatory Visit | Attending: Radiation Oncology | Admitting: Radiation Oncology

## 2016-05-26 ENCOUNTER — Ambulatory Visit
Admission: RE | Admit: 2016-05-26 | Discharge: 2016-05-26 | Disposition: A | Discharge status: Home / Self Care | Payer: Medicare Other | Source: Ambulatory Visit | Attending: Radiation Oncology | Admitting: Radiation Oncology

## 2016-05-26 ENCOUNTER — Encounter: Payer: Self-pay | Admitting: Radiation Oncology

## 2016-05-26 VITALS — BP 142/87 | HR 74 | Resp 16 | Wt 170.4 lb

## 2016-05-26 DIAGNOSIS — Z510 Encounter for antineoplastic radiation therapy: Secondary | ICD-10-CM | POA: Diagnosis not present

## 2016-05-26 DIAGNOSIS — C3492 Malignant neoplasm of unspecified part of left bronchus or lung: Secondary | ICD-10-CM

## 2016-05-26 NOTE — Progress Notes (Signed)
Weight and vitals stable. Denies pain. Reports a dry cough. Reports shortness of breath with exertion. Denies pain associated with swallowing. Denies skin changes within treatment field. Reports mild fatigue. Received initial dose of chemotherapy yesterday.  BP (!) 142/87 (BP Location: Left Arm, Patient Position: Sitting, Cuff Size: Normal)   Pulse 74   Resp 16   Wt 170 lb 6.4 oz (77.3 kg)   SpO2 96%   BMI 32.20 kg/m  Wt Readings from Last 3 Encounters:  05/26/16 170 lb 6.4 oz (77.3 kg)  05/14/16 167 lb 14.4 oz (76.2 kg)  04/30/16 167 lb (75.8 kg)

## 2016-05-26 NOTE — Progress Notes (Signed)
Received patient and her daughter in the clinic following radiation therapy. Patient questions if radiation will address left adrenal gland met. Explained that since radiation is focused at the lesion in her lung it would not. However, went onto explain that the chemotherapy she is receiving will manage/address her left adrenal gland met. Patient verbalized understanding and expressed relief.

## 2016-05-26 NOTE — Progress Notes (Signed)
  Radiation Oncology         [phone]   Name: Molly Black MRN: [id number]   Date: 05/26/2016  DOB: [birth year]   Weekly Radiation Therapy Management    ICD-9-CM ICD-10-CM   1. Adenocarcinoma of left lung, stage 3 (HCC) 162.9 C34.92     Current Dose: 4 Gy  Planned Dose:  66 Gy  Narrative The patient presents for routine under treatment assessment. Weight and vitals stable. Denies pain. Reports a dry cough. Reports shortness of breath with exertion. Denies pain associated with swallowing. Denies skin changes within treatment field. Reports mild fatigue. Received initial dose of chemotherapy yesterday.  The patient is without complaint. Set-up films were reviewed. The chart was checked.  Physical Findings  weight is 170 lb 6.4 oz (77.3 kg). Her blood pressure is 142/87 (abnormal) and her pulse is 74. Her respiration is 16 and oxygen saturation is 96%. . Weight essentially stable.  No significant changes.  Impression The patient is tolerating radiation.  PET shows adrenal met  Plan Continue treatment as planned.  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         Molly Black Molly Black, M.D.

## 2016-05-26 NOTE — Telephone Encounter (Signed)
Left message for pt to call re tolerance of chemo

## 2016-05-27 ENCOUNTER — Ambulatory Visit
Admission: RE | Admit: 2016-05-27 | Discharge: 2016-05-27 | Disposition: A | Discharge status: Home / Self Care | Payer: Medicare Other | Source: Ambulatory Visit | Attending: Radiation Oncology | Admitting: Radiation Oncology

## 2016-05-27 DIAGNOSIS — Z510 Encounter for antineoplastic radiation therapy: Secondary | ICD-10-CM | POA: Diagnosis not present

## 2016-05-27 DIAGNOSIS — C3492 Malignant neoplasm of unspecified part of left bronchus or lung: Secondary | ICD-10-CM

## 2016-05-27 MED ORDER — RADIAPLEXRX EX GEL
Freq: Once | CUTANEOUS | Status: AC
  Administered 2016-05-27: 10:00:00 via TOPICAL

## 2016-05-28 ENCOUNTER — Ambulatory Visit
Admission: RE | Admit: 2016-05-28 | Discharge: 2016-05-28 | Disposition: A | Discharge status: Home / Self Care | Payer: Medicare Other | Source: Ambulatory Visit | Attending: Radiation Oncology | Admitting: Radiation Oncology

## 2016-05-28 DIAGNOSIS — Z510 Encounter for antineoplastic radiation therapy: Secondary | ICD-10-CM | POA: Diagnosis not present

## 2016-05-29 ENCOUNTER — Ambulatory Visit
Admission: RE | Admit: 2016-05-29 | Discharge: 2016-05-29 | Disposition: A | Discharge status: Home / Self Care | Payer: Medicare Other | Source: Ambulatory Visit | Attending: Radiation Oncology | Admitting: Radiation Oncology

## 2016-05-29 DIAGNOSIS — Z510 Encounter for antineoplastic radiation therapy: Secondary | ICD-10-CM | POA: Diagnosis not present

## 2016-06-01 ENCOUNTER — Ambulatory Visit (HOSPITAL_BASED_OUTPATIENT_CLINIC_OR_DEPARTMENT_OTHER): Payer: Medicare Other

## 2016-06-01 ENCOUNTER — Encounter: Payer: Self-pay | Admitting: Internal Medicine

## 2016-06-01 ENCOUNTER — Ambulatory Visit (HOSPITAL_BASED_OUTPATIENT_CLINIC_OR_DEPARTMENT_OTHER): Payer: Medicare Other | Admitting: Internal Medicine

## 2016-06-01 ENCOUNTER — Other Ambulatory Visit (HOSPITAL_BASED_OUTPATIENT_CLINIC_OR_DEPARTMENT_OTHER): Payer: Medicare Other

## 2016-06-01 ENCOUNTER — Telehealth: Payer: Self-pay | Admitting: Internal Medicine

## 2016-06-01 ENCOUNTER — Telehealth: Payer: Self-pay | Admitting: Radiation Oncology

## 2016-06-01 ENCOUNTER — Ambulatory Visit: Admission: RE | Admit: 2016-06-01 | Payer: Medicare Other | Source: Ambulatory Visit

## 2016-06-01 VITALS — BP 154/105 | HR 76 | Temp 97.4°F | Resp 18 | Ht 61.0 in | Wt 168.7 lb

## 2016-06-01 DIAGNOSIS — C3431 Malignant neoplasm of lower lobe, right bronchus or lung: Secondary | ICD-10-CM | POA: Diagnosis not present

## 2016-06-01 DIAGNOSIS — C3492 Malignant neoplasm of unspecified part of left bronchus or lung: Secondary | ICD-10-CM

## 2016-06-01 DIAGNOSIS — Z5111 Encounter for antineoplastic chemotherapy: Secondary | ICD-10-CM | POA: Diagnosis not present

## 2016-06-01 DIAGNOSIS — C7972 Secondary malignant neoplasm of left adrenal gland: Secondary | ICD-10-CM | POA: Diagnosis not present

## 2016-06-01 DIAGNOSIS — C3491 Malignant neoplasm of unspecified part of right bronchus or lung: Secondary | ICD-10-CM

## 2016-06-01 HISTORY — DX: Encounter for antineoplastic chemotherapy: Z51.11

## 2016-06-01 LAB — CBC WITH DIFFERENTIAL/PLATELET
BASO%: 0.8 % (ref 0.0–2.0)
Basophils Absolute: 0 10*3/uL (ref 0.0–0.1)
EOS ABS: 0.2 10*3/uL (ref 0.0–0.5)
EOS%: 3.1 % (ref 0.0–7.0)
HCT: 40.7 % (ref 34.8–46.6)
HEMOGLOBIN: 14.1 g/dL (ref 11.6–15.9)
LYMPH%: 18.6 % (ref 14.0–49.7)
MCH: 30.8 pg (ref 25.1–34.0)
MCHC: 34.6 g/dL (ref 31.5–36.0)
MCV: 89 fL (ref 79.5–101.0)
MONO#: 0.4 10*3/uL (ref 0.1–0.9)
MONO%: 7.2 % (ref 0.0–14.0)
NEUT%: 70.3 % (ref 38.4–76.8)
NEUTROS ABS: 4 10*3/uL (ref 1.5–6.5)
Platelets: 244 10*3/uL (ref 145–400)
RBC: 4.57 10*6/uL (ref 3.70–5.45)
RDW: 13.2 % (ref 11.2–14.5)
WBC: 5.7 10*3/uL (ref 3.9–10.3)
lymph#: 1.1 10*3/uL (ref 0.9–3.3)

## 2016-06-01 LAB — COMPREHENSIVE METABOLIC PANEL
ALBUMIN: 3.5 g/dL (ref 3.5–5.0)
ALK PHOS: 39 U/L — AB (ref 40–150)
ALT: 21 U/L (ref 0–55)
AST: 20 U/L (ref 5–34)
Anion Gap: 8 mEq/L (ref 3–11)
BILIRUBIN TOTAL: 0.72 mg/dL (ref 0.20–1.20)
BUN: 12.5 mg/dL (ref 7.0–26.0)
CO2: 25 mEq/L (ref 22–29)
CREATININE: 0.6 mg/dL (ref 0.6–1.1)
Calcium: 9.5 mg/dL (ref 8.4–10.4)
Chloride: 105 mEq/L (ref 98–109)
EGFR: 90 mL/min/{1.73_m2} — AB (ref >90)
GLUCOSE: 80 mg/dL (ref 70–140)
Potassium: 4.1 mEq/L (ref 3.5–5.1)
SODIUM: 139 meq/L (ref 136–145)
TOTAL PROTEIN: 6.8 g/dL (ref 6.4–8.3)

## 2016-06-01 MED ORDER — FAMOTIDINE IN NACL 20-0.9 MG/50ML-% IV SOLN
20.0000 mg | Freq: Once | INTRAVENOUS | Status: AC
  Administered 2016-06-01: 20 mg via INTRAVENOUS

## 2016-06-01 MED ORDER — SODIUM CHLORIDE 0.9 % IV SOLN
20.0000 mg | Freq: Once | INTRAVENOUS | Status: AC
  Administered 2016-06-01: 20 mg via INTRAVENOUS
  Filled 2016-06-01: qty 2

## 2016-06-01 MED ORDER — PALONOSETRON HCL INJECTION 0.25 MG/5ML
INTRAVENOUS | Status: AC
  Filled 2016-06-01: qty 5

## 2016-06-01 MED ORDER — SODIUM CHLORIDE 0.9 % IV SOLN
172.4000 mg | Freq: Once | INTRAVENOUS | Status: AC
  Administered 2016-06-01: 170 mg via INTRAVENOUS
  Filled 2016-06-01: qty 17

## 2016-06-01 MED ORDER — SODIUM CHLORIDE 0.9 % IV SOLN
Freq: Once | INTRAVENOUS | Status: AC
  Administered 2016-06-01: 13:00:00 via INTRAVENOUS

## 2016-06-01 MED ORDER — PALONOSETRON HCL INJECTION 0.25 MG/5ML
0.2500 mg | Freq: Once | INTRAVENOUS | Status: AC
  Administered 2016-06-01: 0.25 mg via INTRAVENOUS

## 2016-06-01 MED ORDER — PACLITAXEL CHEMO INJECTION 300 MG/50ML
45.0000 mg/m2 | Freq: Once | INTRAVENOUS | Status: AC
  Administered 2016-06-01: 84 mg via INTRAVENOUS
  Filled 2016-06-01: qty 14

## 2016-06-01 MED ORDER — DIPHENHYDRAMINE HCL 50 MG/ML IJ SOLN
50.0000 mg | Freq: Once | INTRAMUSCULAR | Status: AC
  Administered 2016-06-01: 50 mg via INTRAVENOUS

## 2016-06-01 MED ORDER — FAMOTIDINE IN NACL 20-0.9 MG/50ML-% IV SOLN
INTRAVENOUS | Status: AC
  Filled 2016-06-01: qty 50

## 2016-06-01 MED ORDER — DIPHENHYDRAMINE HCL 50 MG/ML IJ SOLN
INTRAMUSCULAR | Status: AC
  Filled 2016-06-01: qty 1

## 2016-06-01 NOTE — Patient Instructions (Signed)
[facility] Discharge Instructions for Patients Receiving Chemotherapy  Today you received the following chemotherapy agents taxol/carboplatin  To help prevent nausea and vomiting after your treatment, we encourage you to take your nausea medication as directed   If you develop nausea and vomiting that is not controlled by your nausea medication, call the clinic.   BELOW ARE SYMPTOMS THAT SHOULD BE REPORTED IMMEDIATELY:  *FEVER GREATER THAN 100.5 F  *CHILLS WITH OR WITHOUT FEVER  NAUSEA AND VOMITING THAT IS NOT CONTROLLED WITH YOUR NAUSEA MEDICATION  *UNUSUAL SHORTNESS OF BREATH  *UNUSUAL BRUISING OR BLEEDING  TENDERNESS IN MOUTH AND THROAT WITH OR WITHOUT PRESENCE OF ULCERS  *URINARY PROBLEMS  *BOWEL PROBLEMS  UNUSUAL RASH Items with * indicate a potential emergency and should be followed up as soon as possible.  Feel free to call the clinic you have any questions or concerns. The clinic phone number is [phone].  

## 2016-06-01 NOTE — Progress Notes (Signed)
[facility] [phone]   [phone]  OFFICE PROGRESS NOTE  Irven Shelling, MD [place] [address] [place] [address]  DIAGNOSIS: Stage IV (T2a, N3, M1b) non-small cell lung cancer, adenocarcinoma presented with right lower lobe lung mass in addition to mediastinal and bilateral supraclavicular lymphadenopathy as well as questionable metastasis to the left adrenal gland diagnosed in October 2017.  PRIOR THERAPY: None.   CURRENT THERAPY: A course of concurrent chemoradiation with weekly carboplatin for AUC of 2 and paclitaxel 45 MG/M2. Status post one cycle. She is also expected to undergo stereotactic body radiotherapy to the metastatic left adrenal lesion under the care of Dr. Tammi Klippel.   INTERVAL HISTORY: Molly Black 75 y.o. female returns to the clinic today for follow-up visit accompanied by several family members. The patient is feeling fine with no specific complaints. She tolerated the first week of her concurrent chemoradiation fairly well. The PET scan showed suspicious metastasis to the left adrenal gland. I discussed the results with the patient and Dr. Tammi Klippel. He would like to continue with the course of concurrent chemoradiation and consider the patient for SBRT to the left adrenal gland lesion. She denied having any nausea or vomiting. She has no fever or chills. She denied having any significant chest pain, shortness breath, cough or hemoptysis. She is here today for evaluation before starting cycle #2.  MEDICAL HISTORY: Past Medical History:  Diagnosis Date  . Anxiety    with death of brother none since  . Carcinoma of breast, stage 2, estrogen receptor negative, right ([place])    T2N0 right breast mastectomy/TRAM reconstruction ER negative PR positive  June 1989  Then [facility] chemo  . Cystadenofibroma of ovary, unspecified laterality   . Diverticulosis of colon without diverticulitis   . Gastric ulcer   . GERD  (gastroesophageal reflux disease)    takes Nexium daily  . H/O hiatal hernia   . Hemangioma of liver   . Hiatal hernia   . Neuropathy, peripheral ([place])   . OSA on CPAP    setting 15- uses occ.  . Pneumonia    at age 24 years old  . PONV (postoperative nausea and vomiting)   . Schatzki's ring   . Shortness of breath   . Skin cancer of face    "had some places frozen off" (04/13/2013)  . Tubular adenoma of colon     ALLERGIES:  has No Known Allergies.  MEDICATIONS:  Current Outpatient Prescriptions  Medication Sig Dispense Refill  . ALPRAZolam (XANAX) 0.5 MG tablet     . aspirin 81 MG tablet Take 81 mg by mouth daily.    . beta carotene w/minerals (OCUVITE) tablet Take 1 tablet by mouth daily.    . Calcium Carbonate-Vitamin D (CALCIUM 600+D PO) Take by mouth 2 (two) times daily.    . cephALEXin (KEFLEX) 500 MG capsule     . Cholecalciferol (VITAMIN D3) 3000 UNITS TABS Take 1 tablet by mouth daily.    . Cyanocobalamin 2500 MCG TABS Take 1 tablet by mouth daily.    . metroNIDAZOLE (FLAGYL) 500 MG tablet Take 1 tablet (500 mg total) by mouth 3 (three) times daily. 21 tablet 0  . Multiple Vitamin (MULTIVITAMIN WITH MINERALS) TABS tablet Take 1 tablet by mouth daily.    . Omega-3 Fatty Acids (FISH OIL) 1000 MG CAPS Take 2,000 mg by mouth daily.    Marland Kitchen omeprazole (PRILOSEC) 40 MG capsule Take 1 capsule (40  mg total) by mouth 2 (two) times daily. 30 minutes before breakfast and 30 minutes before dinner 180 capsule 1  . Probiotic Product (SUPER PROBIOTIC PO) Take 1 tablet by mouth daily.    . prochlorperazine (COMPAZINE) 10 MG tablet Take 1 tablet (10 mg total) by mouth every 6 (six) hours as needed for nausea or vomiting. 30 tablet 0  . sucralfate (CARAFATE) 1 g tablet Take 1 tablet (1 g total) by mouth 3 (three) times daily with meals. 90 tablet 5  . Wound Cleansers (RADIAPLEX EX) Apply topically.     No current facility-administered medications for this visit.     SURGICAL HISTORY:    Past Surgical History:  Procedure Laterality Date  . ABDOMINAL HYSTERECTOMY  1989  . ANKLE FRACTURE SURGERY Right ?1996  . BREAST BIOPSY Right 1963; 1981; 1989  . BREAST CAPSULECTOMY WITH IMPLANT EXCHANGE Right 1/61/0960   "silicon gel implant" (4/54/0981)  . BREAST IMPLANT REMOVAL Right 04/13/2013   Procedure: REMOVAL RIGHT RUPTURED BREAST IMPLANTS, DELAYED BREAST RECONSTRUCTION WITH SILICONE GEL IMPLANTS;  Surgeon: Crissie Reese, MD;  Location: [facility];  Service: Plastics;  Laterality: Right;  . BREAST LUMPECTOMY Right 1963; 1981; 1989   "benign; benign; malignant"  . BREAST RECONSTRUCTION Right   . BREAST RECONSTRUCTION WITH PLACEMENT OF TISSUE EXPANDER AND FLEX HD (ACELLULAR HYDRATED DERMIS) Right 1989  . CAPSULECTOMY Right 04/13/2013   Procedure: CAPSULECTOMY;  Surgeon: Crissie Reese, MD;  Location: [facility];  Service: Plastics;  Laterality: Right;  . CATARACT EXTRACTION W/PHACO Right 10/18/2012   Procedure: CATARACT EXTRACTION PHACO AND INTRAOCULAR LENS PLACEMENT (IOC);  Surgeon: Elta Guadeloupe T. Gershon Crane, MD;  Location: [facility];  Service: Ophthalmology;  Laterality: Right;  CDE:7.67  . CATARACT EXTRACTION W/PHACO Left 11/01/2012   Procedure: CATARACT EXTRACTION PHACO AND INTRAOCULAR LENS PLACEMENT (IOC);  Surgeon: Elta Guadeloupe T. Gershon Crane, MD;  Location: [facility];  Service: Ophthalmology;  Laterality: Left;  CDE:9.92  . CHOLECYSTECTOMY  1990's  . ESOPHAGOGASTRODUODENOSCOPY N/A 10/13/2013   Procedure: ESOPHAGOGASTRODUODENOSCOPY (EGD);  Surgeon: Jerene Bears, MD;  Location: [facility];  Service: Gastroenterology;  Laterality: N/A;  . MASTECTOMY COMPLETE / SIMPLE W/ SENTINEL NODE BIOPSY Right 1989  . RECONSTRUCTION / CORRECTION OF NIPPLE / AEROLA Right 1989  . WRIST FRACTURE SURGERY Right ~ 2007   "put a pin in it" (04/13/2013)    REVIEW OF SYSTEMS:  A comprehensive review of systems was negative except for: Constitutional: positive for fatigue   PHYSICAL EXAMINATION: General appearance: alert, cooperative,  fatigued and no distress Head: Normocephalic, without obvious abnormality, atraumatic Neck: no adenopathy, no JVD, supple, symmetrical, trachea midline and thyroid not enlarged, symmetric, no tenderness/mass/nodules Lymph nodes: Cervical, supraclavicular, and axillary nodes normal. Resp: clear to auscultation bilaterally Back: symmetric, no curvature. ROM normal. No CVA tenderness. Cardio: regular rate and rhythm, S1, S2 normal, no murmur, click, rub or gallop GI: soft, non-tender; bowel sounds normal; no masses,  no organomegaly Extremities: extremities normal, atraumatic, no cyanosis or edema  ECOG PERFORMANCE STATUS: 1 - Symptomatic but completely ambulatory  Blood pressure (!) 154/105, pulse 76, temperature 97.4 F (36.3 C), temperature source Oral, resp. rate 18, height '5\' 1"'$  (1.549 m), weight 168 lb 11.2 oz (76.5 kg), SpO2 100 %.  LABORATORY DATA: Lab Results  Component Value Date   WBC 5.7 06/01/2016   HGB 14.1 06/01/2016   HCT 40.7 06/01/2016   MCV 89.0 06/01/2016   PLT 244 06/01/2016      Chemistry      Component Value Date/Time  NA 139 06/01/2016 1007   K 4.1 06/01/2016 1007   CL 102 04/05/2013 1154   CO2 25 06/01/2016 1007   BUN 12.5 06/01/2016 1007   CREATININE 0.6 06/01/2016 1007      Component Value Date/Time   CALCIUM 9.5 06/01/2016 1007   ALKPHOS 39 (L) 06/01/2016 1007   AST 20 06/01/2016 1007   ALT 21 06/01/2016 1007   BILITOT 0.72 06/01/2016 1007       RADIOGRAPHIC STUDIES: Ct Chest W Contrast  Result Date: 05/07/2016 CLINICAL DATA:  Metastatic cancer. Malignant adenopathy right neck. History of breast cancer. EXAM: CT CHEST AND ABDOMEN WITH CONTRAST TECHNIQUE: Multidetector CT imaging of the chest and abdomen was performed following the standard protocol during bolus administration of intravenous contrast. CONTRAST:  152m ISOVUE-300 IOPAMIDOL (ISOVUE-300) INJECTION 61% COMPARISON:  CT neck 04/23/2016 FINDINGS: CT CHEST FINDINGS Cardiovascular:  Normal aorta and pulmonary arteries. Heart size within normal limits. Mild coronary calcification. No pericardial effusion. Mediastinum/Nodes: Right peritracheal lymph node 18 mm. Right lower peritracheal lymph node 23 mm. Subcarinal lymph node 13 mm. Left upper paratracheal lymph node 22 mm. Left lower peritracheal lymph node 13 mm. Lungs/Pleura: Left lower lobe mass lesion measuring 3.1 x 3.3 cm with irregular margins. Highly suspicious for bronchogenic carcinoma. Sub solid densities in the superior segment left lower lobe measuring approximately 12 mm, and 15 mm. These could represent additional areas of tumor or infection. No lung nodules on the right. Mild emphysema in the lung bases.  No pleural effusion. Musculoskeletal: Thoracic disc degeneration. No fracture or metastatic disease in the spine or ribs. Right mastectomy with reconstruction. CT ABDOMEN FINDINGS Hepatobiliary: 22 x 34 mm cyst in the anterior liver. 28 mm cyst in the caudate lobe. Several sub cm low-density lesions liver are indeterminate but may represent cysts. 22 x 27 mm lesion in the lateral right lobe liver with peripheral enhancement. This may represent hemangioma. Metastatic disease not excluded. Gallbladder surgically absent.  No biliary dilatation. Pancreas: Negative Spleen: Negative Adrenals/Urinary Tract: No renal mass or obstruction or stone. No adrenal mass. Stomach/Bowel: Stomach and duodenum normal. Negative for bowel obstruction. No bowel mass or edema. Vascular/Lymphatic: Mild atherosclerotic disease in the aorta without aneurysm. No abdominal lymphadenopathy. Other: No free fluid.  Small umbilical hernia. Musculoskeletal: Grade 2 anterior slip L4-5 with severe facet degeneration and severe spinal stenosis. No fracture in the lumbar spine. No evidence of metastatic disease in the spine. IMPRESSION: Left lower lobe mass measuring 3.1 x 3.3 cm consistent with bronchogenic carcinoma. Sub solid densities superior segment left lower  lobe could represent additional areas of tumor. There is malignant adenopathy in the mediastinum and right peritracheal lymph nodes. Multiple liver lesions. Many of these are benign cysts. There is an enhancing lesion in the right lobe liver which may represent a hemangioma however could represent metastatic disease. Further attention at PET scan is recommended. If this does not resolve the issue, MRI of the liver without with contrast may be helpful. Whole-body PET scanning recommended for staging. Grade 2 anterior slip L4-5 with severe spinal stenosis. At the patient's request, I reviewed the images and findings with the patient and her nephew. Electronically Signed   By: CFranchot GalloM.D.   On: 05/07/2016 16:46   Ct Abdomen W Contrast  Result Date: 05/07/2016 CLINICAL DATA:  Metastatic cancer. Malignant adenopathy right neck. History of breast cancer. EXAM: CT CHEST AND ABDOMEN WITH CONTRAST TECHNIQUE: Multidetector CT imaging of the chest and abdomen was performed following the standard protocol  during bolus administration of intravenous contrast. CONTRAST:  131m ISOVUE-300 IOPAMIDOL (ISOVUE-300) INJECTION 61% COMPARISON:  CT neck 04/23/2016 FINDINGS: CT CHEST FINDINGS Cardiovascular: Normal aorta and pulmonary arteries. Heart size within normal limits. Mild coronary calcification. No pericardial effusion. Mediastinum/Nodes: Right peritracheal lymph node 18 mm. Right lower peritracheal lymph node 23 mm. Subcarinal lymph node 13 mm. Left upper paratracheal lymph node 22 mm. Left lower peritracheal lymph node 13 mm. Lungs/Pleura: Left lower lobe mass lesion measuring 3.1 x 3.3 cm with irregular margins. Highly suspicious for bronchogenic carcinoma. Sub solid densities in the superior segment left lower lobe measuring approximately 12 mm, and 15 mm. These could represent additional areas of tumor or infection. No lung nodules on the right. Mild emphysema in the lung bases.  No pleural effusion.  Musculoskeletal: Thoracic disc degeneration. No fracture or metastatic disease in the spine or ribs. Right mastectomy with reconstruction. CT ABDOMEN FINDINGS Hepatobiliary: 22 x 34 mm cyst in the anterior liver. 28 mm cyst in the caudate lobe. Several sub cm low-density lesions liver are indeterminate but may represent cysts. 22 x 27 mm lesion in the lateral right lobe liver with peripheral enhancement. This may represent hemangioma. Metastatic disease not excluded. Gallbladder surgically absent.  No biliary dilatation. Pancreas: Negative Spleen: Negative Adrenals/Urinary Tract: No renal mass or obstruction or stone. No adrenal mass. Stomach/Bowel: Stomach and duodenum normal. Negative for bowel obstruction. No bowel mass or edema. Vascular/Lymphatic: Mild atherosclerotic disease in the aorta without aneurysm. No abdominal lymphadenopathy. Other: No free fluid.  Small umbilical hernia. Musculoskeletal: Grade 2 anterior slip L4-5 with severe facet degeneration and severe spinal stenosis. No fracture in the lumbar spine. No evidence of metastatic disease in the spine. IMPRESSION: Left lower lobe mass measuring 3.1 x 3.3 cm consistent with bronchogenic carcinoma. Sub solid densities superior segment left lower lobe could represent additional areas of tumor. There is malignant adenopathy in the mediastinum and right peritracheal lymph nodes. Multiple liver lesions. Many of these are benign cysts. There is an enhancing lesion in the right lobe liver which may represent a hemangioma however could represent metastatic disease. Further attention at PET scan is recommended. If this does not resolve the issue, MRI of the liver without with contrast may be helpful. Whole-body PET scanning recommended for staging. Grade 2 anterior slip L4-5 with severe spinal stenosis. At the patient's request, I reviewed the images and findings with the patient and her nephew. Electronically Signed   By: CFranchot GalloM.D.   On: 05/07/2016  16:46   Mr BJeri CosWZHContrast  Result Date: 05/22/2016 CLINICAL DATA:  Adenocarcinoma of the LEFT lung,  staging. EXAM: MRI HEAD WITHOUT AND WITH CONTRAST TECHNIQUE: Multiplanar, multiecho pulse sequences of the brain and surrounding structures were obtained without and with intravenous contrast. CONTRAST:  120mMULTIHANCE GADOBENATE DIMEGLUMINE 529 MG/ML IV SOLN COMPARISON:  CT neck 04/23/2016.  PET scan earlier today. FINDINGS: The patient was unable to remain motionless for the exam. Small or subtle lesions could be overlooked. Brain: No evidence for acute infarction, hemorrhage, mass lesion, hydrocephalus, or extra-axial fluid. Moderate atrophy. Mild subcortical and periventricular T2 and FLAIR hyperintensities, likely chronic microvascular ischemic change. Post infusion, no abnormal enhancement of the brain or meninges. Vascular: Flow voids are maintained throughout the carotid, basilar, and vertebral arteries. There are no areas of chronic hemorrhage. Major dural venous sinuses are patent. Skull and upper cervical spine: Unremarkable visualized calvarium, skullbase, and cervical vertebrae. Pituitary, pineal, cerebellar tonsils unremarkable. No upper cervical cord lesions.  Cervical spondylosis. Sinuses/Orbits: Chronic and acute paranasal sinus disease. Layering fluid with bubbly secretions RIGHT maxillary sinus. Other: None. Compared with prior neck CT, the area of concern represented tortuous vessels in the LEFT sylvian fissure. No parenchymal lesion is evident on this motion degraded scan. IMPRESSION: Motion degraded examination demonstrating no definite intra-axial brain metastases. Atrophy and small vessel disease is observed. Acute and chronic sinusitis. No definite osseous disease. Electronically Signed   By: Staci Righter M.D.   On: 05/22/2016 11:16   Nm Pet Image Initial (pi) Skull Base To Thigh  Result Date: 05/22/2016 CLINICAL DATA:  Initial treatment strategy for LEFT lung carcinoma. EXAM:  NUCLEAR MEDICINE PET SKULL BASE TO THIGH TECHNIQUE: 8.2 mCi F-18 FDG was injected intravenously. Full-ring PET imaging was performed from the skull base to thigh after the radiotracer. CT data was obtained and used for attenuation correction and anatomic localization. FASTING BLOOD GLUCOSE:  Value: 112 mg/dl COMPARISON:  CT 04/2011 17 FINDINGS: NECK Bilateral hypermetabolic supraclavicular lymph nodes. RIGHT supraclavicular lymph node measures 18 mm short axis with SUV max 15.3. CHEST LEFT and RIGHT paratracheal nodal metastasis. RIGHT paratracheal lymph node measures 23 mm short axis with SUV max equal 15.4. LEFT lower lobe mass measures 3.5 cm (image 32, series 7) with SUV max equal 19.3. A ground-glass nodule in the more superior LEFT lower lobe measures 1.5 cm (image 22, series 7). This does not have clear metabolic activity. ABDOMEN/PELVIS Several low-density lesions in the liver without metabolic activity. There is hypermetabolic activity associated with the thickened LEFT adrenal gland measuring 13 mm with SUV max equal 13.9. No hypermetabolic abdominal pelvic lymph nodes. SKELETON No focal hypermetabolic activity to suggest skeletal metastasis. IMPRESSION: 1. Hypermetabolic LEFT lower lobe mass consists with bronchogenic carcinoma. 2. Ground-glass nodule in the LEFT lower lobe is suspicious but not metabolic. 3. Hypermetabolic ipsilateral and contralateral mediastinal nodal metastasis. 4. Hypermetabolic bilateral supraclavicular nodal metastasis. 5. Hypermetabolic metastasis to the LEFT adrenal gland. Electronically Signed   By: Suzy Bouchard M.D.   On: 05/22/2016 09:33    ASSESSMENT AND PLAN: This is a very pleasant 75 years old white female with a stage IV non-small cell lung cancer with solitary left adrenal metastasis. She is currently undergoing a course of concurrent chemoradiation with weekly carboplatin and paclitaxel is status post 1 cycle to the disease in the chest and she is expected to  have SBRT to the left adrenal metastasis on under the care of Dr. Tammi Klippel. The patient related the first week of her concurrent chemoradiation fairly well with no significant adverse effects. I recommended for the patient to proceed with cycle #2 today as scheduled. She would come back for follow-up visit in 2 weeks for evaluation before starting cycle #4. The patient was advised to call immediately if she has any concerning symptoms in the interval. The patient voices understanding of current disease status and treatment options and is in agreement with the current care plan.  All questions were answered. The patient knows to call the clinic with any problems, questions or concerns. We can certainly see the patient much sooner if necessary.  Disclaimer: This note was dictated with voice recognition software. Similar sounding words can inadvertently be transcribed and may not be corrected upon review.

## 2016-06-01 NOTE — Telephone Encounter (Signed)
Added f/u to 11/20 lab/chemo. Patient/relative aware and will get print out in infusion.

## 2016-06-01 NOTE — Telephone Encounter (Signed)
Received email message from therapist that the patient reports she is coughing up blood. Phoned patient to inquire. No answer. Left message requesting return call.

## 2016-06-02 ENCOUNTER — Ambulatory Visit
Admission: RE | Admit: 2016-06-02 | Discharge: 2016-06-02 | Disposition: A | Discharge status: Home / Self Care | Payer: Medicare Other | Source: Ambulatory Visit | Attending: Radiation Oncology | Admitting: Radiation Oncology

## 2016-06-02 DIAGNOSIS — Z510 Encounter for antineoplastic radiation therapy: Secondary | ICD-10-CM | POA: Diagnosis not present

## 2016-06-03 ENCOUNTER — Ambulatory Visit
Admission: RE | Admit: 2016-06-03 | Discharge: 2016-06-03 | Disposition: A | Discharge status: Home / Self Care | Payer: Medicare Other | Source: Ambulatory Visit | Attending: Radiation Oncology | Admitting: Radiation Oncology

## 2016-06-03 ENCOUNTER — Encounter (HOSPITAL_COMMUNITY): Payer: Self-pay

## 2016-06-03 DIAGNOSIS — Z510 Encounter for antineoplastic radiation therapy: Secondary | ICD-10-CM | POA: Diagnosis not present

## 2016-06-04 ENCOUNTER — Ambulatory Visit
Admission: RE | Admit: 2016-06-04 | Discharge: 2016-06-04 | Disposition: A | Discharge status: Home / Self Care | Payer: Medicare Other | Source: Ambulatory Visit | Attending: Radiation Oncology | Admitting: Radiation Oncology

## 2016-06-04 DIAGNOSIS — Z510 Encounter for antineoplastic radiation therapy: Secondary | ICD-10-CM | POA: Diagnosis not present

## 2016-06-05 ENCOUNTER — Ambulatory Visit
Admission: RE | Admit: 2016-06-05 | Discharge: 2016-06-05 | Disposition: A | Discharge status: Home / Self Care | Payer: Medicare Other | Source: Ambulatory Visit | Attending: Radiation Oncology | Admitting: Radiation Oncology

## 2016-06-05 VITALS — BP 137/87 | HR 91 | Temp 98.2°F | Resp 22 | Wt 166.6 lb

## 2016-06-05 DIAGNOSIS — Z510 Encounter for antineoplastic radiation therapy: Secondary | ICD-10-CM | POA: Diagnosis not present

## 2016-06-05 DIAGNOSIS — C3491 Malignant neoplasm of unspecified part of right bronchus or lung: Secondary | ICD-10-CM

## 2016-06-05 NOTE — Progress Notes (Addendum)
Weekly rad txs  Left lng 9/30  Completed ,  Has sinus  Congestion, coughs up clear phelgm, feels when eating foods her throat is sore and gets full feeling, ate oatmeal this am and yogurt, pizza for lunch, ginger ale and water ,  Short of breath when talking, ambulating any distance,  The other night  Had difficuklty breathing and woke up, not using c-pap,  Looks like thrush on her tongue, on flagyl and keflex, also taking caafate but was swallowing whole tablet, discussed how to dissolve in 30 ml water before taking before meals, and bedtime, fatigued, 3:47 PM BP 137/87 (BP Location: Left Arm, Patient Position: Sitting, Cuff Size: Normal)   Pulse 91   Temp 98.2 F (36.8 C) (Oral)   Resp (!) 22   Wt 166 lb 9.6 oz (75.6 kg)   SpO2 98% Comment: room air  BMI 31.48 kg/m

## 2016-06-05 NOTE — Progress Notes (Signed)
  Radiation Oncology         [phone]   Name: Molly Black MRN: [id number]   Date: 06/05/2016  DOB: Jan 30, 1941   Weekly Radiation Therapy Management    ICD-9-CM ICD-10-CM   1. Adenocarcinoma of right lung, stage 4 (HCC) 162.9 C34.91     Current Dose: 18 Gy  Planned Dose:  60 Gy  Narrative The patient presents for routine under treatment assessment.  Weekly rad txs, left lng 9/30 completed. Has sinus congestion, coughs up clear phlegm. Feels a sore throat when eating food her throat. Gets full easily after eating. Short of breath when talking, ambulating any distance. The other night she had difficulty breathing and woke up, not using c-pap. The nurse notes what looks like thrush on her tongue, on flagyl and keflex. Also taking carafate, but was swallowing the whole tablet. The nurse discussed how to dissolve the tablet in 30 ml water and drinking it before meals and bedtime. She also reports fatigue.  Set-up films were reviewed. The chart was checked.  Physical Findings  weight is 166 lb 9.6 oz (75.6 kg). Her oral temperature is 98.2 F (36.8 C). Her blood pressure is 137/87 and her pulse is 91. Her respiration is 22 (abnormal) and oxygen saturation is 98%.   Weight essentially stable. White coating of the tongue, but does not appear to be thrush.  Impression The patient is tolerating radiation.  PET shows adrenal met.  Plan Continue treatment as planned.  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         Sheral Apley Tammi Klippel, M.D.  This document serves as a record of services personally performed by Tyler Pita, MD. It was created on his behalf by Darcus Austin, a trained medical scribe. The creation of this record is based on the scribe's personal observations and the provider's statements to them. This document has been checked and approved by the attending provider.

## 2016-06-08 ENCOUNTER — Ambulatory Visit (HOSPITAL_BASED_OUTPATIENT_CLINIC_OR_DEPARTMENT_OTHER): Payer: Medicare Other

## 2016-06-08 ENCOUNTER — Other Ambulatory Visit: Payer: Self-pay | Admitting: Radiation Oncology

## 2016-06-08 ENCOUNTER — Ambulatory Visit
Admission: RE | Admit: 2016-06-08 | Discharge: 2016-06-08 | Disposition: A | Discharge status: Home / Self Care | Payer: Medicare Other | Source: Ambulatory Visit | Attending: Radiation Oncology | Admitting: Radiation Oncology

## 2016-06-08 ENCOUNTER — Other Ambulatory Visit (HOSPITAL_BASED_OUTPATIENT_CLINIC_OR_DEPARTMENT_OTHER): Payer: Medicare Other

## 2016-06-08 VITALS — BP 137/88 | HR 89 | Temp 98.0°F | Resp 17

## 2016-06-08 DIAGNOSIS — C3431 Malignant neoplasm of lower lobe, right bronchus or lung: Secondary | ICD-10-CM

## 2016-06-08 DIAGNOSIS — Z5111 Encounter for antineoplastic chemotherapy: Secondary | ICD-10-CM | POA: Diagnosis not present

## 2016-06-08 DIAGNOSIS — Z510 Encounter for antineoplastic radiation therapy: Secondary | ICD-10-CM | POA: Diagnosis not present

## 2016-06-08 DIAGNOSIS — C3491 Malignant neoplasm of unspecified part of right bronchus or lung: Secondary | ICD-10-CM

## 2016-06-08 DIAGNOSIS — C3492 Malignant neoplasm of unspecified part of left bronchus or lung: Secondary | ICD-10-CM

## 2016-06-08 LAB — COMPREHENSIVE METABOLIC PANEL
ALT: 26 U/L (ref 0–55)
ANION GAP: 10 meq/L (ref 3–11)
AST: 21 U/L (ref 5–34)
Albumin: 3.4 g/dL — ABNORMAL LOW (ref 3.5–5.0)
Alkaline Phosphatase: 38 U/L — ABNORMAL LOW (ref 40–150)
BILIRUBIN TOTAL: 1.29 mg/dL — AB (ref 0.20–1.20)
BUN: 11.4 mg/dL (ref 7.0–26.0)
CHLORIDE: 105 meq/L (ref 98–109)
CO2: 26 meq/L (ref 22–29)
Calcium: 9.9 mg/dL (ref 8.4–10.4)
Creatinine: 0.6 mg/dL (ref 0.6–1.1)
EGFR: 89 mL/min/{1.73_m2} — AB (ref >90)
Glucose: 89 mg/dl (ref 70–140)
POTASSIUM: 4.2 meq/L (ref 3.5–5.1)
Sodium: 141 mEq/L (ref 136–145)
TOTAL PROTEIN: 6.8 g/dL (ref 6.4–8.3)

## 2016-06-08 LAB — CBC WITH DIFFERENTIAL/PLATELET
BASO%: 0.5 % (ref 0.0–2.0)
Basophils Absolute: 0 10*3/uL (ref 0.0–0.1)
EOS ABS: 0.1 10*3/uL (ref 0.0–0.5)
EOS%: 2 % (ref 0.0–7.0)
HCT: 39.7 % (ref 34.8–46.6)
HGB: 14 g/dL (ref 11.6–15.9)
LYMPH%: 17.5 % (ref 14.0–49.7)
MCH: 31.4 pg (ref 25.1–34.0)
MCHC: 35.3 g/dL (ref 31.5–36.0)
MCV: 89 fL (ref 79.5–101.0)
MONO#: 0.4 10*3/uL (ref 0.1–0.9)
MONO%: 8.4 % (ref 0.0–14.0)
NEUT#: 3.2 10*3/uL (ref 1.5–6.5)
NEUT%: 71.6 % (ref 38.4–76.8)
PLATELETS: 207 10*3/uL (ref 145–400)
RBC: 4.46 10*6/uL (ref 3.70–5.45)
RDW: 13.5 % (ref 11.2–14.5)
WBC: 4.4 10*3/uL (ref 3.9–10.3)
lymph#: 0.8 10*3/uL — ABNORMAL LOW (ref 0.9–3.3)

## 2016-06-08 MED ORDER — DEXAMETHASONE SODIUM PHOSPHATE 10 MG/ML IJ SOLN
INTRAMUSCULAR | Status: AC
  Filled 2016-06-08: qty 1

## 2016-06-08 MED ORDER — DIPHENHYDRAMINE HCL 50 MG/ML IJ SOLN
50.0000 mg | Freq: Once | INTRAMUSCULAR | Status: AC
  Administered 2016-06-08: 50 mg via INTRAVENOUS

## 2016-06-08 MED ORDER — CARBOPLATIN CHEMO INJECTION 450 MG/45ML
172.4000 mg | Freq: Once | INTRAVENOUS | Status: AC
  Administered 2016-06-08: 170 mg via INTRAVENOUS
  Filled 2016-06-08: qty 17

## 2016-06-08 MED ORDER — DIPHENHYDRAMINE HCL 50 MG/ML IJ SOLN
INTRAMUSCULAR | Status: AC
  Filled 2016-06-08: qty 1

## 2016-06-08 MED ORDER — SODIUM CHLORIDE 0.9 % IV SOLN
Freq: Once | INTRAVENOUS | Status: AC
  Administered 2016-06-08: 14:00:00 via INTRAVENOUS

## 2016-06-08 MED ORDER — PALONOSETRON HCL INJECTION 0.25 MG/5ML
0.2500 mg | Freq: Once | INTRAVENOUS | Status: AC
  Administered 2016-06-08: 0.25 mg via INTRAVENOUS

## 2016-06-08 MED ORDER — SUCRALFATE 1 G PO TABS: 1.0000 g | ORAL_TABLET | Freq: Four times a day (QID) | ORAL | 3 refills | Status: DC

## 2016-06-08 MED ORDER — SODIUM CHLORIDE 0.9 % IV SOLN
20.0000 mg | Freq: Once | INTRAVENOUS | Status: AC
  Administered 2016-06-08: 20 mg via INTRAVENOUS
  Filled 2016-06-08: qty 2

## 2016-06-08 MED ORDER — PALONOSETRON HCL INJECTION 0.25 MG/5ML
INTRAVENOUS | Status: AC
Start: 2016-06-08 — End: 2016-06-08
  Filled 2016-06-08: qty 5

## 2016-06-08 MED ORDER — FAMOTIDINE IN NACL 20-0.9 MG/50ML-% IV SOLN
INTRAVENOUS | Status: AC
  Filled 2016-06-08: qty 50

## 2016-06-08 MED ORDER — FAMOTIDINE IN NACL 20-0.9 MG/50ML-% IV SOLN
20.0000 mg | Freq: Once | INTRAVENOUS | Status: AC
  Administered 2016-06-08: 20 mg via INTRAVENOUS

## 2016-06-08 MED ORDER — PACLITAXEL CHEMO INJECTION 300 MG/50ML
45.0000 mg/m2 | Freq: Once | INTRAVENOUS | Status: AC
  Administered 2016-06-08: 84 mg via INTRAVENOUS
  Filled 2016-06-08: qty 14

## 2016-06-08 NOTE — Patient Instructions (Signed)
[facility] Discharge Instructions for Patients Receiving Chemotherapy  Today you received the following chemotherapy agents Taxol, Carboplatin  To help prevent nausea and vomiting after your treatment, we encourage you to take your nausea medication as directed.   If you develop nausea and vomiting that is not controlled by your nausea medication, call the clinic.   BELOW ARE SYMPTOMS THAT SHOULD BE REPORTED IMMEDIATELY:  *FEVER GREATER THAN 100.5 F  *CHILLS WITH OR WITHOUT FEVER  NAUSEA AND VOMITING THAT IS NOT CONTROLLED WITH YOUR NAUSEA MEDICATION  *UNUSUAL SHORTNESS OF BREATH  *UNUSUAL BRUISING OR BLEEDING  TENDERNESS IN MOUTH AND THROAT WITH OR WITHOUT PRESENCE OF ULCERS  *URINARY PROBLEMS  *BOWEL PROBLEMS  UNUSUAL RASH Items with * indicate a potential emergency and should be followed up as soon as possible.  Feel free to call the clinic you have any questions or concerns. The clinic phone number is [phone].    

## 2016-06-09 ENCOUNTER — Ambulatory Visit
Admission: RE | Admit: 2016-06-09 | Discharge: 2016-06-09 | Disposition: A | Discharge status: Home / Self Care | Payer: Medicare Other | Source: Ambulatory Visit | Attending: Radiation Oncology | Admitting: Radiation Oncology

## 2016-06-09 DIAGNOSIS — Z510 Encounter for antineoplastic radiation therapy: Secondary | ICD-10-CM | POA: Diagnosis not present

## 2016-06-10 ENCOUNTER — Ambulatory Visit
Admission: RE | Admit: 2016-06-10 | Discharge: 2016-06-10 | Disposition: A | Discharge status: Home / Self Care | Payer: Medicare Other | Source: Ambulatory Visit | Attending: Radiation Oncology | Admitting: Radiation Oncology

## 2016-06-10 DIAGNOSIS — Z510 Encounter for antineoplastic radiation therapy: Secondary | ICD-10-CM | POA: Diagnosis not present

## 2016-06-11 ENCOUNTER — Encounter: Payer: Self-pay | Admitting: Radiation Oncology

## 2016-06-11 ENCOUNTER — Ambulatory Visit
Admission: RE | Admit: 2016-06-11 | Discharge: 2016-06-11 | Disposition: A | Discharge status: Home / Self Care | Payer: Medicare Other | Source: Ambulatory Visit | Attending: Radiation Oncology | Admitting: Radiation Oncology

## 2016-06-11 VITALS — BP 126/87 | HR 92 | Temp 97.7°F | Ht 61.0 in | Wt 169.2 lb

## 2016-06-11 DIAGNOSIS — Z171 Estrogen receptor negative status [ER-]: Principal | ICD-10-CM

## 2016-06-11 DIAGNOSIS — C7970 Secondary malignant neoplasm of unspecified adrenal gland: Secondary | ICD-10-CM

## 2016-06-11 DIAGNOSIS — Z510 Encounter for antineoplastic radiation therapy: Secondary | ICD-10-CM | POA: Diagnosis not present

## 2016-06-11 DIAGNOSIS — C50911 Malignant neoplasm of unspecified site of right female breast: Secondary | ICD-10-CM

## 2016-06-11 DIAGNOSIS — C801 Malignant (primary) neoplasm, unspecified: Secondary | ICD-10-CM

## 2016-06-11 DIAGNOSIS — C7972 Secondary malignant neoplasm of left adrenal gland: Secondary | ICD-10-CM

## 2016-06-11 DIAGNOSIS — C3491 Malignant neoplasm of unspecified part of right bronchus or lung: Secondary | ICD-10-CM

## 2016-06-11 NOTE — Progress Notes (Signed)
Weekly rad txs  Left lng 13/30  Completed ,  Has sinus  Congestion, sinus drainage clear had small of blood this past week stated it was when she blow her nose.   Feels when eating foods her throat is sore and gets full feeling.  Short of breath when talking, ambulating any distance, improving since starting radiation treatments.   Not using c-pap.  Tongue looks better no thrush, on flagyl and keflex, also taking caafate but was swallowing whole tablet, now dissolving in 30 ml water before taking before meals, and bedtime.   Reports fatigue takes a nap; feels she is doing more since starting radiation. Wt Readings from Last 3 Encounters:  06/11/16 169 lb 3.2 oz (76.7 kg)  06/05/16 166 lb 9.6 oz (75.6 kg)  06/01/16 168 lb 11.2 oz (76.5 kg)  BP 126/87   Pulse 92   Temp 97.7 F (36.5 C) (Oral)   Resp 18   Ht '5\' 1"'$  (1.549 m)   Wt 169 lb 3.2 oz (76.7 kg)   SpO2 99%   BMI 31.97 kg/m

## 2016-06-11 NOTE — Progress Notes (Signed)
  Radiation Oncology         [phone] ________________________________  Name: Molly Black MRN: [id number]  Date: 06/11/2016  DOB: 14-Jul-1941  STEREOTACTIC BODY RADIOTHERAPY SIMULATION AND TREATMENT PLANNING NOTE    ICD-9-CM ICD-10-CM   1. 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  Identity was confirmed.  All relevant records and images related to the planned course of therapy were reviewed.  The patient freely provided informed written consent to proceed with treatment after reviewing the details related to the planned course of therapy. The consent form was witnessed and verified by the simulation staff.  Then, the patient was set-up in a stable reproducible  supine position for radiation therapy.  A BodyFix immobilization pillow was fabricated for reproducible positioning.  Surface markings were placed.  The CT images were loaded into the planning software.  The gross target volumes (GTV) and planning target volumes (PTV) were delinieated, and avoidance structures were contoured.  Treatment planning then occurred.  The radiation prescription was entered and confirmed.  A total of two complex treatment devices were fabricated in the form of the BodyFix immobilization pillow and a neck accuform cushion.  I have requested : 3D Simulation  I have requested a DVH of the following structures: targets and all normal structures near the target including left kidney, right kidney, spinal cord, stomach and liver as noted on the radiation plan to maintain doses in adherence with established limits  PLAN:  The patient will receive 50 Gy in 5  fractions.  ________________________________  Sheral Apley Tammi Klippel, M.D.

## 2016-06-11 NOTE — Progress Notes (Signed)
  Radiation Oncology         [phone]   Name: Molly Black MRN: [id number]   Date: 06/11/2016  DOB: 1941-02-22   Weekly Radiation Therapy Management    ICD-9-CM ICD-10-CM   1. Carcinoma of breast, stage 2, estrogen receptor negative, right (HCC) 174.9 C50.911    V86.1 Z17.1   2. Adenocarcinoma of right lung, stage 4 (HCC) 162.9 C34.91   3. 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  The patient had SBRT CT simulation earlier today to a solitary isolated adrenal oligometastasis.  Weekly rad txs left lung 13/30 completed. She has sinus congestion. Sinus drainage is clear, but she had a small of blood this past week when she blew her nose. Reports a sore throat when she eats.She is short of breath when talking or ambulating any distance. This is improving since starting radiation treatments.Not using c-pap. Tongue looks better with no thrush; on flagyl and keflex. She's also taking Carafate, but was swallowing the whole tablet. She is now dissolving itin 30 ml water before taking meals and at bedtime. She reports it is providing some relief. Reports fatigue and believes she is taking more naps since starting radiation.  Set-up films were reviewed. The chart was checked.  Physical Findings  height is _0  (1.549 m) and weight is 169 lb 3.2 oz (76.7 kg). Her oral temperature is 97.7 F (36.5 C). Her blood pressure is 126/87 and her pulse is 92. . Weight essentially stable.  No significant changes.  Impression The patient is tolerating radiation.  PET shows adrenal met.  Plan Continue treatment as planned.         Sheral Apley Tammi Klippel, M.D.  This document serves as a record of services personally performed by Tyler Pita, MD. It was created on his behalf by Darcus Austin, a trained medical scribe. The creation of  this record is based on the scribe's personal observations and the provider's statements to them. This document has been checked and approved by the attending provider.

## 2016-06-11 NOTE — Progress Notes (Signed)
Weekly rad txs Left lng 13/30 Completed , Has sinus Congestion, sinus drainage clear had small of blood this past week stated it was when she blow her nose.   Feels when eating foods her throat is sore and gets full feeling. Short of breath when tWeekly rad txs Left lng 13/30 Completed , Has sinus Congestion, sinus drainage clear had small of blood this past week stated it was when she blow her nose.   Feels when eating foods her throat is sore and gets full feeling. Short of breath when talking, ambulating any distance, improving since starting radiation treatments. Not using c-pap.  Tongue looks better no thrush, on flagyl and keflex, also taking caafate but was swallowing whole tablet, now dissolving in 30 ml water before taking before meals, and bedtime.   Reports fatigue takes a nap; feels she is doing more since starting radiation.    Wt Readings from Last 3 Encounters:  06/11/16 169 lb 3.2 oz (76.7 kg)  06/05/16 166 lb 9.6 oz (75.6 kg)  06/01/16 168 lb 11.2 oz (76.5 kg)  BP 126/87   Pulse 92   Temp 97.7 F (36.5 C) (Oral)   Resp 18   Ht '5\' 1"'$  (1.549 m)   Wt 169 lb 3.2 oz (76.7 kg)   SpO2 99%   BMI

## 2016-06-12 ENCOUNTER — Ambulatory Visit
Admission: RE | Admit: 2016-06-12 | Discharge: 2016-06-12 | Disposition: A | Discharge status: Home / Self Care | Payer: Medicare Other | Source: Ambulatory Visit | Attending: Radiation Oncology | Admitting: Radiation Oncology

## 2016-06-12 DIAGNOSIS — Z510 Encounter for antineoplastic radiation therapy: Secondary | ICD-10-CM | POA: Diagnosis not present

## 2016-06-14 ENCOUNTER — Ambulatory Visit
Admission: RE | Admit: 2016-06-14 | Discharge: 2016-06-14 | Disposition: A | Discharge status: Home / Self Care | Payer: Medicare Other | Source: Ambulatory Visit | Attending: Radiation Oncology | Admitting: Radiation Oncology

## 2016-06-14 DIAGNOSIS — Z510 Encounter for antineoplastic radiation therapy: Secondary | ICD-10-CM | POA: Diagnosis not present

## 2016-06-15 ENCOUNTER — Ambulatory Visit (HOSPITAL_BASED_OUTPATIENT_CLINIC_OR_DEPARTMENT_OTHER): Payer: Medicare Other | Admitting: Oncology

## 2016-06-15 ENCOUNTER — Other Ambulatory Visit (HOSPITAL_BASED_OUTPATIENT_CLINIC_OR_DEPARTMENT_OTHER): Payer: Medicare Other

## 2016-06-15 ENCOUNTER — Ambulatory Visit (HOSPITAL_BASED_OUTPATIENT_CLINIC_OR_DEPARTMENT_OTHER): Payer: Medicare Other

## 2016-06-15 ENCOUNTER — Telehealth: Payer: Self-pay | Admitting: Internal Medicine

## 2016-06-15 ENCOUNTER — Ambulatory Visit
Admission: RE | Admit: 2016-06-15 | Discharge: 2016-06-15 | Disposition: A | Discharge status: Home / Self Care | Payer: Medicare Other | Source: Ambulatory Visit | Attending: Radiation Oncology | Admitting: Radiation Oncology

## 2016-06-15 ENCOUNTER — Encounter: Payer: Self-pay | Admitting: Oncology

## 2016-06-15 VITALS — BP 126/84 | HR 90 | Temp 98.0°F | Resp 14 | Ht 61.0 in | Wt 168.4 lb

## 2016-06-15 DIAGNOSIS — C3431 Malignant neoplasm of lower lobe, right bronchus or lung: Secondary | ICD-10-CM

## 2016-06-15 DIAGNOSIS — C3492 Malignant neoplasm of unspecified part of left bronchus or lung: Secondary | ICD-10-CM

## 2016-06-15 DIAGNOSIS — Z5111 Encounter for antineoplastic chemotherapy: Secondary | ICD-10-CM | POA: Diagnosis not present

## 2016-06-15 DIAGNOSIS — Z510 Encounter for antineoplastic radiation therapy: Secondary | ICD-10-CM | POA: Diagnosis not present

## 2016-06-15 DIAGNOSIS — C7972 Secondary malignant neoplasm of left adrenal gland: Secondary | ICD-10-CM | POA: Diagnosis not present

## 2016-06-15 DIAGNOSIS — C3491 Malignant neoplasm of unspecified part of right bronchus or lung: Secondary | ICD-10-CM

## 2016-06-15 LAB — CBC WITH DIFFERENTIAL/PLATELET
BASO%: 0.5 % (ref 0.0–2.0)
BASOS ABS: 0 10*3/uL (ref 0.0–0.1)
EOS%: 1.1 % (ref 0.0–7.0)
Eosinophils Absolute: 0.1 10*3/uL (ref 0.0–0.5)
HEMATOCRIT: 41.6 % (ref 34.8–46.6)
HGB: 14.1 g/dL (ref 11.6–15.9)
LYMPH#: 0.5 10*3/uL — AB (ref 0.9–3.3)
LYMPH%: 10.2 % — AB (ref 14.0–49.7)
MCH: 30.5 pg (ref 25.1–34.0)
MCHC: 33.8 g/dL (ref 31.5–36.0)
MCV: 90.4 fL (ref 79.5–101.0)
MONO#: 0.5 10*3/uL (ref 0.1–0.9)
MONO%: 9 % (ref 0.0–14.0)
NEUT#: 4.3 10*3/uL (ref 1.5–6.5)
NEUT%: 79.2 % — AB (ref 38.4–76.8)
Platelets: 222 10*3/uL (ref 145–400)
RBC: 4.61 10*6/uL (ref 3.70–5.45)
RDW: 13.5 % (ref 11.2–14.5)
WBC: 5.4 10*3/uL (ref 3.9–10.3)

## 2016-06-15 LAB — COMPREHENSIVE METABOLIC PANEL WITH GFR
ALT: 24 U/L (ref 0–55)
AST: 21 U/L (ref 5–34)
Albumin: 3.5 g/dL (ref 3.5–5.0)
Alkaline Phosphatase: 39 U/L — ABNORMAL LOW (ref 40–150)
Anion Gap: 9 meq/L (ref 3–11)
BUN: 12.9 mg/dL (ref 7.0–26.0)
CO2: 24 meq/L (ref 22–29)
Calcium: 10.1 mg/dL (ref 8.4–10.4)
Chloride: 107 meq/L (ref 98–109)
Creatinine: 0.6 mg/dL (ref 0.6–1.1)
EGFR: 89 ml/min/1.73 m2 — ABNORMAL LOW
Glucose: 76 mg/dL (ref 70–140)
Potassium: 4.2 meq/L (ref 3.5–5.1)
Sodium: 140 meq/L (ref 136–145)
Total Bilirubin: 1.21 mg/dL — ABNORMAL HIGH (ref 0.20–1.20)
Total Protein: 6.9 g/dL (ref 6.4–8.3)

## 2016-06-15 MED ORDER — FAMOTIDINE IN NACL 20-0.9 MG/50ML-% IV SOLN
20.0000 mg | Freq: Once | INTRAVENOUS | Status: AC
  Administered 2016-06-15: 20 mg via INTRAVENOUS

## 2016-06-15 MED ORDER — PALONOSETRON HCL INJECTION 0.25 MG/5ML
0.2500 mg | Freq: Once | INTRAVENOUS | Status: AC
  Administered 2016-06-15: 0.25 mg via INTRAVENOUS

## 2016-06-15 MED ORDER — DIPHENHYDRAMINE HCL 50 MG/ML IJ SOLN
50.0000 mg | Freq: Once | INTRAMUSCULAR | Status: AC
  Administered 2016-06-15: 50 mg via INTRAVENOUS

## 2016-06-15 MED ORDER — SODIUM CHLORIDE 0.9 % IV SOLN
172.4000 mg | Freq: Once | INTRAVENOUS | Status: AC
  Administered 2016-06-15: 170 mg via INTRAVENOUS
  Filled 2016-06-15: qty 17

## 2016-06-15 MED ORDER — SODIUM CHLORIDE 0.9 % IV SOLN
Freq: Once | INTRAVENOUS | Status: AC
  Administered 2016-06-15: 14:00:00 via INTRAVENOUS

## 2016-06-15 MED ORDER — SODIUM CHLORIDE 0.9 % IV SOLN
20.0000 mg | Freq: Once | INTRAVENOUS | Status: AC
  Administered 2016-06-15: 20 mg via INTRAVENOUS
  Filled 2016-06-15: qty 2

## 2016-06-15 MED ORDER — DIPHENHYDRAMINE HCL 50 MG/ML IJ SOLN
INTRAMUSCULAR | Status: AC
  Filled 2016-06-15: qty 1

## 2016-06-15 MED ORDER — PACLITAXEL CHEMO INJECTION 300 MG/50ML
45.0000 mg/m2 | Freq: Once | INTRAVENOUS | Status: AC
  Administered 2016-06-15: 84 mg via INTRAVENOUS
  Filled 2016-06-15: qty 14

## 2016-06-15 MED ORDER — FAMOTIDINE IN NACL 20-0.9 MG/50ML-% IV SOLN
INTRAVENOUS | Status: AC
  Filled 2016-06-15: qty 50

## 2016-06-15 MED ORDER — PALONOSETRON HCL INJECTION 0.25 MG/5ML
INTRAVENOUS | Status: AC
  Filled 2016-06-15: qty 5

## 2016-06-15 NOTE — Patient Instructions (Signed)
[facility] Discharge Instructions for Patients Receiving Chemotherapy  Today you received the following chemotherapy agents Paclitaxel/Carboplatin.   To help prevent nausea and vomiting after your treatment, we encourage you to take your nausea medication as directed.    If you develop nausea and vomiting that is not controlled by your nausea medication, call the clinic.   BELOW ARE SYMPTOMS THAT SHOULD BE REPORTED IMMEDIATELY:  *FEVER GREATER THAN 100.5 F  *CHILLS WITH OR WITHOUT FEVER  NAUSEA AND VOMITING THAT IS NOT CONTROLLED WITH YOUR NAUSEA MEDICATION  *UNUSUAL SHORTNESS OF BREATH  *UNUSUAL BRUISING OR BLEEDING  TENDERNESS IN MOUTH AND THROAT WITH OR WITHOUT PRESENCE OF ULCERS  *URINARY PROBLEMS  *BOWEL PROBLEMS  UNUSUAL RASH Items with * indicate a potential emergency and should be followed up as soon as possible.  Feel free to call the clinic you have any questions or concerns. The clinic phone number is [phone].  Please show the [facility] at check-in to the Emergency Department and triage nurse.

## 2016-06-15 NOTE — Progress Notes (Signed)
[facility] [phone]   [phone]  OFFICE PROGRESS NOTE  Irven Shelling, MD [address] [place] [address]  DIAGNOSIS: Stage IV (T2a, N3, M1b) non-small cell lung cancer, adenocarcinoma presented with right lower lobe lung mass in addition to mediastinal and bilateral supraclavicular lymphadenopathy as well as questionable metastasis to the left adrenal gland diagnosed in October 2017.  MOLECULAR STUDY: (Foundation one): Genomic Alterations Identified? BRAF G469S CDKN2A p16INK4a deletion exons 1-2 and p14ARF deletion exon 2 KMT2C (MLL3) O2774* JO87 splice site 867E>H Additional Findings? Microsatellite status MS-Stable Tumor Mutation Burden TMB-Intermediate; 12 Muts/Mb Additional Disease-relevant Genes with No Reportable Alterations Identified? EGFR KRAS ALK MET RET ERBB2 ROS1  PRIOR THERAPY: None.   CURRENT THERAPY: A course of concurrent chemoradiation with weekly carboplatin for AUC of 2 and paclitaxel 45 MG/M2. Status post 3 cycles. She is also expected to undergo stereotactic body radiotherapy to the metastatic left adrenal lesion under the care of Dr. Tammi Klippel.   INTERVAL HISTORY: Molly Black 75 y.o. female returns to the clinic today for follow-up visit with her nephew. The patient is feeling fine with no specific complaints. She tolerated the first week of her concurrent chemoradiation fairly well. She denied having any nausea or vomiting. She has no fever or chills. She denied having any significant chest pain, shortness breath, cough or hemoptysis. She is here today for evaluation before starting cycle #4.  MEDICAL HISTORY: Past Medical History:  Diagnosis Date  . Adenocarcinoma of right lung, stage 4 ([place]) 05-18-16  . Anxiety    with death of brother none since  . Carcinoma of breast, stage 2, estrogen receptor negative, right ([facility])    T2N0 right breast mastectomy/TRAM reconstruction ER  negative PR positive  June 1989  Then CM[facility] chemo  . Cystadenofibroma of ovary, unspecified laterality   . Diverticulosis of colon without diverticulitis   . Encounter for antineoplastic chemotherapy 06/01/2016  . Gastric ulcer   . GERD (gastroesophageal reflux disease)    takes Nexium daily  . H/O hiatal hernia   . Hemangioma of liver   . Hiatal hernia   . Neuropathy, peripheral ([facility])   . OSA on CPAP    setting 15- uses occ.  . Pneumonia    at age 58 years old  . PONV (postoperative nausea and vomiting)   . Schatzki's ring   . Shortness of breath   . Skin cancer of face    "had some places frozen off" (04/13/2013)  . Tubular adenoma of colon     ALLERGIES:  has No Known Allergies.  MEDICATIONS:  Current Outpatient Prescriptions  Medication Sig Dispense Refill  . ALPRAZolam (XANAX) 0.5 MG tablet     . aspirin 81 MG tablet Take 81 mg by mouth daily.    . beta carotene w/minerals (OCUVITE) tablet Take 1 tablet by mouth daily.    . Calcium Carbonate-Vitamin D (CALCIUM 600+D PO) Take by mouth 2 (two) times daily.    . cephALEXin (KEFLEX) 500 MG capsule Take 500 mg by mouth.     . Cholecalciferol (VITAMIN D3) 3000 UNITS TABS Take 1 tablet by mouth daily.    . Cyanocobalamin 2500 MCG TABS Take 1 tablet by mouth daily.    . feeding supplement (ENSURE CLINICAL STRENGTH) LIQD Take 237 mLs by mouth AC breakfast.    . metroNIDAZOLE (FLAGYL) 500 MG tablet Take 1 tablet (500 mg total) by mouth 3 (three) times daily. 21 tablet 0  .  Multiple Vitamin (MULTIVITAMIN WITH MINERALS) TABS tablet Take 1 tablet by mouth daily.    . Omega-3 Fatty Acids (FISH OIL) 1000 MG CAPS Take 2,000 mg by mouth daily.    Marland Kitchen omeprazole (PRILOSEC) 40 MG capsule Take 1 capsule (40 mg total) by mouth 2 (two) times daily. 30 minutes before breakfast and 30 minutes before dinner 180 capsule 1  . Probiotic Product (SUPER PROBIOTIC PO) Take 1 tablet by mouth daily.    . prochlorperazine (COMPAZINE) 10 MG tablet Take 1 tablet  (10 mg total) by mouth every 6 (six) hours as needed for nausea or vomiting. 30 tablet 0  . sucralfate (CARAFATE) 1 g tablet Take 1 tablet (1 g total) by mouth 4 (four) times daily. 120 tablet 3  . Wound Cleansers (RADIAPLEX EX) Apply topically.     No current facility-administered medications for this visit.     SURGICAL HISTORY:  Past Surgical History:  Procedure Laterality Date  . ABDOMINAL HYSTERECTOMY  1989  . ANKLE FRACTURE SURGERY Right ?1996  . BREAST BIOPSY Right 1963; 1981; 1989  . BREAST CAPSULECTOMY WITH IMPLANT EXCHANGE Right 11/29/3974   "silicon gel implant" (7/34/1937)  . BREAST IMPLANT REMOVAL Right 04/13/2013   Procedure: REMOVAL RIGHT RUPTURED BREAST IMPLANTS, DELAYED BREAST RECONSTRUCTION WITH SILICONE GEL IMPLANTS;  Surgeon: Crissie Reese, MD;  Location: [facility];  Service: Plastics;  Laterality: Right;  . BREAST LUMPECTOMY Right 1963; 1981; 1989   "benign; benign; malignant"  . BREAST RECONSTRUCTION Right   . BREAST RECONSTRUCTION WITH PLACEMENT OF TISSUE EXPANDER AND FLEX HD (ACELLULAR HYDRATED DERMIS) Right 1989  . CAPSULECTOMY Right 04/13/2013   Procedure: CAPSULECTOMY;  Surgeon: Crissie Reese, MD;  Location: [facility];  Service: Plastics;  Laterality: Right;  . CATARACT EXTRACTION W/PHACO Right 10/18/2012   Procedure: CATARACT EXTRACTION PHACO AND INTRAOCULAR LENS PLACEMENT (IOC);  Surgeon: Elta Guadeloupe T. Gershon Crane, MD;  Location: [facility];  Service: Ophthalmology;  Laterality: Right;  CDE:7.67  . CATARACT EXTRACTION W/PHACO Left 11/01/2012   Procedure: CATARACT EXTRACTION PHACO AND INTRAOCULAR LENS PLACEMENT (IOC);  Surgeon: Elta Guadeloupe T. Gershon Crane, MD;  Location: [facility];  Service: Ophthalmology;  Laterality: Left;  CDE:9.92  . CHOLECYSTECTOMY  1990's  . ESOPHAGOGASTRODUODENOSCOPY N/A 10/13/2013   Procedure: ESOPHAGOGASTRODUODENOSCOPY (EGD);  Surgeon: Jerene Bears, MD;  Location: [facility];  Service: Gastroenterology;  Laterality: N/A;  . MASTECTOMY COMPLETE / SIMPLE W/ SENTINEL NODE BIOPSY  Right 1989  . RECONSTRUCTION / CORRECTION OF NIPPLE / AEROLA Right 1989  . WRIST FRACTURE SURGERY Right ~ 2007   "put a pin in it" (04/13/2013)    REVIEW OF SYSTEMS:  A comprehensive review of systems was negative except for: Constitutional: positive for fatigue   PHYSICAL EXAMINATION: General appearance: alert, cooperative, fatigued and no distress Head: Normocephalic, without obvious abnormality, atraumatic Neck: no adenopathy, no JVD, supple, symmetrical, trachea midline and thyroid not enlarged, symmetric, no tenderness/mass/nodules Lymph nodes: Cervical, supraclavicular, and axillary nodes normal. Resp: clear to auscultation bilaterally Back: symmetric, no curvature. ROM normal. No CVA tenderness. Cardio: regular rate and rhythm, S1, S2 normal, no murmur, click, rub or gallop GI: soft, non-tender; bowel sounds normal; no masses,  no organomegaly Extremities: extremities normal, atraumatic, no cyanosis or edema  ECOG PERFORMANCE STATUS: 1 - Symptomatic but completely ambulatory  There were no vitals taken for this visit.  LABORATORY DATA: Lab Results  Component Value Date   WBC 5.4 06/15/2016   HGB 14.1 06/15/2016   HCT 41.6 06/15/2016   MCV 90.4 06/15/2016   PLT 222  06/15/2016      Chemistry      Component Value Date/Time   NA 140 06/15/2016 1026   K 4.2 06/15/2016 1026   CL 102 04/05/2013 1154   CO2 24 06/15/2016 1026   BUN 12.9 06/15/2016 1026   CREATININE 0.6 06/15/2016 1026      Component Value Date/Time   CALCIUM 10.1 06/15/2016 1026   ALKPHOS 39 (L) 06/15/2016 1026   AST 21 06/15/2016 1026   ALT 24 06/15/2016 1026   BILITOT 1.21 (H) 06/15/2016 1026       RADIOGRAPHIC STUDIES: Mr Jeri Cos IY Contrast  Result Date: 05/22/2016 CLINICAL DATA:  Adenocarcinoma of the LEFT lung,  staging. EXAM: MRI HEAD WITHOUT AND WITH CONTRAST TECHNIQUE: Multiplanar, multiecho pulse sequences of the brain and surrounding structures were obtained without and with intravenous  contrast. CONTRAST:  8m MULTIHANCE GADOBENATE DIMEGLUMINE 529 MG/ML IV SOLN COMPARISON:  CT neck 04/23/2016.  PET scan earlier today. FINDINGS: The patient was unable to remain motionless for the exam. Small or subtle lesions could be overlooked. Brain: No evidence for acute infarction, hemorrhage, mass lesion, hydrocephalus, or extra-axial fluid. Moderate atrophy. Mild subcortical and periventricular T2 and FLAIR hyperintensities, likely chronic microvascular ischemic change. Post infusion, no abnormal enhancement of the brain or meninges. Vascular: Flow voids are maintained throughout the carotid, basilar, and vertebral arteries. There are no areas of chronic hemorrhage. Major dural venous sinuses are patent. Skull and upper cervical spine: Unremarkable visualized calvarium, skullbase, and cervical vertebrae. Pituitary, pineal, cerebellar tonsils unremarkable. No upper cervical cord lesions. Cervical spondylosis. Sinuses/Orbits: Chronic and acute paranasal sinus disease. Layering fluid with bubbly secretions RIGHT maxillary sinus. Other: None. Compared with prior neck CT, the area of concern represented tortuous vessels in the LEFT sylvian fissure. No parenchymal lesion is evident on this motion degraded scan. IMPRESSION: Motion degraded examination demonstrating no definite intra-axial brain metastases. Atrophy and small vessel disease is observed. Acute and chronic sinusitis. No definite osseous disease. Electronically Signed   By: JStaci RighterM.D.   On: 05/22/2016 11:16   Nm Pet Image Initial (pi) Skull Base To Thigh  Result Date: 05/22/2016 CLINICAL DATA:  Initial treatment strategy for LEFT lung carcinoma. EXAM: NUCLEAR MEDICINE PET SKULL BASE TO THIGH TECHNIQUE: 8.2 mCi F-18 FDG was injected intravenously. Full-ring PET imaging was performed from the skull base to thigh after the radiotracer. CT data was obtained and used for attenuation correction and anatomic localization. FASTING BLOOD GLUCOSE:   Value: 112 mg/dl COMPARISON:  CT 04/2011 17 FINDINGS: NECK Bilateral hypermetabolic supraclavicular lymph nodes. RIGHT supraclavicular lymph node measures 18 mm short axis with SUV max 15.3. CHEST LEFT and RIGHT paratracheal nodal metastasis. RIGHT paratracheal lymph node measures 23 mm short axis with SUV max equal 15.4. LEFT lower lobe mass measures 3.5 cm (image 32, series 7) with SUV max equal 19.3. A ground-glass nodule in the more superior LEFT lower lobe measures 1.5 cm (image 22, series 7). This does not have clear metabolic activity. ABDOMEN/PELVIS Several low-density lesions in the liver without metabolic activity. There is hypermetabolic activity associated with the thickened LEFT adrenal gland measuring 13 mm with SUV max equal 13.9. No hypermetabolic abdominal pelvic lymph nodes. SKELETON No focal hypermetabolic activity to suggest skeletal metastasis. IMPRESSION: 1. Hypermetabolic LEFT lower lobe mass consists with bronchogenic carcinoma. 2. Ground-glass nodule in the LEFT lower lobe is suspicious but not metabolic. 3. Hypermetabolic ipsilateral and contralateral mediastinal nodal metastasis. 4. Hypermetabolic bilateral supraclavicular nodal metastasis. 5. Hypermetabolic metastasis to the  LEFT adrenal gland. Electronically Signed   By: Suzy Bouchard M.D.   On: 05/22/2016 09:33    ASSESSMENT AND PLAN: This is a very pleasant 75 year old white female with a stage IV non-small cell lung cancer with solitary left adrenal metastasis. She is currently undergoing a course of concurrent chemoradiation with weekly carboplatin and paclitaxel is status post 3 cycles to the disease in the chest and she is expected to have SBRT to the left adrenal metastasis on under the care of Dr. Tammi Klippel. The patient related the first week of her concurrent chemoradiation fairly well with no significant adverse effects. I recommended for the patient to proceed with cycle #4 today as scheduled. She would come back for  follow-up visit in 2 weeks for evaluation before starting cycle #6. The patient was advised to call immediately if she has any concerning symptoms in the interval. The patient voices understanding of current disease status and treatment options and is in agreement with the current care plan.  All questions were answered. The patient knows to call the clinic with any problems, questions or concerns. We can certainly see the patient much sooner if necessary.  Mikey Bussing, DNP, AGPCNP-BC, AOCNP

## 2016-06-15 NOTE — Telephone Encounter (Signed)
No LOS for 06/15/16. Tried calling Erasmo Downer and was unsuccessful!  A copy of the AVS report & appointment schedule was given to patient,per 06/15/16 los.

## 2016-06-16 ENCOUNTER — Ambulatory Visit
Admission: RE | Admit: 2016-06-16 | Discharge: 2016-06-16 | Disposition: A | Discharge status: Home / Self Care | Payer: Medicare Other | Source: Ambulatory Visit | Attending: Radiation Oncology | Admitting: Radiation Oncology

## 2016-06-16 DIAGNOSIS — Z510 Encounter for antineoplastic radiation therapy: Secondary | ICD-10-CM | POA: Diagnosis not present

## 2016-06-17 ENCOUNTER — Ambulatory Visit
Admission: RE | Admit: 2016-06-17 | Discharge: 2016-06-17 | Disposition: A | Discharge status: Home / Self Care | Payer: Medicare Other | Source: Ambulatory Visit | Attending: Radiation Oncology | Admitting: Radiation Oncology

## 2016-06-17 ENCOUNTER — Encounter: Payer: Self-pay | Admitting: Radiation Oncology

## 2016-06-17 VITALS — BP 120/92 | HR 90 | Temp 98.1°F | Resp 16 | Wt 166.8 lb

## 2016-06-17 DIAGNOSIS — Z510 Encounter for antineoplastic radiation therapy: Secondary | ICD-10-CM | POA: Diagnosis not present

## 2016-06-17 DIAGNOSIS — C3491 Malignant neoplasm of unspecified part of right bronchus or lung: Secondary | ICD-10-CM

## 2016-06-17 DIAGNOSIS — C801 Malignant (primary) neoplasm, unspecified: Secondary | ICD-10-CM

## 2016-06-17 DIAGNOSIS — C7972 Secondary malignant neoplasm of left adrenal gland: Secondary | ICD-10-CM

## 2016-06-17 MED ORDER — RADIAPLEXRX EX GEL
Freq: Once | CUTANEOUS | Status: AC
  Administered 2016-06-17: 16:00:00 via TOPICAL

## 2016-06-17 NOTE — Progress Notes (Signed)
  Radiation Oncology         [phone]   Name: Molly Black MRN: [id number]   Date: 06/17/2016  DOB: Mar 18, 1941   Weekly Radiation Therapy Management    ICD-9-CM ICD-10-CM   1. Adenocarcinoma of right lung, stage 4 (HCC) 162.9 C34.91   2. 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  Weight and vitals stable. Denies pain. Nursing notes, hyperpigmentation of right neck and chest noted without desquamation. Reports using radiaplex bid as directed. Reports discomfort associated with swallowing is less with the use of Carafate. Denies hemoptysis. Denies a cough. Reports shortness of breath is less with exertion since starting radiation therapy. Reports fatigue.   Set-up films were reviewed. The chart was checked.  Physical Findings  weight is 166 lb 12.8 oz (75.7 kg). Her oral temperature is 98.1 F (36.7 C). Her blood pressure is 120/92 (abnormal) and her pulse is 90. Her respiration is 16 and oxygen saturation is 95%. . Weight essentially stable. Erythema without desquamation in the treatment field.  Impression The patient is tolerating radiation.    Plan Continue treatment as planned.         Sheral Apley Tammi Klippel, M.D.  This document serves as a record of services personally performed by Tyler Pita, MD. It was created on his behalf by Arlyce Harman, a trained medical scribe. The creation of this record is based on the scribe's personal observations and the provider's statements to them. This document has been checked and approved by the attending provider.

## 2016-06-17 NOTE — Progress Notes (Signed)
Weight and vitals stable. Denies pain. Hyperpigmentation of right neck and chest noted without desquamation. Reports using radiaplex bid as directed. Reports discomfort associated with swallowing is less with the use of Carafate. Denies hemoptysis. Denies a cough. Reports shortness of breath is less with exertion since starting radiation therapy. Reports fatigue.   BP (!) 120/92 (BP Location: Left Arm, Patient Position: Sitting, Cuff Size: Normal)   Pulse 90   Temp 98.1 F (36.7 C) (Oral)   Resp 16   Wt 166 lb 12.8 oz (75.7 kg)   SpO2 95%   BMI 31.52 kg/m  Wt Readings from Last 3 Encounters:  06/17/16 166 lb 12.8 oz (75.7 kg)  06/15/16 168 lb 6.4 oz (76.4 kg)  06/11/16 169 lb 3.2 oz (76.7 kg)

## 2016-06-17 NOTE — Addendum Note (Signed)
Encounter addended by: Heywood Footman, RN on: 06/17/2016  3:38 PM<BR>    Actions taken: Order list changed, Diagnosis association updated, MAR administration accepted, Order Reconciliation Section accessed

## 2016-06-22 ENCOUNTER — Ambulatory Visit
Admission: RE | Admit: 2016-06-22 | Discharge: 2016-06-22 | Disposition: A | Discharge status: Home / Self Care | Payer: Medicare Other | Source: Ambulatory Visit | Attending: Radiation Oncology | Admitting: Radiation Oncology

## 2016-06-22 ENCOUNTER — Other Ambulatory Visit (HOSPITAL_BASED_OUTPATIENT_CLINIC_OR_DEPARTMENT_OTHER): Payer: Medicare Other

## 2016-06-22 ENCOUNTER — Ambulatory Visit (HOSPITAL_BASED_OUTPATIENT_CLINIC_OR_DEPARTMENT_OTHER): Payer: Medicare Other

## 2016-06-22 VITALS — BP 125/80 | HR 81 | Temp 98.1°F | Resp 18

## 2016-06-22 DIAGNOSIS — Z510 Encounter for antineoplastic radiation therapy: Secondary | ICD-10-CM | POA: Diagnosis not present

## 2016-06-22 DIAGNOSIS — C3431 Malignant neoplasm of lower lobe, right bronchus or lung: Secondary | ICD-10-CM | POA: Diagnosis not present

## 2016-06-22 DIAGNOSIS — Z5111 Encounter for antineoplastic chemotherapy: Secondary | ICD-10-CM | POA: Diagnosis not present

## 2016-06-22 DIAGNOSIS — C3492 Malignant neoplasm of unspecified part of left bronchus or lung: Secondary | ICD-10-CM

## 2016-06-22 DIAGNOSIS — C3491 Malignant neoplasm of unspecified part of right bronchus or lung: Secondary | ICD-10-CM

## 2016-06-22 LAB — COMPREHENSIVE METABOLIC PANEL
ALBUMIN: 3.3 g/dL — AB (ref 3.5–5.0)
ALK PHOS: 35 U/L — AB (ref 40–150)
ALT: 23 U/L (ref 0–55)
ANION GAP: 8 meq/L (ref 3–11)
AST: 19 U/L (ref 5–34)
BUN: 17.8 mg/dL (ref 7.0–26.0)
CALCIUM: 9.7 mg/dL (ref 8.4–10.4)
CHLORIDE: 109 meq/L (ref 98–109)
CO2: 25 mEq/L (ref 22–29)
CREATININE: 0.7 mg/dL (ref 0.6–1.1)
EGFR: 87 mL/min/{1.73_m2} — ABNORMAL LOW (ref >90)
Glucose: 101 mg/dl (ref 70–140)
POTASSIUM: 3.8 meq/L (ref 3.5–5.1)
Sodium: 142 mEq/L (ref 136–145)
Total Bilirubin: 1.29 mg/dL — ABNORMAL HIGH (ref 0.20–1.20)
Total Protein: 6.4 g/dL (ref 6.4–8.3)

## 2016-06-22 LAB — CBC WITH DIFFERENTIAL/PLATELET
BASO%: 0.8 % (ref 0.0–2.0)
BASOS ABS: 0 10*3/uL (ref 0.0–0.1)
EOS ABS: 0.1 10*3/uL (ref 0.0–0.5)
EOS%: 1 % (ref 0.0–7.0)
HEMATOCRIT: 37.9 % (ref 34.8–46.6)
HEMOGLOBIN: 13 g/dL (ref 11.6–15.9)
LYMPH#: 0.5 10*3/uL — AB (ref 0.9–3.3)
LYMPH%: 9.2 % — ABNORMAL LOW (ref 14.0–49.7)
MCH: 30.8 pg (ref 25.1–34.0)
MCHC: 34.2 g/dL (ref 31.5–36.0)
MCV: 89.9 fL (ref 79.5–101.0)
MONO#: 0.4 10*3/uL (ref 0.1–0.9)
MONO%: 8.3 % (ref 0.0–14.0)
NEUT#: 4.1 10*3/uL (ref 1.5–6.5)
NEUT%: 80.7 % — AB (ref 38.4–76.8)
Platelets: 146 10*3/uL (ref 145–400)
RBC: 4.22 10*6/uL (ref 3.70–5.45)
RDW: 13.8 % (ref 11.2–14.5)
WBC: 5.1 10*3/uL (ref 3.9–10.3)

## 2016-06-22 MED ORDER — PACLITAXEL CHEMO INJECTION 300 MG/50ML
45.0000 mg/m2 | Freq: Once | INTRAVENOUS | Status: AC
  Administered 2016-06-22: 84 mg via INTRAVENOUS
  Filled 2016-06-22: qty 14

## 2016-06-22 MED ORDER — FAMOTIDINE IN NACL 20-0.9 MG/50ML-% IV SOLN
INTRAVENOUS | Status: AC
  Filled 2016-06-22: qty 50

## 2016-06-22 MED ORDER — DIPHENHYDRAMINE HCL 50 MG/ML IJ SOLN
INTRAMUSCULAR | Status: AC
  Filled 2016-06-22: qty 1

## 2016-06-22 MED ORDER — DIPHENHYDRAMINE HCL 50 MG/ML IJ SOLN
50.0000 mg | Freq: Once | INTRAMUSCULAR | Status: AC
  Administered 2016-06-22: 50 mg via INTRAVENOUS

## 2016-06-22 MED ORDER — FAMOTIDINE IN NACL 20-0.9 MG/50ML-% IV SOLN
20.0000 mg | Freq: Once | INTRAVENOUS | Status: AC
Start: 2016-06-22 — End: 2016-06-22
  Administered 2016-06-22: 20 mg via INTRAVENOUS

## 2016-06-22 MED ORDER — PALONOSETRON HCL INJECTION 0.25 MG/5ML
0.2500 mg | Freq: Once | INTRAVENOUS | Status: AC
  Administered 2016-06-22: 0.25 mg via INTRAVENOUS

## 2016-06-22 MED ORDER — SODIUM CHLORIDE 0.9 % IV SOLN
Freq: Once | INTRAVENOUS | Status: AC
  Administered 2016-06-22: 12:00:00 via INTRAVENOUS

## 2016-06-22 MED ORDER — SODIUM CHLORIDE 0.9 % IV SOLN
20.0000 mg | Freq: Once | INTRAVENOUS | Status: AC
  Administered 2016-06-22: 20 mg via INTRAVENOUS
  Filled 2016-06-22: qty 2

## 2016-06-22 MED ORDER — SODIUM CHLORIDE 0.9 % IV SOLN
172.4000 mg | Freq: Once | INTRAVENOUS | Status: AC
  Administered 2016-06-22: 170 mg via INTRAVENOUS
  Filled 2016-06-22: qty 17

## 2016-06-22 MED ORDER — PALONOSETRON HCL INJECTION 0.25 MG/5ML
INTRAVENOUS | Status: AC
  Filled 2016-06-22: qty 5

## 2016-06-22 NOTE — Patient Instructions (Signed)
[facility] Discharge Instructions for Patients Receiving Chemotherapy  Today you received the following chemotherapy agents :  Taxol,  Carboplatin.  To help prevent nausea and vomiting after your treatment, we encourage you to take your nausea medication as prescribed.   If you develop nausea and vomiting that is not controlled by your nausea medication, call the clinic.   BELOW ARE SYMPTOMS THAT SHOULD BE REPORTED IMMEDIATELY:  *FEVER GREATER THAN 100.5 F  *CHILLS WITH OR WITHOUT FEVER  NAUSEA AND VOMITING THAT IS NOT CONTROLLED WITH YOUR NAUSEA MEDICATION  *UNUSUAL SHORTNESS OF BREATH  *UNUSUAL BRUISING OR BLEEDING  TENDERNESS IN MOUTH AND THROAT WITH OR WITHOUT PRESENCE OF ULCERS  *URINARY PROBLEMS  *BOWEL PROBLEMS  UNUSUAL RASH Items with * indicate a potential emergency and should be followed up as soon as possible.  Feel free to call the clinic you have any questions or concerns. The clinic phone number is [phone].  Please show the CHEMO ALERT CARD at check-in to the Emergency Department and triage nurse.   

## 2016-06-23 ENCOUNTER — Ambulatory Visit
Admission: RE | Admit: 2016-06-23 | Discharge: 2016-06-23 | Disposition: A | Discharge status: Home / Self Care | Payer: Medicare Other | Source: Ambulatory Visit | Attending: Radiation Oncology | Admitting: Radiation Oncology

## 2016-06-23 DIAGNOSIS — Z510 Encounter for antineoplastic radiation therapy: Secondary | ICD-10-CM | POA: Diagnosis not present

## 2016-06-24 ENCOUNTER — Ambulatory Visit
Admission: RE | Admit: 2016-06-24 | Discharge: 2016-06-24 | Disposition: A | Discharge status: Home / Self Care | Payer: Medicare Other | Source: Ambulatory Visit | Attending: Radiation Oncology | Admitting: Radiation Oncology

## 2016-06-24 DIAGNOSIS — Z510 Encounter for antineoplastic radiation therapy: Secondary | ICD-10-CM | POA: Diagnosis not present

## 2016-06-25 ENCOUNTER — Ambulatory Visit
Admission: RE | Admit: 2016-06-25 | Discharge: 2016-06-25 | Disposition: A | Discharge status: Home / Self Care | Payer: Medicare Other | Source: Ambulatory Visit | Attending: Radiation Oncology | Admitting: Radiation Oncology

## 2016-06-25 DIAGNOSIS — Z510 Encounter for antineoplastic radiation therapy: Secondary | ICD-10-CM | POA: Diagnosis not present

## 2016-06-26 ENCOUNTER — Ambulatory Visit
Admission: RE | Admit: 2016-06-26 | Discharge: 2016-06-26 | Disposition: A | Discharge status: Home / Self Care | Payer: Medicare Other | Source: Ambulatory Visit | Attending: Radiation Oncology | Admitting: Radiation Oncology

## 2016-06-26 ENCOUNTER — Encounter: Payer: Self-pay | Admitting: Radiation Oncology

## 2016-06-26 VITALS — BP 126/91 | HR 104 | Resp 18 | Wt 167.0 lb

## 2016-06-26 DIAGNOSIS — Z171 Estrogen receptor negative status [ER-]: Principal | ICD-10-CM

## 2016-06-26 DIAGNOSIS — Z510 Encounter for antineoplastic radiation therapy: Secondary | ICD-10-CM | POA: Diagnosis not present

## 2016-06-26 DIAGNOSIS — C7972 Secondary malignant neoplasm of left adrenal gland: Secondary | ICD-10-CM

## 2016-06-26 DIAGNOSIS — C50911 Malignant neoplasm of unspecified site of right female breast: Secondary | ICD-10-CM

## 2016-06-26 DIAGNOSIS — C801 Malignant (primary) neoplasm, unspecified: Principal | ICD-10-CM

## 2016-06-26 MED ORDER — SONAFINE EX EMUL
1.0000 "application " | Freq: Two times a day (BID) | CUTANEOUS | Status: DC
  Administered 2016-06-26: 1 via TOPICAL

## 2016-06-26 NOTE — Progress Notes (Addendum)
Weight and vitals stable. Reports intermittent posterior neck pain. Hyperpigmentation of right neck and chest noted without desquamation. Provided patient with Sonafine to use instead of radiaplex . Reports managing discomfort with swallowing is well managed with carafate. Denies hemoptysis or cough. Reports SOB with exertion is less. Reports moderate fatigue.  BP (!) 126/91 (BP Location: Left Arm, Patient Position: Sitting, Cuff Size: Normal)   Pulse (!) 104   Resp 18   Wt 167 lb (75.8 kg)   SpO2 97%   BMI 31.55 kg/m  Wt Readings from Last 3 Encounters:  06/26/16 167 lb (75.8 kg)  06/17/16 166 lb 12.8 oz (75.7 kg)  06/15/16 168 lb 6.4 oz (76.4 kg)

## 2016-06-26 NOTE — Progress Notes (Signed)
  Radiation Oncology         [phone]   Name: Molly Black MRN: [id number]   Date: 06/26/2016  DOB: 09-Oct-1940   Weekly Radiation Therapy Management    ICD-9-CM ICD-10-CM   1. Carcinoma of breast, stage 2, estrogen receptor negative, right (HCC) 174.9 C50.911    V86.1 Z17.1     Current Dose: 46 Gy Chest, 30 Gy Adrenal Planned Dose:  60 Gy Chest, 50 Gy Adrenal  Narrative The patient presents for routine under treatment assessment.  Weight and vitals stable. Reports intermittent posterior neck pain. Hyperpigmentation of right neck and chest noted without desquamation. Provided patient with Sonafine to use instead of radiaplex . Reports discomfort with swallowing is well managed with carafate. Denies hemoptysis or cough. Reports SOB with exertion is less. Reports moderate fatigue.  Set-up films were reviewed. The chart was checked.  Physical Findings Vitals:   06/26/16 1413  BP: (!) 126/91  Pulse: (!) 104  Resp: 18  SpO2: 97%  Weight: 167 lb (75.8 kg)    Weight essentially stable. Erythema without desquamation in the treatment field.  Impression The patient is tolerating radiation.    Plan Continue treatment as planned. The patient was provided with Sonafine.         Sheral Apley Tammi Klippel, M.D.  This document serves as a record of services personally performed by Tyler Pita, MD. It was created on his behalf by Darcus Austin, a trained medical scribe. The creation of this record is based on the scribe's personal observations and the provider's statements to them. This document has been checked and approved by the attending provider.

## 2016-06-29 ENCOUNTER — Ambulatory Visit (HOSPITAL_BASED_OUTPATIENT_CLINIC_OR_DEPARTMENT_OTHER): Payer: Medicare Other | Admitting: Internal Medicine

## 2016-06-29 ENCOUNTER — Other Ambulatory Visit (HOSPITAL_BASED_OUTPATIENT_CLINIC_OR_DEPARTMENT_OTHER): Payer: Medicare Other

## 2016-06-29 ENCOUNTER — Ambulatory Visit: Payer: Medicare Other | Admitting: Radiation Oncology

## 2016-06-29 ENCOUNTER — Ambulatory Visit
Admission: RE | Admit: 2016-06-29 | Discharge: 2016-06-29 | Disposition: A | Discharge status: Home / Self Care | Payer: Medicare Other | Source: Ambulatory Visit | Attending: Radiation Oncology | Admitting: Radiation Oncology

## 2016-06-29 ENCOUNTER — Encounter: Payer: Self-pay | Admitting: Internal Medicine

## 2016-06-29 ENCOUNTER — Telehealth: Payer: Self-pay | Admitting: Internal Medicine

## 2016-06-29 ENCOUNTER — Ambulatory Visit (HOSPITAL_BASED_OUTPATIENT_CLINIC_OR_DEPARTMENT_OTHER): Payer: Medicare Other

## 2016-06-29 VITALS — BP 119/88 | HR 91 | Temp 98.3°F | Resp 18

## 2016-06-29 DIAGNOSIS — C801 Malignant (primary) neoplasm, unspecified: Principal | ICD-10-CM

## 2016-06-29 DIAGNOSIS — R1319 Other dysphagia: Secondary | ICD-10-CM | POA: Diagnosis not present

## 2016-06-29 DIAGNOSIS — L598 Other specified disorders of the skin and subcutaneous tissue related to radiation: Secondary | ICD-10-CM

## 2016-06-29 DIAGNOSIS — C3432 Malignant neoplasm of lower lobe, left bronchus or lung: Secondary | ICD-10-CM | POA: Diagnosis not present

## 2016-06-29 DIAGNOSIS — C7972 Secondary malignant neoplasm of left adrenal gland: Secondary | ICD-10-CM

## 2016-06-29 DIAGNOSIS — Z510 Encounter for antineoplastic radiation therapy: Secondary | ICD-10-CM | POA: Diagnosis not present

## 2016-06-29 DIAGNOSIS — Z5111 Encounter for antineoplastic chemotherapy: Secondary | ICD-10-CM

## 2016-06-29 DIAGNOSIS — R1310 Dysphagia, unspecified: Secondary | ICD-10-CM | POA: Diagnosis not present

## 2016-06-29 DIAGNOSIS — C3492 Malignant neoplasm of unspecified part of left bronchus or lung: Secondary | ICD-10-CM

## 2016-06-29 DIAGNOSIS — C3491 Malignant neoplasm of unspecified part of right bronchus or lung: Secondary | ICD-10-CM

## 2016-06-29 DIAGNOSIS — T300 Burn of unspecified body region, unspecified degree: Secondary | ICD-10-CM | POA: Insufficient documentation

## 2016-06-29 HISTORY — DX: Burn of unspecified body region, unspecified degree: T30.0

## 2016-06-29 HISTORY — DX: Dysphagia, unspecified: R13.10

## 2016-06-29 LAB — CBC WITH DIFFERENTIAL/PLATELET
BASO%: 0.5 % (ref 0.0–2.0)
Basophils Absolute: 0 10*3/uL (ref 0.0–0.1)
EOS ABS: 0.1 10*3/uL (ref 0.0–0.5)
EOS%: 1.9 % (ref 0.0–7.0)
HEMATOCRIT: 34.8 % (ref 34.8–46.6)
HGB: 12.2 g/dL (ref 11.6–15.9)
LYMPH#: 0.4 10*3/uL — AB (ref 0.9–3.3)
LYMPH%: 9.3 % — AB (ref 14.0–49.7)
MCH: 31.1 pg (ref 25.1–34.0)
MCHC: 35.1 g/dL (ref 31.5–36.0)
MCV: 88.8 fL (ref 79.5–101.0)
MONO#: 0.3 10*3/uL (ref 0.1–0.9)
MONO%: 8.3 % (ref 0.0–14.0)
NEUT#: 3 10*3/uL (ref 1.5–6.5)
NEUT%: 80 % — AB (ref 38.4–76.8)
PLATELETS: 140 10*3/uL — AB (ref 145–400)
RBC: 3.92 10*6/uL (ref 3.70–5.45)
RDW: 15.4 % — ABNORMAL HIGH (ref 11.2–14.5)
WBC: 3.8 10*3/uL — ABNORMAL LOW (ref 3.9–10.3)

## 2016-06-29 LAB — COMPREHENSIVE METABOLIC PANEL
ALT: 20 U/L (ref 0–55)
ANION GAP: 9 meq/L (ref 3–11)
AST: 18 U/L (ref 5–34)
Albumin: 3.3 g/dL — ABNORMAL LOW (ref 3.5–5.0)
Alkaline Phosphatase: 36 U/L — ABNORMAL LOW (ref 40–150)
BILIRUBIN TOTAL: 1.23 mg/dL — AB (ref 0.20–1.20)
BUN: 12.8 mg/dL (ref 7.0–26.0)
CALCIUM: 9.7 mg/dL (ref 8.4–10.4)
CHLORIDE: 108 meq/L (ref 98–109)
CO2: 23 meq/L (ref 22–29)
Creatinine: 0.7 mg/dL (ref 0.6–1.1)
EGFR: 88 mL/min/{1.73_m2} — AB (ref >90)
Glucose: 118 mg/dl (ref 70–140)
Potassium: 3.6 mEq/L (ref 3.5–5.1)
Sodium: 140 mEq/L (ref 136–145)
TOTAL PROTEIN: 6.4 g/dL (ref 6.4–8.3)

## 2016-06-29 MED ORDER — SODIUM CHLORIDE 0.9 % IV SOLN
45.0000 mg/m2 | Freq: Once | INTRAVENOUS | Status: AC
  Administered 2016-06-29: 84 mg via INTRAVENOUS
  Filled 2016-06-29: qty 14

## 2016-06-29 MED ORDER — SODIUM CHLORIDE 0.9 % IV SOLN
20.0000 mg | Freq: Once | INTRAVENOUS | Status: AC
  Administered 2016-06-29: 20 mg via INTRAVENOUS
  Filled 2016-06-29: qty 2

## 2016-06-29 MED ORDER — DEXAMETHASONE SODIUM PHOSPHATE 10 MG/ML IJ SOLN
INTRAMUSCULAR | Status: AC
  Filled 2016-06-29: qty 1

## 2016-06-29 MED ORDER — PALONOSETRON HCL INJECTION 0.25 MG/5ML
0.2500 mg | Freq: Once | INTRAVENOUS | Status: AC
  Administered 2016-06-29: 0.25 mg via INTRAVENOUS

## 2016-06-29 MED ORDER — DIPHENHYDRAMINE HCL 50 MG/ML IJ SOLN
50.0000 mg | Freq: Once | INTRAMUSCULAR | Status: AC
  Administered 2016-06-29: 50 mg via INTRAVENOUS

## 2016-06-29 MED ORDER — FAMOTIDINE IN NACL 20-0.9 MG/50ML-% IV SOLN
INTRAVENOUS | Status: AC
  Filled 2016-06-29: qty 50

## 2016-06-29 MED ORDER — SODIUM CHLORIDE 0.9 % IV SOLN
Freq: Once | INTRAVENOUS | Status: AC
Start: 2016-06-29 — End: 2016-06-29
  Administered 2016-06-29: 14:00:00 via INTRAVENOUS

## 2016-06-29 MED ORDER — FAMOTIDINE IN NACL 20-0.9 MG/50ML-% IV SOLN
20.0000 mg | Freq: Once | INTRAVENOUS | Status: AC
  Administered 2016-06-29: 20 mg via INTRAVENOUS

## 2016-06-29 MED ORDER — PALONOSETRON HCL INJECTION 0.25 MG/5ML
INTRAVENOUS | Status: AC
  Filled 2016-06-29: qty 5

## 2016-06-29 MED ORDER — DIPHENHYDRAMINE HCL 50 MG/ML IJ SOLN
INTRAMUSCULAR | Status: AC
  Filled 2016-06-29: qty 1

## 2016-06-29 MED ORDER — SODIUM CHLORIDE 0.9 % IV SOLN
172.4000 mg | Freq: Once | INTRAVENOUS | Status: AC
  Administered 2016-06-29: 170 mg via INTRAVENOUS
  Filled 2016-06-29: qty 17

## 2016-06-29 NOTE — Telephone Encounter (Signed)
Appointments scheduled per 06/29/16 los. A copy of the AVS report and appointment schedule was given to the patient, per 06/29/16 los.

## 2016-06-29 NOTE — Patient Instructions (Signed)
[facility] Discharge Instructions for Patients Receiving Chemotherapy  Today you received the following chemotherapy agents taxol/carboplatin  To help prevent nausea and vomiting after your treatment, we encourage you to take your nausea medication as directed   If you develop nausea and vomiting that is not controlled by your nausea medication, call the clinic.   BELOW ARE SYMPTOMS THAT SHOULD BE REPORTED IMMEDIATELY:  *FEVER GREATER THAN 100.5 F  *CHILLS WITH OR WITHOUT FEVER  NAUSEA AND VOMITING THAT IS NOT CONTROLLED WITH YOUR NAUSEA MEDICATION  *UNUSUAL SHORTNESS OF BREATH  *UNUSUAL BRUISING OR BLEEDING  TENDERNESS IN MOUTH AND THROAT WITH OR WITHOUT PRESENCE OF ULCERS  *URINARY PROBLEMS  *BOWEL PROBLEMS  UNUSUAL RASH Items with * indicate a potential emergency and should be followed up as soon as possible.  Feel free to call the clinic you have any questions or concerns. The clinic phone number is [phone].  

## 2016-06-29 NOTE — Progress Notes (Signed)
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 BRAF G469S CDKN2A p16INK4a deletion exons 1-2 and p14ARF deletion exon 2 KMT2C (MLL3) Y1749* SW96 splice site 759F>M Additional Findings? Microsatellite status MS-Stable Tumor Mutation Burden TMB-Intermediate; 12 Muts/Mb Additional Disease-relevant Genes with No Reportable Alterations Identified? EGFR KRAS ALK MET RET ERBB2 ROS1  PRIOR THERAPY: None  CURRENT THERAPY: 1) concurrent chemoradiation with weekly carboplatin for AUC of 2 and paclitaxel 45 MG/M2 status post 5 cycles. 2) stereotactic radiotherapy to the left adrenal gland metastasis.  INTERVAL HISTORY: Molly Black 75 y.o. female returns to the clinic today for follow-up visit accompanied by her daughter. The patient was seen at the chemotherapy again today. She is tolerating her treatment with concurrent chemoradiation well except for skin burn from radiation in the right neck area. She currently applied Radiaplex ex to this area. She also has odynophagia secondary to radiation induced esophagitis. She is currently on Carafate and pain medication. She denied having any significant weight loss or night sweats. She has no nausea or vomiting. She denied having any fever or chills. She is here today for evaluation before starting cycle #6. She is expected to complete the concurrent radiotherapy on 07/07/2016.  MEDICAL HISTORY: Past Medical History:  Diagnosis Date  . Adenocarcinoma of right lung, stage 4 ([facility]) Jun 09, 2016  . Anxiety    with death of brother  none since  . Carcinoma of breast, stage 2, estrogen receptor negative, right ([facility])    T2N0 right breast mastectomy/TRAM reconstruction ER negative PR positive  June 1989  Then [facility] chemo  . Cystadenofibroma of ovary, unspecified laterality   . Diverticulosis of colon without diverticulitis   . Encounter for antineoplastic chemotherapy 06/01/2016  . Gastric ulcer   . GERD (gastroesophageal reflux disease)    takes Nexium daily  . H/O hiatal hernia   . Hemangioma of liver   . Hiatal hernia   . Neuropathy, peripheral ([facility])   . OSA on CPAP    setting 15- uses occ.  . Pneumonia    at age 46 years old  . PONV (postoperative nausea and vomiting)   . Schatzki's ring   . Shortness of breath   . Skin cancer of face    "had some places frozen off" (04/13/2013)  . Tubular adenoma of colon     ALLERGIES:  has No Known Allergies.  MEDICATIONS:  Current Outpatient Prescriptions  Medication Sig Dispense Refill  . ALPRAZolam (XANAX) 0.5 MG tablet     . aspirin 81 MG tablet Take 81 mg by mouth daily.    . beta carotene w/minerals (OCUVITE) tablet Take 1 tablet by mouth daily.    . Calcium Carbonate-Vitamin D (CALCIUM 600+D PO) Take by mouth 2 (two) times daily.    . cephALEXin (KEFLEX) 500 MG capsule Take 500 mg by mouth.     . Cholecalciferol (VITAMIN D3) 3000 UNITS TABS Take 1 tablet by mouth daily.    . Cyanocobalamin 2500 MCG TABS Take 1 tablet by mouth daily.    . feeding supplement (ENSURE CLINICAL STRENGTH) LIQD Take 237 mLs by mouth AC breakfast. Takes occasionally    .  metroNIDAZOLE (FLAGYL) 500 MG tablet Take 1 tablet (500 mg total) by mouth 3 (three) times daily. 21 tablet 0  . Multiple Vitamin (MULTIVITAMIN WITH MINERALS) TABS tablet Take 1 tablet by mouth daily.    . Omega-3 Fatty Acids (FISH OIL) 1000 MG CAPS Take 2,000 mg by mouth daily.    Marland Kitchen omeprazole (PRILOSEC) 40 MG capsule Take 1 capsule (40 mg total) by mouth 2 (two) times daily. 30 minutes before breakfast and 30 minutes  before dinner 180 capsule 1  . Probiotic Product (SUPER PROBIOTIC PO) Take 1 tablet by mouth daily.    . prochlorperazine (COMPAZINE) 10 MG tablet Take 1 tablet (10 mg total) by mouth every 6 (six) hours as needed for nausea or vomiting. 30 tablet 0  . sucralfate (CARAFATE) 1 g tablet Take 1 tablet (1 g total) by mouth 4 (four) times daily. 120 tablet 3  . Wound Cleansers (RADIAPLEX EX) Apply topically.     No current facility-administered medications for this visit.    Facility-Administered Medications Ordered in Other Visits  Medication Dose Route Frequency Provider Last Rate Last Dose  . CARBOplatin (PARAPLATIN) 170 mg in sodium chloride 0.9 % 250 mL chemo infusion  170 mg Intravenous Once Curt Bears, MD   170 mg at 06/29/16 1605    SURGICAL HISTORY:  Past Surgical History:  Procedure Laterality Date  . ABDOMINAL HYSTERECTOMY  1989  . ANKLE FRACTURE SURGERY Right ?1996  . BREAST BIOPSY Right 1963; 1981; 1989  . BREAST CAPSULECTOMY WITH IMPLANT EXCHANGE Right 0/03/3817   "silicon gel implant" (2/99/3716)  . BREAST IMPLANT REMOVAL Right 04/13/2013   Procedure: REMOVAL RIGHT RUPTURED BREAST IMPLANTS, DELAYED BREAST RECONSTRUCTION WITH SILICONE GEL IMPLANTS;  Surgeon: Crissie Reese, MD;  Location: [facility];  Service: Plastics;  Laterality: Right;  . BREAST LUMPECTOMY Right 1963; 1981; 1989   "benign; benign; malignant"  . BREAST RECONSTRUCTION Right   . BREAST RECONSTRUCTION WITH PLACEMENT OF TISSUE EXPANDER AND FLEX HD (ACELLULAR HYDRATED DERMIS) Right 1989  . CAPSULECTOMY Right 04/13/2013   Procedure: CAPSULECTOMY;  Surgeon: Crissie Reese, MD;  Location: [facility];  Service: Plastics;  Laterality: Right;  . CATARACT EXTRACTION W/PHACO Right 10/18/2012   Procedure: CATARACT EXTRACTION PHACO AND INTRAOCULAR LENS PLACEMENT (IOC);  Surgeon: Elta Guadeloupe T. Gershon Crane, MD;  Location: [facility];  Service: Ophthalmology;  Laterality: Right;  CDE:7.67  . CATARACT EXTRACTION W/PHACO Left 11/01/2012   Procedure:  CATARACT EXTRACTION PHACO AND INTRAOCULAR LENS PLACEMENT (IOC);  Surgeon: Elta Guadeloupe T. Gershon Crane, MD;  Location: [facility];  Service: Ophthalmology;  Laterality: Left;  CDE:9.92  . CHOLECYSTECTOMY  1990's  . ESOPHAGOGASTRODUODENOSCOPY N/A 10/13/2013   Procedure: ESOPHAGOGASTRODUODENOSCOPY (EGD);  Surgeon: Jerene Bears, MD;  Location: [facility];  Service: Gastroenterology;  Laterality: N/A;  . MASTECTOMY COMPLETE / SIMPLE W/ SENTINEL NODE BIOPSY Right 1989  . RECONSTRUCTION / CORRECTION OF NIPPLE / AEROLA Right 1989  . WRIST FRACTURE SURGERY Right ~ 2007   "put a pin in it" (04/13/2013)    REVIEW OF SYSTEMS:  Constitutional: positive for fatigue Eyes: negative Ears, nose, mouth, throat, and face: negative for hoarseness, sore mouth and sore throat Respiratory: negative for dyspnea on exertion, hemoptysis and pneumonia Cardiovascular: negative for orthopnea, palpitations and syncope Gastrointestinal: positive for odynophagia Genitourinary:negative for dysuria, frequency and hematuria Integument/breast: positive for dryness, skin lesion(s) and Skin burn in the right neck area from radiation Hematologic/lymphatic: negative Musculoskeletal:negative Neurological: negative Behavioral/Psych: negative Endocrine: negative Allergic/Immunologic: negative   PHYSICAL EXAMINATION: General appearance: alert, cooperative, fatigued and no  distress Head: Normocephalic, without obvious abnormality, atraumatic Neck: no adenopathy, no JVD, supple, symmetrical, trachea midline, thyroid not enlarged, symmetric, no tenderness/mass/nodules and Large area of radiation-induced skin burn in the right neck area Lymph nodes: Cervical, supraclavicular, and axillary nodes normal. Resp: clear to auscultation bilaterally Back: symmetric, no curvature. ROM normal. No CVA tenderness. Cardio: regular rate and rhythm, S1, S2 normal, no murmur, click, rub or gallop GI: soft, non-tender; bowel sounds normal; no masses,  no  organomegaly Extremities: extremities normal, atraumatic, no cyanosis or edema Neurologic: Alert and oriented X 3, normal strength and tone. Normal symmetric reflexes. Normal coordination and gait  ECOG PERFORMANCE STATUS: 1 - Symptomatic but completely ambulatory  There were no vitals taken for this visit.  LABORATORY DATA: Lab Results  Component Value Date   WBC 3.8 (L) 06/29/2016   HGB 12.2 06/29/2016   HCT 34.8 06/29/2016   MCV 88.8 06/29/2016   PLT 140 (L) 06/29/2016      Chemistry      Component Value Date/Time   NA 140 06/29/2016 1229   K 3.6 06/29/2016 1229   CL 102 04/05/2013 1154   CO2 23 06/29/2016 1229   BUN 12.8 06/29/2016 1229   CREATININE 0.7 06/29/2016 1229      Component Value Date/Time   CALCIUM 9.7 06/29/2016 1229   ALKPHOS 36 (L) 06/29/2016 1229   AST 18 06/29/2016 1229   ALT 20 06/29/2016 1229   BILITOT 1.23 (H) 06/29/2016 1229       RADIOGRAPHIC STUDIES: No results found.  ASSESSMENT AND PLAN: This is a very pleasant 75 years old white female with: 1) Stage IV non-small cell lung cancer presented with right lower lung mass in addition to mediastinal and bilateral supraclavicular lymphadenopathy as well as metastatic lesion to the left adrenal gland. She is currently undergoing a course of concurrent chemoradiation with weekly carboplatin and paclitaxel is status post 5 cycles. She is also receiving stereotactic radiotherapy to the left adrenal metastasis. She is expected to complete the radiotherapy next week. She is tolerating the treatment well except for odynophagia and skin burn I recommended for the patient to proceed with cycle #6 today as a scheduled. I will repeat cycle #7 that's was a scheduled for next week as the patient has only 2 fractions of radiotherapy that week. I will arrange for her to come back for follow-up visit in 5 weeks for reevaluation with repeat CT scan of the chest, abdomen and pelvis for restaging of her disease. 2)  radiation induced esophagitis and odynophagia: I recommended for the patient to continue on Carafate and pain medication. I would consider her for Viscous Lidocaine if it's not improving. 3) radiation-induced skin burn: She is currently applying Radiaplex EX. She was advised to discuss with Dr. Tammi Klippel modification of the radiotherapy plan if needed. The patient was advised to call immediately if she has any concerning symptoms in the interval. The patient voices understanding of current disease status and treatment options and is in agreement with the current care plan.  All questions were answered. The patient knows to call the clinic with any problems, questions or concerns. We can certainly see the patient much sooner if necessary.  Disclaimer: This note was dictated with voice recognition software. Similar sounding words can inadvertently be transcribed and may not be corrected upon review.

## 2016-06-29 NOTE — Telephone Encounter (Signed)
07/06/16 tx appointments was cancelled, per 06/29/16 los.

## 2016-06-30 ENCOUNTER — Ambulatory Visit
Admission: RE | Admit: 2016-06-30 | Discharge: 2016-06-30 | Disposition: A | Discharge status: Home / Self Care | Payer: Medicare Other | Source: Ambulatory Visit | Attending: Radiation Oncology | Admitting: Radiation Oncology

## 2016-06-30 DIAGNOSIS — Z510 Encounter for antineoplastic radiation therapy: Secondary | ICD-10-CM | POA: Diagnosis not present

## 2016-07-01 ENCOUNTER — Ambulatory Visit
Admission: RE | Admit: 2016-07-01 | Discharge: 2016-07-01 | Disposition: A | Discharge status: Home / Self Care | Payer: Medicare Other | Source: Ambulatory Visit | Attending: Radiation Oncology | Admitting: Radiation Oncology

## 2016-07-01 ENCOUNTER — Ambulatory Visit: Payer: Medicare Other | Admitting: Radiation Oncology

## 2016-07-01 DIAGNOSIS — Z510 Encounter for antineoplastic radiation therapy: Secondary | ICD-10-CM | POA: Diagnosis not present

## 2016-07-02 ENCOUNTER — Ambulatory Visit
Admission: RE | Admit: 2016-07-02 | Discharge: 2016-07-02 | Disposition: A | Discharge status: Home / Self Care | Payer: Medicare Other | Source: Ambulatory Visit | Attending: Radiation Oncology | Admitting: Radiation Oncology

## 2016-07-02 DIAGNOSIS — Z510 Encounter for antineoplastic radiation therapy: Secondary | ICD-10-CM | POA: Diagnosis not present

## 2016-07-03 ENCOUNTER — Ambulatory Visit
Admission: RE | Admit: 2016-07-03 | Discharge: 2016-07-03 | Disposition: A | Discharge status: Home / Self Care | Payer: Medicare Other | Source: Ambulatory Visit | Attending: Radiation Oncology | Admitting: Radiation Oncology

## 2016-07-03 ENCOUNTER — Encounter: Payer: Self-pay | Admitting: Radiation Oncology

## 2016-07-03 VITALS — BP 130/88 | HR 94 | Resp 18 | Wt 167.8 lb

## 2016-07-03 DIAGNOSIS — Z171 Estrogen receptor negative status [ER-]: Principal | ICD-10-CM

## 2016-07-03 DIAGNOSIS — C50911 Malignant neoplasm of unspecified site of right female breast: Secondary | ICD-10-CM

## 2016-07-03 DIAGNOSIS — Z510 Encounter for antineoplastic radiation therapy: Secondary | ICD-10-CM | POA: Diagnosis not present

## 2016-07-03 DIAGNOSIS — C3491 Malignant neoplasm of unspecified part of right bronchus or lung: Secondary | ICD-10-CM

## 2016-07-03 NOTE — Progress Notes (Addendum)
Weight and vitals stable. Denies pain. Hyperpigmentation of right neck and upper back noted with dry desquamation. Reports using Sonafine as directed.  Reports managing discomfort with swallowing is well managed with carafate. Denies hemoptysis or cough. Reports SOB with exertion has greatly improved. Reports moderate fatigue. One month follow up appointment card given.   BP 130/88 (BP Location: Left Arm, Patient Position: Sitting, Cuff Size: Normal)   Pulse 94   Resp 18   Wt 167 lb 12.8 oz (76.1 kg)   SpO2 93%   BMI 31.71 kg/m  Wt Readings from Last 3 Encounters:  07/03/16 167 lb 12.8 oz (76.1 kg)  06/26/16 167 lb (75.8 kg)  06/26/16 167 lb (75.8 kg)

## 2016-07-03 NOTE — Progress Notes (Signed)
  Radiation Oncology         [phone]   Name: Molly Black MRN: [id number]   Date: 07/03/2016  DOB: May 06, 1941   Weekly Radiation Therapy Management    ICD-9-CM ICD-10-CM   1. Carcinoma of breast, stage 2, estrogen receptor negative, right (HCC) 174.9 C50.911    V86.1 Z17.1   2. Adenocarcinoma of right lung, stage 4 (HCC) 162.9 C34.91     Current Dose: 56 Gy Chest, 50 Gy Adrenal Planned Dose:  60 Gy Chest, 50 Gy Adrenal  Narrative The patient presents for routine under treatment assessment.  Denies pain, hemoptysis, or cough. Hyperpigmentation of right neck and upper back noted with dry desquamation by the nurse. The patient reports using Sonafine as directed. Managing discomfort with swallowing with carafate. Reports SOB with exertion has greatly improved and moderate fatigue.  Set-up films were reviewed. The chart was checked.  Physical Findings Vitals:   07/03/16 1534  BP: 130/88  Pulse: 94  Resp: 18  SpO2: 93%  Weight: 167 lb 12.8 oz (76.1 kg)    Weight essentially stable. Lungs are clear to auscultation bilaterally. Heart has regular rate and rhythm. Erythema without desquamation in the treatment field. Right cervical adenopathy has decreased in size on palpation.  Impression The patient is tolerating radiation.    Plan Continue treatment as planned. A one month follow up appointment card was given.         Sheral Apley Tammi Klippel, M.D.  This document serves as a record of services personally performed by Tyler Pita, MD. It was created on his behalf by Darcus Austin, a trained medical scribe. The creation of this record is based on the scribe's personal observations and the provider's statements to them. This document has been checked and approved by the attending provider.

## 2016-07-06 ENCOUNTER — Ambulatory Visit: Payer: Medicare Other

## 2016-07-06 ENCOUNTER — Other Ambulatory Visit: Payer: Medicare Other

## 2016-07-06 ENCOUNTER — Ambulatory Visit
Admission: RE | Admit: 2016-07-06 | Discharge: 2016-07-06 | Disposition: A | Discharge status: Home / Self Care | Payer: Medicare Other | Source: Ambulatory Visit | Attending: Radiation Oncology | Admitting: Radiation Oncology

## 2016-07-06 DIAGNOSIS — Z510 Encounter for antineoplastic radiation therapy: Secondary | ICD-10-CM | POA: Diagnosis not present

## 2016-07-07 ENCOUNTER — Ambulatory Visit: Payer: Medicare Other

## 2016-07-07 ENCOUNTER — Ambulatory Visit
Admission: RE | Admit: 2016-07-07 | Discharge: 2016-07-07 | Disposition: A | Discharge status: Home / Self Care | Payer: Medicare Other | Source: Ambulatory Visit | Attending: Radiation Oncology | Admitting: Radiation Oncology

## 2016-07-07 DIAGNOSIS — Z510 Encounter for antineoplastic radiation therapy: Secondary | ICD-10-CM | POA: Diagnosis not present

## 2016-07-08 ENCOUNTER — Encounter: Payer: Self-pay | Admitting: Radiation Oncology

## 2016-07-08 ENCOUNTER — Ambulatory Visit: Payer: Medicare Other

## 2016-07-08 NOTE — Progress Notes (Signed)
  Radiation Oncology         [phone] ________________________________  Name: Molly Black [facility] MRN: [id number]  Date: 07/08/2016  DOB: May 07, 1941  End of Treatment Note  Diagnosis:   Carcinoma of right breast, Adenocarcinoma of the lower left lobe     Indication for treatment:  Curative, Definitive SBRT       Radiation treatment dates:   05/25/16 - 07/07/16  Site/dose:   The target was treated to 60 Gy in 30 fractions of 2 Gy  SBRT Adrenal : 50 Gy in 5 fractions of 10 Gy  Beams/energy:   The patient was treated using stereotactic body radiotherapy according to a 3D conformal radiotherapy plan.  Volumetric arc fields were employed to deliver 6 MV X-rays.  Image guidance was performed with per fraction cone beam CT prior to treatment under personal MD supervision.  Immobilization was achieved using BodyFix Pillow.  Narrative: The patient tolerated radiation treatment relatively well. She reported experiencing moderate fatigue, with SOB that greatly improved throughout treatment. She had some erythema without desquamation develop in the treatment field, and she reported no problems using Radiaplex to the skin of the treatment field as directed.  Plan: The patient has completed radiation treatment. The patient will return to radiation oncology clinic for routine followup in one month. I advised them to call or return sooner if they have any questions or concerns related to their recovery or treatment. ________________________________  Sheral Apley. Tammi Klippel, M.D.  This document serves as a record of services personally performed by Tyler Pita, MD. It was created on his behalf by Maryla Morrow, a trained medical scribe. The creation of this record is based on the scribe's personal observations and the provider's statements to them. This document has been checked and approved by the attending provider.

## 2016-07-09 ENCOUNTER — Ambulatory Visit: Payer: Medicare Other

## 2016-07-21 ENCOUNTER — Telehealth: Payer: Self-pay | Admitting: *Deleted

## 2016-07-21 ENCOUNTER — Emergency Department (HOSPITAL_COMMUNITY): Payer: Medicare Other

## 2016-07-21 ENCOUNTER — Inpatient Hospital Stay (HOSPITAL_COMMUNITY): Payer: Medicare Other

## 2016-07-21 ENCOUNTER — Encounter (HOSPITAL_COMMUNITY): Payer: Self-pay | Admitting: Emergency Medicine

## 2016-07-21 ENCOUNTER — Inpatient Hospital Stay (HOSPITAL_COMMUNITY)
Admit: 2016-07-21 | Discharge: 2016-07-21 | Disposition: A | Discharge status: Home / Self Care | Payer: Medicare Other | Attending: Internal Medicine | Admitting: Internal Medicine

## 2016-07-21 ENCOUNTER — Inpatient Hospital Stay (HOSPITAL_COMMUNITY)
Admission: EM | Admit: 2016-07-21 | Discharge: 2016-07-24 | DRG: 054 | Disposition: A | Discharge status: Home Health | Payer: Medicare Other | Attending: Internal Medicine | Admitting: Internal Medicine

## 2016-07-21 DIAGNOSIS — C7972 Secondary malignant neoplasm of left adrenal gland: Secondary | ICD-10-CM | POA: Diagnosis present

## 2016-07-21 DIAGNOSIS — Z9011 Acquired absence of right breast and nipple: Secondary | ICD-10-CM

## 2016-07-21 DIAGNOSIS — R591 Generalized enlarged lymph nodes: Secondary | ICD-10-CM | POA: Diagnosis present

## 2016-07-21 DIAGNOSIS — Z87891 Personal history of nicotine dependence: Secondary | ICD-10-CM

## 2016-07-21 DIAGNOSIS — Z7982 Long term (current) use of aspirin: Secondary | ICD-10-CM

## 2016-07-21 DIAGNOSIS — G629 Polyneuropathy, unspecified: Secondary | ICD-10-CM | POA: Diagnosis present

## 2016-07-21 DIAGNOSIS — Z8711 Personal history of peptic ulcer disease: Secondary | ICD-10-CM | POA: Diagnosis not present

## 2016-07-21 DIAGNOSIS — R471 Dysarthria and anarthria: Secondary | ICD-10-CM | POA: Diagnosis present

## 2016-07-21 DIAGNOSIS — T300 Burn of unspecified body region, unspecified degree: Secondary | ICD-10-CM

## 2016-07-21 DIAGNOSIS — D72819 Decreased white blood cell count, unspecified: Secondary | ICD-10-CM | POA: Diagnosis present

## 2016-07-21 DIAGNOSIS — R569 Unspecified convulsions: Secondary | ICD-10-CM

## 2016-07-21 DIAGNOSIS — Z961 Presence of intraocular lens: Secondary | ICD-10-CM | POA: Diagnosis present

## 2016-07-21 DIAGNOSIS — G4733 Obstructive sleep apnea (adult) (pediatric): Secondary | ICD-10-CM

## 2016-07-21 DIAGNOSIS — Z85828 Personal history of other malignant neoplasm of skin: Secondary | ICD-10-CM

## 2016-07-21 DIAGNOSIS — I639 Cerebral infarction, unspecified: Secondary | ICD-10-CM

## 2016-07-21 DIAGNOSIS — G514 Facial myokymia: Secondary | ICD-10-CM | POA: Diagnosis not present

## 2016-07-21 DIAGNOSIS — K210 Gastro-esophageal reflux disease with esophagitis: Secondary | ICD-10-CM | POA: Diagnosis present

## 2016-07-21 DIAGNOSIS — Z9049 Acquired absence of other specified parts of digestive tract: Secondary | ICD-10-CM

## 2016-07-21 DIAGNOSIS — Z853 Personal history of malignant neoplasm of breast: Secondary | ICD-10-CM | POA: Diagnosis not present

## 2016-07-21 DIAGNOSIS — C799 Secondary malignant neoplasm of unspecified site: Secondary | ICD-10-CM

## 2016-07-21 DIAGNOSIS — Y842 Radiological procedure and radiotherapy as the cause of abnormal reaction of the patient, or of later complication, without mention of misadventure at the time of the procedure: Secondary | ICD-10-CM | POA: Diagnosis present

## 2016-07-21 DIAGNOSIS — R1310 Dysphagia, unspecified: Secondary | ICD-10-CM | POA: Diagnosis present

## 2016-07-21 DIAGNOSIS — I63511 Cerebral infarction due to unspecified occlusion or stenosis of right middle cerebral artery: Secondary | ICD-10-CM | POA: Diagnosis not present

## 2016-07-21 DIAGNOSIS — Z9221 Personal history of antineoplastic chemotherapy: Secondary | ICD-10-CM | POA: Diagnosis not present

## 2016-07-21 DIAGNOSIS — Z808 Family history of malignant neoplasm of other organs or systems: Secondary | ICD-10-CM

## 2016-07-21 DIAGNOSIS — K449 Diaphragmatic hernia without obstruction or gangrene: Secondary | ICD-10-CM | POA: Diagnosis present

## 2016-07-21 DIAGNOSIS — Z8249 Family history of ischemic heart disease and other diseases of the circulatory system: Secondary | ICD-10-CM

## 2016-07-21 DIAGNOSIS — C3491 Malignant neoplasm of unspecified part of right bronchus or lung: Secondary | ICD-10-CM | POA: Diagnosis present

## 2016-07-21 DIAGNOSIS — K5730 Diverticulosis of large intestine without perforation or abscess without bleeding: Secondary | ICD-10-CM | POA: Diagnosis present

## 2016-07-21 DIAGNOSIS — C7931 Secondary malignant neoplasm of brain: Secondary | ICD-10-CM | POA: Diagnosis present

## 2016-07-21 DIAGNOSIS — Z823 Family history of stroke: Secondary | ICD-10-CM

## 2016-07-21 DIAGNOSIS — Z79899 Other long term (current) drug therapy: Secondary | ICD-10-CM

## 2016-07-21 DIAGNOSIS — I6789 Other cerebrovascular disease: Secondary | ICD-10-CM | POA: Diagnosis not present

## 2016-07-21 DIAGNOSIS — R29810 Facial weakness: Secondary | ICD-10-CM | POA: Diagnosis present

## 2016-07-21 DIAGNOSIS — D649 Anemia, unspecified: Secondary | ICD-10-CM | POA: Diagnosis present

## 2016-07-21 DIAGNOSIS — G936 Cerebral edema: Secondary | ICD-10-CM | POA: Diagnosis present

## 2016-07-21 DIAGNOSIS — G939 Disorder of brain, unspecified: Secondary | ICD-10-CM

## 2016-07-21 DIAGNOSIS — Z9989 Dependence on other enabling machines and devices: Secondary | ICD-10-CM

## 2016-07-21 LAB — COMPREHENSIVE METABOLIC PANEL
ALK PHOS: 38 U/L (ref 38–126)
ALT: 25 U/L (ref 14–54)
ANION GAP: 8 (ref 5–15)
AST: 27 U/L (ref 15–41)
Albumin: 3.9 g/dL (ref 3.5–5.0)
BILIRUBIN TOTAL: 1.7 mg/dL — AB (ref 0.3–1.2)
BUN: 17 mg/dL (ref 6–20)
CALCIUM: 9.1 mg/dL (ref 8.9–10.3)
CO2: 24 mmol/L (ref 22–32)
Chloride: 107 mmol/L (ref 101–111)
Creatinine, Ser: 0.66 mg/dL (ref 0.44–1.00)
GFR calc non Af Amer: >60 mL/min (ref >60)
Glucose, Bld: 88 mg/dL (ref 65–99)
Potassium: 3.9 mmol/L (ref 3.5–5.1)
Sodium: 139 mmol/L (ref 135–145)
TOTAL PROTEIN: 6.6 g/dL (ref 6.5–8.1)

## 2016-07-21 LAB — CBC
HCT: 33.5 % — ABNORMAL LOW (ref 36.0–46.0)
HEMOGLOBIN: 11.9 g/dL — AB (ref 12.0–15.0)
MCH: 32.5 pg (ref 26.0–34.0)
MCHC: 35.5 g/dL (ref 30.0–36.0)
MCV: 91.5 fL (ref 78.0–100.0)
PLATELETS: 170 10*3/uL (ref 150–400)
RBC: 3.66 MIL/uL — AB (ref 3.87–5.11)
RDW: 18.2 % — ABNORMAL HIGH (ref 11.5–15.5)
WBC: 3.1 10*3/uL — ABNORMAL LOW (ref 4.0–10.5)

## 2016-07-21 LAB — PROTIME-INR
INR: 0.94
PROTHROMBIN TIME: 12.6 s (ref 11.4–15.2)

## 2016-07-21 LAB — APTT: aPTT: 36 seconds (ref 24–36)

## 2016-07-21 LAB — DIFFERENTIAL
Basophils Absolute: 0 10*3/uL (ref 0.0–0.1)
Basophils Relative: 0 %
EOS ABS: 0 10*3/uL (ref 0.0–0.7)
EOS PCT: 1 %
LYMPHS ABS: 0.3 10*3/uL — AB (ref 0.7–4.0)
LYMPHS PCT: 9 %
MONO ABS: 0.5 10*3/uL (ref 0.1–1.0)
Monocytes Relative: 17 %
Neutro Abs: 2.3 10*3/uL (ref 1.7–7.7)
Neutrophils Relative %: 73 %

## 2016-07-21 LAB — I-STAT TROPONIN, ED: TROPONIN I, POC: 0 ng/mL (ref 0.00–0.08)

## 2016-07-21 MED ORDER — ACETAMINOPHEN 650 MG RE SUPP: 650.0000 mg | RECTAL | Status: DC | PRN

## 2016-07-21 MED ORDER — ASPIRIN 300 MG RE SUPP
300.0000 mg | Freq: Every day | RECTAL | Status: DC
  Administered 2016-07-21: 300 mg via RECTAL
  Filled 2016-07-21: qty 1

## 2016-07-21 MED ORDER — ASPIRIN 325 MG PO TABS
325.0000 mg | ORAL_TABLET | Freq: Every day | ORAL | Status: DC
  Administered 2016-07-22 – 2016-07-24 (×3): 325 mg via ORAL
  Filled 2016-07-21 (×4): qty 1

## 2016-07-21 MED ORDER — KCL IN DEXTROSE-NACL 20-5-0.45 MEQ/L-%-% IV SOLN
INTRAVENOUS | Status: DC
  Administered 2016-07-21: 20:00:00 via INTRAVENOUS
  Filled 2016-07-21 (×2): qty 1000

## 2016-07-21 MED ORDER — SENNOSIDES-DOCUSATE SODIUM 8.6-50 MG PO TABS: 2.0000 | ORAL_TABLET | Freq: Every evening | ORAL | Status: DC | PRN

## 2016-07-21 MED ORDER — STROKE: EARLY STAGES OF RECOVERY BOOK
Freq: Once | Status: AC
  Administered 2016-07-21: 21:00:00
  Filled 2016-07-21 (×2): qty 1

## 2016-07-21 MED ORDER — ENOXAPARIN SODIUM 40 MG/0.4ML ~~LOC~~ SOLN
40.0000 mg | SUBCUTANEOUS | Status: DC
  Administered 2016-07-21 – 2016-07-23 (×3): 40 mg via SUBCUTANEOUS
  Filled 2016-07-21 (×4): qty 0.4

## 2016-07-21 MED ORDER — ACETAMINOPHEN 325 MG PO TABS: 650.0000 mg | ORAL_TABLET | ORAL | Status: DC | PRN

## 2016-07-21 MED ORDER — ACETAMINOPHEN 160 MG/5ML PO SOLN
650.0000 mg | ORAL | Status: DC | PRN
  Filled 2016-07-21: qty 20.3

## 2016-07-21 NOTE — ED Notes (Signed)
Patient transported to CT 

## 2016-07-21 NOTE — Progress Notes (Signed)
**  Preliminary report by tech**  Carotid artery duplex complete. Findings are consistent with a 1-39 percent stenosis involving the right internal carotid artery and the left internal carotid artery. The vertebral artery demonstrates antegrade flow.  07/21/16 3:40 PM Molly Black RVT
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
## 2016-07-21 NOTE — Consult Note (Signed)
SLP Cancellation Note  Patient Details Name: Molly Black MRN: [id number] DOB: [birth year]   Cancelled treatment:        Orders received for cog/com evaluation. Pt currently in ED undergoing testing. Will continue efforts.  Zeferino Mounts B. [place], [facility] [place]  Shonna Chock 07/21/2016, 2:38 PM

## 2016-07-21 NOTE — ED Triage Notes (Addendum)
Pt c/o slurred speech, left side lower facial droop onset Thursday, family noticed on Saturday that speech was slurred and pt was drooling out of left side of mouth. Family noticed left side facial swelling onset yesterday, mostly resolved. No recent infections. No new symptoms today.  Left side mid and lower facial weakness, upper face symmetrical, speech slurred, drooling. Neurological exam otherwise unremarkable. Rapid alternating hand movements intact.

## 2016-07-21 NOTE — Progress Notes (Signed)
Patient arrived to unit by [facility] accompanied by family.  Alert, verbally pleasant.  No complaints at this time of pain or discomfort.  Telemetry applied and verified x's 2.  Vital signs obtained.

## 2016-07-21 NOTE — ED Notes (Signed)
[facility] WILL TRANSPORT PT TO [place] [id number]. [id number]. PT IN NO APPARENT DISTRESS OR PAIN. THE OPPORTUNITY TO ASK QUESTIONS WAS PROVIDED.

## 2016-07-21 NOTE — ED Notes (Signed)
[facility] ARRIVED TO TRANSFER PT TO [place].

## 2016-07-21 NOTE — Progress Notes (Signed)
Patient refusing the use of CPAP at this time. Informed patient if she changes her mind have RN contact RT.

## 2016-07-21 NOTE — ED Notes (Signed)
PT APPEARED TO BE CHOKING, WITH LEFT FACIAL TWITCHING. PT VOMITED WHAT APPEARED TO BE UNDIGESTED CRACKERS. PT'S SPEECH WAS ALSO MORE SLURRED AT THAT TIME. DR. Lita Mains MADE AWARE.
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
## 2016-07-21 NOTE — Telephone Encounter (Signed)
Patient states her mouth is drooping on one side and dripping saliva and her speech is slurred. Denies numbness or weakness to arm or leg.States radiation was to this area in her neck. Per dr Alfredo Bach nurse, radiation would not cause this. Instructed patient to report to the E.R. For evaluation. Patient verbalized understanding.

## 2016-07-21 NOTE — Progress Notes (Signed)
EEG completed; results pending.    

## 2016-07-22 ENCOUNTER — Inpatient Hospital Stay (HOSPITAL_COMMUNITY): Payer: Medicare Other

## 2016-07-22 DIAGNOSIS — I6789 Other cerebrovascular disease: Secondary | ICD-10-CM

## 2016-07-22 DIAGNOSIS — C799 Secondary malignant neoplasm of unspecified site: Secondary | ICD-10-CM

## 2016-07-22 LAB — VAS US CAROTID
LEFT ECA DIAS: -15 cm/s
LEFT VERTEBRAL DIAS: 16 cm/s
LICADDIAS: -24 cm/s
LICAPDIAS: -13 cm/s
LICAPSYS: -40 cm/s
Left CCA dist dias: -15 cm/s
Left CCA dist sys: -57 cm/s
Left CCA prox dias: 26 cm/s
Left CCA prox sys: 134 cm/s
Left ICA dist sys: -48 cm/s
RIGHT ECA DIAS: -17 cm/s
RIGHT VERTEBRAL DIAS: 30 cm/s
Right CCA prox dias: 20 cm/s
Right CCA prox sys: 68 cm/s
Right cca dist sys: -84 cm/s

## 2016-07-22 LAB — ECHOCARDIOGRAM COMPLETE
Height: 62 in
Weight: 2441.6 oz

## 2016-07-22 MED ORDER — PERFLUTREN LIPID MICROSPHERE
1.0000 mL | INTRAVENOUS | Status: AC | PRN
  Administered 2016-07-22: 3 mL via INTRAVENOUS
  Filled 2016-07-22: qty 10

## 2016-07-22 MED ORDER — DEXAMETHASONE SODIUM PHOSPHATE 10 MG/ML IJ SOLN
10.0000 mg | Freq: Once | INTRAMUSCULAR | Status: AC
  Administered 2016-07-22: 10 mg via INTRAVENOUS
  Filled 2016-07-22: qty 1

## 2016-07-22 MED ORDER — LORAZEPAM 2 MG/ML IJ SOLN
0.5000 mg | Freq: Two times a day (BID) | INTRAMUSCULAR | Status: DC | PRN
  Administered 2016-07-24: 0.5 mg via INTRAVENOUS
  Filled 2016-07-22: qty 1

## 2016-07-22 MED ORDER — GADOBENATE DIMEGLUMINE 529 MG/ML IV SOLN
15.0000 mL | Freq: Once | INTRAVENOUS | Status: AC | PRN
  Administered 2016-07-22: 15 mL via INTRAVENOUS

## 2016-07-22 MED ORDER — LACOSAMIDE 50 MG PO TABS
100.0000 mg | ORAL_TABLET | Freq: Two times a day (BID) | ORAL | Status: DC
  Administered 2016-07-22 – 2016-07-24 (×4): 100 mg via ORAL
  Filled 2016-07-22 (×4): qty 2

## 2016-07-22 MED ORDER — DEXAMETHASONE SODIUM PHOSPHATE 4 MG/ML IJ SOLN
4.0000 mg | Freq: Four times a day (QID) | INTRAMUSCULAR | Status: DC
  Administered 2016-07-22 – 2016-07-23 (×6): 4 mg via INTRAVENOUS
  Filled 2016-07-22 (×7): qty 1

## 2016-07-22 NOTE — Evaluation (Signed)
Physical Therapy Evaluation Patient Details Name: Molly Black MRN: [id number] DOB: May 15, 1941 Today's Date: 07/22/2016   History of Present Illness  Molly Black is a 75 y.o. 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 Multiple (4) new intraparenchymal metastases  Clinical Impression   Pt admitted with above diagnosis. Pt currently with functional limitations due to the deficits listed below (see PT Problem List). Presents with mild generalized weakness bil LEs, decr fine motor LUE (will also defer to OT), frequent drooling; Family is looking into helping out more in the home;  Pt will benefit from skilled PT to increase their independence and safety with mobility to allow discharge to the venue listed below.       Follow Up Recommendations Home health PT (24 hour initially would be optimal)    Equipment Recommendations  None recommended by PT (consider 3in1)    Recommendations for Other Services OT consult     Precautions / Restrictions Restrictions Weight Bearing Restrictions: No      Mobility  Bed Mobility Overal bed mobility: Needs Assistance Bed Mobility: Supine to Sit     Supine to sit: Supervision     General bed mobility comments: Used rails  Transfers Overall transfer level: Needs assistance Equipment used: None (pushing IV pole) Transfers: Sit to/from Stand Sit to Stand: Supervision         General transfer comment: Good rise initially from bed; a bit dependent on momentum to stand from low commode  Ambulation/Gait Ambulation/Gait assistance: Min guard;Supervision Ambulation Distance (Feet): 150 Feet Assistive device: None (and pushing IV pole) Gait Pattern/deviations: Step-through pattern   Gait velocity  interpretation: Below normal speed for age/gender General Gait Details: initially with short steps and one small loss of balance from which she recovered without physical assist; progressed to smoother pattern with more time walking; cues to self-monitor for activity tolerance  Stairs Stairs: Yes Stairs assistance: Min guard Stair Management: No rails;Alternating pattern;Forwards Number of Stairs: 5 General stair comments: no difficulty  Wheelchair Mobility    Modified Rankin (Stroke Patients Only)       Balance                                             Pertinent Vitals/Pain Pain Assessment: No/denies pain    Home Living Family/patient expects to be discharged to:: Private residence Living Arrangements: Alone Available Help at Discharge: Family;Available PRN/intermittently Type of Home: House Home Access: Stairs to enter Entrance Stairs-Rails: Left Entrance Stairs-Number of Steps: 3 Home Layout: One level        Prior Function Level of Independence: Independent               Hand Dominance   Dominant Hand: Right    Extremity/Trunk Assessment   Upper Extremity Assessment Upper Extremity Assessment: Defer to OT evaluation    Lower Extremity Assessment Lower Extremity Assessment: Generalized weakness (mild, with noted dependence on momentum to rise form low commode)       Communication   Communication: Expressive difficulties;Other (comment) (frequent need fo rtowel for drooling)  Cognition Arousal/Alertness: Awake/alert Behavior During Therapy: WFL for tasks assessed/performed Overall Cognitive Status: Within Functional Limits for tasks assessed  General Comments      Exercises     Assessment/Plan    PT Assessment Patient needs continued PT services  PT Problem List Decreased strength;Decreased activity tolerance;Decreased balance;Decreased mobility;Decreased knowledge of use of DME           PT Treatment Interventions DME instruction;Gait training;Stair training;Functional mobility training;Therapeutic activities;Therapeutic exercise;Balance training;Patient/family education    PT Goals (Current goals can be found in the Care Plan section)  Acute Rehab PT Goals Patient Stated Goal: did not state PT Goal Formulation: With patient Time For Goal Achievement: 08/05/16 Potential to Achieve Goals: Good    Frequency Min 3X/week   Barriers to discharge Decreased caregiver support Family is looking to provide more assist    Co-evaluation               End of Session Equipment Utilized During Treatment: Gait belt Activity Tolerance: Patient tolerated treatment well Patient left: in bed;with call bell/phone within reach;with family/visitor present Nurse Communication: Mobility status         Time: 1035-1059 PT Time Calculation (min) (ACUTE ONLY): 24 min   Charges:   PT Evaluation $PT Eval Low Complexity: 1 Procedure PT Treatments $Gait Training: 8-22 mins   PT G CodesColletta Maryland 07/22/2016, 11:24 AM  Roney Marion, [place] Pager [phone] Office [phone]

## 2016-07-22 NOTE — Procedures (Signed)
ELECTROENCEPHALOGRAM REPORT  Date of Study: 07/22/2016  Patient's Name: Molly Black MRN: [id number] Date of Birth: [birth year]  Referring Provider: Janece Canterbury, MD  Clinical History: 75 year old woman with non-small cell lung cancer and adenocarcinoma who presents with left facial twitching  Medications: Tylenol ASA Lovenox Senokot   Technical Summary: A multichannel digital EEG recording measured by the international 10-20 system with electrodes applied with paste and impedances below 5000 ohms performed in our laboratory with EKG monitoring in an awake and asleep patient.  Hyperventilation and photic stimulation were not performed.  The digital EEG was referentially recorded, reformatted, and digitally filtered in a variety of bipolar and referential montages for optimal display.    Description: The patient is awake and asleep during the recording.  During maximal wakefulness, there is a symmetric, medium voltage 10 Hz posterior dominant rhythm that attenuates with eye opening.  The record is symmetric.  During drowsiness and sleep, there is an increase in theta slowing of the background.  Vertex waves and symmetric sleep spindles were seen.  There were no epileptiform discharges or electrographic seizures seen.    EKG lead was unremarkable.  Impression: This awake and asleep EEG is normal.    Clinical Correlation: A normal EEG does not exclude a clinical diagnosis of epilepsy.  If further clinical questions remain, prolonged EEG may be helpful.  Clinical correlation is advised.   Metta Clines, DO
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
## 2016-07-22 NOTE — Evaluation (Signed)
Occupational Therapy Evaluation Patient Details Name: Molly Black MRN: [id number] DOB: 08-01-1940 Today's Date: 07/22/2016    History of Present Illness Molly Black is a 75 y.o. 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 Multiple (4) new intraparenchymal metastases, R frontal lobe.   Clinical Impression   Pt was independent prior to admission. 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 Limited session today due to pt going to echo. Pt presents with decreased coordination on L and generalized weakness requiring supervision for mobility. Pt has a supportive family, but does not prefer to rely on them for assistance or medical decisions. Will follow acutely. Recommending HHOT to further address IADL given the area of her lesions.     Follow Up Recommendations  Home health OT;Supervision/Assistance - 24 hour    Equipment Recommendations       Recommendations for Other Services       Precautions / Restrictions Restrictions Weight Bearing Restrictions: No      Mobility Bed Mobility Overal bed mobility: Needs Assistance Bed Mobility: Supine to Sit;Sit to Supine     Supine to sit: Supervision Sit to supine: Supervision   General bed mobility comments: Used rails  Transfers Overall transfer level: Needs assistance Equipment used: None Transfers: Sit to/from Stand Sit to Stand: Supervision         General transfer comment: from bed and toilet    Balance Overall balance assessment: Needs assistance   Sitting balance-Leahy Scale: Good       Standing balance-Leahy Scale: Fair                              ADL Overall ADL's : Needs assistance/impaired Eating/Feeding: Sitting;Set up   Grooming: Wash/dry  hands;Standing;Supervision/safety   Upper Body Bathing: Set up;Sitting   Lower Body Bathing: Supervison/ safety;Sit to/from stand   Upper Body Dressing : Set up;Sitting   Lower Body Dressing: Supervision/safety;Sit to/from stand   Toilet Transfer: Supervision/safety   Toileting- Water quality scientist and Hygiene: Supervision/safety;Sit to/from stand         General ADL Comments: Educated pt and family in executive functions that may be impaired in the home setting.     Vision     Perception     Praxis      Pertinent Vitals/Pain Pain Assessment: No/denies pain     Hand Dominance Right   Extremity/Trunk Assessment Upper Extremity Assessment Upper Extremity Assessment: LUE deficits/detail LUE Deficits / Details: generalized weakness LUE Coordination: decreased fine motor   Lower Extremity Assessment Lower Extremity Assessment: Defer to PT evaluation       Communication Communication Communication: Expressive difficulties (slurred speech, drooling)   Cognition Arousal/Alertness: Awake/alert Behavior During Therapy: WFL for tasks assessed/performed Overall Cognitive Status: Within Functional Limits for tasks assessed                 General Comments: Family describes pt as stubborn at baseline.   General Comments       Exercises       Shoulder Instructions      Home Living Family/patient expects to be discharged to:: Private residence Living Arrangements: Alone Available Help at Discharge: Family;Available 24 hours/day Type of Home: House Home Access: Stairs to enter [employer] of Steps:  3 Entrance Stairs-Rails: Left Home Layout: One level         Bathroom Toilet: Standard     Home Equipment: None      Lives With: Alone (family lives behind)    Prior Functioning/Environment Level of Independence: Independent                 OT Problem List: Decreased activity tolerance;Decreased strength   OT  Treatment/Interventions: Self-care/ADL training;Patient/family education    OT Goals(Current goals can be found in the care plan section) Acute Rehab OT Goals Patient Stated Goal: did not state OT Goal Formulation: With patient Time For Goal Achievement: 07/29/16 Potential to Achieve Goals: Good ADL Goals Pt Will Perform Eating: Independently;sitting (opening containers, cutting with knife and fork) Pt Will Perform Grooming: with modified independence;standing Pt Will Perform Lower Body Bathing: with modified independence;sit to/from stand Pt Will Perform Lower Body Dressing: with modified independence;sit to/from stand Pt Will Transfer to Toilet: with modified independence;ambulating;regular height toilet Pt Will Perform Toileting - Clothing Manipulation and hygiene: with modified independence;sit to/from stand Pt/caregiver will Perform Home Exercise Program: Increased strength;Left upper extremity;Independently Additional ADL Goal #1: Pt and family will be aware of safety issues requiring supervision during IADL.  OT Frequency: Min 2X/week   Barriers to D/C:            Co-evaluation              End of Session Equipment Utilized During Treatment: Gait belt  Activity Tolerance: Patient tolerated treatment well Patient left: in bed (going to echo)   Time: 1105-1120 OT Time Calculation (min): 15 min Charges:  OT General Charges $OT Visit: 1 Procedure OT Evaluation $OT Eval Moderate Complexity: 1 Procedure G-Codes:    Malka So 07/22/2016, 11:54 AM  [phone]
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
## 2016-07-22 NOTE — Progress Notes (Signed)
PROGRESS NOTE  Molly Black [facility] YTK:[id number] DOB: 05/03/1941 DOA: 07/21/2016 PCP: Irven Shelling, MD   LOS: 1 day   Brief Narrative:  75 y.o. female with Stage IV (T2a, N3, M1 B) non-small cell lung cancer, adenocarcinoma, with right lower lobe lung mass, mediastinal and bilateral supraclavicular lymphadenopathy and metastasis to the left adrenal gland who is followed by Drs. Mohamed and [place].  She has been receiving chemoradiation with weekly carboplatin and paclitaxel. She completed stereotactic radiotherapy to her supraclavicular nodes and to her left adrenal metastasis.  She completed radiation in early December.  Overall she stated that she had felt better since starting chemotherapy and radiation. Her lymph nodes have shrunk and she was less short of breath. On Thursday, 6 days prior to admission, she developed some drooling and "swelling" of the left face. She did not notice any slurred speech however family noticed it yesterday and encouraged her to come to the emergency department. She denied any focal numbness, weakness of her left upper extremity, left lower extremity. She did not have any numbness of her left face.  She denied confusion. She was transferred over to [facility] to be evaluated by stroke MD  Assessment & Plan: Principal Problem:   Acute ischemic right MCA stroke [facility]) Active Problems:   Adenocarcinoma of right lung, stage 4 ([place])   Odynophagia   Skin burn   OSA on CPAP   Stroke-like symptoms  - with slurred speech and facial droop. Left facial twitching concerning for possible partial seizure in distribution of previous stroke - MRI was in fact negative for stroke, however it did show what looks like lung metastasis with moderate surrounding vasogenic edema  - Concern for seizures, consulted neurology, appreciate input  Stage IV lung cancer - Discussed with Dr. Julien Nordmann this morning, looks like she has new brain metastasis with surrounding edema.  Radiation oncology consulted. I started patient on Decadron this morning, appreciate further input from radiation oncology    Mild leukopenia and anemia  - due to antineoplastic therapy, stable from prior.  No further labs.  OSA  - improving with weight loss, continue CPAP  Radiation induced esophagitis and odynophagia - resolved  Radiation-induced skin burn:  - She is currently applying Radiaplex EX (not on formulary)   DVT prophylaxis: Lovenox Code Status: Full Family Communication: d/w daughter bedside Disposition Plan: TBD  Consultants:   Neurology  Oncology  Radiation Oncology  Procedures:   2D echo: pending  Antimicrobials:  None    Subjective: - no chest pain, shortness of breath, no abdominal pain, nausea or vomiting. Ongoing stroke like symptoms  Objective: Vitals:   07/22/16 0150 07/22/16 0400 07/22/16 0600 07/22/16 0850  BP: 125/87 118/75 (!) 137/91 136/82  Pulse: 89 70 68 79  Resp: '20 18 18 16  '$ Temp:    97.7 F (36.5 C)  TempSrc:    Oral  SpO2: 98% 95% 94% 97%  Weight:      Height:        Intake/Output Summary (Last 24 hours) at 07/22/16 1107 Last data filed at 07/22/16 0300  Gross per 24 hour  Intake           668.33 ml  Output                0 ml  Net           668.33 ml   Filed Weights   07/21/16 1246 07/21/16 1829  Weight: 72.6 kg (160 lb) 69.2 kg (152  lb 9.6 oz)    Examination: Constitutional: NAD Vitals:   07/22/16 0150 07/22/16 0400 07/22/16 0600 07/22/16 0850  BP: 125/87 118/75 (!) 137/91 136/82  Pulse: 89 70 68 79  Resp: '20 18 18 16  '$ Temp:    97.7 F (36.5 C)  TempSrc:    Oral  SpO2: 98% 95% 94% 97%  Weight:      Height:       Eyes: PERRL, lids and conjunctivae normal Respiratory: clear to auscultation bilaterally, no wheezing, no crackles.  Cardiovascular: Regular rate and rhythm, no murmurs / rubs / gallops.  Abdomen: no tenderness. Bowel sounds positive.  Musculoskeletal: no clubbing / cyanosis. No joint  deformity upper and lower extremities. No contractures. Normal muscle tone.  Neurologic: CN 2-12 grossly intact. Strength 5/5 in all 4. Left facial droop Psychiatric: anxious    Data Reviewed: I have personally reviewed following labs and imaging studies  CBC:  Recent Labs Lab 07/21/16 1157  WBC 3.1*  NEUTROABS 2.3  HGB 11.9*  HCT 33.5*  MCV 91.5  PLT 553   Basic Metabolic Panel:  Recent Labs Lab 07/21/16 1157  NA 139  K 3.9  CL 107  CO2 24  GLUCOSE 88  BUN 17  CREATININE 0.66  CALCIUM 9.1   GFR: Estimated Creatinine Clearance: 57 mL/min (by C-G formula based on SCr of 0.66 mg/dL). Liver Function Tests:  Recent Labs Lab 07/21/16 1157  AST 27  ALT 25  ALKPHOS 38  BILITOT 1.7*  PROT 6.6  ALBUMIN 3.9   No results for input(s): LIPASE, AMYLASE in the last 168 hours. No results for input(s): AMMONIA in the last 168 hours. Coagulation Profile:  Recent Labs Lab 07/21/16 1157  INR 0.94   Cardiac Enzymes: No results for input(s): CKTOTAL, CKMB, CKMBINDEX, TROPONINI in the last 168 hours. BNP (last 3 results) No results for input(s): PROBNP in the last 8760 hours. HbA1C: No results for input(s): HGBA1C in the last 72 hours. CBG: No results for input(s): GLUCAP in the last 168 hours. Lipid Profile: No results for input(s): CHOL, HDL, LDLCALC, TRIG, CHOLHDL, LDLDIRECT in the last 72 hours. Thyroid Function Tests: No results for input(s): TSH, T4TOTAL, FREET4, T3FREE, THYROIDAB in the last 72 hours. Anemia Panel: No results for input(s): VITAMINB12, FOLATE, FERRITIN, TIBC, IRON, RETICCTPCT in the last 72 hours. Urine analysis: No results found for: COLORURINE, APPEARANCEUR, LABSPEC, PHURINE, GLUCOSEU, HGBUR, BILIRUBINUR, KETONESUR, PROTEINUR, UROBILINOGEN, NITRITE, LEUKOCYTESUR Sepsis Labs: Invalid input(s): PROCALCITONIN, LACTICIDVEN  No results found for this or any previous visit (from the past 240 hour(s)).    Radiology Studies: Dg Chest 2  View  Result Date: 07/21/2016 CLINICAL DATA:  Stroke.  Shortness of breath . EXAM: CHEST  2 VIEW COMPARISON:  PET-CT 05/22/2016. FINDINGS: Cardiomegaly. No pulmonary venous congestion. Low lung volumes. Mild right base subsegmental atelectasis noted. Mild infiltrate cannot be excluded. No definite infiltrate or mass lesion in the left lung base is noted on prior PET-CT. No pleural effusion or pneumothorax. Right axillary surgical clips noted. No acute bony abnormality . IMPRESSION: 1. Cardiomegaly.  No evidence of CHF. 2. Mild right base subsegmental atelectasis. Mild infiltrate cannot be excluded. 3. Previously identified left lung base mass/ infiltrate noted on PET-CT of 05/22/2016 no longer identified. Follow-up chest CT can be obtained for confirmation. Electronically Signed   By: Marcello Moores  Register   On: 07/21/2016 14:41   Ct Head Wo Contrast  Result Date: 07/21/2016 CLINICAL DATA:  Slurred speech and left facial droop starting on Thursday.  Left facial swelling. Left lung cancer. EXAM: CT HEAD WITHOUT CONTRAST TECHNIQUE: Contiguous axial images were obtained from the base of the skull through the vertex without intravenous contrast. COMPARISON:  05/22/2016 brain MRI. FINDINGS: Brain: New right anterior MCA distribution region of loss of gray-white junction compatible with subacute stroke, region of involvement 5.2 by 3.7 by 3.7 cm. No associated hemorrhage or other intracranial hemorrhage. Otherwise the brainstem, cerebellum, cerebral peduncles, thalami, basal ganglia, basilar cisterns appear unremarkable. No mass lesion identified. Vascular: Unremarkable Skull: Unremarkable Sinuses/Orbits: Mild chronic left maxillary and right posterior ethmoid sinusitis. Other: No supplemental non-categorized findings. IMPRESSION: 1. New right anterior MCA distribution subacute stroke with loss of gray-white differentiation and hypodensity in the region of involvement. No associated hemorrhage. 2. Minimal chronic  paranasal sinusitis. These results were called by telephone at the time of interpretation on 07/21/2016 at 12:52 pm to Dr. Julianne Rice , who verbally acknowledged these results. Electronically Signed   By: Van Clines M.D.   On: 07/21/2016 12:52   Mr Jeri Cos JI Contrast  Result Date: 07/22/2016 CLINICAL DATA:  Lung cancer.  Slurred speech. EXAM: MRI HEAD WITHOUT AND WITH CONTRAST MRA HEAD WITHOUT CONTRAST TECHNIQUE: Multiplanar, multiecho pulse sequences of the brain and surrounding structures were obtained without and with intravenous contrast. Angiographic images of the head were obtained using MRA technique without contrast. CONTRAST:  51m MULTIHANCE GADOBENATE DIMEGLUMINE 529 MG/ML IV SOLN COMPARISON:  Head CT 07/21/2016 Brain MRI 05/22/2016 FINDINGS: MRI HEAD FINDINGS Brain: There are 4 distinct contrast-enhancing mass is within the right frontal lobe. The largest lesion is located within the posterolateral right frontal lobe with contrast-enhancing component measuring 2.0 x 1.0 cm with surrounding vasogenic edema. The 3 smaller lesions are located more anteriorly. The 2 larger lesions each measure approximately 8 x 8 mm; the smallest lesion measures 6 x 6 mm. There is mild surrounding vasogenic edema at the sites of these lesions. No other contrast-enhancing lesions are identified. There is additional multifocal hyperintense T2-weighted signal within the periventricular white matter, most often seen in the setting of chronic microvascular ischemia. No hydrocephalus or extra-axial fluid collection. The midline structures are normal. No age advanced or lobar predominant atrophy. Vascular: Major intracranial arterial and venous sinus flow voids are preserved. No evidence of chronic microhemorrhage or amyloid angiopathy. Skull and upper cervical spine: The visualized skull base, calvarium, upper cervical spine and extracranial soft tissues are normal. Sinuses/Orbits: Bilateral maxillary floor  retention cysts. Normal orbits. MRA HEAD FINDINGS Intracranial internal carotid arteries: There is a small, inferiorly directed outpouching from the communicating segment of the right internal carotid artery that measures 2 mm base to apex and 2 mm at its base. The internal carotid arteries are otherwise normal. Anterior cerebral arteries: Normal. Middle cerebral arteries: Normal. Posterior communicating arteries: Absent bilaterally. Posterior cerebral arteries: Normal. Basilar artery: Normal. Vertebral arteries: Codominant. Normal. Superior cerebellar arteries: Normal. Anterior inferior cerebellar arteries: Normal on the left. Not clearly seen on the right. Posterior inferior cerebellar arteries: Right-dominant. IMPRESSION: 1. Multiple (4) new intraparenchymal metastases within the right frontal lobe. Moderate surrounding vasogenic edema, greatest at the largest, most posterolateral lesion. No herniation, significant mass effect or acute hemorrhage. 2. No acute ischemic infarct. 3. 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 4. No intracranial arterial occlusion or high-grade stenosis. Electronically Signed   By: KUlyses JarredM.D.   On: 07/22/2016 05:33   Mr MJodene NamHead/brain WRCCm  Result Date:  07/22/2016 CLINICAL DATA:  Lung cancer.  Slurred speech. EXAM: MRI HEAD WITHOUT AND WITH CONTRAST MRA HEAD WITHOUT CONTRAST TECHNIQUE: Multiplanar, multiecho pulse sequences of the brain and surrounding structures were obtained without and with intravenous contrast. Angiographic images of the head were obtained using MRA technique without contrast. CONTRAST:  20m MULTIHANCE GADOBENATE DIMEGLUMINE 529 MG/ML IV SOLN COMPARISON:  Head CT 07/21/2016 Brain MRI 05/22/2016 FINDINGS: MRI HEAD FINDINGS Brain: There are 4 distinct contrast-enhancing mass is within the right frontal lobe. The largest lesion is located within the posterolateral right frontal  lobe with contrast-enhancing component measuring 2.0 x 1.0 cm with surrounding vasogenic edema. The 3 smaller lesions are located more anteriorly. The 2 larger lesions each measure approximately 8 x 8 mm; the smallest lesion measures 6 x 6 mm. There is mild surrounding vasogenic edema at the sites of these lesions. No other contrast-enhancing lesions are identified. There is additional multifocal hyperintense T2-weighted signal within the periventricular white matter, most often seen in the setting of chronic microvascular ischemia. No hydrocephalus or extra-axial fluid collection. The midline structures are normal. No age advanced or lobar predominant atrophy. Vascular: Major intracranial arterial and venous sinus flow voids are preserved. No evidence of chronic microhemorrhage or amyloid angiopathy. Skull and upper cervical spine: The visualized skull base, calvarium, upper cervical spine and extracranial soft tissues are normal. Sinuses/Orbits: Bilateral maxillary floor retention cysts. Normal orbits. MRA HEAD FINDINGS Intracranial internal carotid arteries: There is a small, inferiorly directed outpouching from the communicating segment of the right internal carotid artery that measures 2 mm base to apex and 2 mm at its base. The internal carotid arteries are otherwise normal. Anterior cerebral arteries: Normal. Middle cerebral arteries: Normal. Posterior communicating arteries: Absent bilaterally. Posterior cerebral arteries: Normal. Basilar artery: Normal. Vertebral arteries: Codominant. Normal. Superior cerebellar arteries: Normal. Anterior inferior cerebellar arteries: Normal on the left. Not clearly seen on the right. Posterior inferior cerebellar arteries: Right-dominant. IMPRESSION: 1. Multiple (4) new intraparenchymal metastases within the right frontal lobe. Moderate surrounding vasogenic edema, greatest at the largest, most posterolateral lesion. No herniation, significant mass effect or acute  hemorrhage. 2. No acute ischemic infarct. 3. 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 4. No intracranial arterial occlusion or high-grade stenosis. Electronically Signed   By: KUlyses JarredM.D.   On: 07/22/2016 05:33   Scheduled Meds: . aspirin  300 mg Rectal Daily   Or  . aspirin  325 mg Oral Daily  . dexamethasone  4 mg Intravenous Q6H  . enoxaparin (LOVENOX) injection  40 mg Subcutaneous Q24H   Continuous Infusions:   CMarzetta Board MD, PhD [facility] Pager [phone] If 7PM-7AM, please contact night-coverage [email or web address] Password [email or web address] 07/22/2016, 11:07 AM

## 2016-07-22 NOTE — Evaluation (Signed)
Clinical/Bedside Swallow Evaluation Patient Details  Name: Molly Black MRN: [id number] Date of Birth: June 14, 1941  Today's Date: 07/22/2016 Time: SLP Start Time (ACUTE ONLY): 1013 SLP Stop Time (ACUTE ONLY): 0102 SLP Time Calculation (min) (ACUTE ONLY): 20 min  Past Medical History:  Past Medical History:  Diagnosis Date  . Adenocarcinoma of right lung, stage 4 ([place]) 2016-06-02  . Anxiety    with death of brother none since  . Carcinoma of breast, stage 2, estrogen receptor negative, right ([place])    T2N0 right breast mastectomy/TRAM reconstruction ER negative PR positive  June 1989  Then CMF chemo  . Cystadenofibroma of ovary, unspecified laterality   . Diverticulosis of colon without diverticulitis   . Encounter for antineoplastic chemotherapy 06/01/2016  . Gastric ulcer   . GERD (gastroesophageal reflux disease)    takes Nexium daily  . H/O hiatal hernia   . Hemangioma of liver   . Hiatal hernia   . Neuropathy, peripheral ([place])   . Odynophagia 06/29/2016  . OSA on CPAP    setting 15- uses occ.  . Pneumonia    at age 19 years old  . PONV (postoperative nausea and vomiting)   . Schatzki's ring   . Shortness of breath   . Skin burn 06/29/2016  . Skin cancer of face    "had some places frozen off" (04/13/2013)  . Tubular adenoma of colon    Past Surgical History:  Past Surgical History:  Procedure Laterality Date  . ABDOMINAL HYSTERECTOMY  1989  . ANKLE FRACTURE SURGERY Right ?1996  . BREAST BIOPSY Right 1963; 1981; 1989  . BREAST CAPSULECTOMY WITH IMPLANT EXCHANGE Right 02/18/3663   "silicon gel implant" (10/27/4740)  . BREAST IMPLANT REMOVAL Right 04/13/2013   Procedure: REMOVAL RIGHT RUPTURED BREAST IMPLANTS, DELAYED BREAST RECONSTRUCTION WITH SILICONE GEL IMPLANTS;  Surgeon: Crissie Reese, MD;  Location: [facility];  Service: Plastics;  Laterality: Right;  . BREAST LUMPECTOMY Right 1963; 1981; 1989   "benign; benign; malignant"  . BREAST RECONSTRUCTION Right   . BREAST  RECONSTRUCTION WITH PLACEMENT OF TISSUE EXPANDER AND FLEX HD (ACELLULAR HYDRATED DERMIS) Right 1989  . CAPSULECTOMY Right 04/13/2013   Procedure: CAPSULECTOMY;  Surgeon: Crissie Reese, MD;  Location: [facility];  Service: Plastics;  Laterality: Right;  . CATARACT EXTRACTION W/PHACO Right 10/18/2012   Procedure: CATARACT EXTRACTION PHACO AND INTRAOCULAR LENS PLACEMENT (IOC);  Surgeon: Elta Guadeloupe T. Gershon Crane, MD;  Location: [facility];  Service: Ophthalmology;  Laterality: Right;  CDE:7.67  . CATARACT EXTRACTION W/PHACO Left 11/01/2012   Procedure: CATARACT EXTRACTION PHACO AND INTRAOCULAR LENS PLACEMENT (IOC);  Surgeon: Elta Guadeloupe T. Gershon Crane, MD;  Location: [facility];  Service: Ophthalmology;  Laterality: Left;  CDE:9.92  . CHOLECYSTECTOMY  1990's  . ESOPHAGOGASTRODUODENOSCOPY N/A 10/13/2013   Procedure: ESOPHAGOGASTRODUODENOSCOPY (EGD);  Surgeon: Jerene Bears, MD;  Location: [facility];  Service: Gastroenterology;  Laterality: N/A;  . MASTECTOMY COMPLETE / SIMPLE W/ SENTINEL NODE BIOPSY Right 1989  . RECONSTRUCTION / CORRECTION OF NIPPLE / AEROLA Right 1989  . WRIST FRACTURE SURGERY Right ~ 2007   "put a pin in it" (04/13/2013)   HPI:  75 y.o.femalewith Stage IV non-small cell lung cancer, adenocarcinoma, with right lower lobe lung mass (weekly chemo, completed radiation early Dec)) mediastinal and bilateral supraclavicular lymphadenopathy and metastasis to the left adrenal gland who  her left adrenal metastasis. Per chart developed drooling and "swelling" of the left face 6 days ago. Family noted slurred speech yesterday and encouraged her to come to the emergency department.  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    Assessment / Plan / Recommendation Clinical Impression  Pt reports odonophagia intermittently due to recent radiation (neck lymph nodes) but denies coughing or strangling. She consistently  extended head mildly during swallows stating she "got used to doing this during radiation." No coughing, throat clearing or wet vocal quality present during assessment. Persistent left labial spill and residue with senses majority of the time. Recommend Dys 3 texture and thin liquids, straws allowed, pills whole in applesauce, check left oral cavity for pocketed food. ST will follow up.       Aspiration Risk   (mild-mod)    Diet Recommendation Dysphagia 3 (Mech soft);Thin liquid   Liquid Administration via: Straw;Cup Medication Administration: Whole meds with puree Supervision: Patient able to self feed;Intermittent supervision to cue for compensatory strategies Compensations: Slow rate;Small sips/bites;Lingual sweep for clearance of pocketing Postural Changes: Seated upright at 90 degrees    Other  Recommendations Oral Care Recommendations: Oral care BID   Follow up Recommendations None      Frequency and Duration min 1 x/week  1 week       Prognosis Prognosis for Safe Diet Advancement:  (fair-good )      Swallow Study   General HPI: 75 y.o.femalewith Stage IV non-small cell lung cancer, adenocarcinoma, with right lower lobe lung mass (weekly chemo, completed radiation early Dec)) mediastinal and bilateral supraclavicular lymphadenopathy and metastasis to the left adrenal gland who  her left adrenal metastasis. Per chart developed drooling and "swelling" of the left face 6 days ago. Family noted slurred speech yesterday and encouraged her to come to the emergency department. 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  Type of Study: Bedside Swallow Evaluation Previous Swallow Assessment: none Diet Prior to this Study: NPO Temperature Spikes Noted: No Respiratory Status: Room air History of Recent Intubation: No Behavior/Cognition: Alert;Cooperative;Pleasant  mood Oral Cavity Assessment: Within Functional Limits Oral Care Completed by SLP: No Oral Cavity - Dentition: Adequate natural dentition Vision: Functional for self-feeding Self-Feeding Abilities: Able to feed self Patient Positioning: Upright in bed Baseline Vocal Quality: Normal Volitional Cough: Strong Volitional Swallow: Able to elicit    Oral/Motor/Sensory Function Overall Oral Motor/Sensory Function:  (mod-severe)   Ice Chips Ice chips: Not tested   Thin Liquid Thin Liquid: Impaired Presentation: Cup;Straw Oral Phase Impairments: Reduced labial seal Oral Phase Functional Implications: Left anterior spillage Pharyngeal  Phase Impairments: Suspected delayed Swallow    Nectar Thick Nectar Thick Liquid: Not tested   Honey Thick Honey Thick Liquid: Not tested   Puree Puree: Impaired Oral Phase Impairments: Reduced labial seal Oral Phase Functional Implications: Left anterior spillage   Solid   GO   Solid: Impaired Presentation: Self Fed Oral Phase Impairments: Reduced labial seal Oral Phase Functional Implications:  (left labial residue) Pharyngeal Phase Impairments: Suspected delayed Swallow        Houston Siren 07/22/2016,11:29 AM   Orbie Pyo Colvin Caroli.Ed [employer] [phone]

## 2016-07-22 NOTE — Progress Notes (Signed)
CCMD reported patient had 10 beats of Vtach at 5:01. RN went to patient's room, patient was sitting up in chair, asked nurse to put dinner tray over her lap to eat. Denies any pain, no sweating, no dizziness. Tech currently getting patient's vitals. MD Gherghe paged to notify. Will continue to monitor.

## 2016-07-22 NOTE — Progress Notes (Signed)
MD notified that patient is tearful, anxious, no PRNs currently for patient. No new orders at this time.

## 2016-07-22 NOTE — Progress Notes (Signed)
RT NOTE:  Pt refuses CPAP tonight.

## 2016-07-22 NOTE — Progress Notes (Signed)
  Echocardiogram 2D Echocardiogram with Definity has been performed.  Diamond Nickel 07/22/2016, 12:21 PM

## 2016-07-22 NOTE — Progress Notes (Signed)
RN received call from MRI. Per MRI, patient unable to have MRI ordered today d/t contrast administration last night. MD notified.

## 2016-07-22 NOTE — Progress Notes (Signed)
Pt went down for MRI at 0150, Got a call from MRI staff that pt was having  twitching of the left face which lasted for about a minute and went away, had the scan and was brought back up, pt reassured, will however continue to monitor, v/s stable. [place]-Asidi, Shyleigh Daughtry Efe

## 2016-07-23 DIAGNOSIS — I63511 Cerebral infarction due to unspecified occlusion or stenosis of right middle cerebral artery: Secondary | ICD-10-CM

## 2016-07-23 MED ORDER — GUAIFENESIN 100 MG/5ML PO SOLN: 5.0000 mL | ORAL | Status: DC | PRN

## 2016-07-23 NOTE — Care Management Note (Addendum)
Case Management Note  Patient Details  Name: Molly Black MRN: [id number] Date of Birth: February 22, 1941  Subjective/Objective:  Pt admitted with brain mets. She has a history of lung CA. She is from home alone.                   Action/Plan: PT/OT recommending [facility] services. CM following for d/c needs.   Expected Discharge Date:                  Expected Discharge Plan:  [place]  In-House Referral:     Discharge planning Services     Post Acute Care Choice:    Choice offered to:     DME Arranged:    DME Agency:     HH Arranged:    [place] Agency:     Status of Service:  In process, will continue to follow  If discussed at Long Length of Stay Meetings, dates discussed:    Additional Comments:  Pollie Friar, RN 07/23/2016, 11:39 AM

## 2016-07-23 NOTE — Progress Notes (Signed)
PROGRESS NOTE  Molly Black [facility] [place] GQQ:[id number] DOB: [birth year] DOA: 07/21/2016 PCP: Irven Shelling, MD   LOS: 2 days   Brief Narrative:  75 y.o. female with Stage IV (T2a, N3, M1 B) non-small cell lung cancer, adenocarcinoma, with right lower lobe lung mass, mediastinal and bilateral supraclavicular lymphadenopathy and metastasis to the left adrenal gland who is followed by Drs. Mohamed and Maria Antonia.  She has been receiving chemoradiation with weekly carboplatin and paclitaxel. She completed stereotactic radiotherapy to her supraclavicular nodes and to her left adrenal metastasis.  She completed radiation in early December.  Overall she stated that she had felt better since starting chemotherapy and radiation. Her lymph nodes have shrunk and she was less short of breath. On Thursday, 6 days prior to admission, she developed some drooling and "swelling" of the left face. She did not notice any slurred speech however family noticed it yesterday and encouraged her to come to the emergency department. She denied any focal numbness, weakness of her left upper extremity, left lower extremity. She did not have any numbness of her left face.  She denied confusion. She was transferred over to [facility] to be evaluated for stroke  Assessment & Plan: Principal Problem:   Acute ischemic right MCA stroke 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 Left facial twitching concerning for possible partial seizure in distribution of previous stroke - MRI was in fact negative for stroke, however it did show what looks like lung metastasis with moderate surrounding vasogenic edema  - Concern for seizures, consulted neurology, appreciate input, EEG negative but for now continue Vimpat  Stage IV lung cancer with brain mets - Discussed with Dr.  Julien Nordmann 12/27, looks like she has new brain metastasis with surrounding edema. Radiation oncology consulted, d/w Worthy Flank PA last night - started on Decadron, some improvement today, continue IV steroids for now - awaiting neurosurgery input today, repeat MRI pending   Mild leukopenia and anemia  - due to antineoplastic therapy, stable from prior.  No further labs.  OSA  - improving with weight loss, continue CPAP  Radiation induced esophagitis and odynophagia - resolved  Radiation-induced skin burn:  - She is currently applying [employer] EX (not on formulary)   DVT prophylaxis: Lovenox Code Status: Full Family Communication: d/w daughter bedside Disposition Plan: home 1 day   Consultants:   Neurology  Oncology  Radiation Oncology  Neurosurgery   Procedures:   2D echo Study Conclusions - Left ventricle: The cavity size was normal. There was mild concentric hypertrophy. Systolic function was normal. The estimated ejection fraction was in the range of 55% to 60%. Wall motion was normal; there were no regional wall motion abnormalities. Doppler parameters are consistent with abnormal left ventricular relaxation (grade 1 diastolic dysfunction). There was no evidence of elevated ventricular filling pressure by Doppler parameters. - Aortic valve: Trileaflet; normal thickness leaflets. There was no regurgitation. - Aortic root: The aortic root was normal in size. - Ascending aorta: The ascending aorta was normal in size. - Mitral valve: Structurally normal valve. There was no regurgitation. - Left atrium: The atrium was normal in size. - Right ventricle: The cavity size was normal. Wall thickness was normal. Systolic function was normal. - Right atrium: The atrium was normal in size. - Tricuspid valve: There was trivial regurgitation. -  Pulmonic valve: There was no regurgitation. - Pulmonary arteries: Systolic pressure was within the normal range. - Inferior vena  cava: The vessel was normal in size. - Pericardium, extracardiac: There was no pericardial effusion.  Antimicrobials:  None    Subjective: - no chest pain, shortness of breath, no abdominal pain, nausea or vomiting. Ongoing stroke like symptoms  Objective: Vitals:   07/22/16 2110 07/23/16 0100 07/23/16 0459 07/23/16 1005  BP: 127/74 122/70 115/79 116/81  Pulse: 97 85 85 86  Resp: '20 20 20 20  '$ Temp: 99.1 F (37.3 C) 97.8 F (36.6 C) 98.7 F (37.1 C) 99 F (37.2 C)  TempSrc: Oral Oral Oral Oral  SpO2: 94% 93% 96% 96%  Weight:      Height:       No intake or output data in the 24 hours ending 07/23/16 1321 Filed Weights   07/21/16 1246 07/21/16 1829  Weight: 72.6 kg (160 lb) 69.2 kg (152 lb 9.6 oz)    Examination: Constitutional: NAD Vitals:   07/22/16 2110 07/23/16 0100 07/23/16 0459 07/23/16 1005  BP: 127/74 122/70 115/79 116/81  Pulse: 97 85 85 86  Resp: '20 20 20 20  '$ Temp: 99.1 F (37.3 C) 97.8 F (36.6 C) 98.7 F (37.1 C) 99 F (37.2 C)  TempSrc: Oral Oral Oral Oral  SpO2: 94% 93% 96% 96%  Weight:      Height:       Eyes: PERRL, lids and conjunctivae normal Respiratory: clear to auscultation bilaterally, no wheezing, no crackles.  Cardiovascular: Regular rate and rhythm, no murmurs / rubs / gallops.  Abdomen: no tenderness. Bowel sounds positive.  Musculoskeletal: no clubbing / cyanosis. No joint deformity upper and lower extremities. No contractures. Normal muscle tone.  Neurologic: CN 2-12 grossly intact. Strength 5/5 in all 4. Left facial droop Psychiatric: anxious    Data Reviewed: I have personally reviewed following labs and imaging studies  CBC:  Recent Labs Lab 07/21/16 1157  WBC 3.1*  NEUTROABS 2.3  HGB 11.9*  HCT 33.5*  MCV 91.5  PLT 833   Basic Metabolic Panel:  Recent Labs Lab 07/21/16 1157  NA 139  K 3.9  CL 107  CO2 24  GLUCOSE 88  BUN 17  CREATININE 0.66  CALCIUM 9.1   GFR: Estimated Creatinine Clearance: 57 mL/min  (by C-G formula based on SCr of 0.66 mg/dL). Liver Function Tests:  Recent Labs Lab 07/21/16 1157  AST 27  ALT 25  ALKPHOS 38  BILITOT 1.7*  PROT 6.6  ALBUMIN 3.9   No results for input(s): LIPASE, AMYLASE in the last 168 hours. No results for input(s): AMMONIA in the last 168 hours. Coagulation Profile:  Recent Labs Lab 07/21/16 1157  INR 0.94   Cardiac Enzymes: No results for input(s): CKTOTAL, CKMB, CKMBINDEX, TROPONINI in the last 168 hours. BNP (last 3 results) No results for input(s): PROBNP in the last 8760 hours. HbA1C: No results for input(s): HGBA1C in the last 72 hours. CBG: No results for input(s): GLUCAP in the last 168 hours. Lipid Profile: No results for input(s): CHOL, HDL, LDLCALC, TRIG, CHOLHDL, LDLDIRECT in the last 72 hours. Thyroid Function Tests: No results for input(s): TSH, T4TOTAL, FREET4, T3FREE, THYROIDAB in the last 72 hours. Anemia Panel: No results for input(s): VITAMINB12, FOLATE, FERRITIN, TIBC, IRON, RETICCTPCT in the last 72 hours. Urine analysis: No results found for: COLORURINE, APPEARANCEUR, LABSPEC, PHURINE, GLUCOSEU, HGBUR, BILIRUBINUR, KETONESUR, PROTEINUR, UROBILINOGEN, NITRITE, LEUKOCYTESUR Sepsis Labs: Invalid input(s): PROCALCITONIN, LACTICIDVEN  No results found  for this or any previous visit (from the past 240 hour(s)).    Radiology Studies: Dg Chest 2 View  Result Date: 07/21/2016 CLINICAL DATA:  Stroke.  Shortness of breath . EXAM: CHEST  2 VIEW COMPARISON:  PET-CT 05/22/2016. FINDINGS: Cardiomegaly. No pulmonary venous congestion. Low lung volumes. Mild right base subsegmental atelectasis noted. Mild infiltrate cannot be excluded. No definite infiltrate or mass lesion in the left lung base is noted on prior PET-CT. No pleural effusion or pneumothorax. Right axillary surgical clips noted. No acute bony abnormality . IMPRESSION: 1. Cardiomegaly.  No evidence of CHF. 2. Mild right base subsegmental atelectasis. Mild  infiltrate cannot be excluded. 3. Previously identified left lung base mass/ infiltrate noted on PET-CT of 05/22/2016 no longer identified. Follow-up chest CT can be obtained for confirmation. Electronically Signed   By: Marcello Moores  Register   On: 07/21/2016 14:41   Mr Brain W Wo Contrast  Result Date: 07/22/2016 CLINICAL DATA:  Lung cancer.  Slurred speech. EXAM: MRI HEAD WITHOUT AND WITH CONTRAST MRA HEAD WITHOUT CONTRAST TECHNIQUE: Multiplanar, multiecho pulse sequences of the brain and surrounding structures were obtained without and with intravenous contrast. Angiographic images of the head were obtained using MRA technique without contrast. CONTRAST:  45m MULTIHANCE GADOBENATE DIMEGLUMINE 529 MG/ML IV SOLN COMPARISON:  Head CT 07/21/2016 Brain MRI 05/22/2016 FINDINGS: MRI HEAD FINDINGS Brain: There are 4 distinct contrast-enhancing mass is within the right frontal lobe. The largest lesion is located within the posterolateral right frontal lobe with contrast-enhancing component measuring 2.0 x 1.0 cm with surrounding vasogenic edema. The 3 smaller lesions are located more anteriorly. The 2 larger lesions each measure approximately 8 x 8 mm; the smallest lesion measures 6 x 6 mm. There is mild surrounding vasogenic edema at the sites of these lesions. No other contrast-enhancing lesions are identified. There is additional multifocal hyperintense T2-weighted signal within the periventricular white matter, most often seen in the setting of chronic microvascular ischemia. No hydrocephalus or extra-axial fluid collection. The midline structures are normal. No age advanced or lobar predominant atrophy. Vascular: Major intracranial arterial and venous sinus flow voids are preserved. No evidence of chronic microhemorrhage or amyloid angiopathy. Skull and upper cervical spine: The visualized skull base, calvarium, upper cervical spine and extracranial soft tissues are normal. Sinuses/Orbits: Bilateral maxillary floor  retention cysts. Normal orbits. MRA HEAD FINDINGS Intracranial internal carotid arteries: There is a small, inferiorly directed outpouching from the communicating segment of the right internal carotid artery that measures 2 mm base to apex and 2 mm at its base. The internal carotid arteries are otherwise normal. Anterior cerebral arteries: Normal. Middle cerebral arteries: Normal. Posterior communicating arteries: Absent bilaterally. Posterior cerebral arteries: Normal. Basilar artery: Normal. Vertebral arteries: Codominant. Normal. Superior cerebellar arteries: Normal. Anterior inferior cerebellar arteries: Normal on the left. Not clearly seen on the right. Posterior inferior cerebellar arteries: Right-dominant. IMPRESSION: 1. Multiple (4) new intraparenchymal metastases within the right frontal lobe. Moderate surrounding vasogenic edema, greatest at the largest, most posterolateral lesion. No herniation, significant mass effect or acute hemorrhage. 2. No acute ischemic infarct. 3. 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 4. No intracranial arterial occlusion or high-grade stenosis. Electronically Signed   By: KUlyses JarredM.D.   On: 07/22/2016 05:33   Mr MJodene NamHead/brain WYWCm  Result Date: 07/22/2016 CLINICAL DATA:  Lung cancer.  Slurred speech. EXAM: MRI HEAD WITHOUT AND WITH CONTRAST MRA HEAD WITHOUT CONTRAST TECHNIQUE: Multiplanar, multiecho  pulse sequences of the brain and surrounding structures were obtained without and with intravenous contrast. Angiographic images of the head were obtained using MRA technique without contrast. CONTRAST:  32m MULTIHANCE GADOBENATE DIMEGLUMINE 529 MG/ML IV SOLN COMPARISON:  Head CT 07/21/2016 Brain MRI 05/22/2016 FINDINGS: MRI HEAD FINDINGS Brain: There are 4 distinct contrast-enhancing mass is within the right frontal lobe. The largest lesion is located within the posterolateral right frontal  lobe with contrast-enhancing component measuring 2.0 x 1.0 cm with surrounding vasogenic edema. The 3 smaller lesions are located more anteriorly. The 2 larger lesions each measure approximately 8 x 8 mm; the smallest lesion measures 6 x 6 mm. There is mild surrounding vasogenic edema at the sites of these lesions. No other contrast-enhancing lesions are identified. There is additional multifocal hyperintense T2-weighted signal within the periventricular white matter, most often seen in the setting of chronic microvascular ischemia. No hydrocephalus or extra-axial fluid collection. The midline structures are normal. No age advanced or lobar predominant atrophy. Vascular: Major intracranial arterial and venous sinus flow voids are preserved. No evidence of chronic microhemorrhage or amyloid angiopathy. Skull and upper cervical spine: The visualized skull base, calvarium, upper cervical spine and extracranial soft tissues are normal. Sinuses/Orbits: Bilateral maxillary floor retention cysts. Normal orbits. MRA HEAD FINDINGS Intracranial internal carotid arteries: There is a small, inferiorly directed outpouching from the communicating segment of the right internal carotid artery that measures 2 mm base to apex and 2 mm at its base. The internal carotid arteries are otherwise normal. Anterior cerebral arteries: Normal. Middle cerebral arteries: Normal. Posterior communicating arteries: Absent bilaterally. Posterior cerebral arteries: Normal. Basilar artery: Normal. Vertebral arteries: Codominant. Normal. Superior cerebellar arteries: Normal. Anterior inferior cerebellar arteries: Normal on the left. Not clearly seen on the right. Posterior inferior cerebellar arteries: Right-dominant. IMPRESSION: 1. Multiple (4) new intraparenchymal metastases within the right frontal lobe. Moderate surrounding vasogenic edema, greatest at the largest, most posterolateral lesion. No herniation, significant mass effect or acute  hemorrhage. 2. No acute ischemic infarct. 3. 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 4. No intracranial arterial occlusion or high-grade stenosis. Electronically Signed   By: KUlyses JarredM.D.   On: 07/22/2016 05:33   Scheduled Meds: . aspirin  300 mg Rectal Daily   Or  . aspirin  325 mg Oral Daily  . dexamethasone  4 mg Intravenous Q6H  . enoxaparin (LOVENOX) injection  40 mg Subcutaneous Q24H  . lacosamide  100 mg Oral BID   Continuous Infusions:   CMarzetta Board MD, PhD [facility] Pager [phone] If 7PM-7AM, please contact night-coverage [email or web address] Password [email or web address] 07/23/2016, 1:21 PM

## 2016-07-23 NOTE — Progress Notes (Signed)
EEG was normal. However, this does not rule out epilepsy. Continue Vimpat.   Electronically signed: Dr. Kerney Elbe
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
## 2016-07-23 NOTE — Progress Notes (Signed)
Physical Therapy Treatment Patient Details Name: Molly Black MRN: [id number] DOB: [birth year] Today's Date: 07/23/2016    History of Present Illness Molly Black is a 75 y.o. 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 Multiple (4) new intraparenchymal metastases R frontal lobe.    PT Comments    Pt performed increased mobility, increasing gait distance and performing standing exercise and balance activities.  Will continue to recommend HHPT to address safety concerns at home.    Follow Up Recommendations  Home health PT     Equipment Recommendations  None recommended by PT    Recommendations for Other Services       Precautions / Restrictions Restrictions Weight Bearing Restrictions: No    Mobility  Bed Mobility Overal bed mobility: Needs Assistance Bed Mobility: Supine to Sit;Sit to Supine     Supine to sit: Modified independent (Device/Increase time) Sit to supine: Modified independent (Device/Increase time)   General bed mobility comments: Good technique observed.    Transfers Overall transfer level: Needs assistance Equipment used: None Transfers: Sit to/from Stand Sit to Stand: Supervision         General transfer comment: from bed and toilet  Ambulation/Gait Ambulation/Gait assistance: Min guard Ambulation Distance (Feet): 400 Feet Assistive device: None Gait Pattern/deviations: Step-through pattern;Wide base of support;Shuffle   Gait velocity interpretation: Below normal speed for age/gender General Gait Details: Minor LOB noted but able to right.  Pt required cues for reciprocal armswing and improving B foot clearance.     Stairs     Stair Management: Alternating pattern;Forwards;No rails;Two  rails Number of Stairs: 4 General stair comments: x2 with no rails LOB to the R, x2 with rails and no LOB.    Wheelchair Mobility    Modified Rankin (Stroke Patients Only)       Balance Overall balance assessment: Needs assistance   Sitting balance-Leahy Scale: Good       Standing balance-Leahy Scale: Fair           Rhomberg - Eyes Opened:  (no variation) Rhomberg - Eyes Closed:  (with and without perturbations.  MIld LOB but able to right.  ) High level balance activites: Turns      Cognition Arousal/Alertness: Awake/alert Behavior During Therapy: WFL for tasks assessed/performed Overall Cognitive Status: Within Functional Limits for tasks assessed                 General Comments: Family describes pt as stubborn at baseline.    Exercises General Exercises - Lower Extremity Hip Flexion/Marching: AROM;Both;10 reps;Standing Heel Raises: AROM;Both;10 reps;Standing (2sets.) Mini-Sqauts: AROM;Both;10 reps;Standing    General Comments        Pertinent Vitals/Pain Pain Assessment: No/denies pain    Home Living                      Prior Function            PT Goals (current goals can now be found in the care plan section) Acute Rehab PT Goals Potential to Achieve Goals: Good Progress towards PT goals: Progressing toward goals    Frequency    Min 3X/week      PT Plan Current plan remains appropriate    Co-evaluation  End of Session Equipment Utilized During Treatment: Gait belt Activity Tolerance: Patient tolerated treatment well Patient left: in bed;with call bell/phone within reach;with family/visitor present     Time: 1542-1610 PT Time Calculation (min) (ACUTE ONLY): 28 min  Charges:  $Gait Training: 8-22 mins $Therapeutic Exercise: 8-22 mins                    G Codes:      Cristela Blue Aug 04, 2016, 4:18 PM  Governor Rooks, PTA pager [phone]

## 2016-07-24 ENCOUNTER — Inpatient Hospital Stay (HOSPITAL_COMMUNITY): Payer: Medicare Other

## 2016-07-24 DIAGNOSIS — R569 Unspecified convulsions: Secondary | ICD-10-CM

## 2016-07-24 MED ORDER — GADOBENATE DIMEGLUMINE 529 MG/ML IV SOLN
15.0000 mL | Freq: Once | INTRAVENOUS | Status: AC
  Administered 2016-07-24: 15 mL via INTRAVENOUS

## 2016-07-24 MED ORDER — DEXAMETHASONE 1 MG/ML PO CONC: 4.0000 mg | Freq: Four times a day (QID) | ORAL | 2 refills | Status: DC

## 2016-07-24 MED ORDER — LACOSAMIDE 100 MG PO TABS: 100.0000 mg | ORAL_TABLET | Freq: Two times a day (BID) | ORAL | 0 refills | Status: DC

## 2016-07-24 MED ORDER — DEXAMETHASONE 1 MG/ML PO CONC
4.0000 mg | Freq: Four times a day (QID) | ORAL | Status: DC
  Administered 2016-07-24 (×3): 4 mg via ORAL
  Filled 2016-07-24 (×4): qty 4

## 2016-07-24 NOTE — Progress Notes (Signed)
Speech Language Pathology Treatment:    Patient Details Name: Molly Black MRN: [id number] DOB: 06/04/1941 Today's Date: 07/24/2016 Time:  -     SLP paged afternoon of 12/28 that pt's family had question's re: swallow. SLP stopped by for less than 5 minutes (on my out of the bldg) and answered questions they had re: pt pocketing food and educated re: several strategies. They voiced understanding.   (No charge)                Houston Siren 07/24/2016, 12:17 PM

## 2016-07-24 NOTE — Care Management Note (Signed)
Case Management Note  Patient Details  Name: Molly Black MRN: [id number] Date of Birth: March 23, 1941  Subjective/Objective:                    Action/Plan: Pt discharging home with orders for [facility] services. CM met with the patient and her family and provided a list of [place] agencies. They selected [place]. Santiago Glad with [facility] notified and accepted the referral. No DME recommendations. Family states they are going to provide 24/7 supervision and they are going to transport her home.   Expected Discharge Date:                  Expected Discharge Plan:  [place]  In-House Referral:     Discharge planning Services  CM Consult  Post Acute Care Choice:  Home Health Choice offered to:  Patient (nephew/ niece)  DME Arranged:    DME Agency:     HH Arranged:  PT, Speech Therapy [place] Agency:  [place]  Status of Service:  Completed, signed off  If discussed at [place] of Stay Meetings, dates discussed:    Additional Comments:  Pollie Friar, RN 07/24/2016, 12:04 PM

## 2016-07-24 NOTE — Discharge Summary (Signed)
Physician Discharge Summary  Mckenleigh Tarlton [facility] OHY:[id number] DOB: 1941-06-29 DOA: 07/21/2016  PCP: Irven Shelling, MD  Admit date: 07/21/2016 Discharge date: 07/24/2016  Admitted From: home Disposition:  home  Recommendations for Outpatient Follow-up:  Follow up with Oncology, radiation oncology and neurosurgery next week as scheduled  Home Health: PT Equipment/Devices: None  Discharge Condition: Stable CODE STATUS: Full code Diet recommendation: Regular  HPI: Per Dr. Noah Delaine,  Molly Black is a 75 y.o. female with Stage IV (T2a, N3, M1 B) non-small cell lung cancer, adenocarcinoma, with right lower lobe lung mass, mediastinal and bilateral supraclavicular lymphadenopathy and metastasis to the left adrenal gland who is followed by Drs. Mohamed and Christmas.  She has been receiving chemoradiation with weekly carboplatin and paclitaxel. She completed stereotactic radiotherapy to her supraclavicular nodes and to her left adrenal metastasis.  She completed radiation in early December.  Overall she stated that she had felt better since starting chemotherapy and radiation. Her lymph nodes have shrunk and she was less short of breath. On Thursday, 6 days prior to admission, she developed some drooling and "swelling" of the left face. She did not notice any slurred speech however family noticed it yesterday and encouraged her to come to the emergency department. She denied any focal numbness, weakness of her left upper extremity, left lower extremity. She did not have any numbness of her left face.  She denied confusion. ED Course:  Vital signs stable, labs notable for white blood cell count 2.1, hemoglobin 11.9. Chemistry was within normal limits.  CT of the head was treated a new right anterior MCA distribution subacute stroke without hemorrhage.  The case was discussed with neurology who recommended admission to [facility]. The patient failed a stroke swallow screen. She is being  given rectal aspirin. She also had some witnessed spasming of the left side of her face that was concerning for possible partial seizure.    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 MRI was in fact negative for stroke, however it did show what looks like lung metastasis with moderate surrounding vasogenic edema. There also concern for seizure activity with left facial twitching, neurology was consulted and followed patient hospitalized. She was started on Vimpat, underwent an EEG which was negative. She was started on Decadron. Radiation oncology and neurosurgery were consulted. Patient underwent a repeat 3T MRI of the brain, and will be seen next week in radiation oncology office for stereotactic radiation. She was discharged home in stable condition, to continue Decadron until seen next week. She was also discharged on Vimpat per neurology recommendations and will need to follow-up with neurology as well for that Stage IV lung cancer with brain mets - Discussed with Dr. Julien Nordmann 12/27, looks like she has new brain metastasis with surrounding edema. Outpatient follow up next week Mild leukopenia and anemia - due to antineoplastic therapy, stable from prior. OSA - improving with weight loss, continue CPAP Radiation induced esophagitis and odynophagia - resolved Radiation-induced skin burn - She is currently applying Radiaplex EX (not on formulary)   Discharge Instructions  Discharge Instructions    Ambulatory referral  to Neurology    Complete by:  As directed    An appointment is requested in  approximately: 2 weeks   seizures     Allergies as of 07/24/2016   No Known Allergies     Medication List    TAKE these medications   aspirin 81 MG tablet Take 81 mg by mouth daily.   beta carotene w/minerals tablet Take 1 tablet by mouth daily.   CALCIUM 600+D PO Take by mouth 2 (two) times daily.   Cyanocobalamin 2500 MCG Tabs Take 1 tablet by mouth daily.   dexamethasone 1 MG/ML solution Commonly known as:  DECADRON Take 4 mLs (4 mg total) by mouth every 6 (six) hours.   feeding supplement Liqd Take 237 mLs by mouth AC breakfast. Takes occasionally   Fish Oil 1000 MG Caps Take 2,000 mg by mouth daily.   Lacosamide 100 MG Tabs Take 1 tablet (100 mg total) by mouth 2 (two) times daily.   multivitamin with minerals Tabs tablet Take 1 tablet by mouth daily.   omeprazole 40 MG capsule Commonly known as:  PRILOSEC Take 1 capsule (40 mg total) by mouth 2 (two) times daily. 30 minutes before breakfast and 30 minutes before dinner   RADIAPLEX EX Apply topically.   sucralfate 1 g tablet Commonly known as:  CARAFATE Take 1 tablet (1 g total) by mouth 4 (four) times daily.   SUPER PROBIOTIC PO Take 1 tablet by mouth daily.   Vitamin D3 3000 units Tabs Take 1 tablet by mouth daily.       No Known Allergies  Consultations:  Radiation oncology  Neurosurgery  Procedures/Studies:  2D echo  Dg Chest 2 View  Result Date: 07/21/2016 CLINICAL DATA:  Stroke.  Shortness of breath . EXAM: CHEST  2 VIEW COMPARISON:  PET-CT 05/22/2016. FINDINGS: Cardiomegaly. No pulmonary venous congestion. Low lung volumes. Mild right base subsegmental atelectasis noted. Mild infiltrate cannot be excluded. No definite infiltrate or mass lesion in the left lung base is noted on prior PET-CT. No pleural effusion or pneumothorax. Right axillary surgical clips noted. No acute bony abnormality . IMPRESSION: 1. Cardiomegaly.  No evidence of CHF. 2. Mild right base subsegmental  atelectasis. Mild infiltrate cannot be excluded. 3. Previously identified left lung base mass/ infiltrate noted on PET-CT of 05/22/2016 no longer identified. Follow-up chest CT can be obtained for confirmation. Electronically Signed   By: Marcello Moores  Register   On: 07/21/2016 14:41   Ct Head Wo Contrast  Result Date: 07/21/2016 CLINICAL DATA:  Slurred speech and left facial droop starting on Thursday. Left facial swelling. Left lung cancer. EXAM: CT HEAD WITHOUT CONTRAST TECHNIQUE: Contiguous axial images were obtained from the base of the skull through the vertex without intravenous contrast. COMPARISON:  05/22/2016 brain MRI. FINDINGS: Brain: New right anterior MCA distribution region of loss of gray-white junction compatible with subacute stroke, region of involvement 5.2 by 3.7 by 3.7 cm. No associated hemorrhage or other intracranial hemorrhage. Otherwise the brainstem, cerebellum, cerebral peduncles, thalami, basal ganglia, basilar cisterns appear unremarkable. No mass lesion identified. Vascular: Unremarkable Skull: Unremarkable Sinuses/Orbits: Mild chronic left maxillary and right posterior ethmoid sinusitis. Other: No supplemental non-categorized findings. IMPRESSION: 1. New right anterior MCA distribution subacute stroke with loss of gray-white differentiation and hypodensity in the region of involvement. No associated hemorrhage. 2. Minimal chronic paranasal sinusitis. These results were called by telephone at the time of interpretation on 07/21/2016 at 12:52 pm to Dr. Julianne Rice , who verbally acknowledged these results.  Electronically Signed   By: Van Clines M.D.   On: 07/21/2016 12:52   Mr Jeri Cos WU Contrast  Result Date: 07/24/2016 CLINICAL DATA:  Brain lesion.  Metastatic lung cancer.  SRS planning EXAM: MRI HEAD WITHOUT AND WITH CONTRAST TECHNIQUE: Multiplanar, multiecho pulse sequences of the brain and surrounding structures were obtained without and with intravenous contrast.  CONTRAST:  2m MULTIHANCE GADOBENATE DIMEGLUMINE 529 MG/ML IV SOLN COMPARISON:  07/22/2016 FINDINGS: Brain: Re- demonstrated 4 enhancing masses clustered in the right frontal lobe. These developed rapidly when compared with 05/22/2016 exam. 1. The largest most posterior and inferior right frontal mass measures smaller than on the previous study, although still has the same bilobed central architecture with more avidly enhancing rim. Differences may be from steroids and resolved extracellular leakage of contrast on the previous study. Contrast timing can affect degree of enhancement, but usually not too this degree, and the other lesions appear stable. Currently this mass measures 15 x 10 mm. 2. Right frontal white matter lesion measuring 8 mm, stable appearance from prior. 14:97 3. Higher and more superficial right frontal lobe lesion, 14:109, stable at 8 to 9 mm. 4. Closely contiguous to the third lesion is a 6 mm lesion which it appears discrete on reformats, stable. No newly identified lesion The decreased enhancement of the largest lesion and the clustering of lesions in the right frontal lobe is somewhat unusual, although finding still likely reflect metastatic lung cancer and treatment effect from steroids. Alternate diagnosis, such as lymphoma, is considered given the response steroids, diffusion hyperintensity, and T2 hypointensity. The appearance is aggressive/malignant; doubt an ischemic or inflammatory process. Vasogenic edema mainly associate with the largest lesion is similar to prior. Stable mild midline shift. No acute infarct. No suspected acute hemorrhage. There is minimal T1 hyperintensity associated with the largest right frontal mass, which could be proteinaceous or blood products. Vascular: Preserved flow voids. Skull and upper cervical spine: No focal lesion. Sinuses/Orbits: Negative for orbital mass. Bilateral cataract resection. Bilateral mucous retention cysts in the maxillary antra.  Attempted to discuss case by phone with Dr. MTammi Klippeland MJulien Nordmann but both are currently unavailable. IMPRESSION: 4 enhancing right frontal parenchymal lesions are re- demonstrated; no new lesion. Unexpected decreased enhancement of the largest lesion on the order of 1 cm, presumably a response to steroids. This unexpected change and clustering in the right frontal lobe broaden the differential considerations, although lung metastatic disease is still favored. Further discussion above. Electronically Signed   By: JMonte FantasiaM.D.   On: 07/24/2016 13:51   Mr BJeri CosWJWContrast  Result Date: 07/22/2016 CLINICAL DATA:  Lung cancer.  Slurred speech. EXAM: MRI HEAD WITHOUT AND WITH CONTRAST MRA HEAD WITHOUT CONTRAST TECHNIQUE: Multiplanar, multiecho pulse sequences of the brain and surrounding structures were obtained without and with intravenous contrast. Angiographic images of the head were obtained using MRA technique without contrast. CONTRAST:  162mMULTIHANCE GADOBENATE DIMEGLUMINE 529 MG/ML IV SOLN COMPARISON:  Head CT 07/21/2016 Brain MRI 05/22/2016 FINDINGS: MRI HEAD FINDINGS Brain: There are 4 distinct contrast-enhancing mass is within the right frontal lobe. The largest lesion is located within the posterolateral right frontal lobe with contrast-enhancing component measuring 2.0 x 1.0 cm with surrounding vasogenic edema. The 3 smaller lesions are located more anteriorly. The 2 larger lesions each measure approximately 8 x 8 mm; the smallest lesion measures 6 x 6 mm. There is mild surrounding vasogenic edema at the sites of these lesions. No other contrast-enhancing lesions are identified.  There is additional multifocal hyperintense T2-weighted signal within the periventricular white matter, most often seen in the setting of chronic microvascular ischemia. No hydrocephalus or extra-axial fluid collection. The midline structures are normal. No age advanced or lobar predominant atrophy. Vascular: Major  intracranial arterial and venous sinus flow voids are preserved. No evidence of chronic microhemorrhage or amyloid angiopathy. Skull and upper cervical spine: The visualized skull base, calvarium, upper cervical spine and extracranial soft tissues are normal. Sinuses/Orbits: Bilateral maxillary floor retention cysts. Normal orbits. MRA HEAD FINDINGS Intracranial internal carotid arteries: There is a small, inferiorly directed outpouching from the communicating segment of the right internal carotid artery that measures 2 mm base to apex and 2 mm at its base. The internal carotid arteries are otherwise normal. Anterior cerebral arteries: Normal. Middle cerebral arteries: Normal. Posterior communicating arteries: Absent bilaterally. Posterior cerebral arteries: Normal. Basilar artery: Normal. Vertebral arteries: Codominant. Normal. Superior cerebellar arteries: Normal. Anterior inferior cerebellar arteries: Normal on the left. Not clearly seen on the right. Posterior inferior cerebellar arteries: Right-dominant. IMPRESSION: 1. Multiple (4) new intraparenchymal metastases within the right frontal lobe. Moderate surrounding vasogenic edema, greatest at the largest, most posterolateral lesion. No herniation, significant mass effect or acute hemorrhage. 2. No acute ischemic infarct. 3. 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 4. No intracranial arterial occlusion or high-grade stenosis. Electronically Signed   By: Ulyses Jarred M.D.   On: 07/22/2016 05:33   Mr Jodene Nam Head/brain GE Cm  Result Date: 07/22/2016 CLINICAL DATA:  Lung cancer.  Slurred speech. EXAM: MRI HEAD WITHOUT AND WITH CONTRAST MRA HEAD WITHOUT CONTRAST TECHNIQUE: Multiplanar, multiecho pulse sequences of the brain and surrounding structures were obtained without and with intravenous contrast. Angiographic images of the head were obtained using MRA technique without contrast.  CONTRAST:  42m MULTIHANCE GADOBENATE DIMEGLUMINE 529 MG/ML IV SOLN COMPARISON:  Head CT 07/21/2016 Brain MRI 05/22/2016 FINDINGS: MRI HEAD FINDINGS Brain: There are 4 distinct contrast-enhancing mass is within the right frontal lobe. The largest lesion is located within the posterolateral right frontal lobe with contrast-enhancing component measuring 2.0 x 1.0 cm with surrounding vasogenic edema. The 3 smaller lesions are located more anteriorly. The 2 larger lesions each measure approximately 8 x 8 mm; the smallest lesion measures 6 x 6 mm. There is mild surrounding vasogenic edema at the sites of these lesions. No other contrast-enhancing lesions are identified. There is additional multifocal hyperintense T2-weighted signal within the periventricular white matter, most often seen in the setting of chronic microvascular ischemia. No hydrocephalus or extra-axial fluid collection. The midline structures are normal. No age advanced or lobar predominant atrophy. Vascular: Major intracranial arterial and venous sinus flow voids are preserved. No evidence of chronic microhemorrhage or amyloid angiopathy. Skull and upper cervical spine: The visualized skull base, calvarium, upper cervical spine and extracranial soft tissues are normal. Sinuses/Orbits: Bilateral maxillary floor retention cysts. Normal orbits. MRA HEAD FINDINGS Intracranial internal carotid arteries: There is a small, inferiorly directed outpouching from the communicating segment of the right internal carotid artery that measures 2 mm base to apex and 2 mm at its base. The internal carotid arteries are otherwise normal. Anterior cerebral arteries: Normal. Middle cerebral arteries: Normal. Posterior communicating arteries: Absent bilaterally. Posterior cerebral arteries: Normal. Basilar artery: Normal. Vertebral arteries: Codominant. Normal. Superior cerebellar arteries: Normal. Anterior inferior cerebellar arteries: Normal on the left. Not clearly seen on  the right. Posterior inferior cerebellar arteries: Right-dominant. IMPRESSION: 1. Multiple (4)  new intraparenchymal metastases within the right frontal lobe. Moderate surrounding vasogenic edema, greatest at the largest, most posterolateral lesion. No herniation, significant mass effect or acute hemorrhage. 2. No acute ischemic infarct. 3. 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 4. No intracranial arterial occlusion or high-grade stenosis. Electronically Signed   By: Ulyses Jarred M.D.   On: 07/22/2016 05:33      Subjective: - no chest pain, shortness of breath, no abdominal pain, nausea or vomiting.   Discharge Exam: Vitals:   07/24/16 0900 07/24/16 1313  BP: 128/69 122/74  Pulse: 86   Resp: 20 18  Temp:  98.2 F (36.8 C)   Vitals:   07/24/16 0138 07/24/16 0540 07/24/16 0900 07/24/16 1313  BP: 116/61 139/77 128/69 122/74  Pulse: 73 70 86   Resp: '18 18 20 18  '$ Temp: 97.8 F (36.6 C) 97.9 F (36.6 C)  98.2 F (36.8 C)  TempSrc: Oral Oral Axillary Oral  SpO2: 95% 97% 97% 98%  Weight:      Height:        General: Pt is alert, awake, not in acute distress Cardiovascular: RRR, S1/S2 +, no rubs, no gallops Respiratory: CTA bilaterally, no wheezing, no rhonchi Abdominal: Soft, NT, ND, bowel sounds +    The results of significant diagnostics from this hospitalization (including imaging, microbiology, ancillary and laboratory) are listed below for reference.     Microbiology: No results found for this or any previous visit (from the past 240 hour(s)).   Labs: BNP (last 3 results) No results for input(s): BNP in the last 8760 hours. Basic Metabolic Panel:  Recent Labs Lab 07/21/16 1157  NA 139  K 3.9  CL 107  CO2 24  GLUCOSE 88  BUN 17  CREATININE 0.66  CALCIUM 9.1   Liver Function Tests:  Recent Labs Lab 07/21/16 1157  AST 27  ALT 25  ALKPHOS 38  BILITOT 1.7*  PROT 6.6  ALBUMIN  3.9   No results for input(s): LIPASE, AMYLASE in the last 168 hours. No results for input(s): AMMONIA in the last 168 hours. CBC:  Recent Labs Lab 07/21/16 1157  WBC 3.1*  NEUTROABS 2.3  HGB 11.9*  HCT 33.5*  MCV 91.5  PLT 170   Cardiac Enzymes: No results for input(s): CKTOTAL, CKMB, CKMBINDEX, TROPONINI in the last 168 hours. BNP: Invalid input(s): POCBNP CBG: No results for input(s): GLUCAP in the last 168 hours. D-Dimer No results for input(s): DDIMER in the last 72 hours. Hgb A1c No results for input(s): HGBA1C in the last 72 hours. Lipid Profile No results for input(s): CHOL, HDL, LDLCALC, TRIG, CHOLHDL, LDLDIRECT in the last 72 hours. Thyroid function studies No results for input(s): TSH, T4TOTAL, T3FREE, THYROIDAB in the last 72 hours.  Invalid input(s): FREET3 Anemia work up No results for input(s): VITAMINB12, FOLATE, FERRITIN, TIBC, IRON, RETICCTPCT in the last 72 hours. Urinalysis No results found for: COLORURINE, APPEARANCEUR, LABSPEC, [place], GLUCOSEU, HGBUR, BILIRUBINUR, KETONESUR, PROTEINUR, UROBILINOGEN, NITRITE, LEUKOCYTESUR Sepsis Labs Invalid input(s): PROCALCITONIN,  WBC,  LACTICIDVEN Microbiology No results found for this or any previous visit (from the past 240 hour(s)).   Time coordinating discharge: Over 30 minutes  SIGNED:  Marzetta Board, MD  [facility] 07/24/2016, 4:30 PM Pager [phone]  If 7PM-7AM, please contact night-coverage [email or web address] Password [email or web address]

## 2016-07-24 NOTE — Discharge Planning (Addendum)
Patient and family decided to re-address POA changes at a bank instead.  Patient being wheeled to front and family transporting home via car.  Order DCd.

## 2016-07-24 NOTE — Discharge Planning (Signed)
IV and tele removed. CM contacting [employer] to possibly assist with updating Living Will and POA papers (per patient request). Patient educated on possible future s/sx of stroke and to call 911 if symptoms noted.  Emphasized that "time is brain." Patient and family expressed understanding. Informed of suggested FU appts and appts made.  Told of scripts sent to [facility].  RN assessment and VS revealed stability for DC to home with [facility].  Patient is leaving hospital in no pain. Once patient is ready, will be wheeled to front and family transporting via car.

## 2016-07-24 NOTE — Progress Notes (Signed)
Occupational Therapy Treatment Patient Details Name: Molly Black MRN: [id number] DOB: [birth year] Today's Date: 07/24/2016    History of present illness Molly Black is a 75 y.o. 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 Multiple (4) new intraparenchymal metastases R frontal lobe.   OT comments  Pt will have (A) of x3 family members upon d/c home. Pt reports eagerness to leave and to be a home. Pt currently abel to demonstrate dressing and transfer with family members this session.   Follow Up Recommendations  Home health OT;Supervision/Assistance - 24 hour    Equipment Recommendations  None recommended by OT    Recommendations for Other Services      Precautions / Restrictions Precautions Precautions: Fall       Mobility Bed Mobility               General bed mobility comments: in chair on arrival  Transfers                      Balance                                   ADL Overall ADL's : Needs assistance/impaired     Grooming: Supervision/safety Grooming Details (indicate cue type and reason): application of chapstick. incr time to take top off Upper Body Bathing: Supervision/ safety   Lower Body Bathing: Minimal assistance Lower Body Bathing Details (indicate cue type and reason): require (A) for socks from family present         Toilet Transfer: Supervision/safety             General ADL Comments: education to family and patient on sound amplifer to help with hearing patients voice. sister in law reports "I can't here her" Plans to purchase item because she states "i can use this with my daughter talking too. I cant hear them anymore. "  Pt eager to d/c at this time      Vision                      Perception     Praxis      Cognition   Behavior During Therapy: [facility] for tasks assessed/performed Overall Cognitive Status: Within Functional Limits for tasks assessed                       Extremity/Trunk Assessment               Exercises     Shoulder Instructions       General Comments      Pertinent Vitals/ Pain       Pain Assessment: No/denies pain  Home Living                                          Prior Functioning/Environment              Frequency  Min 2X/week        Progress Toward Goals  OT Goals(current goals can now be found in the care plan section)  Progress towards OT  goals: Progressing toward goals  Acute Rehab OT Goals Patient Stated Goal: did not state OT Goal Formulation: With patient Time For Goal Achievement: 07/29/16 Potential to Achieve Goals: Good ADL Goals Pt Will Perform Eating: Independently;sitting Pt Will Perform Grooming: with modified independence;standing Pt Will Perform Lower Body Bathing: with modified independence;sit to/from stand Pt Will Perform Lower Body Dressing: with modified independence;sit to/from stand Pt Will Transfer to Toilet: with modified independence;ambulating;regular height toilet Pt Will Perform Toileting - Clothing Manipulation and hygiene: with modified independence;sit to/from stand Pt/caregiver will Perform Home Exercise Program: Increased strength;Left upper extremity;Independently Additional ADL Goal #1: Pt and family will be aware of safety issues requiring supervision during IADL.  Plan Discharge plan remains appropriate    Co-evaluation                 End of Session     Activity Tolerance Patient tolerated treatment well   Patient Left in chair   Nurse Communication Mobility status;Precautions        Time: 1347-1400 OT Time Calculation (min): 13 min  Charges: OT Treatments $Self Care/Home Management : 8-22  mins  Parke Poisson B 07/24/2016, 2:49 PM   Jeri Modena   OTR/L Pager: [phone] Office: [phone] .

## 2016-07-28 ENCOUNTER — Ambulatory Visit
Admission: RE | Admit: 2016-07-28 | Discharge: 2016-07-28 | Disposition: A | Discharge status: Home / Self Care | Payer: Medicare Other | Source: Ambulatory Visit | Attending: Radiation Oncology | Admitting: Radiation Oncology

## 2016-07-28 DIAGNOSIS — C3492 Malignant neoplasm of unspecified part of left bronchus or lung: Secondary | ICD-10-CM | POA: Diagnosis present

## 2016-07-28 DIAGNOSIS — Z510 Encounter for antineoplastic radiation therapy: Secondary | ICD-10-CM | POA: Diagnosis not present

## 2016-07-28 DIAGNOSIS — C7931 Secondary malignant neoplasm of brain: Secondary | ICD-10-CM

## 2016-07-28 DIAGNOSIS — C7972 Secondary malignant neoplasm of left adrenal gland: Secondary | ICD-10-CM | POA: Diagnosis not present

## 2016-07-28 MED ORDER — SODIUM CHLORIDE 0.9% FLUSH
10.0000 mL | INTRAVENOUS | Status: AC
  Administered 2016-07-28: 10 mL via INTRAVENOUS

## 2016-07-28 NOTE — Progress Notes (Signed)
Does patient have an allergy to IV contrast dye?: No   Has patient ever received premedication for IV contrast dye?: No.   Does patient take metformin?: No.  If patient does take metformin when was the last dose: N/A  Date of lab work: 07/21/2016 BUN: 17 CR: 0.66  IV site: left AC 22 gauge IV. Flushed without difficulty. Excellent blood return obtained. Patient tolerated well.   Following simulation [place], RT removed IV. She reported the IV catheter was intact upon removal. She reports applying a bandaid to the old IV site. She reports the patient tolerated this well.

## 2016-07-28 NOTE — Progress Notes (Signed)
  Radiation Oncology         (336) [phone] ________________________________  Name: Molly Black [facility] MRN: [id number]  Date: 07/28/2016  DOB: Apr 24, 1941  SIMULATION AND TREATMENT PLANNING NOTE    ICD-9-CM ICD-10-CM   1. 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  Identity was confirmed.  All relevant records and images related to the planned course of therapy were reviewed.  The patient freely provided informed written consent to proceed with treatment after reviewing the details related to the planned course of therapy. The consent form was witnessed and verified by the simulation staff. Intravenous access was established for contrast administration. Then, the patient was set-up in a stable reproducible supine position for radiation therapy.  A relocatable thermoplastic stereotactic head frame was fabricated for precise immobilization.  CT images were obtained.  Surface markings were placed.  The CT images were loaded into the planning software and fused with the patient's targeting MRI scan.  Then the target and avoidance structures were contoured.  Treatment planning then occurred.  The radiation prescription was entered and confirmed.  I have requested 3D planning  I have requested a DVH of the following structures: Brain stem, brain, left eye, right eye, lenses, optic chiasm, target volumes, uninvolved brain, and normal tissue.    SPECIAL TREATMENT PROCEDURE:  The planned course of therapy using radiation constitutes a special treatment procedure. Special care is required in the management of this patient for the following reasons. This treatment constitutes a Special Treatment Procedure for the following reason: High dose per fraction requiring special monitoring for increased toxicities of treatment including daily  imaging.  The special nature of the planned course of radiotherapy will require increased physician supervision and oversight to ensure patient's safety with optimal treatment outcomes.  PLAN:  The patient will receive 20 Gy in 1 fraction to each of the 4 metastases.  ________________________________  Sheral Apley Tammi Klippel, M.D.

## 2016-07-29 ENCOUNTER — Encounter (HOSPITAL_COMMUNITY): Payer: Self-pay

## 2016-07-29 ENCOUNTER — Other Ambulatory Visit (HOSPITAL_BASED_OUTPATIENT_CLINIC_OR_DEPARTMENT_OTHER): Payer: Medicare Other

## 2016-07-29 ENCOUNTER — Ambulatory Visit (HOSPITAL_COMMUNITY)
Admission: RE | Admit: 2016-07-29 | Discharge: 2016-07-29 | Disposition: A | Discharge status: Home / Self Care | Payer: Medicare Other | Source: Ambulatory Visit | Attending: Internal Medicine | Admitting: Internal Medicine

## 2016-07-29 DIAGNOSIS — R1310 Dysphagia, unspecified: Secondary | ICD-10-CM | POA: Diagnosis present

## 2016-07-29 DIAGNOSIS — I2510 Atherosclerotic heart disease of native coronary artery without angina pectoris: Secondary | ICD-10-CM | POA: Insufficient documentation

## 2016-07-29 DIAGNOSIS — K5730 Diverticulosis of large intestine without perforation or abscess without bleeding: Secondary | ICD-10-CM | POA: Insufficient documentation

## 2016-07-29 DIAGNOSIS — T300 Burn of unspecified body region, unspecified degree: Secondary | ICD-10-CM | POA: Diagnosis not present

## 2016-07-29 DIAGNOSIS — C7972 Secondary malignant neoplasm of left adrenal gland: Secondary | ICD-10-CM

## 2016-07-29 DIAGNOSIS — C3491 Malignant neoplasm of unspecified part of right bronchus or lung: Secondary | ICD-10-CM | POA: Diagnosis not present

## 2016-07-29 DIAGNOSIS — Z5111 Encounter for antineoplastic chemotherapy: Secondary | ICD-10-CM

## 2016-07-29 DIAGNOSIS — C801 Malignant (primary) neoplasm, unspecified: Principal | ICD-10-CM

## 2016-07-29 DIAGNOSIS — I700 Atherosclerosis of aorta: Secondary | ICD-10-CM | POA: Diagnosis not present

## 2016-07-29 DIAGNOSIS — C3431 Malignant neoplasm of lower lobe, right bronchus or lung: Secondary | ICD-10-CM | POA: Diagnosis not present

## 2016-07-29 HISTORY — DX: Secondary malignant neoplasm of brain: C79.31

## 2016-07-29 LAB — CBC WITH DIFFERENTIAL/PLATELET
BASO%: 0 % (ref 0.0–2.0)
BASOS ABS: 0 10*3/uL (ref 0.0–0.1)
EOS ABS: 0 10*3/uL (ref 0.0–0.5)
EOS%: 0 % (ref 0.0–7.0)
HEMATOCRIT: 39.6 % (ref 34.8–46.6)
HEMOGLOBIN: 14 g/dL (ref 11.6–15.9)
LYMPH#: 0.8 10*3/uL — AB (ref 0.9–3.3)
LYMPH%: 12.6 % — ABNORMAL LOW (ref 14.0–49.7)
MCH: 32.9 pg (ref 25.1–34.0)
MCHC: 35.4 g/dL (ref 31.5–36.0)
MCV: 93 fL (ref 79.5–101.0)
MONO#: 0.3 10*3/uL (ref 0.1–0.9)
MONO%: 4 % (ref 0.0–14.0)
NEUT#: 5.2 10*3/uL (ref 1.5–6.5)
NEUT%: 83.4 % — ABNORMAL HIGH (ref 38.4–76.8)
Platelets: 223 10*3/uL (ref 145–400)
RBC: 4.26 10*6/uL (ref 3.70–5.45)
RDW: 17.8 % — AB (ref 11.2–14.5)
WBC: 6.2 10*3/uL (ref 3.9–10.3)

## 2016-07-29 LAB — COMPREHENSIVE METABOLIC PANEL
ALBUMIN: 4 g/dL (ref 3.5–5.0)
ALK PHOS: 45 U/L (ref 40–150)
ALT: 23 U/L (ref 0–55)
AST: 16 U/L (ref 5–34)
Anion Gap: 11 mEq/L (ref 3–11)
BILIRUBIN TOTAL: 1.93 mg/dL — AB (ref 0.20–1.20)
BUN: 31.1 mg/dL — AB (ref 7.0–26.0)
CO2: 26 mEq/L (ref 22–29)
Calcium: 10 mg/dL (ref 8.4–10.4)
Chloride: 102 mEq/L (ref 98–109)
Creatinine: 0.7 mg/dL (ref 0.6–1.1)
EGFR: 86 mL/min/{1.73_m2} — AB (ref >90)
Glucose: 87 mg/dl (ref 70–140)
POTASSIUM: 4.4 meq/L (ref 3.5–5.1)
Sodium: 139 mEq/L (ref 136–145)
TOTAL PROTEIN: 6.7 g/dL (ref 6.4–8.3)

## 2016-07-29 LAB — TECHNOLOGIST REVIEW

## 2016-07-29 MED ORDER — IOPAMIDOL (ISOVUE-300) INJECTION 61%
INTRAVENOUS | Status: AC
  Administered 2016-07-29: 100 mL
  Filled 2016-07-29: qty 100

## 2016-07-31 ENCOUNTER — Encounter: Payer: Self-pay | Admitting: Radiation Oncology

## 2016-07-31 ENCOUNTER — Ambulatory Visit
Admission: RE | Admit: 2016-07-31 | Discharge: 2016-07-31 | Disposition: A | Discharge status: Home / Self Care | Payer: Medicare Other | Source: Ambulatory Visit | Attending: Radiation Oncology | Admitting: Radiation Oncology

## 2016-07-31 VITALS — BP 141/88 | HR 66 | Temp 97.7°F | Resp 16

## 2016-07-31 DIAGNOSIS — C7931 Secondary malignant neoplasm of brain: Secondary | ICD-10-CM

## 2016-07-31 DIAGNOSIS — F411 Generalized anxiety disorder: Secondary | ICD-10-CM

## 2016-07-31 DIAGNOSIS — Z510 Encounter for antineoplastic radiation therapy: Secondary | ICD-10-CM | POA: Diagnosis not present

## 2016-07-31 MED ORDER — LORAZEPAM 1 MG PO TABS
1.0000 mg | ORAL_TABLET | ORAL | Status: AC
  Administered 2016-07-31: 1 mg via ORAL
  Filled 2016-07-31: qty 1

## 2016-07-31 NOTE — Progress Notes (Signed)
With the aid of Ativan patient was able to tolerate and complete SRS treatment. Received patient in the clinic following treatment accompanied by several family members for nurse monitoring. Vitals stable. Denies pain. Denies headache, dizziness, nausea, diplopia or ringing in the ears. Patient understands to avoid strenuous activity for the next 24 hours and call our on call physician [phone] with needs. Patient has appointment card for next visit with our office.

## 2016-07-31 NOTE — Op Note (Signed)
Stereotactic Radiosurgery Operative Note  Name: Molly Black MRN: [id number]  Date: 07/31/2016  DOB: 04/18/1941  Op Note  Pre Operative Diagnosis:  4 right frontal metastases from metastatic lung cancer  Post Operative Diagnois:  4 right frontal metastases from metastatic lung cancer  3D TREATMENT PLANNING AND DOSIMETRY:  The patient's radiation plan was reviewed and approved by myself (neurosurgery) and Dr. Ledon Snare (radiation oncology) prior to treatment.  It showed 3-dimensional radiation distributions overlaid onto the planning CT/MRI image set.  The [facility] for the target structures as well as the organs at risk were reviewed. The documentation of the 3D plan and dosimetry are filed in the radiation oncology EMR.  NARRATIVE:  Molly Black Molly Black was brought to the TrueBeam stereotactic radiation treatment machine and placed supine on the CT couch. The head frame was applied, and the patient was set up for stereotactic radiosurgery.  I was present for the set-up and delivery.  SIMULATION VERIFICATION:  In the couch zero-angle position, the patient underwent Exactrac imaging using the Brainlab system with orthogonal KV images.  These were carefully aligned and repeated to confirm treatment position for each of the isocenters.  The Exactrac snap film verification was repeated at each couch angle.  SPECIAL TREATMENT PROCEDURE: Molly Black received stereotactic radiosurgery to the following targets: 4 simple right frontal targets were treated using a multitarget single isocenter plan with 10 Dynamic Conformal Arcs to a prescription dose of 20 Gy for each target.  ExacTrac registration was performed for each couch angle.  The 100% isodose line was prescribed.  STEREOTACTIC TREATMENT MANAGEMENT:  Following delivery, the patient was transported to nursing in stable condition and monitored for possible acute effects.  The patient tolerated treatment without significant acute effects, and was  discharged to home in stable condition.    PLAN: Follow-up in one month.

## 2016-07-31 NOTE — Progress Notes (Signed)
Patient uncertain if she will be able to lay still on the treatment table. Also, patient expresses anxiety surround treatment. Attempted to comfort patient. Administered 1 mg Ativan SL per Dr. Johny Shears order.

## 2016-07-31 NOTE — Progress Notes (Signed)
  Radiation Oncology         (336) [phone] ________________________________  Stereotactic Treatment Procedure Note  Name: Molly Black MRN: [id number]  Date: 07/31/2016  DOB: [birth year]  SPECIAL TREATMENT PROCEDURE    ICD-9-CM ICD-10-CM   1. Anxiety state 300.00 F41.1 LORazepam (ATIVAN) tablet 1 mg  2. Brain metastases (H[facility]) 198.3 C79.31 LORazepam (ATIVAN) tablet 1 mg    3D TREATMENT PLANNING AND DOSIMETRY:  The patient's radiation plan was reviewed and approved by neurosurgery and radiation oncology prior to treatment.  It showed 3-dimensional radiation distributions overlaid onto the planning CT/MRI image set.  The [facility] for the target structures as well as the organs at risk were reviewed. The documentation of the 3D plan and dosimetry are filed in the radiation oncology EMR.  NARRATIVE:  [facility]olly Black [facility] was brought to the TrueBeam stereotactic radiation treatment machine and placed supine on the CT couch. The head frame was applied, and the patient was set up for stereotactic radiosurgery.  Neurosurgery was present for the set-up and delivery  SIMULATION VERIFICATION:  In the couch zero-angle position, the patient underwent Exactrac imaging using the Brainlab system with orthogonal KV images.  These were carefully aligned and repeated to confirm treatment position for each of the isocenters.  The Exactrac snap film verification was repeated at each couch angle.  PROCEDURE: [name] received stereotactic radiosurgery to the following targets: The four right frontal metastases were treated using 4 Dynamic Conformal Arcs to a prescription dose of 20 Gy.  ExacTrac registration was performed for each couch angle.  The 100% isodose line was prescribed.  6 MV X-rays were delivered in the flattening filter free beam mode.  STEREOTACTIC TREATMENT MANAGEMENT:  Following delivery, the patient was transported to nursing in stable condition and monitored for possible acute effects.  Vital  signs were recorded BP (!) 141/88 (BP Location: Right Arm, Patient Position: Sitting, Cuff Size: Normal)   Pulse 66   Temp 97.7 F (36.5 C) (Oral)   Resp 16   SpO2 100% . The patient tolerated treatment without significant acute effects, and was discharged to home in stable condition.    PLAN: Follow-up in one month.  ________________________________  Sheral Apley. Tammi Klippel, M.D.

## 2016-08-03 ENCOUNTER — Telehealth: Payer: Self-pay | Admitting: Internal Medicine

## 2016-08-03 ENCOUNTER — Other Ambulatory Visit: Payer: Self-pay | Admitting: Medical Oncology

## 2016-08-03 ENCOUNTER — Other Ambulatory Visit: Payer: Self-pay | Admitting: Radiology

## 2016-08-03 ENCOUNTER — Encounter: Payer: Self-pay | Admitting: Internal Medicine

## 2016-08-03 ENCOUNTER — Ambulatory Visit (HOSPITAL_BASED_OUTPATIENT_CLINIC_OR_DEPARTMENT_OTHER): Payer: Medicare Other | Admitting: Internal Medicine

## 2016-08-03 ENCOUNTER — Encounter: Payer: Self-pay | Admitting: *Deleted

## 2016-08-03 VITALS — BP 129/66 | HR 65 | Temp 97.9°F | Resp 18 | Ht 62.0 in | Wt 165.1 lb

## 2016-08-03 DIAGNOSIS — C801 Malignant (primary) neoplasm, unspecified: Principal | ICD-10-CM

## 2016-08-03 DIAGNOSIS — G4089 Other seizures: Secondary | ICD-10-CM | POA: Diagnosis not present

## 2016-08-03 DIAGNOSIS — R1310 Dysphagia, unspecified: Secondary | ICD-10-CM

## 2016-08-03 DIAGNOSIS — I878 Other specified disorders of veins: Secondary | ICD-10-CM

## 2016-08-03 DIAGNOSIS — C7931 Secondary malignant neoplasm of brain: Secondary | ICD-10-CM | POA: Diagnosis not present

## 2016-08-03 DIAGNOSIS — C7972 Secondary malignant neoplasm of left adrenal gland: Secondary | ICD-10-CM | POA: Diagnosis not present

## 2016-08-03 DIAGNOSIS — C3431 Malignant neoplasm of lower lobe, right bronchus or lung: Secondary | ICD-10-CM | POA: Diagnosis not present

## 2016-08-03 DIAGNOSIS — C3491 Malignant neoplasm of unspecified part of right bronchus or lung: Secondary | ICD-10-CM

## 2016-08-03 DIAGNOSIS — Z7189 Other specified counseling: Secondary | ICD-10-CM | POA: Insufficient documentation

## 2016-08-03 HISTORY — DX: Other specified counseling: Z71.89

## 2016-08-03 MED ORDER — FOLIC ACID 1 MG PO TABS: 1.0000 mg | ORAL_TABLET | Freq: Every day | ORAL | 4 refills | Status: DC

## 2016-08-03 MED ORDER — CYANOCOBALAMIN 1000 MCG/ML IJ SOLN
1000.0000 ug | Freq: Once | INTRAMUSCULAR | Status: AC
  Administered 2016-08-03: 1000 ug via INTRAMUSCULAR

## 2016-08-03 MED ORDER — DEXAMETHASONE 4 MG PO TABS: ORAL_TABLET | ORAL | 1 refills | Status: DC

## 2016-08-03 NOTE — Progress Notes (Signed)
Oncology Nurse Navigator Documentation  Oncology Nurse Navigator Flowsheets 08/03/2016  Navigator Location [facility]  Navigator Encounter Type Clinic/MDC/spoke with patient and family at [facility] today.  She will be starting a new treatment regimen.  I gave and explained information on her treatment.  Patient has great support but feel she needs discussion on realistic expectation due to incurrable disease.  I reached out to CSW to have more discussion.   Treatment Phase Pre-Tx/Tx Discussion  Barriers/Navigation Needs Education  Education Other  Interventions Education  Education Method Verbal;Written  Acuity Level 2  Time Spent with Patient 30

## 2016-08-03 NOTE — Telephone Encounter (Signed)
Appointments scheduled per 1/8 LOS. Patient given AVS report and calendars with future scheduled appointments.  °

## 2016-08-03 NOTE — Progress Notes (Signed)
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 bronchial wall thickening with mild centrilobular emphysema. Scattered areas of mild cylindrical bronchiectasis are noted, most evident in the right lower lobe. No acute consolidative airspace disease. No pleural effusions. Musculoskeletal: Status post right modified radical mastectomy and breast reconstruction with a subpectoral implant. Surgical clips in the right axilla from prior lymph node dissection. There are no aggressive appearing lytic or blastic lesions noted in the visualized portions of the skeleton. CT ABDOMEN PELVIS FINDINGS Hepatobiliary: Several well-defined low-attenuation lesions (largest of which are in segments 1 and 4 A) are compatible with simple cysts. 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 No other suspicious appearing hepatic lesions are noted. Status post cholecystectomy. Pancreas: No pancreatic mass. No pancreatic ductal dilatation. No pancreatic or peripancreatic fluid or inflammatory changes. Spleen: Unremarkable. Adrenals/Urinary Tract: 1.2 cm nodule in the medial limb of the left adrenal gland is indeterminate, but was markedly  hypermetabolic on prior PET-CT, presumably a metastatic lesion. Right adrenal gland and bilateral kidneys are normal in appearance. There is no hydroureteronephrosis. Urinary bladder is normal in appearance. Stomach/Bowel: The appearance of the stomach is normal. There is no pathologic dilatation of small bowel or colon. Numerous colonic diverticulae are present, without surrounding inflammatory changes to suggest an acute diverticulitis at this time. The appendix is not confidently identified and may be surgically absent. Regardless, there are no inflammatory changes noted adjacent to the cecum to suggest the presence of an acute appendicitis at this time. Vascular/Lymphatic: Aortic atherosclerosis, without evidence of aneurysm or dissection in the abdominal or pelvic vasculature. No lymphadenopathy noted in the abdomen or pelvis. Reproductive: Status post hysterectomy. Ovaries are not confidently identified may be surgically absent or atrophic. Other: No significant volume of ascites.  No pneumoperitoneum. Musculoskeletal: Grade 1 anterolisthesis of L4 upon L5. There are no aggressive appearing lytic or blastic lesions noted in the visualized portions of the skeleton. IMPRESSION: 1. Today's study demonstrates a positive response to therapy as evidenced by regression of the primary left lower lobe nodule, regression of right supraclavicular and mediastinal lymphadenopathy, and stability of the left adrenal nodule which is presumably metastatic. No new sites of metastatic disease are otherwise noted in the chest, abdomen or pelvis. 2. Aortic atherosclerosis, in addition to 2 vessel coronary artery disease. Assessment for potential risk factor modification, dietary therapy or pharmacologic therapy may be warranted, if clinically indicated. 3. Colonic diverticulosis without evidence of acute diverticulitis at this time. 4. Additional incidental findings, as above. Electronically Signed   By: Vinnie Langton M.D.    On: 07/29/2016 15:20   Mr Jodene Nam Head/brain QJ Cm  Result Date: 07/22/2016 CLINICAL DATA:  Lung cancer.  Slurred speech. EXAM: MRI HEAD WITHOUT AND WITH CONTRAST MRA HEAD WITHOUT CONTRAST TECHNIQUE: Multiplanar, multiecho pulse  sequences of the brain and surrounding structures were obtained without and with intravenous contrast. Angiographic images of the head were obtained using MRA technique without contrast. CONTRAST:  65m MULTIHANCE GADOBENATE DIMEGLUMINE 529 MG/ML IV SOLN COMPARISON:  Head CT 07/21/2016 Brain MRI 05/22/2016 FINDINGS: MRI HEAD FINDINGS Brain: There are 4 distinct contrast-enhancing mass is within the right frontal lobe. The largest lesion is located within the posterolateral right frontal lobe with contrast-enhancing component measuring 2.0 x 1.0 cm with surrounding vasogenic edema. The 3 smaller lesions are located more anteriorly. The 2 larger lesions each measure approximately 8 x 8 mm; the smallest lesion measures 6 x 6 mm. There is mild surrounding vasogenic edema at the sites of these lesions. No other contrast-enhancing lesions are identified. There is additional multifocal hyperintense T2-weighted signal within the periventricular white matter, most often seen in the setting of chronic microvascular ischemia. No hydrocephalus or extra-axial fluid collection. The midline structures are normal. No age advanced or lobar predominant atrophy. Vascular: Major intracranial arterial and venous sinus flow voids are preserved. No evidence of chronic microhemorrhage or amyloid angiopathy. Skull and upper cervical spine: The visualized skull base, calvarium, upper cervical spine and extracranial soft tissues are normal. Sinuses/Orbits: Bilateral maxillary floor retention cysts. Normal orbits. MRA HEAD FINDINGS Intracranial internal carotid arteries: There is a small, inferiorly directed outpouching from the communicating segment of the right internal carotid artery that measures 2 mm base to apex  and 2 mm at its base. The internal carotid arteries are otherwise normal. Anterior cerebral arteries: Normal. Middle cerebral arteries: Normal. Posterior communicating arteries: Absent bilaterally. Posterior cerebral arteries: Normal. Basilar artery: Normal. Vertebral arteries: Codominant. Normal. Superior cerebellar arteries: Normal. Anterior inferior cerebellar arteries: Normal on the left. Not clearly seen on the right. Posterior inferior cerebellar arteries: Right-dominant. IMPRESSION: 1. Multiple (4) new intraparenchymal metastases within the right frontal lobe. Moderate surrounding vasogenic edema, greatest at the largest, most posterolateral lesion. No herniation, significant mass effect or acute hemorrhage. 2. No acute ischemic infarct. 3. 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 4. No intracranial arterial occlusion or high-grade stenosis. Electronically Signed   By: KUlyses JarredM.D.   On: 07/22/2016 05:33    ASSESSMENT AND PLAN: This is a very pleasant 76years old white female with metastatic non-small cell lung cancer, adenocarcinoma. She completed a course of concurrent chemoradiation to the locally advanced disease in the chest as well as a stereotactic radiotherapy to a solitary adrenal gland lesion. She tolerated her treatment well. The patient also recently developed metastatic brain lesion is status post stereotactic radiotherapy to the 4 brain lesions. She had repeat CT scan of the chest abdomen and pelvis performed recently. I personally and independently reviewed the scan images and discuss the results with the patient and her family. Her recent scan showed improvement of her disease in the chest with a stable adrenal metastasis. I have a lengthy discussion with the patient and her family about her treatment options and goals of care. I explained to the patient that she has incurable condition and also  treatment will be of palliative nature. I gave the patient the option of palliative care versus palliative systemic chemotherapy with carboplatin for AUC of 5, Alimta 500 MG/M2 and Avastin 15 MG/KG every 3 weeks. The patient is interested in proceeding with the systemic chemotherapy. I discussed with the patient adverse effects of this treatment including but not limited to alopecia, myelosuppression, nausea and vomiting,  peripheral neuropathy, liver or renal dysfunction in addition to the adverse effect of Avastin including risk for pulmonary hemorrhage, GI perforation and wound healing delay. We will arrange for the patient to receive vitamin B 12 injection today. The patient would also receive prescription for Decadron 4 mg by mouth twice a day, the day before, day of and day after the chemotherapy in addition to folic acid 1 mg by mouth daily. She is expected to start the first cycle of this treatment on 08/10/2016. For the patient metastatic brain lesion, she underwent stereotactic radiotherapy and she is currently on a tapered dose of Decadron. I will see the patient back for follow-up visit in 4 weeks with the start of cycle #2. For the radiotherapy induced odynophagia, she will continue on treatment with Carafate and pain medication. For the recent diagnosis of seizure, the patient will continue valproic acid and she is followed by neurology. The patient was advised to call immediately if she has any concerning symptoms in the interval. The patient voices understanding of current disease status and treatment options and is in agreement with the current care plan.  All questions were answered. The patient knows to call the clinic with any problems, questions or concerns. We can certainly see the patient much sooner if necessary.  Disclaimer: This note was dictated with voice recognition software. Similar sounding words can inadvertently be transcribed and may not be corrected upon review.

## 2016-08-03 NOTE — Progress Notes (Signed)
DISCONTINUE ON PATHWAY REGIMEN - Non-Small Cell Lung  [id number]: Carboplatin AUC=2 + Paclitaxel 45 mg/m2 Weekly During Radiation   Administer weekly:     Paclitaxel (Taxol(R)) 45 mg/m2 in 250 mL NS IV over 1 hour followed by Dose Mod: None     Carboplatin (Paraplatin(R)) AUC=2 in 100 mL NS IV over 30 minutes Dose Mod: None Additional Orders: * All AUC calculations intended to be used in [employer] formula  **Always confirm dose/schedule in your pharmacy ordering system**    REASON: Disease Progression PRIOR TREATMENT: [id number]: Carboplatin AUC=2 + Paclitaxel 45 mg/m2 Weekly During Radiation TREATMENT RESPONSE: Progressive Disease (PD)  START ON PATHWAY REGIMEN - Non-Small Cell Lung  [id number]: Carboplatin AUC=5 + Pemetrexed 500 mg/m2 + Bevacizumab 15 mg/kg q21 Days x 4 Cycles   A cycle is every 21 days:     Carboplatin (Paraplatin(R)) AUC=5 in 250 mL NS IV over 1 hour Dose Mod: None     Pemetrexed (Alimta(R)) 500 mg/m2 in 100 mL NS IV over 10 minutes, manufacturer recommends not administering to patients with CrCl < 45 mL/min Dose Mod: None     Bevacizumab (Avastin(R)) 15 mg/kg in 100 mL NS IV over 90 minutes first infusion, 60 minutes second infusion and 30 minutes all subsequent infusions if tolerated Dose Mod: None Additional Orders: * All AUC calculations intended to be used in [employer] formula Note: Patient to receive the following prior to the initiation of therapy: 1) Dexamethasone 4 mg orally twice daily x 6 doses.  First dose 24 hours before chemotherapy. 2) Folic acid >= 034 mcg orally daily.  First dose at least 5 days prior to the first dose of pemetrexed. 3) Vitamin B12 1,000 mcg intramuscularly every 9 weeks.  First dose at least 5 days prior to the first dose of pemetrexed.  **Always confirm dose/schedule in your pharmacy ordering system**    Patient Characteristics: Stage IV Metastatic, Non Squamous, Initial Chemotherapy/Immunotherapy, PS = 0, 1, PD-L1 Expression Positive  1-49% (TPS) / Negative / Not Tested / Not a Candidate for Immunotherapy Check here if patient was staged using an edition prior to AJCC Staging - 8th Edition (i.e., prior to July 27, 2016)? false AJCC T Category: T2a Current Disease Status: Distant Metastases AJCC N Category: N3 AJCC M Category: M1c AJCC 8 Stage Grouping: IVB Histology: Non Squamous Cell ROS1 Rearrangement Status: Negative T790M Mutation Status: Not Applicable - EGFR Mutation Negative/Unknown Other Mutations/Biomarkers: No Other Actionable Mutations PD-L1 Expression Status: Quantity Not Sufficient Chemotherapy/Immunotherapy LOT: Initial Chemotherapy/Immunotherapy Molecular Targeted Therapy: Not Appropriate ALK Translocation Status: Negative Would you be surprised if this patient died  in the next year? I would NOT be surprised if this patient died in the next year EGFR Mutation Status: Negative/Wild Type BRAF V600E Mutation Status: Negative Performance Status: PS = 0, 1  Intent of Therapy: Non-Curative / Palliative Intent, Discussed with Patient

## 2016-08-04 ENCOUNTER — Ambulatory Visit (HOSPITAL_COMMUNITY)
Admission: RE | Admit: 2016-08-04 | Discharge: 2016-08-04 | Disposition: A | Discharge status: Home / Self Care | Payer: Medicare Other | Source: Ambulatory Visit | Attending: Internal Medicine | Admitting: Internal Medicine

## 2016-08-04 ENCOUNTER — Telehealth: Payer: Self-pay | Admitting: Medical Oncology

## 2016-08-04 ENCOUNTER — Other Ambulatory Visit: Payer: Self-pay | Admitting: Medical Oncology

## 2016-08-04 ENCOUNTER — Other Ambulatory Visit: Payer: Self-pay | Admitting: Internal Medicine

## 2016-08-04 ENCOUNTER — Encounter (HOSPITAL_COMMUNITY): Payer: Self-pay

## 2016-08-04 DIAGNOSIS — Z86010 Personal history of colonic polyps: Secondary | ICD-10-CM | POA: Diagnosis not present

## 2016-08-04 DIAGNOSIS — G4733 Obstructive sleep apnea (adult) (pediatric): Secondary | ICD-10-CM | POA: Diagnosis not present

## 2016-08-04 DIAGNOSIS — Z7982 Long term (current) use of aspirin: Secondary | ICD-10-CM | POA: Insufficient documentation

## 2016-08-04 DIAGNOSIS — Z9221 Personal history of antineoplastic chemotherapy: Secondary | ICD-10-CM | POA: Insufficient documentation

## 2016-08-04 DIAGNOSIS — Z8719 Personal history of other diseases of the digestive system: Secondary | ICD-10-CM | POA: Insufficient documentation

## 2016-08-04 DIAGNOSIS — G629 Polyneuropathy, unspecified: Secondary | ICD-10-CM | POA: Diagnosis not present

## 2016-08-04 DIAGNOSIS — R569 Unspecified convulsions: Secondary | ICD-10-CM | POA: Insufficient documentation

## 2016-08-04 DIAGNOSIS — Z85828 Personal history of other malignant neoplasm of skin: Secondary | ICD-10-CM | POA: Insufficient documentation

## 2016-08-04 DIAGNOSIS — C7931 Secondary malignant neoplasm of brain: Secondary | ICD-10-CM | POA: Insufficient documentation

## 2016-08-04 DIAGNOSIS — F419 Anxiety disorder, unspecified: Secondary | ICD-10-CM | POA: Diagnosis not present

## 2016-08-04 DIAGNOSIS — Z87440 Personal history of urinary (tract) infections: Secondary | ICD-10-CM | POA: Diagnosis not present

## 2016-08-04 DIAGNOSIS — K219 Gastro-esophageal reflux disease without esophagitis: Secondary | ICD-10-CM | POA: Diagnosis not present

## 2016-08-04 DIAGNOSIS — Z923 Personal history of irradiation: Secondary | ICD-10-CM | POA: Insufficient documentation

## 2016-08-04 DIAGNOSIS — I878 Other specified disorders of veins: Secondary | ICD-10-CM

## 2016-08-04 DIAGNOSIS — C3490 Malignant neoplasm of unspecified part of unspecified bronchus or lung: Secondary | ICD-10-CM | POA: Insufficient documentation

## 2016-08-04 DIAGNOSIS — Z853 Personal history of malignant neoplasm of breast: Secondary | ICD-10-CM | POA: Diagnosis not present

## 2016-08-04 DIAGNOSIS — K449 Diaphragmatic hernia without obstruction or gangrene: Secondary | ICD-10-CM | POA: Diagnosis not present

## 2016-08-04 HISTORY — DX: Unspecified convulsions: R56.9

## 2016-08-04 HISTORY — PX: IR GENERIC HISTORICAL: IMG1180011

## 2016-08-04 HISTORY — DX: Personal history of urinary (tract) infections: Z87.440

## 2016-08-04 LAB — CBC WITH DIFFERENTIAL/PLATELET
Basophils Absolute: 0 10*3/uL (ref 0.0–0.1)
Basophils Relative: 0 %
EOS ABS: 0 10*3/uL (ref 0.0–0.7)
Eosinophils Relative: 0 %
HEMATOCRIT: 36.8 % (ref 36.0–46.0)
HEMOGLOBIN: 13 g/dL (ref 12.0–15.0)
LYMPHS ABS: 0.6 10*3/uL — AB (ref 0.7–4.0)
LYMPHS PCT: 5 %
MCH: 32.9 pg (ref 26.0–34.0)
MCHC: 35.3 g/dL (ref 30.0–36.0)
MCV: 93.2 fL (ref 78.0–100.0)
MONOS PCT: 3 %
Monocytes Absolute: 0.4 10*3/uL (ref 0.1–1.0)
NEUTROS ABS: 11.4 10*3/uL — AB (ref 1.7–7.7)
NEUTROS PCT: 92 %
Platelets: 202 10*3/uL (ref 150–400)
RBC: 3.95 MIL/uL (ref 3.87–5.11)
RDW: 17 % — ABNORMAL HIGH (ref 11.5–15.5)
WBC: 12.4 10*3/uL — AB (ref 4.0–10.5)

## 2016-08-04 LAB — PROTIME-INR
INR: 0.9
PROTHROMBIN TIME: 12.2 s (ref 11.4–15.2)

## 2016-08-04 MED ORDER — HEPARIN SOD (PORK) LOCK FLUSH 100 UNIT/ML IV SOLN
INTRAVENOUS | Status: AC
  Filled 2016-08-04: qty 5

## 2016-08-04 MED ORDER — FENTANYL CITRATE (PF) 100 MCG/2ML IJ SOLN
INTRAMUSCULAR | Status: AC | PRN
  Administered 2016-08-04: 50 ug via INTRAVENOUS
  Administered 2016-08-04: 25 ug via INTRAVENOUS

## 2016-08-04 MED ORDER — MIDAZOLAM HCL 2 MG/2ML IJ SOLN
INTRAMUSCULAR | Status: AC
  Filled 2016-08-04: qty 6

## 2016-08-04 MED ORDER — LIDOCAINE-EPINEPHRINE (PF) 2 %-1:200000 IJ SOLN
INTRAMUSCULAR | Status: AC | PRN
  Administered 2016-08-04 (×2): 10 mL

## 2016-08-04 MED ORDER — LIDOCAINE-EPINEPHRINE (PF) 2 %-1:200000 IJ SOLN
INTRAMUSCULAR | Status: AC
  Filled 2016-08-04: qty 20

## 2016-08-04 MED ORDER — HEPARIN SOD (PORK) LOCK FLUSH 100 UNIT/ML IV SOLN
INTRAVENOUS | Status: AC | PRN
  Administered 2016-08-04: 500 [IU]

## 2016-08-04 MED ORDER — CEFAZOLIN SODIUM-DEXTROSE 2-4 GM/100ML-% IV SOLN
2.0000 g | INTRAVENOUS | Status: AC
  Administered 2016-08-04: 2 g via INTRAVENOUS
  Filled 2016-08-04: qty 100

## 2016-08-04 MED ORDER — FENTANYL CITRATE (PF) 100 MCG/2ML IJ SOLN
INTRAMUSCULAR | Status: AC
  Filled 2016-08-04: qty 4

## 2016-08-04 MED ORDER — MIDAZOLAM HCL 2 MG/2ML IJ SOLN
INTRAMUSCULAR | Status: AC | PRN
  Administered 2016-08-04 (×4): 1 mg via INTRAVENOUS

## 2016-08-04 MED ORDER — SODIUM CHLORIDE 0.9 % IV SOLN
INTRAVENOUS | Status: DC
  Administered 2016-08-04: 13:00:00 via INTRAVENOUS

## 2016-08-04 NOTE — Telephone Encounter (Signed)
[employer] iwll not cover second decadron rx for premed because pt has decadron #120 for brain mets.

## 2016-08-04 NOTE — Discharge Instructions (Signed)
Implanted Port Home Guide °An implanted port is a type of central line that is placed under the skin. Central lines are used to provide IV access when treatment or nutrition needs to be given through a person's veins. Implanted ports are used for long-term IV access. An implanted port may be placed because:  °· You need IV medicine that would be irritating to the small veins in your hands or arms.   °· You need long-term IV medicines, such as antibiotics.   °· You need IV nutrition for a long period.   °· You need frequent blood draws for lab tests.   °· You need dialysis.   °Implanted ports are usually placed in the chest area, but they can also be placed in the upper arm, the abdomen, or the leg. An implanted port has two main parts:  °· Reservoir. The reservoir is round and will appear as a small, raised area under your skin. The reservoir is the part where a needle is inserted to give medicines or draw blood.   °· Catheter. The catheter is a thin, flexible tube that extends from the reservoir. The catheter is placed into a large vein. Medicine that is inserted into the reservoir goes into the catheter and then into the vein.   °HOW WILL I CARE FOR MY INCISION SITE? °Do not get the incision site wet. Bathe or shower as directed by your health care provider.  °HOW IS MY PORT ACCESSED? °Special steps must be taken to access the port:  °· Before the port is accessed, a numbing cream can be placed on the skin. This helps numb the skin over the port site.   °· Your health care provider uses a sterile technique to access the port. °¨ Your health care provider must put on a mask and sterile gloves. °¨ The skin over your port is cleaned carefully with an antiseptic and allowed to dry. °¨ The port is gently pinched between sterile gloves, and a needle is inserted into the port. °· Only "non-coring" port needles should be used to access the port. Once the port is accessed, a blood return should be checked. This helps  ensure that the port is in the vein and is not clogged.   °· If your port needs to remain accessed for a constant infusion, a clear (transparent) bandage will be placed over the needle site. The bandage and needle will need to be changed every week, or as directed by your health care provider.   °· Keep the bandage covering the needle clean and dry. Do not get it wet. Follow your health care provider's instructions on how to take a shower or bath while the port is accessed.   °· If your port does not need to stay accessed, no bandage is needed over the port.   °WHAT IS FLUSHING? °Flushing helps keep the port from getting clogged. Follow your health care provider's instructions on how and when to flush the port. Ports are usually flushed with saline solution or a medicine called heparin. The need for flushing will depend on how the port is used.  °· If the port is used for intermittent medicines or blood draws, the port will need to be flushed:   °¨ After medicines have been given.   °¨ After blood has been drawn.   °¨ As part of routine maintenance.   °· If a constant infusion is running, the port may not need to be flushed.   °HOW LONG WILL MY PORT STAY IMPLANTED? °The port can stay in for as long as your health care   provider thinks it is needed. When it is time for the port to come out, surgery will be done to remove it. The procedure is similar to the one performed when the port was put in.  WHEN SHOULD I SEEK IMMEDIATE MEDICAL CARE? When you have an implanted port, you should seek immediate medical care if:   You notice a bad smell coming from the incision site.   You have swelling, redness, or drainage at the incision site.   You have more swelling or pain at the port site or the surrounding area.   You have a fever that is not controlled with medicine. This information is not intended to replace advice given to you by your health care provider. Make sure you discuss any questions you have with  your health care provider. Document Released: 07/13/2005 Document Revised: 05/03/2013 Document Reviewed: 03/20/2013 Elsevier Interactive Patient Education  2017 [place] Insertion, Care After Refer to this sheet in the next few weeks. These instructions provide you with information on caring for yourself after your procedure. Your health care provider may also give you more specific instructions. Your treatment has been planned according to current medical practices, but problems sometimes occur. Call your health care provider if you have any problems or questions after your procedure. WHAT TO EXPECT AFTER THE PROCEDURE After your procedure, it is typical to have the following:   Discomfort at the port insertion site. Ice packs to the area will help.  Bruising on the skin over the port. This will subside in 3-4 days. HOME CARE INSTRUCTIONS  After your port is placed, you will get a manufacturer's information card. The card has information about your port. Keep this card with you at all times.   Know what kind of port you have. There are many types of ports available.   Wear a medical alert bracelet in case of an emergency. This can help alert health care workers that you have a port.   The port can stay in for as long as your health care provider believes it is necessary.   A home health care nurse may give medicines and take care of the port.   You or a family member can get special training and directions for giving medicine and taking care of the port at home.  SEEK MEDICAL CARE IF:   Your port does not flush or you are unable to get a blood return.   You have a fever or chills. SEEK IMMEDIATE MEDICAL CARE IF:  You have new fluid or pus coming from your incision.   You notice a bad smell coming from your incision site.   You have swelling, pain, or more redness at the incision or port site.   You have chest pain or shortness of breath. This  information is not intended to replace advice given to you by your health care provider. Make sure you discuss any questions you have with your health care provider. Document Released: 05/03/2013 Document Revised: 07/18/2013 Document Reviewed: 05/03/2013 Elsevier Interactive Patient Education  2017 [place]. Moderate Conscious Sedation, Adult, Care After These instructions provide you with information about caring for yourself after your procedure. Your health care provider may also give you more specific instructions. Your treatment has been planned according to current medical practices, but problems sometimes occur. Call your health care provider if you have any problems or questions after your procedure. What can I expect after the procedure? After your procedure, it is common:  To feel sleepy for several hours.  To feel clumsy and have poor balance for several hours.  To have poor judgment for several hours.  To vomit if you eat too soon. Follow these instructions at home: For at least 24 hours after the procedure:   Do not:  Participate in activities where you could fall or become injured.  Drive.  Use heavy machinery.  Drink alcohol.  Take sleeping pills or medicines that cause drowsiness.  Make important decisions or sign legal documents.  Take care of children on your own.  Rest. Eating and drinking  Follow the diet recommended by your health care provider.  If you vomit:  Drink water, juice, or soup when you can drink without vomiting.  Make sure you have little or no nausea before eating solid foods. General instructions  Have a responsible adult stay with you until you are awake and alert.  Take over-the-counter and prescription medicines only as told by your health care provider.  If you smoke, do not smoke without supervision.  Keep all follow-up visits as told by your health care provider. This is important. Contact a health care provider  if:  You keep feeling nauseous or you keep vomiting.  You feel light-headed.  You develop a rash.  You have a fever. Get help right away if:  You have trouble breathing. This information is not intended to replace advice given to you by your health care provider. Make sure you discuss any questions you have with your health care provider. Document Released: 05/03/2013 Document Revised: 12/16/2015 Document Reviewed: 11/02/2015 Elsevier Interactive Patient Education  2017 [employer].
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
## 2016-08-04 NOTE — Telephone Encounter (Signed)
Ruby stopped by to say insurance will not pay for decadron premed. I instructed Molly Black to have pt continue taking decadron as prescribed for her brain mets and not get the other decadron premed rx.

## 2016-08-04 NOTE — Procedures (Addendum)
Interventional Radiology Procedure Note  Procedure: Placement of a LEFT IJ approach single lumen PowerPort.  Tip is positioned at the superior cavoatrial junction and catheter is ready for immediate use.  Complications: No immediate Recommendations:  - Ok to shower tomorrow - Do not submerge for 7 days - Routine line care   Signed,  Criselda Peaches, MD

## 2016-08-07 NOTE — Progress Notes (Signed)
  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 PTV 1 Right frontal 16 mm was treated to 20 Gy in 1 fraction. 2. PTV 2 & 3 Right frontal 16 mm was treated to 20 Gy in 1 fraction. 3. PTV 4 Right frontal 8 mm was treated to 20 Gy in 1 fraction.   Beams/energy:   SBRT/SRT-3D // 6FFF  Narrative: The patient tolerated SRS treatment relatively well. The patient did not experience any toxicity side effects with treatment.   Plan: The patient has completed radiation treatment. The patient will return to radiation oncology clinic for routine followup in one month. I advised her to call or return sooner if she has any questions or concerns related to her recovery or treatment. ________________________________  Sheral Apley. Tammi Klippel, M.D.  This document serves as a record of services personally performed by Tyler Pita, MD. It was created on his behalf by Arlyce Harman, a trained medical scribe. The creation of this record is based on the scribe's personal observations and the provider's statements to them. This document has been checked and approved by the attending provider.

## 2016-08-10 ENCOUNTER — Other Ambulatory Visit (HOSPITAL_BASED_OUTPATIENT_CLINIC_OR_DEPARTMENT_OTHER): Payer: Medicare Other

## 2016-08-10 ENCOUNTER — Ambulatory Visit (HOSPITAL_BASED_OUTPATIENT_CLINIC_OR_DEPARTMENT_OTHER): Payer: Medicare Other

## 2016-08-10 ENCOUNTER — Other Ambulatory Visit: Payer: Medicare Other

## 2016-08-10 ENCOUNTER — Other Ambulatory Visit: Payer: Self-pay | Admitting: Medical Oncology

## 2016-08-10 VITALS — BP 136/85 | HR 68 | Temp 98.1°F | Resp 20

## 2016-08-10 DIAGNOSIS — C3432 Malignant neoplasm of lower lobe, left bronchus or lung: Secondary | ICD-10-CM

## 2016-08-10 DIAGNOSIS — Z5112 Encounter for antineoplastic immunotherapy: Secondary | ICD-10-CM

## 2016-08-10 DIAGNOSIS — C3492 Malignant neoplasm of unspecified part of left bronchus or lung: Secondary | ICD-10-CM

## 2016-08-10 DIAGNOSIS — C801 Malignant (primary) neoplasm, unspecified: Secondary | ICD-10-CM

## 2016-08-10 DIAGNOSIS — C3491 Malignant neoplasm of unspecified part of right bronchus or lung: Secondary | ICD-10-CM

## 2016-08-10 DIAGNOSIS — Z5111 Encounter for antineoplastic chemotherapy: Secondary | ICD-10-CM | POA: Diagnosis not present

## 2016-08-10 DIAGNOSIS — Z95828 Presence of other vascular implants and grafts: Secondary | ICD-10-CM

## 2016-08-10 DIAGNOSIS — C7972 Secondary malignant neoplasm of left adrenal gland: Secondary | ICD-10-CM

## 2016-08-10 LAB — CBC WITH DIFFERENTIAL/PLATELET
BASO%: 0.2 % (ref 0.0–2.0)
BASOS ABS: 0 10*3/uL (ref 0.0–0.1)
EOS ABS: 0 10*3/uL (ref 0.0–0.5)
EOS%: 0 % (ref 0.0–7.0)
HEMATOCRIT: 38.5 % (ref 34.8–46.6)
HGB: 13.5 g/dL (ref 11.6–15.9)
LYMPH%: 1.9 % — AB (ref 14.0–49.7)
MCH: 33.8 pg (ref 25.1–34.0)
MCHC: 35.1 g/dL (ref 31.5–36.0)
MCV: 96.2 fL (ref 79.5–101.0)
MONO#: 0.4 10*3/uL (ref 0.1–0.9)
MONO%: 3.4 % (ref 0.0–14.0)
NEUT#: 10 10*3/uL — ABNORMAL HIGH (ref 1.5–6.5)
NEUT%: 94.5 % — AB (ref 38.4–76.8)
PLATELETS: 177 10*3/uL (ref 145–400)
RBC: 4.01 10*6/uL (ref 3.70–5.45)
RDW: 18.4 % — ABNORMAL HIGH (ref 11.2–14.5)
WBC: 10.6 10*3/uL — ABNORMAL HIGH (ref 3.9–10.3)
lymph#: 0.2 10*3/uL — ABNORMAL LOW (ref 0.9–3.3)

## 2016-08-10 LAB — COMPREHENSIVE METABOLIC PANEL
ALT: 25 U/L (ref 0–55)
ANION GAP: 9 meq/L (ref 3–11)
AST: 19 U/L (ref 5–34)
Albumin: 3.3 g/dL — ABNORMAL LOW (ref 3.5–5.0)
Alkaline Phosphatase: 39 U/L — ABNORMAL LOW (ref 40–150)
BILIRUBIN TOTAL: 1.53 mg/dL — AB (ref 0.20–1.20)
BUN: 26.4 mg/dL — ABNORMAL HIGH (ref 7.0–26.0)
CALCIUM: 9 mg/dL (ref 8.4–10.4)
CHLORIDE: 105 meq/L (ref 98–109)
CO2: 25 meq/L (ref 22–29)
Creatinine: 0.6 mg/dL (ref 0.6–1.1)
Glucose: 80 mg/dl (ref 70–140)
Potassium: 4.3 mEq/L (ref 3.5–5.1)
Sodium: 139 mEq/L (ref 136–145)
Total Protein: 5.9 g/dL — ABNORMAL LOW (ref 6.4–8.3)

## 2016-08-10 LAB — TECHNOLOGIST REVIEW

## 2016-08-10 LAB — UA PROTEIN, DIPSTICK - CHCC: PROTEIN: NEGATIVE mg/dL

## 2016-08-10 MED ORDER — SODIUM CHLORIDE 0.9 % IV SOLN
15.0000 mg/kg | Freq: Once | INTRAVENOUS | Status: AC
  Administered 2016-08-10: 1125 mg via INTRAVENOUS
  Filled 2016-08-10: qty 45

## 2016-08-10 MED ORDER — SODIUM CHLORIDE 0.9% FLUSH
10.0000 mL | INTRAVENOUS | Status: DC | PRN
  Administered 2016-08-10: 10 mL
  Filled 2016-08-10: qty 10

## 2016-08-10 MED ORDER — PALONOSETRON HCL INJECTION 0.25 MG/5ML
INTRAVENOUS | Status: AC
Start: 2016-08-10 — End: 2016-08-10
  Filled 2016-08-10: qty 5

## 2016-08-10 MED ORDER — SODIUM CHLORIDE 0.9 % IV SOLN
500.0000 mg/m2 | Freq: Once | INTRAVENOUS | Status: AC
  Administered 2016-08-10: 900 mg via INTRAVENOUS
  Filled 2016-08-10: qty 20

## 2016-08-10 MED ORDER — PALONOSETRON HCL INJECTION 0.25 MG/5ML
0.2500 mg | Freq: Once | INTRAVENOUS | Status: AC
  Administered 2016-08-10: 0.25 mg via INTRAVENOUS

## 2016-08-10 MED ORDER — SODIUM CHLORIDE 0.9 % IV SOLN
Freq: Once | INTRAVENOUS | Status: AC
  Administered 2016-08-10: 14:00:00 via INTRAVENOUS

## 2016-08-10 MED ORDER — SODIUM CHLORIDE 0.9 % IV SOLN
Freq: Once | INTRAVENOUS | Status: AC
  Administered 2016-08-10: 14:00:00 via INTRAVENOUS
  Filled 2016-08-10: qty 5

## 2016-08-10 MED ORDER — SODIUM CHLORIDE 0.9 % IV SOLN
421.0000 mg | Freq: Once | INTRAVENOUS | Status: AC
  Administered 2016-08-10: 420 mg via INTRAVENOUS
  Filled 2016-08-10: qty 42

## 2016-08-10 MED ORDER — HEPARIN SOD (PORK) LOCK FLUSH 100 UNIT/ML IV SOLN
500.0000 [IU] | Freq: Once | INTRAVENOUS | Status: AC | PRN
  Administered 2016-08-10: 500 [IU]
  Filled 2016-08-10: qty 5

## 2016-08-10 MED ORDER — LIDOCAINE-PRILOCAINE 2.5-2.5 % EX CREA: 1.0000 "application " | TOPICAL_CREAM | CUTANEOUS | 0 refills | Status: DC | PRN

## 2016-08-10 NOTE — Patient Instructions (Signed)
[facility] Discharge Instructions for Patients Receiving Chemotherapy  Today you received the following chemotherapy agents Avastin, Alimta and Carboplatin   To help prevent nausea and vomiting after your treatment, we encourage you to take your nausea medication as directed. No Zofran for 3 days. Take Compazine instead.    If you develop nausea and vomiting that is not controlled by your nausea medication, call the clinic.   BELOW ARE SYMPTOMS THAT SHOULD BE REPORTED IMMEDIATELY:  *FEVER GREATER THAN 100.5 F  *CHILLS WITH OR WITHOUT FEVER  NAUSEA AND VOMITING THAT IS NOT CONTROLLED WITH YOUR NAUSEA MEDICATION  *UNUSUAL SHORTNESS OF BREATH  *UNUSUAL BRUISING OR BLEEDING  TENDERNESS IN MOUTH AND THROAT WITH OR WITHOUT PRESENCE OF ULCERS  *URINARY PROBLEMS  *BOWEL PROBLEMS  UNUSUAL RASH Items with * indicate a potential emergency and should be followed up as soon as possible.  Feel free to call the clinic you have any questions or concerns. The clinic phone number is [phone].  Please show the [facility] at check-in to the Emergency Department and triage nurse.  Pemetrexed injection What is this medicine? PEMETREXED (PEM e TREX ed) is a chemotherapy drug. This medicine affects cells that are rapidly growing, such as cancer cells and cells in your mouth and stomach. It is usually used to treat lung cancers like non-small cell lung cancer and mesothelioma. It may also be used to treat other cancers. This medicine may be used for other purposes; ask your health care provider or pharmacist if you have questions. COMMON BRAND NAME(S): Alimta What should I tell my health care provider before I take this medicine? They need to know if you have any of these conditions: -if you frequently drink alcohol containing beverages -infection (especially a virus infection such as chickenpox, cold sores, or herpes) -kidney disease -liver disease -low blood counts,  like low platelets, red bloods, or white blood cells -an unusual or allergic reaction to pemetrexed, mannitol, other medicines, foods, dyes, or preservatives -pregnant or trying to get pregnant -breast-feeding How should I use this medicine? This drug is given as an infusion into a vein. It is administered in a hospital or clinic by a specially trained health care professional. Talk to your pediatrician regarding the use of this medicine in children. Special care may be needed. Overdosage: If you think you have taken too much of this medicine contact a poison control center or emergency room at once. NOTE: This medicine is only for you. Do not share this medicine with others. What if I miss a dose? It is important not to miss your dose. Call your doctor or health care professional if you are unable to keep an appointment. What may interact with this medicine? -aspirin and aspirin-like medicines -medicines to increase blood counts like filgrastim, pegfilgrastim, sargramostim -methotrexate -NSAIDS, medicines for pain and inflammation, like ibuprofen or naproxen -probenecid -pyrimethamine -vaccines Talk to your doctor or health care professional before taking any of these medicines: -acetaminophen -aspirin -ibuprofen -ketoprofen -naproxen This list may not describe all possible interactions. Give your health care provider a list of all the medicines, herbs, non-prescription drugs, or dietary supplements you use. Also tell them if you smoke, drink alcohol, or use illegal drugs. Some items may interact with your medicine. What should I watch for while using this medicine? Visit your doctor for checks on your progress. This drug may make you feel generally unwell. This is not uncommon, as chemotherapy can affect healthy cells as well as  cancer cells. Report any side effects. Continue your course of treatment even though you feel ill unless your doctor tells you to stop. In some cases, you may  be given additional medicines to help with side effects. Follow all directions for their use. Call your doctor or health care professional for advice if you get a fever, chills or sore throat, or other symptoms of a cold or flu. Do not treat yourself. This drug decreases your body's ability to fight infections. Try to avoid being around people who are sick. This medicine may increase your risk to bruise or bleed. Call your doctor or health care professional if you notice any unusual bleeding. Be careful brushing and flossing your teeth or using a toothpick because you may get an infection or bleed more easily. If you have any dental work done, tell your dentist you are receiving this medicine. Avoid taking products that contain aspirin, acetaminophen, ibuprofen, naproxen, or ketoprofen unless instructed by your doctor. These medicines may hide a fever. Call your doctor or health care professional if you get diarrhea or mouth sores. Do not treat yourself. To protect your kidneys, drink water or other fluids as directed while you are taking this medicine. Men and women must use effective birth control while taking this medicine. You may also need to continue using effective birth control for a time after stopping this medicine. Do not become pregnant while taking this medicine. Tell your doctor right away if you think that you or your partner might be pregnant. There is a potential for serious side effects to an unborn child. Talk to your health care professional or pharmacist for more information. Do not breast-feed an infant while taking this medicine. This medicine may lower sperm counts. What side effects may I notice from receiving this medicine? Side effects that you should report to your doctor or health care professional as soon as possible: -allergic reactions like skin rash, itching or hives, swelling of the face, lips, or tongue -low blood counts - this medicine may decrease the number of white  blood cells, red blood cells and platelets. You may be at increased risk for infections and bleeding. -signs of infection - fever or chills, cough, sore throat, pain or difficulty passing urine -signs of decreased platelets or bleeding - bruising, pinpoint red spots on the skin, black, tarry stools, blood in the urine -signs of decreased red blood cells - unusually weak or tired, fainting spells, lightheadedness -breathing problems, like a dry cough -changes in emotions or moods -chest pain -confusion -diarrhea -high blood pressure -mouth or throat sores or ulcers -pain, swelling, warmth in the leg -pain on swallowing -swelling of the ankles, feet, hands -trouble passing urine or change in the amount of urine -vomiting -yellowing of the eyes or skin Side effects that usually do not require medical attention (report to your doctor or health care professional if they continue or are bothersome): -hair loss -loss of appetite -nausea -stomach upset This list may not describe all possible side effects. Call your doctor for medical advice about side effects. You may report side effects to FDA at 1-800-FDA-1088. Where should I keep my medicine? This drug is given in a hospital or clinic and will not be stored at home. NOTE: This sheet is a summary. It may not cover all possible information. If you have questions about this medicine, talk to your doctor, pharmacist, or health care provider.  2017 Elsevier/Gold Standard (2008-02-14 13:24:03)  Bevacizumab injection What is this medicine? BEVACIZUMAB (  be va SIZ yoo mab) is a monoclonal antibody. It is used to treat cervical cancer, colorectal cancer, glioblastoma multiforme, non-small cell lung cancer (NSCLC), ovarian cancer, and renal cell cancer. This medicine may be used for other purposes; ask your health care provider or pharmacist if you have questions. COMMON BRAND NAME(S): Avastin What should I tell my health care provider before I  take this medicine? They need to know if you have any of these conditions: -blood clots -heart disease, including heart failure, heart attack, or chest pain (angina) -high blood pressure -infection (especially a virus infection such as chickenpox, cold sores, or herpes) -kidney disease -lung disease -prior chemotherapy with doxorubicin, daunorubicin, epirubicin, or other anthracycline type chemotherapy agents -recent or ongoing radiation therapy -recent surgery -stroke -an unusual or allergic reaction to bevacizumab, hamster proteins, mouse proteins, other medicines, foods, dyes, or preservatives -pregnant or trying to get pregnant -breast-feeding How should I use this medicine? This medicine is for infusion into a vein. It is given by a health care professional in a hospital or clinic setting. Talk to your pediatrician regarding the use of this medicine in children. Special care may be needed. Overdosage: If you think you have taken too much of this medicine contact a poison control center or emergency room at once. NOTE: This medicine is only for you. Do not share this medicine with others. What if I miss a dose? It is important not to miss your dose. Call your doctor or health care professional if you are unable to keep an appointment. What may interact with this medicine? Interactions are not expected. This list may not describe all possible interactions. Give your health care provider a list of all the medicines, herbs, non-prescription drugs, or dietary supplements you use. Also tell them if you smoke, drink alcohol, or use illegal drugs. Some items may interact with your medicine. What should I watch for while using this medicine? Your condition will be monitored carefully while you are receiving this medicine. You will need important blood work and urine testing done while you are taking this medicine. During your treatment, let your health care professional know if you have any  unusual symptoms, such as difficulty breathing. This medicine may rarely cause 'gastrointestinal perforation' (holes in the stomach, intestines or colon), a serious side effect requiring surgery to repair. This medicine should be started at least 28 days following major surgery and the site of the surgery should be totally healed. Check with your doctor before scheduling dental work or surgery while you are receiving this treatment. Talk to your doctor if you have recently had surgery or if you have a wound that has not healed. Do not become pregnant while taking this medicine or for 6 months after stopping it. Women should inform their doctor if they wish to become pregnant or think they might be pregnant. There is a potential for serious side effects to an unborn child. Talk to your health care professional or pharmacist for more information. Do not breast-feed an infant while taking this medicine. This medicine has caused ovarian failure in some women. This medicine may interfere with the ability to have a child. You should talk to your doctor or health care professional if you are concerned about your fertility. What side effects may I notice from receiving this medicine? Side effects that you should report to your doctor or health care professional as soon as possible: -allergic reactions like skin rash, itching or hives, swelling of the face, lips, or  tongue -breathing problems -changes in vision -chest pain -confusion -jaw pain, especially after dental work -mouth sores -seizures -severe abdominal pain -severe headache -signs of decreased platelets or bleeding - bruising, pinpoint red spots on the skin, black, tarry stools, nosebleeds, blood in the urine -signs of infection - fever or chills, cough, sore throat, pain or trouble passing urine -sudden numbness or weakness of the face, arm or leg -swelling of legs or ankles -symptoms of a stroke: change in mental awareness, inability to talk  or move one side of the body (especially in patients with lung cancer) -trouble passing urine or change in the amount of urine -trouble speaking or understanding -trouble walking, dizziness, loss of balance or coordination Side effects that usually do not require medical attention (report to your doctor or health care professional if they continue or are bothersome): -constipation -diarrhea -dry skin -headache -loss of appetite -nausea, vomiting This list may not describe all possible side effects. Call your doctor for medical advice about side effects. You may report side effects to FDA at 1-800-FDA-1088. Where should I keep my medicine? This drug is given in a hospital or clinic and will not be stored at home. NOTE: This sheet is a summary. It may not cover all possible information. If you have questions about this medicine, talk to your doctor, pharmacist, or health care provider.  2017 Elsevier/Gold Standard (2015-07-05 15:28:53)  Carboplatin injection What is this medicine? CARBOPLATIN (KAR boe pla tin) is a chemotherapy drug. It targets fast dividing cells, like cancer cells, and causes these cells to die. This medicine is used to treat ovarian cancer and many other cancers. This medicine may be used for other purposes; ask your health care provider or pharmacist if you have questions. COMMON BRAND NAME(S): Paraplatin What should I tell my health care provider before I take this medicine? They need to know if you have any of these conditions: -blood disorders -hearing problems -kidney disease -recent or ongoing radiation therapy -an unusual or allergic reaction to carboplatin, cisplatin, other chemotherapy, other medicines, foods, dyes, or preservatives -pregnant or trying to get pregnant -breast-feeding How should I use this medicine? This drug is usually given as an infusion into a vein. It is administered in a hospital or clinic by a specially trained health care  professional. Talk to your pediatrician regarding the use of this medicine in children. Special care may be needed. Overdosage: If you think you have taken too much of this medicine contact a poison control center or emergency room at once. NOTE: This medicine is only for you. Do not share this medicine with others. What if I miss a dose? It is important not to miss a dose. Call your doctor or health care professional if you are unable to keep an appointment. What may interact with this medicine? -medicines for seizures -medicines to increase blood counts like filgrastim, pegfilgrastim, sargramostim -some antibiotics like amikacin, gentamicin, neomycin, streptomycin, tobramycin -vaccines Talk to your doctor or health care professional before taking any of these medicines: -acetaminophen -aspirin -ibuprofen -ketoprofen -naproxen This list may not describe all possible interactions. Give your health care provider a list of all the medicines, herbs, non-prescription drugs, or dietary supplements you use. Also tell them if you smoke, drink alcohol, or use illegal drugs. Some items may interact with your medicine. What should I watch for while using this medicine? Your condition will be monitored carefully while you are receiving this medicine. You will need important blood work done while you are  taking this medicine. This drug may make you feel generally unwell. This is not uncommon, as chemotherapy can affect healthy cells as well as cancer cells. Report any side effects. Continue your course of treatment even though you feel ill unless your doctor tells you to stop. In some cases, you may be given additional medicines to help with side effects. Follow all directions for their use. Call your doctor or health care professional for advice if you get a fever, chills or sore throat, or other symptoms of a cold or flu. Do not treat yourself. This drug decreases your body's ability to fight infections.  Try to avoid being around people who are sick. This medicine may increase your risk to bruise or bleed. Call your doctor or health care professional if you notice any unusual bleeding. Be careful brushing and flossing your teeth or using a toothpick because you may get an infection or bleed more easily. If you have any dental work done, tell your dentist you are receiving this medicine. Avoid taking products that contain aspirin, acetaminophen, ibuprofen, naproxen, or ketoprofen unless instructed by your doctor. These medicines may hide a fever. Do not become pregnant while taking this medicine. Women should inform their doctor if they wish to become pregnant or think they might be pregnant. There is a potential for serious side effects to an unborn child. Talk to your health care professional or pharmacist for more information. Do not breast-feed an infant while taking this medicine. What side effects may I notice from receiving this medicine? Side effects that you should report to your doctor or health care professional as soon as possible: -allergic reactions like skin rash, itching or hives, swelling of the face, lips, or tongue -signs of infection - fever or chills, cough, sore throat, pain or difficulty passing urine -signs of decreased platelets or bleeding - bruising, pinpoint red spots on the skin, black, tarry stools, nosebleeds -signs of decreased red blood cells - unusually weak or tired, fainting spells, lightheadedness -breathing problems -changes in hearing -changes in vision -chest pain -high blood pressure -low blood counts - This drug may decrease the number of white blood cells, red blood cells and platelets. You may be at increased risk for infections and bleeding. -nausea and vomiting -pain, swelling, redness or irritation at the injection site -pain, tingling, numbness in the hands or feet -problems with balance, talking, walking -trouble passing urine or change in the  amount of urine Side effects that usually do not require medical attention (report to your doctor or health care professional if they continue or are bothersome): -hair loss -loss of appetite -metallic taste in the mouth or changes in taste This list may not describe all possible side effects. Call your doctor for medical advice about side effects. You may report side effects to FDA at 1-800-FDA-1088. Where should I keep my medicine? This drug is given in a hospital or clinic and will not be stored at home. NOTE: This sheet is a summary. It may not cover all possible information. If you have questions about this medicine, talk to your doctor, pharmacist, or health care provider.  2017 Elsevier/Gold Standard (2007-10-18 14:38:05)

## 2016-08-17 ENCOUNTER — Other Ambulatory Visit (HOSPITAL_BASED_OUTPATIENT_CLINIC_OR_DEPARTMENT_OTHER): Payer: Medicare Other

## 2016-08-17 ENCOUNTER — Telehealth: Payer: Self-pay | Admitting: Radiation Oncology

## 2016-08-17 DIAGNOSIS — C3432 Malignant neoplasm of lower lobe, left bronchus or lung: Secondary | ICD-10-CM

## 2016-08-17 DIAGNOSIS — C7972 Secondary malignant neoplasm of left adrenal gland: Secondary | ICD-10-CM

## 2016-08-17 DIAGNOSIS — C801 Malignant (primary) neoplasm, unspecified: Principal | ICD-10-CM

## 2016-08-17 LAB — COMPREHENSIVE METABOLIC PANEL
ALK PHOS: 37 U/L — AB (ref 40–150)
ALT: 43 U/L (ref 0–55)
AST: 26 U/L (ref 5–34)
Albumin: 3.3 g/dL — ABNORMAL LOW (ref 3.5–5.0)
Anion Gap: 9 mEq/L (ref 3–11)
BUN: 28 mg/dL — ABNORMAL HIGH (ref 7.0–26.0)
CO2: 24 meq/L (ref 22–29)
Calcium: 9.6 mg/dL (ref 8.4–10.4)
Chloride: 105 mEq/L (ref 98–109)
Creatinine: 0.6 mg/dL (ref 0.6–1.1)
EGFR: 88 mL/min/{1.73_m2} — AB (ref >90)
GLUCOSE: 116 mg/dL (ref 70–140)
POTASSIUM: 4.1 meq/L (ref 3.5–5.1)
SODIUM: 137 meq/L (ref 136–145)
Total Bilirubin: 1.36 mg/dL — ABNORMAL HIGH (ref 0.20–1.20)
Total Protein: 6 g/dL — ABNORMAL LOW (ref 6.4–8.3)

## 2016-08-17 LAB — TECHNOLOGIST REVIEW

## 2016-08-17 LAB — CBC WITH DIFFERENTIAL/PLATELET
BASO%: 0.3 % (ref 0.0–2.0)
BASOS ABS: 0 10*3/uL (ref 0.0–0.1)
EOS ABS: 0 10*3/uL (ref 0.0–0.5)
EOS%: 1.2 % (ref 0.0–7.0)
HCT: 35.8 % (ref 34.8–46.6)
HGB: 13 g/dL (ref 11.6–15.9)
LYMPH%: 6.7 % — AB (ref 14.0–49.7)
MCH: 33.7 pg (ref 25.1–34.0)
MCHC: 36.3 g/dL — ABNORMAL HIGH (ref 31.5–36.0)
MCV: 92.7 fL (ref 79.5–101.0)
MONO#: 0.3 10*3/uL (ref 0.1–0.9)
MONO%: 9.4 % (ref 0.0–14.0)
NEUT#: 2.7 10*3/uL (ref 1.5–6.5)
NEUT%: 82.4 % — ABNORMAL HIGH (ref 38.4–76.8)
Platelets: 116 10*3/uL — ABNORMAL LOW (ref 145–400)
RBC: 3.86 10*6/uL (ref 3.70–5.45)
RDW: 15.5 % — ABNORMAL HIGH (ref 11.2–14.5)
WBC: 3.3 10*3/uL — AB (ref 3.9–10.3)
lymph#: 0.2 10*3/uL — ABNORMAL LOW (ref 0.9–3.3)

## 2016-08-17 NOTE — Telephone Encounter (Signed)
Phoned patient to advise about decadron taper. No answer. Left message requesting return call.

## 2016-08-18 ENCOUNTER — Telehealth: Payer: Self-pay | Admitting: Radiation Oncology

## 2016-08-18 NOTE — Telephone Encounter (Signed)
-----   Message from Hayden Pedro, PA-C sent at 08/12/2016  4:33 PM EST ----- Regarding: RE: steroid taper instructions   Sam, please tell her to start '4mg'$  bidx 5 days, then 2 mg bid x 5 days, then 2 mg daily x5 days then stop ----- Message ----- From: Pincus Large Sent: 08/12/2016   3:23 PM To: Hayden Pedro, PA-C, # Subject: steroid taper instructions                     Darnell was treated 1/5, but not given steroid taper instructions before she left. She is still taking 4 mg every 6 hours. I let her know that we need to give her some instructions to start tapering off of this and to make sure that she is not taking the steroid Rx given by Dr. Julien Nordmann for her chemo at the same time since it is the same medication. I let her know that a nurse would be in touch with her on Thursday 1/18 to discuss this.  Thank you!!  Manuela Schwartz

## 2016-08-18 NOTE — Progress Notes (Deleted)
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

## 2016-08-18 NOTE — Progress Notes (Signed)
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    Headache:No Dizziness:No Nausea/vomiting:No, sore places blisters in her mouth ordering medication to help and decreasing Decadron instructions given to patient and family.  C/o sore throat  And mouth today. Diplopia:No Ringing in ears:No Visual changes:No Fatigue:Having fatigue all during the day.  Having problems sleeping at night. Cognitive changes:No problems, alert and oriented x 3. Weight: Wt Readings from Last 3 Encounters:  08/19/16 165 lb 6.4 oz (75 kg)  08/04/16 165 lb (74.8 kg)  08/03/16 165 lb 1.6 oz (74.9 kg)  BP 132/89   Pulse 81   Temp 98.2 F (36.8 C) (Oral)   Resp 18   Ht '5\' 2"'$  (1.575 m)   Wt 165 lb 6.4 oz (75 kg)   SpO2 100%   BMI 30.25 kg/m

## 2016-08-18 NOTE — Telephone Encounter (Signed)
Sam- will you offer her an appt to be seen? If she can't come in I'm ok with starting [employer] Mouthwash if she'd like to try that before she comes on 2/5 with me.

## 2016-08-18 NOTE — Telephone Encounter (Signed)
Follow up scheduled for 2 pm tomorrow. Sam

## 2016-08-18 NOTE — Telephone Encounter (Signed)
Phoned patient yesterday to discuss decadron taper. Patient returned this RN's called mid morning and left message. Phoned patient back. Instructed patient to begin the following decadron taper: '4mg'$  bidx 5 days, then 2 mg bid x 5 days, then 2 mg daily x5 days then stop. Patient read back directions to this RN confirming understanding. Patient reports she had chemotherapy last Monday and is scheduled for chemotherapy again this coming Monday. Inquires about lab results. Reports a burning sensation in her mouth when she drinks ginger ale. Explained this RN will contact Dr. Worthy Flank nurse with these concerns and questions. Patient verbalized understanding.

## 2016-08-19 ENCOUNTER — Ambulatory Visit
Admission: RE | Admit: 2016-08-19 | Discharge: 2016-08-19 | Disposition: A | Discharge status: Home / Self Care | Payer: Medicare Other | Source: Ambulatory Visit | Attending: Radiation Oncology | Admitting: Radiation Oncology

## 2016-08-19 ENCOUNTER — Encounter: Payer: Self-pay | Admitting: Radiation Oncology

## 2016-08-19 VITALS — BP 132/89 | HR 81 | Temp 98.2°F | Resp 18 | Ht 62.0 in | Wt 165.4 lb

## 2016-08-19 DIAGNOSIS — C3432 Malignant neoplasm of lower lobe, left bronchus or lung: Secondary | ICD-10-CM | POA: Insufficient documentation

## 2016-08-19 DIAGNOSIS — C7931 Secondary malignant neoplasm of brain: Secondary | ICD-10-CM | POA: Diagnosis present

## 2016-08-19 MED ORDER — SUCRALFATE 1 G PO TABS: 1.0000 g | ORAL_TABLET | Freq: Four times a day (QID) | ORAL | 3 refills | Status: DC

## 2016-08-19 MED ORDER — FIRST-DUKES MOUTHWASH MT SUSP: 15.0000 mL | Freq: Four times a day (QID) | OROMUCOSAL | 0 refills | Status: DC

## 2016-08-20 ENCOUNTER — Telehealth: Payer: Self-pay | Admitting: Radiation Oncology

## 2016-08-20 NOTE — Telephone Encounter (Signed)
Patient left message requesting return call from this RN. Phoned patient's home. Patient concerned she "needs an antibiotic because she is losing her voice." Per Bryson Ha, [place] encouraged patient to continue magic mouthwash as directed. Reassured her that per Bryson Ha there was no evidence of pneumonia yesterday when she evaluated her. However, encouraged patient to contact this RN should her symptoms change. Patient verbalized understanding and expressed appreciation for the call.

## 2016-08-21 NOTE — Progress Notes (Signed)
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 PTV 1 Right frontal 16 mm was treated to 20 Gy in 1 fraction. 2. PTV 2 & 3 Right frontal 16 mm was treated to 20 Gy in 1 fraction. 3. PTV 4 Right frontal 8 mm was treated to 20 Gy in 1 fraction.   05/25/16 - 07/07/16 : 1. The right lung target was treated to 60 Gy in 30 fractions of 2 Gy              2. SBRT Right Adrenal : 50 Gy in 5 fractions of 10 Gy  Narrative:  The patient returns today for follow-up. In summary she was diagnosed with what was thought to be stage III disease and received concurrent chemotherapy and radiotherapy. During the course of her treatment, it was discovered by PET scan that she in fact had Stage IV disease with right adrenal metastases. She completed definitive lung treatment and SBRT to the right adrenal gland as this was felt to be oliogometastatic disease. She presented with stroke like symptoms about 4 weeks ago and was admitted to [facility]. After an MRA was performed, it was identified that rather than a CVA, she had several metastatic deposits in the brain, and she subsequently completed SRS. She has been tapering Dexamethasone, and is concerned about difficulty with thrush.              On review of systems, the patient states she is recovering from the effects of radiotherapy well. She denies headaches, nausea, or vomiting. She is taking a PPI for prophylaxis. She reports she is still having pain deep in the chest with swallowing, and reports that she's been a bit hoarse because of this. She denies any emesis, or sternal chest pain. She denies  cough, or fever. No other complaints are verbalized.  ALLERGIES:  has No Known Allergies.  Meds: Current Outpatient Prescriptions  Medication Sig Dispense Refill  . aspirin 81 MG tablet Take 81 mg by mouth daily.    . beta carotene w/minerals (OCUVITE) tablet Take 1 tablet by mouth daily.    . Calcium Carbonate-Vitamin D (CALCIUM 600+D PO) Take by mouth 2 (two) times daily.    . Cholecalciferol (VITAMIN D3) 3000 UNITS TABS Take 1 tablet by mouth daily.    . Cyanocobalamin 2500 MCG TABS Take 1 tablet by mouth daily.    Marland Kitchen dexamethasone (DECADRON) 4 MG tablet 4 mg by mouth twice a day the day before, day of and day after the chemotherapy every 3 weeks. 40 tablet 1  . feeding supplement (ENSURE CLINICAL STRENGTH) LIQD Take 237 mLs by mouth AC breakfast. Takes occasionally    . folic acid (FOLVITE) 1 MG tablet Take 1 tablet (1 mg total) by mouth daily. 30 tablet 4  . lacosamide 100 MG TABS Take 1 tablet (100 mg total) by mouth 2 (two) times daily. 60 tablet 0  . Multiple Vitamin (MULTIVITAMIN WITH MINERALS) TABS tablet Take 1 tablet by mouth daily.    . Omega-3 Fatty Acids (FISH OIL) 1000 MG CAPS Take 2,000 mg by mouth daily.    Marland Kitchen  omeprazole (PRILOSEC) 40 MG capsule Take 1 capsule (40 mg total) by mouth 2 (two) times daily. 30 minutes before breakfast and 30 minutes before dinner 180 capsule 1  . Probiotic Product (SUPER PROBIOTIC PO) Take 1 tablet by mouth daily.    Marland Kitchen dexamethasone (DECADRON) 4 MG tablet Take 4 mg by mouth every 6 (six) hours.    . Diphenhyd-Hydrocort-Nystatin (FIRST-DUKES MOUTHWASH) SUSP Swish and swallow 15 mLs 4 (four) times daily. 237 mL 0  . lidocaine-prilocaine (EMLA) cream Apply 1 application topically as needed. Apply 1-2  tsp over port site 1.5 -2 hours prior to chemotherapy. (Patient not taking: Reported on 08/19/2016) 30 g 0  . prochlorperazine (COMPAZINE) 10 MG tablet Take 10 mg by mouth every 6 (six) hours as needed.    . sucralfate (CARAFATE) 1 g tablet Take 1 tablet  (1 g total) by mouth 4 (four) times daily. 120 tablet 3   No current facility-administered medications for this encounter.     Physical Findings:  height is '5\' 2"'$  (1.575 m) and weight is 165 lb 6.4 oz (75 kg). Her oral temperature is 98.2 F (36.8 C). Her blood pressure is 132/89 and her pulse is 81. Her respiration is 18 and oxygen saturation is 100%.  In general this is a well appearing caucasian female in no acute distress. She's alert and oriented x4 and appropriate throughout the examination. Cardiopulmonary assessment is negative for acute distress and she exhibits normal effort. HEENT reveals that she is normocephalic, atraumatic. EOMs are intact. Oral mucosa reveals several ulcerative lesions of the buccal mucosa, hyperkaratoic changes of the tongue, and white plaque like changes of the palate of the mouth.   Lab Findings: Lab Results  Component Value Date   WBC 3.3 (L) 08/17/2016   HGB 13.0 08/17/2016   HCT 35.8 08/17/2016   MCV 92.7 08/17/2016   PLT 116 (L) 08/17/2016     Radiographic Findings: Ct Chest W Contrast  Result Date: 07/29/2016 CLINICAL DATA:  76 year old female with history of metastatic lung cancer to the adrenal glands and brain. EXAM: CT CHEST, ABDOMEN, AND PELVIS WITH CONTRAST TECHNIQUE: Multidetector CT imaging of the chest, abdomen and pelvis was performed following the standard protocol during bolus administration of intravenous contrast. CONTRAST:  1 ISOVUE-300 IOPAMIDOL (ISOVUE-300) INJECTION 61% COMPARISON:  PET-CT 05/22/2016. CT of the chest, abdomen and pelvis 05/07/2016. FINDINGS: CT CHEST FINDINGS Cardiovascular: Heart size is normal. There is no significant pericardial fluid, thickening or pericardial calcification. There is aortic atherosclerosis, as well as atherosclerosis of the great vessels of the mediastinum and the coronary arteries, including calcified atherosclerotic plaque in the left anterior descending and right coronary arteries.  Mediastinum/Nodes: Previously noted right supraclavicular lymphadenopathy has significantly regressed, with the largest right supraclavicular lymph node measuring 1.7 cm on short axis on today's exam (image 4 of series 2) as compared with 2 cm on the prior study. Previously noted mediastinal lymphadenopathy has regressed, although there continue to be several enlarged lymph nodes. The largest of these measures 1.6 cm in short axis in the low right paratracheal nodal station (image 20 of series 2). No hilar lymphadenopathy. Small hiatal hernia. Lungs/Pleura: Previously noted left lower lobe mass has significantly regressed, currently a 1.6 x 2.1 cm spiculated nodule (image 81 of series 6). Previously noted ground-glass attenuation nodules in the superior segment of the left lower lobe (image 45 of series 6) is slightly less apparent than the prior study but persistent measuring 1.4 cm, and the other previously noted ground-glass attenuation  nodule in the left lower lobe is also less apparent currently measuring 1.6 cm (image 62 of series 6). No other new suspicious appearing pulmonary nodules or masses are noted. Mild diffuse bronchial wall thickening with mild centrilobular emphysema. Scattered areas of mild cylindrical bronchiectasis are noted, most evident in the right lower lobe. No acute consolidative airspace disease. No pleural effusions. Musculoskeletal: Status post right modified radical mastectomy and breast reconstruction with a subpectoral implant. Surgical clips in the right axilla from prior lymph node dissection. There are no aggressive appearing lytic or blastic lesions noted in the visualized portions of the skeleton. CT ABDOMEN PELVIS FINDINGS Hepatobiliary: Several well-defined low-attenuation lesions (largest of which are in segments 1 and 4 A) are compatible with simple cysts. There is also a perfusion anomaly in the right lobe of the liver around some ill-defined low-attenuation areas  (incompletely visualized on the delayed images), favored to reflect an underlying cavernous hemangioma, similar to the prior study. No other suspicious appearing hepatic lesions are noted. Status post cholecystectomy. Pancreas: No pancreatic mass. No pancreatic ductal dilatation. No pancreatic or peripancreatic fluid or inflammatory changes. Spleen: Unremarkable. Adrenals/Urinary Tract: 1.2 cm nodule in the medial limb of the left adrenal gland is indeterminate, but was markedly hypermetabolic on prior PET-CT, presumably a metastatic lesion. Right adrenal gland and bilateral kidneys are normal in appearance. There is no hydroureteronephrosis. Urinary bladder is normal in appearance. Stomach/Bowel: The appearance of the stomach is normal. There is no pathologic dilatation of small bowel or colon. Numerous colonic diverticulae are present, without surrounding inflammatory changes to suggest an acute diverticulitis at this time. The appendix is not confidently identified and may be surgically absent. Regardless, there are no inflammatory changes noted adjacent to the cecum to suggest the presence of an acute appendicitis at this time. Vascular/Lymphatic: Aortic atherosclerosis, without evidence of aneurysm or dissection in the abdominal or pelvic vasculature. No lymphadenopathy noted in the abdomen or pelvis. Reproductive: Status post hysterectomy. Ovaries are not confidently identified may be surgically absent or atrophic. Other: No significant volume of ascites.  No pneumoperitoneum. Musculoskeletal: Grade 1 anterolisthesis of L4 upon L5. There are no aggressive appearing lytic or blastic lesions noted in the visualized portions of the skeleton. IMPRESSION: 1. Today's study demonstrates a positive response to therapy as evidenced by regression of the primary left lower lobe nodule, regression of right supraclavicular and mediastinal lymphadenopathy, and stability of the left adrenal nodule which is presumably  metastatic. No new sites of metastatic disease are otherwise noted in the chest, abdomen or pelvis. 2. Aortic atherosclerosis, in addition to 2 vessel coronary artery disease. Assessment for potential risk factor modification, dietary therapy or pharmacologic therapy may be warranted, if clinically indicated. 3. Colonic diverticulosis without evidence of acute diverticulitis at this time. 4. Additional incidental findings, as above. Electronically Signed   By: Vinnie Langton M.D.   On: 07/29/2016 15:20   Mr Jeri Cos JA Contrast  Result Date: 07/24/2016 CLINICAL DATA:  Brain lesion.  Metastatic lung cancer.  SRS planning EXAM: MRI HEAD WITHOUT AND WITH CONTRAST TECHNIQUE: Multiplanar, multiecho pulse sequences of the brain and surrounding structures were obtained without and with intravenous contrast. CONTRAST:  8m MULTIHANCE GADOBENATE DIMEGLUMINE 529 MG/ML IV SOLN COMPARISON:  07/22/2016 FINDINGS: Brain: Re- demonstrated 4 enhancing masses clustered in the right frontal lobe. These developed rapidly when compared with 05/22/2016 exam. 1. The largest most posterior and inferior right frontal mass measures smaller than on the previous study, although still has the same bilobed  central architecture with more avidly enhancing rim. Differences may be from steroids and resolved extracellular leakage of contrast on the previous study. Contrast timing can affect degree of enhancement, but usually not too this degree, and the other lesions appear stable. Currently this mass measures 15 x 10 mm. 2. Right frontal white matter lesion measuring 8 mm, stable appearance from prior. 14:97 3. Higher and more superficial right frontal lobe lesion, 14:109, stable at 8 to 9 mm. 4. Closely contiguous to the third lesion is a 6 mm lesion which it appears discrete on reformats, stable. No newly identified lesion The decreased enhancement of the largest lesion and the clustering of lesions in the right frontal lobe is somewhat  unusual, although finding still likely reflect metastatic lung cancer and treatment effect from steroids. Alternate diagnosis, such as lymphoma, is considered given the response steroids, diffusion hyperintensity, and T2 hypointensity. The appearance is aggressive/malignant; doubt an ischemic or inflammatory process. Vasogenic edema mainly associate with the largest lesion is similar to prior. Stable mild midline shift. No acute infarct. No suspected acute hemorrhage. There is minimal T1 hyperintensity associated with the largest right frontal mass, which could be proteinaceous or blood products. Vascular: Preserved flow voids. Skull and upper cervical spine: No focal lesion. Sinuses/Orbits: Negative for orbital mass. Bilateral cataract resection. Bilateral mucous retention cysts in the maxillary antra. Attempted to discuss case by phone with Dr. Tammi Klippel and Julien Nordmann, but both are currently unavailable. IMPRESSION: 4 enhancing right frontal parenchymal lesions are re- demonstrated; no new lesion. Unexpected decreased enhancement of the largest lesion on the order of 1 cm, presumably a response to steroids. This unexpected change and clustering in the right frontal lobe broaden the differential considerations, although lung metastatic disease is still favored. Further discussion above. Electronically Signed   By: Monte Fantasia M.D.   On: 07/24/2016 13:51   Ct Abdomen Pelvis W Contrast  Result Date: 07/29/2016 CLINICAL DATA:  76 year old female with history of metastatic lung cancer to the adrenal glands and brain. EXAM: CT CHEST, ABDOMEN, AND PELVIS WITH CONTRAST TECHNIQUE: Multidetector CT imaging of the chest, abdomen and pelvis was performed following the standard protocol during bolus administration of intravenous contrast. CONTRAST:  1 ISOVUE-300 IOPAMIDOL (ISOVUE-300) INJECTION 61% COMPARISON:  PET-CT 05/22/2016. CT of the chest, abdomen and pelvis 05/07/2016. FINDINGS: CT CHEST FINDINGS Cardiovascular:  Heart size is normal. There is no significant pericardial fluid, thickening or pericardial calcification. There is aortic atherosclerosis, as well as atherosclerosis of the great vessels of the mediastinum and the coronary arteries, including calcified atherosclerotic plaque in the left anterior descending and right coronary arteries. Mediastinum/Nodes: Previously noted right supraclavicular lymphadenopathy has significantly regressed, with the largest right supraclavicular lymph node measuring 1.7 cm on short axis on today's exam (image 4 of series 2) as compared with 2 cm on the prior study. Previously noted mediastinal lymphadenopathy has regressed, although there continue to be several enlarged lymph nodes. The largest of these measures 1.6 cm in short axis in the low right paratracheal nodal station (image 20 of series 2). No hilar lymphadenopathy. Small hiatal hernia. Lungs/Pleura: Previously noted left lower lobe mass has significantly regressed, currently a 1.6 x 2.1 cm spiculated nodule (image 81 of series 6). Previously noted ground-glass attenuation nodules in the superior segment of the left lower lobe (image 45 of series 6) is slightly less apparent than the prior study but persistent measuring 1.4 cm, and the other previously noted ground-glass attenuation nodule in the left lower lobe is also less  apparent currently measuring 1.6 cm (image 62 of series 6). No other new suspicious appearing pulmonary nodules or masses are noted. Mild diffuse bronchial wall thickening with mild centrilobular emphysema. Scattered areas of mild cylindrical bronchiectasis are noted, most evident in the right lower lobe. No acute consolidative airspace disease. No pleural effusions. Musculoskeletal: Status post right modified radical mastectomy and breast reconstruction with a subpectoral implant. Surgical clips in the right axilla from prior lymph node dissection. There are no aggressive appearing lytic or blastic lesions  noted in the visualized portions of the skeleton. CT ABDOMEN PELVIS FINDINGS Hepatobiliary: Several well-defined low-attenuation lesions (largest of which are in segments 1 and 4 A) are compatible with simple cysts. 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 No other suspicious appearing hepatic lesions are noted. Status post cholecystectomy. Pancreas: No pancreatic mass. No pancreatic ductal dilatation. No pancreatic or peripancreatic fluid or inflammatory changes. Spleen: Unremarkable. Adrenals/Urinary Tract: 1.2 cm nodule in the medial limb of the left adrenal gland is indeterminate, but was markedly hypermetabolic on prior PET-CT, presumably a metastatic lesion. Right adrenal gland and bilateral kidneys are normal in appearance. There is no hydroureteronephrosis. Urinary bladder is normal in appearance. Stomach/Bowel: The appearance of the stomach is normal. There is no pathologic dilatation of small bowel or colon. Numerous colonic diverticulae are present, without surrounding inflammatory changes to suggest an acute diverticulitis at this time. The appendix is not confidently identified and may be surgically absent. Regardless, there are no inflammatory changes noted adjacent to the cecum to suggest the presence of an acute appendicitis at this time. Vascular/Lymphatic: Aortic atherosclerosis, without evidence of aneurysm or dissection in the abdominal or pelvic vasculature. No lymphadenopathy noted in the abdomen or pelvis. Reproductive: Status post hysterectomy. Ovaries are not confidently identified may be surgically absent or atrophic. Other: No significant volume of ascites.  No pneumoperitoneum. Musculoskeletal: Grade 1 anterolisthesis of L4 upon L5. There are no aggressive appearing lytic or blastic lesions  noted in the visualized portions of the skeleton. IMPRESSION: 1. Today's study demonstrates a positive response to therapy as evidenced by regression of the primary left lower lobe nodule, regression of right supraclavicular and mediastinal lymphadenopathy, and stability of the left adrenal nodule which is presumably metastatic. No new sites of metastatic disease are otherwise noted in the chest, abdomen or pelvis. 2. Aortic atherosclerosis, in addition to 2 vessel coronary artery disease. Assessment for potential risk factor modification, dietary therapy or pharmacologic therapy may be warranted, if clinically indicated. 3. Colonic diverticulosis without evidence of acute diverticulitis at this time. 4. Additional incidental findings, as above. Electronically Signed   By: Vinnie Langton M.D.   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 She has very poor venous access and requires durable access for chemotherapy. EXAM: IMPLANTED PORT A CATH PLACEMENT WITH ULTRASOUND AND FLUOROSCOPIC GUIDANCE MEDICATIONS: 2 g Ancef; The antibiotic was administered within an appropriate time interval prior to skin puncture. ANESTHESIA/SEDATION: Versed 4 mg IV; Fentanyl 75 mcg IV; Moderate Sedation Time:  21 minutes The patient was continuously monitored during the procedure by the interventional radiology nurse under my direct supervision. FLUOROSCOPY TIME:  0 minutes, 30 seconds (5 mGy) COMPLICATIONS: None immediate. PROCEDURE: The right neck and chest was prepped with  chlorhexidine, and draped in the usual sterile fashion using maximum barrier technique (cap and mask, sterile gown, sterile gloves, large sterile sheet, hand hygiene and cutaneous antiseptic). Antibiotic prophylaxis was provided with 2g Ancef administered IV one hour prior to skin incision. Local anesthesia was attained by infiltration with 1% lidocaine with  epinephrine. Ultrasound demonstrated patency of the left internal jugular vein, and this was documented with an image. Under real-time ultrasound guidance, this vein was accessed with a 21 gauge micropuncture needle and image documentation was performed. A small dermatotomy was made at the access site with an 11 scalpel. A 0.018" wire was advanced into the SVC and the access needle exchanged for a 98F micropuncture vascular sheath. The 0.018" wire was then removed and a 0.035" wire advanced into the IVC. An appropriate location for the subcutaneous reservoir was selected below the clavicle and an incision was made through the skin and underlying soft tissues. The subcutaneous tissues were then dissected using a combination of blunt and sharp surgical technique and a pocket was formed. A single lumen power injectable portacatheter was then tunneled through the subcutaneous tissues from the pocket to the dermatotomy and the port reservoir placed within the subcutaneous pocket. The venous access site was then serially dilated and a peel away vascular sheath placed over the wire. The wire was removed and the port catheter advanced into position under fluoroscopic guidance. The catheter tip is positioned in the upper right atrium. This was documented with a spot image. The portacatheter was then tested and found to flush and aspirate well. The port was flushed with saline followed by 100 units/mL heparinized saline. The pocket was then closed in two layers using first subdermal inverted interrupted absorbable sutures followed by a running subcuticular suture. The epidermis was then sealed with Dermabond. The dermatotomy at the venous access site was also sealed with Dermabond. IMPRESSION: Successful placement of a left IJ approach Power Port with ultrasound and fluoroscopic guidance. The catheter is ready for use. Signed, Criselda Peaches, MD [facility] Radiology  Electronically Signed   By: Jacqulynn Cadet M.D.   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 She has very poor venous access and requires durable access for chemotherapy. EXAM: IMPLANTED PORT A CATH PLACEMENT WITH ULTRASOUND AND FLUOROSCOPIC GUIDANCE MEDICATIONS: 2 g Ancef; The antibiotic was administered within an appropriate time interval prior to skin puncture. ANESTHESIA/SEDATION: Versed 4 mg IV; Fentanyl 75 mcg IV; Moderate Sedation Time:  21 minutes The patient was continuously monitored during the procedure by the interventional radiology nurse under my direct supervision. FLUOROSCOPY TIME:  0 minutes, 30 seconds (5 mGy) COMPLICATIONS: None immediate. PROCEDURE: The right neck and chest was prepped with chlorhexidine, and draped in the usual sterile fashion using maximum barrier technique (cap and mask, sterile gown, sterile gloves, large sterile sheet, hand hygiene and cutaneous antiseptic). Antibiotic prophylaxis was provided with 2g Ancef administered IV one hour prior to skin incision. Local anesthesia was attained by infiltration with 1% lidocaine with epinephrine. Ultrasound demonstrated patency of the left internal jugular vein, and this was documented with an image. Under real-time ultrasound guidance, this vein was accessed with a 21 gauge micropuncture needle and image documentation was performed. A small dermatotomy was made at the access site with an 11 scalpel. A 0.018" wire was advanced into the SVC and the access needle exchanged for a 98F micropuncture  vascular sheath. The 0.018" wire was then removed and a 0.035" wire advanced into the IVC. An appropriate location for the subcutaneous reservoir was selected below the clavicle and an incision was made through the skin and underlying soft tissues. The subcutaneous tissues were then dissected using a combination of blunt and sharp  surgical technique and a pocket was formed. A single lumen power injectable portacatheter was then tunneled through the subcutaneous tissues from the pocket to the dermatotomy and the port reservoir placed within the subcutaneous pocket. The venous access site was then serially dilated and a peel away vascular sheath placed over the wire. The wire was removed and the port catheter advanced into position under fluoroscopic guidance. The catheter tip is positioned in the upper right atrium. This was documented with a spot image. The portacatheter was then tested and found to flush and aspirate well. The port was flushed with saline followed by 100 units/mL heparinized saline. The pocket was then closed in two layers using first subdermal inverted interrupted absorbable sutures followed by a running subcuticular suture. The epidermis was then sealed with Dermabond. The dermatotomy at the venous access site was also sealed with Dermabond. IMPRESSION: Successful placement of a left IJ approach Power Port with ultrasound and fluoroscopic guidance. The catheter is ready for use. Signed, Criselda Peaches, MD [facility] Radiology Electronically Signed   By: Jacqulynn Cadet M.D.   On: 08/04/2016 16:38    Impression/Plan: 1. 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 The patient has been recovering well from the effects of radiotherapy. She will return in about 2 months for her first post treatment brain MRI. She will keep [place] informed of any additional concerns prior to her next visit. 2. Candida/Mucositis with dysphagia. The patient has evidence concerning for candida of the oropharynx. I will send a prescription in for miracle mouthwash, and a refill of her carafate. She will keep [place] informed of questions or concerns, or progressive symptoms.     Carola Rhine, PAC

## 2016-08-24 ENCOUNTER — Ambulatory Visit (HOSPITAL_BASED_OUTPATIENT_CLINIC_OR_DEPARTMENT_OTHER): Payer: Medicare Other | Admitting: Nurse Practitioner

## 2016-08-24 ENCOUNTER — Other Ambulatory Visit: Payer: Self-pay | Admitting: Nurse Practitioner

## 2016-08-24 ENCOUNTER — Other Ambulatory Visit (HOSPITAL_BASED_OUTPATIENT_CLINIC_OR_DEPARTMENT_OTHER): Payer: Medicare Other

## 2016-08-24 ENCOUNTER — Other Ambulatory Visit: Payer: Self-pay | Admitting: *Deleted

## 2016-08-24 ENCOUNTER — Encounter: Payer: Self-pay | Admitting: Nurse Practitioner

## 2016-08-24 VITALS — BP 133/77 | HR 88 | Temp 97.8°F | Resp 18 | Wt 166.6 lb

## 2016-08-24 DIAGNOSIS — C3432 Malignant neoplasm of lower lobe, left bronchus or lung: Secondary | ICD-10-CM

## 2016-08-24 DIAGNOSIS — K209 Esophagitis, unspecified without bleeding: Secondary | ICD-10-CM

## 2016-08-24 DIAGNOSIS — E876 Hypokalemia: Secondary | ICD-10-CM | POA: Insufficient documentation

## 2016-08-24 DIAGNOSIS — C778 Secondary and unspecified malignant neoplasm of lymph nodes of multiple regions: Secondary | ICD-10-CM | POA: Diagnosis not present

## 2016-08-24 DIAGNOSIS — C50911 Malignant neoplasm of unspecified site of right female breast: Secondary | ICD-10-CM

## 2016-08-24 DIAGNOSIS — C7972 Secondary malignant neoplasm of left adrenal gland: Secondary | ICD-10-CM | POA: Diagnosis not present

## 2016-08-24 DIAGNOSIS — C801 Malignant (primary) neoplasm, unspecified: Principal | ICD-10-CM

## 2016-08-24 DIAGNOSIS — Z171 Estrogen receptor negative status [ER-]: Principal | ICD-10-CM

## 2016-08-24 DIAGNOSIS — C3491 Malignant neoplasm of unspecified part of right bronchus or lung: Secondary | ICD-10-CM

## 2016-08-24 LAB — COMPREHENSIVE METABOLIC PANEL
ALBUMIN: 3.3 g/dL — AB (ref 3.5–5.0)
ALK PHOS: 38 U/L — AB (ref 40–150)
ALT: 41 U/L (ref 0–55)
AST: 24 U/L (ref 5–34)
Anion Gap: 11 mEq/L (ref 3–11)
BILIRUBIN TOTAL: 1.77 mg/dL — AB (ref 0.20–1.20)
BUN: 23.3 mg/dL (ref 7.0–26.0)
CALCIUM: 9.5 mg/dL (ref 8.4–10.4)
CO2: 25 mEq/L (ref 22–29)
CREATININE: 0.7 mg/dL (ref 0.6–1.1)
Chloride: 103 mEq/L (ref 98–109)
EGFR: 87 mL/min/{1.73_m2} — ABNORMAL LOW (ref >90)
Glucose: 117 mg/dl (ref 70–140)
POTASSIUM: 3.4 meq/L — AB (ref 3.5–5.1)
Sodium: 139 mEq/L (ref 136–145)
TOTAL PROTEIN: 5.7 g/dL — AB (ref 6.4–8.3)

## 2016-08-24 LAB — CBC WITH DIFFERENTIAL/PLATELET
BASO%: 0.2 % (ref 0.0–2.0)
BASOS ABS: 0 10*3/uL (ref 0.0–0.1)
EOS ABS: 0 10*3/uL (ref 0.0–0.5)
EOS%: 0.1 % (ref 0.0–7.0)
HEMATOCRIT: 35.1 % (ref 34.8–46.6)
HEMOGLOBIN: 12.4 g/dL (ref 11.6–15.9)
LYMPH#: 0.1 10*3/uL — AB (ref 0.9–3.3)
LYMPH%: 1.5 % — ABNORMAL LOW (ref 14.0–49.7)
MCH: 34 pg (ref 25.1–34.0)
MCHC: 35.2 g/dL (ref 31.5–36.0)
MCV: 96.5 fL (ref 79.5–101.0)
MONO#: 0.6 10*3/uL (ref 0.1–0.9)
MONO%: 8.4 % (ref 0.0–14.0)
NEUT#: 6.7 10*3/uL — ABNORMAL HIGH (ref 1.5–6.5)
NEUT%: 89.8 % — ABNORMAL HIGH (ref 38.4–76.8)
Platelets: 117 10*3/uL — ABNORMAL LOW (ref 145–400)
RBC: 3.63 10*6/uL — ABNORMAL LOW (ref 3.70–5.45)
RDW: 16.8 % — AB (ref 11.2–14.5)
WBC: 7.5 10*3/uL (ref 3.9–10.3)

## 2016-08-24 MED ORDER — FIRST-DUKES MOUTHWASH MT SUSP: 15.0000 mL | Freq: Four times a day (QID) | OROMUCOSAL | 1 refills | Status: DC

## 2016-08-24 NOTE — Assessment & Plan Note (Signed)
Patient's potassium is slightly low at 3.4 today.  Patient was encouraged to push a high potassium diet.  We'll continue to monitor.
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
## 2016-08-24 NOTE — Assessment & Plan Note (Signed)
Patient states that her mucositis and esophagitis/thrush has greatly improved.  She states she still has a slight sore throat and occasionally had difficulty swallowing and chewing hard foods.  She states that she has been able to both drinking eat much better within the past few days.  She has been using Magic mouthwash with nystatin and Carafate.  She reports that she has remained hoarse; and the hoarseness is not improving.  She denies any recent fevers or chills.  Exam today reveals some trace thrush only to improve her mouth.  No other obvious oral lesions noted.  Posterior oropharynx clear with no erythema or exudate.  Patient is managing all secretions with no difficulty.  Discussed with the patient and her daughter that she could have the remnants of thrush/esophagitis and should continue with Magic mouthwash and Carafate his previously directed.  Patient was given a refill of the Magic mouthwash per her request today.  Also, reviewed all findings in details with Dr. Julien Nordmann; he agreed that the hoarseness could also be secondary to irritation of the phrenic nerve.  Patient was advised to call/return of electrical to the emergency department for any worsening symptoms whatsoever.

## 2016-08-24 NOTE — Assessment & Plan Note (Signed)
Patient received cycle one of her Alimta/Avastin chemotherapy therapy regimen on 08/10/2016.  She completed all of her radiation to her chest on 07/07/2017.  She is scheduled to return for labs, visit, and her next cycle of chemotherapy

## 2016-08-25 ENCOUNTER — Ambulatory Visit: Payer: Self-pay | Admitting: Radiation Oncology

## 2016-08-31 ENCOUNTER — Ambulatory Visit (HOSPITAL_BASED_OUTPATIENT_CLINIC_OR_DEPARTMENT_OTHER): Payer: Medicare Other | Admitting: Internal Medicine

## 2016-08-31 ENCOUNTER — Encounter: Payer: Self-pay | Admitting: *Deleted

## 2016-08-31 ENCOUNTER — Encounter: Payer: Self-pay | Admitting: Internal Medicine

## 2016-08-31 ENCOUNTER — Telehealth: Payer: Self-pay | Admitting: Internal Medicine

## 2016-08-31 ENCOUNTER — Other Ambulatory Visit: Payer: Medicare Other

## 2016-08-31 ENCOUNTER — Other Ambulatory Visit: Payer: Self-pay | Admitting: Internal Medicine

## 2016-08-31 ENCOUNTER — Ambulatory Visit: Payer: Self-pay | Admitting: Radiation Oncology

## 2016-08-31 ENCOUNTER — Other Ambulatory Visit (HOSPITAL_BASED_OUTPATIENT_CLINIC_OR_DEPARTMENT_OTHER): Payer: Medicare Other

## 2016-08-31 ENCOUNTER — Ambulatory Visit (HOSPITAL_BASED_OUTPATIENT_CLINIC_OR_DEPARTMENT_OTHER): Payer: Medicare Other

## 2016-08-31 VITALS — BP 136/91 | HR 86 | Temp 97.6°F | Resp 18 | Ht 62.0 in | Wt 167.7 lb

## 2016-08-31 VITALS — BP 132/83

## 2016-08-31 DIAGNOSIS — Z5111 Encounter for antineoplastic chemotherapy: Secondary | ICD-10-CM

## 2016-08-31 DIAGNOSIS — C7931 Secondary malignant neoplasm of brain: Secondary | ICD-10-CM

## 2016-08-31 DIAGNOSIS — C3431 Malignant neoplasm of lower lobe, right bronchus or lung: Secondary | ICD-10-CM | POA: Diagnosis not present

## 2016-08-31 DIAGNOSIS — C3491 Malignant neoplasm of unspecified part of right bronchus or lung: Secondary | ICD-10-CM

## 2016-08-31 DIAGNOSIS — C7972 Secondary malignant neoplasm of left adrenal gland: Secondary | ICD-10-CM

## 2016-08-31 DIAGNOSIS — C801 Malignant (primary) neoplasm, unspecified: Principal | ICD-10-CM

## 2016-08-31 DIAGNOSIS — Z5112 Encounter for antineoplastic immunotherapy: Secondary | ICD-10-CM

## 2016-08-31 LAB — CBC WITH DIFFERENTIAL/PLATELET
BASO%: 0.1 % (ref 0.0–2.0)
Basophils Absolute: 0 10*3/uL (ref 0.0–0.1)
EOS%: 0 % (ref 0.0–7.0)
Eosinophils Absolute: 0 10*3/uL (ref 0.0–0.5)
HCT: 31.7 % — ABNORMAL LOW (ref 34.8–46.6)
HEMOGLOBIN: 11.1 g/dL — AB (ref 11.6–15.9)
LYMPH%: 3.2 % — ABNORMAL LOW (ref 14.0–49.7)
MCH: 33.3 pg (ref 25.1–34.0)
MCHC: 35 g/dL (ref 31.5–36.0)
MCV: 95.2 fL (ref 79.5–101.0)
MONO#: 0.7 10*3/uL (ref 0.1–0.9)
MONO%: 9.3 % (ref 0.0–14.0)
NEUT%: 87.4 % — ABNORMAL HIGH (ref 38.4–76.8)
NEUTROS ABS: 6.8 10*3/uL — AB (ref 1.5–6.5)
Platelets: 145 10*3/uL (ref 145–400)
RBC: 3.33 10*6/uL — AB (ref 3.70–5.45)
RDW: 17.2 % — AB (ref 11.2–14.5)
WBC: 7.8 10*3/uL (ref 3.9–10.3)
lymph#: 0.3 10*3/uL — ABNORMAL LOW (ref 0.9–3.3)

## 2016-08-31 LAB — COMPREHENSIVE METABOLIC PANEL
ALT: 39 U/L (ref 0–55)
AST: 24 U/L (ref 5–34)
Albumin: 3.3 g/dL — ABNORMAL LOW (ref 3.5–5.0)
Alkaline Phosphatase: 36 U/L — ABNORMAL LOW (ref 40–150)
Anion Gap: 8 mEq/L (ref 3–11)
BILIRUBIN TOTAL: 1.19 mg/dL (ref 0.20–1.20)
BUN: 25.6 mg/dL (ref 7.0–26.0)
CO2: 23 meq/L (ref 22–29)
CREATININE: 0.7 mg/dL (ref 0.6–1.1)
Calcium: 9.6 mg/dL (ref 8.4–10.4)
Chloride: 109 mEq/L (ref 98–109)
EGFR: 86 mL/min/{1.73_m2} — ABNORMAL LOW (ref >90)
GLUCOSE: 108 mg/dL (ref 70–140)
Potassium: 4.1 mEq/L (ref 3.5–5.1)
SODIUM: 140 meq/L (ref 136–145)
TOTAL PROTEIN: 6 g/dL — AB (ref 6.4–8.3)

## 2016-08-31 LAB — UA PROTEIN, DIPSTICK - CHCC: Protein, ur: <30 mg/dL

## 2016-08-31 MED ORDER — HEPARIN SOD (PORK) LOCK FLUSH 100 UNIT/ML IV SOLN
500.0000 [IU] | Freq: Once | INTRAVENOUS | Status: AC | PRN
  Administered 2016-08-31: 500 [IU]
  Filled 2016-08-31: qty 5

## 2016-08-31 MED ORDER — PALONOSETRON HCL INJECTION 0.25 MG/5ML
INTRAVENOUS | Status: AC
  Filled 2016-08-31: qty 5

## 2016-08-31 MED ORDER — SODIUM CHLORIDE 0.9 % IV SOLN
421.0000 mg | Freq: Once | INTRAVENOUS | Status: AC
  Administered 2016-08-31: 420 mg via INTRAVENOUS
  Filled 2016-08-31: qty 42

## 2016-08-31 MED ORDER — SODIUM CHLORIDE 0.9 % IV SOLN
500.0000 mg/m2 | Freq: Once | INTRAVENOUS | Status: AC
  Administered 2016-08-31: 900 mg via INTRAVENOUS
  Filled 2016-08-31: qty 20

## 2016-08-31 MED ORDER — SODIUM CHLORIDE 0.9 % IV SOLN
Freq: Once | INTRAVENOUS | Status: AC
  Administered 2016-08-31: 12:00:00 via INTRAVENOUS

## 2016-08-31 MED ORDER — SODIUM CHLORIDE 0.9 % IV SOLN
Freq: Once | INTRAVENOUS | Status: AC
  Administered 2016-08-31: 12:00:00 via INTRAVENOUS
  Filled 2016-08-31: qty 5

## 2016-08-31 MED ORDER — SODIUM CHLORIDE 0.9% FLUSH
10.0000 mL | INTRAVENOUS | Status: DC | PRN
  Administered 2016-08-31: 10 mL
  Filled 2016-08-31: qty 10

## 2016-08-31 MED ORDER — SODIUM CHLORIDE 0.9 % IV SOLN
15.0000 mg/kg | Freq: Once | INTRAVENOUS | Status: AC
  Administered 2016-08-31: 1125 mg via INTRAVENOUS
  Filled 2016-08-31: qty 45

## 2016-08-31 MED ORDER — PALONOSETRON HCL INJECTION 0.25 MG/5ML
0.2500 mg | Freq: Once | INTRAVENOUS | Status: AC
  Administered 2016-08-31: 0.25 mg via INTRAVENOUS

## 2016-08-31 NOTE — Telephone Encounter (Signed)
Appointments scheduled per 08/31/16 los. Patient was given a copy of the appointment schedule and AVS report, per 08/31/16 los.
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
## 2016-08-31 NOTE — Patient Instructions (Signed)
[facility] Discharge Instructions for Patients Receiving Chemotherapy  Today you received the following chemotherapy agents Avastin, Alimta and Carboplatin   To help prevent nausea and vomiting after your treatment, we encourage you to take your nausea medication as directed. No Zofran for 3 days. Take Compazine instead.    If you develop nausea and vomiting that is not controlled by your nausea medication, call the clinic.   BELOW ARE SYMPTOMS THAT SHOULD BE REPORTED IMMEDIATELY:  *FEVER GREATER THAN 100.5 F  *CHILLS WITH OR WITHOUT FEVER  NAUSEA AND VOMITING THAT IS NOT CONTROLLED WITH YOUR NAUSEA MEDICATION  *UNUSUAL SHORTNESS OF BREATH  *UNUSUAL BRUISING OR BLEEDING  TENDERNESS IN MOUTH AND THROAT WITH OR WITHOUT PRESENCE OF ULCERS  *URINARY PROBLEMS  *BOWEL PROBLEMS  UNUSUAL RASH Items with * indicate a potential emergency and should be followed up as soon as possible.  Feel free to call the clinic you have any questions or concerns. The clinic phone number is [phone].  Please show the [place] at check-in to the Emergency Department and triage nurse.

## 2016-08-31 NOTE — Progress Notes (Signed)
Oncology Nurse Navigator Documentation  Oncology Nurse Navigator Flowsheets 08/31/2016  Navigator Location [facility]  Navigator Encounter Type Other/I received a call back from [facility].  Apparently, the referring order was not put in computer correctly and they were unaware of referral.  Molly Black gave me an appt for patient to be seen this Thursday at 7:00.  I printed off directions, map, and what to bring to the appt for Molly Black.  I gave and explained appt and printed information.  She was thankful for the help.   Treatment Phase Treatment  Barriers/Navigation Needs Coordination of Care;Education  Education Other  Interventions Coordination of Care  Coordination of Care Appts  Education Method Verbal;Written  Acuity Level 2 Educational needs;Assistance expediting appointments  Time Spent with Patient 17

## 2016-08-31 NOTE — Progress Notes (Signed)
Oncology Nurse Navigator Documentation  Oncology Nurse Navigator Flowsheets 08/31/2016  Navigator Location [facility]  Navigator Encounter Type Other/I spoke with patient and her sister today.  She is almost out of her anti-siezure mediation.  Per Dr. Worthy Flank request, I contacted [facility] to help get patient an appt with neurology.  I left vm message with Inez Catalina to call me with my name and phone number   Treatment Phase Treatment  Barriers/Navigation Needs Coordination of Care  Interventions Coordination of Care  Coordination of Care Appts  Acuity Level 2  Time Spent with Patient 30

## 2016-09-01 ENCOUNTER — Ambulatory Visit: Payer: Medicare Other | Admitting: Internal Medicine

## 2016-09-03 ENCOUNTER — Ambulatory Visit (INDEPENDENT_AMBULATORY_CARE_PROVIDER_SITE_OTHER): Payer: Medicare Other | Admitting: Neurology

## 2016-09-03 ENCOUNTER — Encounter: Payer: Self-pay | Admitting: Neurology

## 2016-09-03 VITALS — BP 115/74 | HR 91 | Resp 20 | Ht 62.0 in | Wt 168.0 lb

## 2016-09-03 DIAGNOSIS — C7931 Secondary malignant neoplasm of brain: Secondary | ICD-10-CM

## 2016-09-03 DIAGNOSIS — G40109 Localization-related (focal) (partial) symptomatic epilepsy and epileptic syndromes with simple partial seizures, not intractable, without status epilepticus: Secondary | ICD-10-CM

## 2016-09-03 MED ORDER — LACOSAMIDE 150 MG PO TABS: 150.0000 mg | ORAL_TABLET | Freq: Two times a day (BID) | ORAL | 11 refills | Status: DC
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
## 2016-09-03 NOTE — Progress Notes (Signed)
Vimpat rx printed, signed and faxed to pharmacy [facility]).

## 2016-09-03 NOTE — Patient Instructions (Addendum)
Remember to drink plenty of fluid, eat healthy meals and do not skip any meals. Try to eat protein with a every meal and eat a healthy snack such as fruit or nuts in between meals. Try to keep a regular sleep-wake schedule and try to exercise daily, particularly in the form of walking, 20-30 minutes a day, if you can.   As far as your medications are concerned, I would like to suggest: Continue Vimpat  I would like to see you back in 4 months, sooner if we need to. Please call us with any interim questions, concerns, problems, updates or refill requests.   Our phone number is [phone]. We also have an after hours call service for urgent matters and there is a physician on-call for urgent questions. For any emergencies you know to call 911 or go to the nearest emergency room  Lacosamide tablets What is this medicine? LACOSAMIDE (la KOE sa mide) is used to control seizures caused by certain types of epilepsy. COMMON BRAND NAME(S): Vimpat What should I tell my health care provider before I take this medicine? They need to know if you have any of these conditions: -dehydration -heart disease, including heart failure -history of a drug or alcohol abuse problem -kidney disease -liver disease -suicidal thoughts, plans, or attempt; a previous suicide attempt by you or a family member -an unusual or allergic reaction to lacosamide, other medicines, foods, dyes, or preservatives -pregnant or trying to get pregnant -breast-feeding How should I use this medicine? Take this medicine by mouth with a glass of water. You can take it with or without food. Follow the directions on the prescription label. Take your doses at regular intervals. Do not take your medicine more often than directed. Do not stop taking except on the advice of your doctor or health care professional. A special MedGuide will be given to you by the pharmacist with each prescription and refill. Be sure to read this information  carefully each time. Talk to your pediatrician regarding the use of this medicine in children. Special care may be needed. What if I miss a dose? If you miss a dose, take it as soon as you can. If it is almost time for your next dose, take only that dose. Do not take double or extra doses. What may interact with this medicine? -atazanavir -beta-blockers like metoprolol and propranolol -calcium channel blockers like diltiazem and verapamil -digoxin -dronedarone -lopinavir/ritonavir What should I watch for while using this medicine? Visit your doctor or health care professional for regular checks on your progress. This medicine needs careful monitoring. Wear a medical ID bracelet or chain, and carry a card that describes your disease and details of your medicine and dosage times. You may get drowsy or dizzy. Do not drive, use machinery, or do anything that needs mental alertness until you know how this medicine affects you. Do not stand or sit up quickly, especially if you are an older patient. This reduces the risk of dizzy or fainting spells. Alcohol may interfere with the effect of this medicine. Avoid alcoholic drinks. The use of this medicine may increase the chance of suicidal thoughts or actions. Pay special attention to how you are responding while on this medicine. Any worsening of mood, or thoughts of suicide or dying should be reported to your health care professional right away. Women who become pregnant while using this medicine may enroll in the [place] Pregnancy Registry by calling [phone]. This registry collects information about the  safety of antiepileptic drug use during pregnancy. What side effects may I notice from receiving this medicine? Side effects that you should report to your doctor or health care professional as soon as possible: -allergic reactions like skin rash, itching or hives, swelling of the face, lips, or  tongue -confusion -feeling faint or lightheaded, falls -fever -irregular heart beat -loss of memory -suicidal thoughts or other mood changes -unusually weak or tired -yellowing of the eyes, skin Side effects that usually do not require medical attention (report to your doctor or health care professional if they continue or are bothersome): -constipation -diarrhea -drowsiness -dry mouth -headache -nausea Where should I keep my medicine? Keep out of the reach of children. This medicine can be abused. Keep your medicine in a safe place to protect it from theft. Do not share this medicine with anyone. Selling or giving away this medicine is dangerous and against the law. This medicine may cause accidental overdose and death if it taken by other adults, children, or pets. Mix any unused medicine with a substance like cat litter or coffee grounds. Then throw the medicine away in a sealed container like a sealed bag or a coffee can with a lid. Do not use the medicine after the expiration date. Store at room temperature between 15 and 30 degrees C (59 and 86 degrees F).  2017 Elsevier/Gold Standard (2013-09-08 15:32:37)

## 2016-09-07 ENCOUNTER — Ambulatory Visit (HOSPITAL_BASED_OUTPATIENT_CLINIC_OR_DEPARTMENT_OTHER): Payer: Medicare Other | Admitting: Nurse Practitioner

## 2016-09-07 ENCOUNTER — Other Ambulatory Visit (HOSPITAL_BASED_OUTPATIENT_CLINIC_OR_DEPARTMENT_OTHER): Payer: Medicare Other

## 2016-09-07 VITALS — BP 121/85 | HR 102 | Temp 98.5°F | Ht 62.0 in | Wt 166.3 lb

## 2016-09-07 DIAGNOSIS — R29898 Other symptoms and signs involving the musculoskeletal system: Secondary | ICD-10-CM | POA: Diagnosis not present

## 2016-09-07 DIAGNOSIS — C7972 Secondary malignant neoplasm of left adrenal gland: Secondary | ICD-10-CM

## 2016-09-07 DIAGNOSIS — C3432 Malignant neoplasm of lower lobe, left bronchus or lung: Secondary | ICD-10-CM

## 2016-09-07 DIAGNOSIS — R530 Neoplastic (malignant) related fatigue: Secondary | ICD-10-CM | POA: Diagnosis not present

## 2016-09-07 DIAGNOSIS — C7931 Secondary malignant neoplasm of brain: Secondary | ICD-10-CM

## 2016-09-07 DIAGNOSIS — C801 Malignant (primary) neoplasm, unspecified: Principal | ICD-10-CM

## 2016-09-07 DIAGNOSIS — C3491 Malignant neoplasm of unspecified part of right bronchus or lung: Secondary | ICD-10-CM

## 2016-09-07 LAB — COMPREHENSIVE METABOLIC PANEL
ALT: 39 U/L (ref 0–55)
ANION GAP: 9 meq/L (ref 3–11)
AST: 38 U/L — ABNORMAL HIGH (ref 5–34)
Albumin: 3.4 g/dL — ABNORMAL LOW (ref 3.5–5.0)
Alkaline Phosphatase: 40 U/L (ref 40–150)
BUN: 28.3 mg/dL — ABNORMAL HIGH (ref 7.0–26.0)
CHLORIDE: 104 meq/L (ref 98–109)
CO2: 26 meq/L (ref 22–29)
CREATININE: 0.7 mg/dL (ref 0.6–1.1)
Calcium: 10 mg/dL (ref 8.4–10.4)
EGFR: 88 mL/min/{1.73_m2} — AB (ref >90)
GLUCOSE: 76 mg/dL (ref 70–140)
Potassium: 4.1 mEq/L (ref 3.5–5.1)
SODIUM: 140 meq/L (ref 136–145)
Total Bilirubin: 1.61 mg/dL — ABNORMAL HIGH (ref 0.20–1.20)
Total Protein: 6.2 g/dL — ABNORMAL LOW (ref 6.4–8.3)

## 2016-09-07 LAB — CBC WITH DIFFERENTIAL/PLATELET
BASO%: 0.8 % (ref 0.0–2.0)
Basophils Absolute: 0 10*3/uL (ref 0.0–0.1)
EOS ABS: 0 10*3/uL (ref 0.0–0.5)
EOS%: 0.8 % (ref 0.0–7.0)
HEMATOCRIT: 28.8 % — AB (ref 34.8–46.6)
HEMOGLOBIN: 10.4 g/dL — AB (ref 11.6–15.9)
LYMPH#: 0.2 10*3/uL — AB (ref 0.9–3.3)
LYMPH%: 7 % — ABNORMAL LOW (ref 14.0–49.7)
MCH: 33.4 pg (ref 25.1–34.0)
MCHC: 36.1 g/dL — ABNORMAL HIGH (ref 31.5–36.0)
MCV: 92.6 fL (ref 79.5–101.0)
MONO#: 0.5 10*3/uL (ref 0.1–0.9)
MONO%: 21 % — ABNORMAL HIGH (ref 0.0–14.0)
NEUT%: 69.6 % (ref 38.4–76.8)
NEUTROS ABS: 1.7 10*3/uL (ref 1.5–6.5)
PLATELETS: 93 10*3/uL — AB (ref 145–400)
RBC: 3.11 10*6/uL — AB (ref 3.70–5.45)
RDW: 15.9 % — ABNORMAL HIGH (ref 11.2–14.5)
WBC: 2.5 10*3/uL — AB (ref 3.9–10.3)

## 2016-09-07 LAB — TECHNOLOGIST REVIEW: Technologist Review: 4
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
## 2016-09-14 ENCOUNTER — Other Ambulatory Visit (HOSPITAL_BASED_OUTPATIENT_CLINIC_OR_DEPARTMENT_OTHER): Payer: Medicare Other

## 2016-09-14 DIAGNOSIS — C7972 Secondary malignant neoplasm of left adrenal gland: Secondary | ICD-10-CM

## 2016-09-14 DIAGNOSIS — C3431 Malignant neoplasm of lower lobe, right bronchus or lung: Secondary | ICD-10-CM | POA: Diagnosis not present

## 2016-09-14 DIAGNOSIS — C801 Malignant (primary) neoplasm, unspecified: Principal | ICD-10-CM

## 2016-09-14 LAB — COMPREHENSIVE METABOLIC PANEL
ALT: 32 U/L (ref 0–55)
AST: 31 U/L (ref 5–34)
Albumin: 2.9 g/dL — ABNORMAL LOW (ref 3.5–5.0)
Alkaline Phosphatase: 40 U/L (ref 40–150)
Anion Gap: 10 mEq/L (ref 3–11)
BUN: 18.4 mg/dL (ref 7.0–26.0)
CHLORIDE: 110 meq/L — AB (ref 98–109)
CO2: 23 meq/L (ref 22–29)
Calcium: 9.2 mg/dL (ref 8.4–10.4)
Creatinine: 0.7 mg/dL (ref 0.6–1.1)
EGFR: 87 mL/min/{1.73_m2} — AB (ref >90)
Glucose: 124 mg/dl (ref 70–140)
POTASSIUM: 3.7 meq/L (ref 3.5–5.1)
SODIUM: 142 meq/L (ref 136–145)
Total Bilirubin: 0.92 mg/dL (ref 0.20–1.20)
Total Protein: 5.7 g/dL — ABNORMAL LOW (ref 6.4–8.3)

## 2016-09-14 LAB — CBC WITH DIFFERENTIAL/PLATELET
BASO%: 0.2 % (ref 0.0–2.0)
Basophils Absolute: 0 10*3/uL (ref 0.0–0.1)
EOS%: 0.2 % (ref 0.0–7.0)
Eosinophils Absolute: 0 10*3/uL (ref 0.0–0.5)
HCT: 24.1 % — ABNORMAL LOW (ref 34.8–46.6)
HGB: 8.1 g/dL — ABNORMAL LOW (ref 11.6–15.9)
LYMPH%: 11.9 % — ABNORMAL LOW (ref 14.0–49.7)
MCH: 32.7 pg (ref 25.1–34.0)
MCHC: 33.6 g/dL (ref 31.5–36.0)
MCV: 97.2 fL (ref 79.5–101.0)
MONO#: 0.5 10*3/uL (ref 0.1–0.9)
MONO%: 10.6 % (ref 0.0–14.0)
NEUT#: 3.4 10*3/uL (ref 1.5–6.5)
NEUT%: 77.1 % — AB (ref 38.4–76.8)
Platelets: 128 10*3/uL — ABNORMAL LOW (ref 145–400)
RBC: 2.48 10*6/uL — AB (ref 3.70–5.45)
RDW: 19.6 % — ABNORMAL HIGH (ref 11.2–14.5)
WBC: 4.4 10*3/uL (ref 3.9–10.3)
lymph#: 0.5 10*3/uL — ABNORMAL LOW (ref 0.9–3.3)
nRBC: 2 % — ABNORMAL HIGH (ref 0–0)

## 2016-09-21 ENCOUNTER — Ambulatory Visit (HOSPITAL_BASED_OUTPATIENT_CLINIC_OR_DEPARTMENT_OTHER): Payer: Medicare Other | Admitting: Internal Medicine

## 2016-09-21 ENCOUNTER — Ambulatory Visit: Payer: Medicare Other | Admitting: Nutrition

## 2016-09-21 ENCOUNTER — Other Ambulatory Visit: Payer: Self-pay | Admitting: Medical Oncology

## 2016-09-21 ENCOUNTER — Telehealth: Payer: Self-pay | Admitting: Internal Medicine

## 2016-09-21 ENCOUNTER — Ambulatory Visit (HOSPITAL_BASED_OUTPATIENT_CLINIC_OR_DEPARTMENT_OTHER): Payer: Medicare Other

## 2016-09-21 ENCOUNTER — Ambulatory Visit (HOSPITAL_COMMUNITY)
Admission: RE | Admit: 2016-09-21 | Discharge: 2016-09-21 | Disposition: A | Discharge status: Home / Self Care | Payer: Medicare Other | Source: Ambulatory Visit | Attending: Internal Medicine | Admitting: Internal Medicine

## 2016-09-21 ENCOUNTER — Encounter: Payer: Self-pay | Admitting: Internal Medicine

## 2016-09-21 ENCOUNTER — Other Ambulatory Visit (HOSPITAL_BASED_OUTPATIENT_CLINIC_OR_DEPARTMENT_OTHER): Payer: Medicare Other

## 2016-09-21 ENCOUNTER — Ambulatory Visit: Payer: Medicare Other

## 2016-09-21 VITALS — BP 143/71 | HR 78 | Temp 98.0°F | Resp 18 | Ht 62.0 in | Wt 170.4 lb

## 2016-09-21 VITALS — BP 129/80

## 2016-09-21 DIAGNOSIS — D6481 Anemia due to antineoplastic chemotherapy: Secondary | ICD-10-CM | POA: Diagnosis not present

## 2016-09-21 DIAGNOSIS — C7972 Secondary malignant neoplasm of left adrenal gland: Secondary | ICD-10-CM

## 2016-09-21 DIAGNOSIS — Z5111 Encounter for antineoplastic chemotherapy: Secondary | ICD-10-CM | POA: Diagnosis not present

## 2016-09-21 DIAGNOSIS — C3431 Malignant neoplasm of lower lobe, right bronchus or lung: Secondary | ICD-10-CM

## 2016-09-21 DIAGNOSIS — D649 Anemia, unspecified: Secondary | ICD-10-CM | POA: Insufficient documentation

## 2016-09-21 DIAGNOSIS — C7931 Secondary malignant neoplasm of brain: Secondary | ICD-10-CM

## 2016-09-21 DIAGNOSIS — Z5112 Encounter for antineoplastic immunotherapy: Secondary | ICD-10-CM | POA: Diagnosis not present

## 2016-09-21 DIAGNOSIS — C3491 Malignant neoplasm of unspecified part of right bronchus or lung: Secondary | ICD-10-CM

## 2016-09-21 DIAGNOSIS — I10 Essential (primary) hypertension: Secondary | ICD-10-CM

## 2016-09-21 DIAGNOSIS — C801 Malignant (primary) neoplasm, unspecified: Principal | ICD-10-CM

## 2016-09-21 DIAGNOSIS — T451X5A Adverse effect of antineoplastic and immunosuppressive drugs, initial encounter: Secondary | ICD-10-CM

## 2016-09-21 HISTORY — DX: Anemia due to antineoplastic chemotherapy: D64.81

## 2016-09-21 HISTORY — DX: Adverse effect of antineoplastic and immunosuppressive drugs, initial encounter: T45.1X5A

## 2016-09-21 LAB — COMPREHENSIVE METABOLIC PANEL
ALT: 21 U/L (ref 0–55)
ANION GAP: 9 meq/L (ref 3–11)
AST: 24 U/L (ref 5–34)
Albumin: 3 g/dL — ABNORMAL LOW (ref 3.5–5.0)
Alkaline Phosphatase: 33 U/L — ABNORMAL LOW (ref 40–150)
BUN: 18.9 mg/dL (ref 7.0–26.0)
CALCIUM: 9.6 mg/dL (ref 8.4–10.4)
CO2: 25 mEq/L (ref 22–29)
CREATININE: 0.7 mg/dL (ref 0.6–1.1)
Chloride: 110 mEq/L — ABNORMAL HIGH (ref 98–109)
EGFR: 88 mL/min/{1.73_m2} — ABNORMAL LOW (ref >90)
Glucose: 101 mg/dl (ref 70–140)
Potassium: 3.7 mEq/L (ref 3.5–5.1)
Sodium: 143 mEq/L (ref 136–145)
TOTAL PROTEIN: 5.7 g/dL — AB (ref 6.4–8.3)
Total Bilirubin: 0.88 mg/dL (ref 0.20–1.20)

## 2016-09-21 LAB — CBC WITH DIFFERENTIAL/PLATELET
BASO%: 0.1 % (ref 0.0–2.0)
Basophils Absolute: 0 10*3/uL (ref 0.0–0.1)
EOS ABS: 0 10*3/uL (ref 0.0–0.5)
EOS%: 0 % (ref 0.0–7.0)
HEMATOCRIT: 26 % — AB (ref 34.8–46.6)
HGB: 8.4 g/dL — ABNORMAL LOW (ref 11.6–15.9)
LYMPH%: 7.2 % — AB (ref 14.0–49.7)
MCH: 32.2 pg (ref 25.1–34.0)
MCHC: 32.3 g/dL (ref 31.5–36.0)
MCV: 99.6 fL (ref 79.5–101.0)
MONO#: 0.6 10*3/uL (ref 0.1–0.9)
MONO%: 8.4 % (ref 0.0–14.0)
NEUT%: 84.3 % — ABNORMAL HIGH (ref 38.4–76.8)
NEUTROS ABS: 5.6 10*3/uL (ref 1.5–6.5)
NRBC: 1 % — AB (ref 0–0)
PLATELETS: 161 10*3/uL (ref 145–400)
RBC: 2.61 10*6/uL — ABNORMAL LOW (ref 3.70–5.45)
RDW: 20.1 % — ABNORMAL HIGH (ref 11.2–14.5)
WBC: 6.7 10*3/uL (ref 3.9–10.3)
lymph#: 0.5 10*3/uL — ABNORMAL LOW (ref 0.9–3.3)

## 2016-09-21 LAB — ABO/RH: ABO/RH(D): O NEG

## 2016-09-21 LAB — PREPARE RBC (CROSSMATCH)

## 2016-09-21 LAB — UA PROTEIN, DIPSTICK - CHCC

## 2016-09-21 MED ORDER — PALONOSETRON HCL INJECTION 0.25 MG/5ML
INTRAVENOUS | Status: AC
  Filled 2016-09-21: qty 5

## 2016-09-21 MED ORDER — PALONOSETRON HCL INJECTION 0.25 MG/5ML
0.2500 mg | Freq: Once | INTRAVENOUS | Status: AC
  Administered 2016-09-21: 0.25 mg via INTRAVENOUS

## 2016-09-21 MED ORDER — FOSAPREPITANT DIMEGLUMINE INJECTION 150 MG
Freq: Once | INTRAVENOUS | Status: AC
  Administered 2016-09-21: 12:00:00 via INTRAVENOUS
  Filled 2016-09-21: qty 5

## 2016-09-21 MED ORDER — SODIUM CHLORIDE 0.9 % IV SOLN
500.0000 mg/m2 | Freq: Once | INTRAVENOUS | Status: AC
  Administered 2016-09-21: 900 mg via INTRAVENOUS
  Filled 2016-09-21: qty 20

## 2016-09-21 MED ORDER — SODIUM CHLORIDE 0.9 % IV SOLN
Freq: Once | INTRAVENOUS | Status: AC
  Administered 2016-09-21: 11:00:00 via INTRAVENOUS

## 2016-09-21 MED ORDER — SODIUM CHLORIDE 0.9% FLUSH
10.0000 mL | INTRAVENOUS | Status: DC | PRN
  Administered 2016-09-21: 10 mL
  Filled 2016-09-21: qty 10

## 2016-09-21 MED ORDER — SODIUM CHLORIDE 0.9 % IV SOLN
15.0000 mg/kg | Freq: Once | INTRAVENOUS | Status: AC
  Administered 2016-09-21: 1125 mg via INTRAVENOUS
  Filled 2016-09-21: qty 45

## 2016-09-21 MED ORDER — HEPARIN SOD (PORK) LOCK FLUSH 100 UNIT/ML IV SOLN
500.0000 [IU] | Freq: Once | INTRAVENOUS | Status: AC | PRN
  Administered 2016-09-21: 500 [IU]
  Filled 2016-09-21: qty 5

## 2016-09-21 MED ORDER — CARBOPLATIN CHEMO INJECTION 600 MG/60ML
421.0000 mg | Freq: Once | INTRAVENOUS | Status: AC
  Administered 2016-09-21: 420 mg via INTRAVENOUS
  Filled 2016-09-21: qty 42

## 2016-09-21 NOTE — Telephone Encounter (Signed)
Appointments scheduled per 2/26 LOS. Patient given AVS report and calendars with future scheduled appointments. °
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
## 2016-09-21 NOTE — Patient Instructions (Signed)
[facility] Discharge Instructions for Patients Receiving Chemotherapy  Today you received the following chemotherapy agents Avastin, Alimta and Carboplatin   To help prevent nausea and vomiting after your treatment, we encourage you to take your nausea medication as directed. No Zofran for 3 days. Take Compazine instead.    If you develop nausea and vomiting that is not controlled by your nausea medication, call the clinic.   BELOW ARE SYMPTOMS THAT SHOULD BE REPORTED IMMEDIATELY:  *FEVER GREATER THAN 100.5 F  *CHILLS WITH OR WITHOUT FEVER  NAUSEA AND VOMITING THAT IS NOT CONTROLLED WITH YOUR NAUSEA MEDICATION  *UNUSUAL SHORTNESS OF BREATH  *UNUSUAL BRUISING OR BLEEDING  TENDERNESS IN MOUTH AND THROAT WITH OR WITHOUT PRESENCE OF ULCERS  *URINARY PROBLEMS  *BOWEL PROBLEMS  UNUSUAL RASH Items with * indicate a potential emergency and should be followed up as soon as possible.  Feel free to call the clinic you have any questions or concerns. The clinic phone number is [phone].  Please show the [facility] at check-in to the Emergency Department and triage nurse.

## 2016-09-21 NOTE — Progress Notes (Signed)
Patient was identified on the malnutrition risk screen to be at risk for malnutrition secondary to weight loss and poor appetite.  Patient is a 76 year old female diagnosed with non-small cell lung cancer with brain metastases.  Past medical history includes seizures, odonyphagia, hiatal hernia, GERD, breast cancer, stage II, and anxiety.  Medications include calcium with vitamin D, vitamin B12, Decadron, Folvite, multivitamin, fish oil, Prilosec, probiotics, Compazine, Carafate.  Labs include albumin 3.0 on February 26.  Height: 5 feet 2 inches. Weight: 170.4 pounds today improved from 166.3 pounds on February 12. Usual body weight: 169 pounds November 2017. BMI: 31.17.  Patient has recently regained approximately 4 pounds.  Her appetite has improved. She reports she struggles with constipation. She takes MiraLAX daily for this. She is drinking one ensure daily. Denies other nutrition impact symptoms.  Nutrition diagnosis:  Food and nutrition related knowledge deficit related to lung cancer and associated treatments as evidenced by no prior need for nutrition related information.  Intervention: Patient was educated on the importance of smaller more frequent meals and snacks with high protein foods to strive for weight maintenance. Educated patient to continue one ensure daily and provided samples and coupons. Questions were answered.  Teach back method used.  Contact information provided.  Monitoring, evaluation, goals:  Patient will tolerate increased calories and protein for weight maintenance.  Nutrition diagnosis. No follow-up required at this time.  **Disclaimer: This note was dictated with voice recognition software. Similar sounding words can inadvertently be transcribed and this note may contain transcription errors which may not have been corrected upon publication of note.**

## 2016-09-24 ENCOUNTER — Ambulatory Visit (HOSPITAL_COMMUNITY)
Admission: RE | Admit: 2016-09-24 | Discharge: 2016-09-24 | Disposition: A | Discharge status: Home / Self Care | Payer: Medicare Other | Source: Ambulatory Visit | Attending: Internal Medicine | Admitting: Internal Medicine

## 2016-09-24 ENCOUNTER — Ambulatory Visit (HOSPITAL_BASED_OUTPATIENT_CLINIC_OR_DEPARTMENT_OTHER): Payer: Medicare Other

## 2016-09-24 DIAGNOSIS — D6481 Anemia due to antineoplastic chemotherapy: Secondary | ICD-10-CM

## 2016-09-24 DIAGNOSIS — D649 Anemia, unspecified: Secondary | ICD-10-CM | POA: Diagnosis present

## 2016-09-24 DIAGNOSIS — C3431 Malignant neoplasm of lower lobe, right bronchus or lung: Secondary | ICD-10-CM | POA: Diagnosis not present

## 2016-09-24 MED ORDER — ACETAMINOPHEN 325 MG PO TABS
650.0000 mg | ORAL_TABLET | Freq: Once | ORAL | Status: AC
  Administered 2016-09-24: 650 mg via ORAL

## 2016-09-24 MED ORDER — DIPHENHYDRAMINE HCL 25 MG PO CAPS
25.0000 mg | ORAL_CAPSULE | Freq: Once | ORAL | Status: AC
  Administered 2016-09-24: 25 mg via ORAL

## 2016-09-24 MED ORDER — SODIUM CHLORIDE 0.9 % IV SOLN
250.0000 mL | Freq: Once | INTRAVENOUS | Status: AC
  Administered 2016-09-24: 250 mL via INTRAVENOUS

## 2016-09-24 MED ORDER — DIPHENHYDRAMINE HCL 25 MG PO CAPS
ORAL_CAPSULE | ORAL | Status: AC
Start: 2016-09-24 — End: 2016-09-24
  Filled 2016-09-24: qty 1

## 2016-09-24 MED ORDER — SODIUM CHLORIDE 0.9% FLUSH
10.0000 mL | INTRAVENOUS | Status: AC | PRN
  Administered 2016-09-24: 10 mL
  Filled 2016-09-24: qty 10

## 2016-09-24 MED ORDER — HEPARIN SOD (PORK) LOCK FLUSH 100 UNIT/ML IV SOLN
500.0000 [IU] | Freq: Every day | INTRAVENOUS | Status: AC | PRN
Start: 2016-09-24 — End: 2016-09-24
  Administered 2016-09-24: 500 [IU]
  Filled 2016-09-24: qty 5

## 2016-09-24 MED ORDER — ACETAMINOPHEN 325 MG PO TABS
ORAL_TABLET | ORAL | Status: AC
  Filled 2016-09-24: qty 2

## 2016-09-24 NOTE — Patient Instructions (Signed)
Blood Transfusion , Adult A blood transfusion is a procedure in which you receive donated blood, including plasma, platelets, and red blood cells, through an IV tube. You may need a blood transfusion because of illness, surgery, or injury. The blood may come from a donor. You may also be able to donate blood for yourself (autologous blood donation) before a surgery if you know that you might require a blood transfusion. The blood given in a transfusion is made up of different types of cells. You may receive:  Red blood cells. These carry oxygen to the cells in the body.  White blood cells. These help you fight infections.  Platelets. These help your blood to clot.  Plasma. This is the liquid part of your blood and it helps with fluid imbalances. If you have hemophilia or another clotting disorder, you may also receive other types of blood products. Tell a health care provider about:  Any allergies you have.  All medicines you are taking, including vitamins, herbs, eye drops, creams, and over-the-counter medicines.  Any problems you or family members have had with anesthetic medicines.  Any blood disorders you have.  Any surgeries you have had.  Any medical conditions you have, including any recent fever or cold symptoms.  Whether you are pregnant or may be pregnant.  Any previous reactions you have had during a blood transfusion. What are the risks? Generally, this is a safe procedure. However, problems may occur, including:  Having an allergic reaction to something in the donated blood. Hives and itching may be symptoms of this type of reaction.  Fever. This may be a reaction to the white blood cells in the transfused blood. Nausea or chest pain may accompany a fever.  Iron overload. This can happen from having many transfusions.  Transfusion-related acute lung injury (TRALI). This is a rare reaction that causes lung damage. The cause is not known.TRALI can occur within hours  of a transfusion or several days later.  Sudden (acute) or delayed hemolytic reactions. This happens if your blood does not match the cells in your transfusion. Your body's defense system (immune system) may try to attack the new cells. This complication is rare. The symptoms include fever, chills, nausea, and low back pain or chest pain.  Infection or disease transmission. This is rare. What happens before the procedure?  You will have a blood test to determine your blood type. This is necessary to know what kind of blood your body will accept and to match it to the donor blood.  If you are going to have a planned surgery, you may be able to do an autologous blood donation. This may be done in case you need to have a transfusion.  If you have had an allergic reaction to a transfusion in the past, you may be given medicine to help prevent a reaction. This medicine may be given to you by mouth or through an IV tube.  You will have your temperature, blood pressure, and pulse monitored before the transfusion.  Follow instructions from your health care provider about eating and drinking restrictions.  Ask your health care provider about:  Changing or stopping your regular medicines. This is especially important if you are taking diabetes medicines or blood thinners.  Taking medicines such as aspirin and ibuprofen. These medicines can thin your blood. Do not take these medicines before your procedure if your health care provider instructs you not to. What happens during the procedure?  An IV tube will be   inserted into one of your veins.  The bag of donated blood will be attached to your IV tube. The blood will then enter through your vein.  Your temperature, blood pressure, and pulse will be monitored regularly during the transfusion. This monitoring is done to detect early signs of a transfusion reaction.  If you have any signs or symptoms of a reaction, your transfusion will be stopped and  you may be given medicine.  When the transfusion is complete, your IV tube will be removed.  Pressure may be applied to the IV site for a few minutes.  A bandage (dressing) will be applied. The procedure may vary among health care providers and hospitals. What happens after the procedure?  Your temperature, blood pressure, heart rate, breathing rate, and blood oxygen level will be monitored often.  Your blood may be tested to see how you are responding to the transfusion.  You may be warmed with fluids or blankets to maintain a normal body temperature. Summary  A blood transfusion is a procedure in which you receive donated blood, including plasma, platelets, and red blood cells, through an IV tube.  Your temperature, blood pressure, and pulse will be monitored before, during, and after the transfusion.  Your blood may be tested after the transfusion to see how your body has responded. This information is not intended to replace advice given to you by your health care provider. Make sure you discuss any questions you have with your health care provider. Document Released: 07/10/2000 Document Revised: 04/09/2016 Document Reviewed: 04/09/2016 Elsevier Interactive Patient Education  2017 [employer]  

## 2016-09-25 LAB — TYPE AND SCREEN
ABO/RH(D): O NEG
Antibody Screen: NEGATIVE
Unit division: 0
Unit division: 0

## 2016-09-25 LAB — BPAM RBC
BLOOD PRODUCT EXPIRATION DATE: 201803162359
Blood Product Expiration Date: 201803162359
ISSUE DATE / TIME: 201803010933
ISSUE DATE / TIME: 201803010933
UNIT TYPE AND RH: 9500
Unit Type and Rh: 9500

## 2016-09-28 ENCOUNTER — Other Ambulatory Visit (HOSPITAL_BASED_OUTPATIENT_CLINIC_OR_DEPARTMENT_OTHER): Payer: Medicare Other

## 2016-09-28 DIAGNOSIS — C801 Malignant (primary) neoplasm, unspecified: Principal | ICD-10-CM

## 2016-09-28 DIAGNOSIS — C3431 Malignant neoplasm of lower lobe, right bronchus or lung: Secondary | ICD-10-CM | POA: Diagnosis not present

## 2016-09-28 DIAGNOSIS — C7972 Secondary malignant neoplasm of left adrenal gland: Secondary | ICD-10-CM

## 2016-09-28 LAB — CBC WITH DIFFERENTIAL/PLATELET
BASO%: 1.5 % (ref 0.0–2.0)
BASOS ABS: 0 10*3/uL (ref 0.0–0.1)
EOS ABS: 0.1 10*3/uL (ref 0.0–0.5)
EOS%: 2.7 % (ref 0.0–7.0)
HCT: 32.8 % — ABNORMAL LOW (ref 34.8–46.6)
HGB: 11.1 g/dL — ABNORMAL LOW (ref 11.6–15.9)
LYMPH%: 13.7 % — AB (ref 14.0–49.7)
MCH: 30.8 pg (ref 25.1–34.0)
MCHC: 34 g/dL (ref 31.5–36.0)
MCV: 90.7 fL (ref 79.5–101.0)
MONO#: 0.4 10*3/uL (ref 0.1–0.9)
MONO%: 22 % — AB (ref 0.0–14.0)
NEUT#: 1.2 10*3/uL — ABNORMAL LOW (ref 1.5–6.5)
NEUT%: 60.1 % (ref 38.4–76.8)
PLATELETS: 101 10*3/uL — AB (ref 145–400)
RBC: 3.62 10*6/uL — AB (ref 3.70–5.45)
RDW: 19.1 % — ABNORMAL HIGH (ref 11.2–14.5)
WBC: 1.9 10*3/uL — ABNORMAL LOW (ref 3.9–10.3)
lymph#: 0.3 10*3/uL — ABNORMAL LOW (ref 0.9–3.3)

## 2016-09-28 LAB — COMPREHENSIVE METABOLIC PANEL
ALBUMIN: 3.2 g/dL — AB (ref 3.5–5.0)
ALT: 117 U/L — ABNORMAL HIGH (ref 0–55)
ANION GAP: 8 meq/L (ref 3–11)
AST: 102 U/L — ABNORMAL HIGH (ref 5–34)
Alkaline Phosphatase: 40 U/L (ref 40–150)
BILIRUBIN TOTAL: 0.95 mg/dL (ref 0.20–1.20)
BUN: 17.3 mg/dL (ref 7.0–26.0)
CO2: 28 meq/L (ref 22–29)
Calcium: 9.6 mg/dL (ref 8.4–10.4)
Chloride: 104 mEq/L (ref 98–109)
Creatinine: 0.6 mg/dL (ref 0.6–1.1)
GLUCOSE: 80 mg/dL (ref 70–140)
POTASSIUM: 3.5 meq/L (ref 3.5–5.1)
SODIUM: 140 meq/L (ref 136–145)
TOTAL PROTEIN: 6 g/dL — AB (ref 6.4–8.3)

## 2016-10-05 ENCOUNTER — Ambulatory Visit (HOSPITAL_COMMUNITY)
Admission: RE | Admit: 2016-10-05 | Discharge: 2016-10-05 | Disposition: A | Discharge status: Home / Self Care | Payer: Medicare Other | Source: Ambulatory Visit | Attending: Internal Medicine | Admitting: Internal Medicine

## 2016-10-05 ENCOUNTER — Other Ambulatory Visit (HOSPITAL_BASED_OUTPATIENT_CLINIC_OR_DEPARTMENT_OTHER): Payer: Medicare Other

## 2016-10-05 DIAGNOSIS — T451X5A Adverse effect of antineoplastic and immunosuppressive drugs, initial encounter: Secondary | ICD-10-CM | POA: Insufficient documentation

## 2016-10-05 DIAGNOSIS — I2510 Atherosclerotic heart disease of native coronary artery without angina pectoris: Secondary | ICD-10-CM | POA: Diagnosis not present

## 2016-10-05 DIAGNOSIS — I700 Atherosclerosis of aorta: Secondary | ICD-10-CM | POA: Diagnosis not present

## 2016-10-05 DIAGNOSIS — Y842 Radiological procedure and radiotherapy as the cause of abnormal reaction of the patient, or of later complication, without mention of misadventure at the time of the procedure: Secondary | ICD-10-CM | POA: Insufficient documentation

## 2016-10-05 DIAGNOSIS — C7972 Secondary malignant neoplasm of left adrenal gland: Secondary | ICD-10-CM

## 2016-10-05 DIAGNOSIS — J432 Centrilobular emphysema: Secondary | ICD-10-CM | POA: Insufficient documentation

## 2016-10-05 DIAGNOSIS — K5730 Diverticulosis of large intestine without perforation or abscess without bleeding: Secondary | ICD-10-CM | POA: Diagnosis not present

## 2016-10-05 DIAGNOSIS — J90 Pleural effusion, not elsewhere classified: Secondary | ICD-10-CM | POA: Diagnosis not present

## 2016-10-05 DIAGNOSIS — C7931 Secondary malignant neoplasm of brain: Secondary | ICD-10-CM | POA: Insufficient documentation

## 2016-10-05 DIAGNOSIS — C3491 Malignant neoplasm of unspecified part of right bronchus or lung: Secondary | ICD-10-CM | POA: Insufficient documentation

## 2016-10-05 DIAGNOSIS — D6481 Anemia due to antineoplastic chemotherapy: Secondary | ICD-10-CM | POA: Diagnosis present

## 2016-10-05 DIAGNOSIS — R590 Localized enlarged lymph nodes: Secondary | ICD-10-CM | POA: Insufficient documentation

## 2016-10-05 DIAGNOSIS — Z5111 Encounter for antineoplastic chemotherapy: Secondary | ICD-10-CM

## 2016-10-05 DIAGNOSIS — C3431 Malignant neoplasm of lower lobe, right bronchus or lung: Secondary | ICD-10-CM

## 2016-10-05 DIAGNOSIS — C801 Malignant (primary) neoplasm, unspecified: Principal | ICD-10-CM

## 2016-10-05 LAB — COMPREHENSIVE METABOLIC PANEL
ALK PHOS: 42 U/L (ref 40–150)
ALT: 100 U/L — ABNORMAL HIGH (ref 0–55)
AST: 64 U/L — AB (ref 5–34)
Albumin: 3.5 g/dL (ref 3.5–5.0)
Anion Gap: 10 mEq/L (ref 3–11)
BILIRUBIN TOTAL: 1.16 mg/dL (ref 0.20–1.20)
BUN: 9 mg/dL (ref 7.0–26.0)
CO2: 27 meq/L (ref 22–29)
CREATININE: 0.6 mg/dL (ref 0.6–1.1)
Calcium: 9.8 mg/dL (ref 8.4–10.4)
Chloride: 106 mEq/L (ref 98–109)
EGFR: 88 mL/min/{1.73_m2} — ABNORMAL LOW (ref >90)
Glucose: 94 mg/dl (ref 70–140)
POTASSIUM: 3.3 meq/L — AB (ref 3.5–5.1)
SODIUM: 143 meq/L (ref 136–145)
Total Protein: 6.3 g/dL — ABNORMAL LOW (ref 6.4–8.3)

## 2016-10-05 LAB — CBC WITH DIFFERENTIAL/PLATELET
BASO%: 0 % (ref 0.0–2.0)
BASOS ABS: 0 10*3/uL (ref 0.0–0.1)
EOS%: 0.9 % (ref 0.0–7.0)
Eosinophils Absolute: 0 10*3/uL (ref 0.0–0.5)
HCT: 34.2 % — ABNORMAL LOW (ref 34.8–46.6)
HEMOGLOBIN: 11.2 g/dL — AB (ref 11.6–15.9)
LYMPH%: 9.2 % — ABNORMAL LOW (ref 14.0–49.7)
MCH: 30.4 pg (ref 25.1–34.0)
MCHC: 32.7 g/dL (ref 31.5–36.0)
MCV: 92.9 fL (ref 79.5–101.0)
MONO#: 0.9 10*3/uL (ref 0.1–0.9)
MONO%: 19.1 % — ABNORMAL HIGH (ref 0.0–14.0)
NEUT#: 3.2 10*3/uL (ref 1.5–6.5)
NEUT%: 70.8 % (ref 38.4–76.8)
Platelets: 112 10*3/uL — ABNORMAL LOW (ref 145–400)
RBC: 3.68 10*6/uL — ABNORMAL LOW (ref 3.70–5.45)
RDW: 20.3 % — AB (ref 11.2–14.5)
WBC: 4.6 10*3/uL (ref 3.9–10.3)
lymph#: 0.4 10*3/uL — ABNORMAL LOW (ref 0.9–3.3)

## 2016-10-05 MED ORDER — IOPAMIDOL (ISOVUE-300) INJECTION 61%
INTRAVENOUS | Status: AC
  Filled 2016-10-05: qty 100

## 2016-10-05 MED ORDER — IOPAMIDOL (ISOVUE-300) INJECTION 61%
100.0000 mL | Freq: Once | INTRAVENOUS | Status: AC | PRN
  Administered 2016-10-05: 100 mL via INTRAVENOUS

## 2016-10-07 ENCOUNTER — Encounter: Payer: Self-pay | Admitting: Nurse Practitioner

## 2016-10-07 ENCOUNTER — Ambulatory Visit (HOSPITAL_BASED_OUTPATIENT_CLINIC_OR_DEPARTMENT_OTHER): Payer: Medicare Other | Admitting: Nurse Practitioner

## 2016-10-07 VITALS — BP 156/82 | HR 97 | Temp 97.6°F | Resp 18 | Wt 169.5 lb

## 2016-10-07 DIAGNOSIS — M542 Cervicalgia: Secondary | ICD-10-CM | POA: Diagnosis not present

## 2016-10-07 DIAGNOSIS — C3491 Malignant neoplasm of unspecified part of right bronchus or lung: Secondary | ICD-10-CM

## 2016-10-07 DIAGNOSIS — E876 Hypokalemia: Secondary | ICD-10-CM | POA: Diagnosis not present

## 2016-10-07 DIAGNOSIS — C770 Secondary and unspecified malignant neoplasm of lymph nodes of head, face and neck: Secondary | ICD-10-CM | POA: Diagnosis not present

## 2016-10-07 MED ORDER — CYCLOBENZAPRINE HCL 5 MG PO TABS: 5.0000 mg | ORAL_TABLET | Freq: Three times a day (TID) | ORAL | 0 refills | Status: DC | PRN

## 2016-10-07 MED ORDER — OXYCODONE-ACETAMINOPHEN 5-325 MG PO TABS
ORAL_TABLET | ORAL | Status: AC
  Filled 2016-10-07: qty 1

## 2016-10-07 MED ORDER — OXYCODONE-ACETAMINOPHEN 5-325 MG PO TABS
0.5000 | ORAL_TABLET | Freq: Once | ORAL | Status: AC
  Administered 2016-10-07: 0.5 via ORAL

## 2016-10-07 NOTE — Assessment & Plan Note (Signed)
Patient presented to the [facility] today with complaint of right neck pain.  She states that she's had no known injury or trauma recently.  She denies any recent falls.  She states that she awoke Tuesday morning.  10/06/2016 with significant right neck pain.  She states that the neck pain has greatly improved within the past 24 hours, however.  With further questioning-patient admits that she has had several episodes of awakening with neck pain in the past.  She states that it always improves within 1-2 days.  She denies any other neurological symptoms whatsoever.  On exam today.  Patient does appear slightly uncomfortable when turning her head to the right or up and down.  Patient has no tenderness to her cervical spine with palpation; but does have some tenderness with palpation to the right posterior shoulder region.  She has full range of motion with her right arm and shoulder.  Patient does report that the discomfort to her right neck and shoulder area as slowly been improving since yesterday.  She has only taken Tylenol for the discomfort.  Note: Patient underwent a restaging CT of the chest/abdomen/pelvis just this past Monday, 10/05/2016 which revealed stable or improving disease; and no specific acute neck issues.  Reviewed all symptoms with Dr. Julien Nordmann; and he recommended treating the patient symptomatically.  Patient was advised that she may try only occasional ibuprofen for the inflammation; and will also prescribe Flexeril for muscle spasms.  Patient was given one half of a Percocet tablet while the cancer centers well.  Patient will be driven home by a family member today.  Patient was advised to call/return or go directly to the emergency department for any worsening symptoms whatsoever.

## 2016-10-07 NOTE — Assessment & Plan Note (Signed)
Patient received cycle 3 of her carboplatin/Alimta/Avastin.  Chemotherapy therapy regimen on 09/21/2016.  She also has a history of [facility] radiation treatments for previously diagnosed brain metastasis as well.  Patient presented to the [facility] today complaining of recurrent right neck pain.  See further notes for details regarding neck pain treatment.  Also, patient underwent a restaging CT of the chest/abdomen/pelvis this past Monday, 10/05/2016-which revealed stable or improving disease.  Note: Patient underwent a transfusion for anemia last week.  Hemoglobin has now improved from 8.1 up to 11.2.  Reviewed all findings and results with Dr. Julien Nordmann; and he stated that he would review the scan in further detail with the patient and her family members when patient returns on 10/12/2016.

## 2016-10-07 NOTE — Assessment & Plan Note (Signed)
Patient's potassium with last lab draw on Monday, 10/05/2016 revealed potassium was down to 3.3.  And states that she will try to increase the potassium in her diet; and refuses a potassium prescription.  At this time.  We'll continue to monitor closely.

## 2016-10-07 NOTE — Progress Notes (Signed)
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 cervical spine with palpation; but does have some tenderness with palpation to the right posterior shoulder region.  She has full range of motion with her right arm and shoulder.  Patient does report that the discomfort to her right neck and shoulder area as slowly been improving since yesterday.  She has only taken Tylenol for the discomfort.  Note: Patient underwent a restaging CT of the chest/abdomen/pelvis just this past Monday, 10/05/2016 which revealed stable or improving disease; and no specific acute neck issues.  Reviewed all symptoms with Dr. Julien Nordmann; and he recommended treating the patient symptomatically.  Patient was advised that she may try only occasional ibuprofen for the inflammation; and will also prescribe Flexeril for muscle spasms.  Patient was given one half of a Percocet tablet while the cancer centers well.  Patient will be driven home by a family member today.  Patient was advised to call/return or go directly to the emergency department for any worsening symptoms whatsoever.  Hypokalemia Patient's potassium with last lab draw on Monday, 10/05/2016 revealed potassium was down to 3.3.  And states  that she will try to increase the potassium in her diet; and refuses a potassium prescription.  At this time.  We'll continue to monitor closely.  Adenocarcinoma of right lung, stage 4 ([place]) Patient received cycle 3 of her carboplatin/Alimta/Avastin.  Chemotherapy therapy regimen on 09/21/2016.  She also has a history of [facility] radiation treatments for previously diagnosed brain metastasis as well.  Patient presented to the [facility] today complaining of recurrent right neck pain.  See further notes for details regarding neck pain treatment.  Also, patient underwent a restaging CT of the chest/abdomen/pelvis this past Monday, 10/05/2016-which revealed stable or improving disease.  Note: Patient underwent a transfusion for anemia  last week.  Hemoglobin has now improved from 8.1 up to 11.2.  Reviewed all findings and results with Dr. Julien Nordmann; and he stated that he would review the scan in further detail with the patient and her family members when patient returns on 10/12/2016.   Patient stated understanding of all instructions; and was in agreement with this plan of care. The patient knows to call the clinic with any problems, questions or concerns.   Total time spent with patient was 25 minutes;  with greater than 75 percent of that time spent in face to face counseling regarding patient's symptoms,  and coordination of care and follow up.  Disclaimer:This dictation was prepared with Dragon/digital dictation along with [employer]. Any transcriptional errors that result from this process are unintentional.  Drue Second, NP 10/07/2016

## 2016-10-08 ENCOUNTER — Telehealth: Payer: Self-pay | Admitting: Nurse Practitioner

## 2016-10-08 ENCOUNTER — Encounter: Payer: Self-pay | Admitting: Pharmacist

## 2016-10-08 ENCOUNTER — Telehealth: Payer: Self-pay

## 2016-10-08 ENCOUNTER — Ambulatory Visit (HOSPITAL_COMMUNITY): Payer: Medicare Other

## 2016-10-08 ENCOUNTER — Ambulatory Visit (HOSPITAL_COMMUNITY)
Admission: RE | Admit: 2016-10-08 | Discharge: 2016-10-08 | Disposition: A | Discharge status: Home / Self Care | Payer: Medicare Other | Source: Ambulatory Visit | Attending: Nurse Practitioner | Admitting: Nurse Practitioner

## 2016-10-08 ENCOUNTER — Other Ambulatory Visit: Payer: Self-pay | Admitting: Radiation Therapy

## 2016-10-08 DIAGNOSIS — M4302 Spondylolysis, cervical region: Secondary | ICD-10-CM | POA: Insufficient documentation

## 2016-10-08 DIAGNOSIS — C801 Malignant (primary) neoplasm, unspecified: Secondary | ICD-10-CM | POA: Insufficient documentation

## 2016-10-08 DIAGNOSIS — C7931 Secondary malignant neoplasm of brain: Secondary | ICD-10-CM

## 2016-10-08 DIAGNOSIS — C7949 Secondary malignant neoplasm of other parts of nervous system: Principal | ICD-10-CM

## 2016-10-08 DIAGNOSIS — M4802 Spinal stenosis, cervical region: Secondary | ICD-10-CM | POA: Diagnosis not present

## 2016-10-08 DIAGNOSIS — G319 Degenerative disease of nervous system, unspecified: Secondary | ICD-10-CM | POA: Diagnosis not present

## 2016-10-08 MED ORDER — GADOBENATE DIMEGLUMINE 529 MG/ML IV SOLN
15.0000 mL | Freq: Once | INTRAVENOUS | Status: AC
  Administered 2016-10-08: 15 mL via INTRAVENOUS

## 2016-10-08 NOTE — Telephone Encounter (Signed)
The cancer Center triage received a call from patient's niece [name] this morning reporting that patient has continued neck discomfort that is now radiating to the shoulder area and also one of her ears.  Is also reporting that patient was increasingly confused yesterday evening/last night; but has no confusion this morning.  She denies any numbness, tingling, or weakness.  Note: When patient was seen yesterday; patient was advised to take a small amount of ibuprofen for the inflammation and to try Flexeril.  (5 mg tabs) It is quite possible that the Flexeril caused patient's confusion.  However, did review all aspects of the phone call earlier this morning with Dr. Glendell Docker he recommended communication with radiation oncology and the scheduling of a restaging brain MRI.  He also recommended adding a cervical spine MRI for further evaluation as well.  This provider then spoke to Marcine Matar radiation oncology navigator.  Manuela Schwartz will order these scans, and then communicate the scan plan with the patient and her family.  Also, this provider.  Advised patient's family that patient should go directly to the emergency department if symptoms persist or worsen.

## 2016-10-08 NOTE — Telephone Encounter (Signed)
[name] called that Molly Black saw [facility] yesterday and they gave her rx for flexeril. Pt is no better. She has a hx of trouble finding her words, but last night she was talking out of her head which is new. Today she is talking better. She is still hurting and now has some ear pain.  S/w [name] who will talk with Dr Julien Nordmann and call pt back.

## 2016-10-09 ENCOUNTER — Telehealth: Payer: Self-pay | Admitting: Nurse Practitioner

## 2016-10-09 NOTE — Telephone Encounter (Signed)
Called patient's niece back on 2 different occasions to review the MRI results.   Advised patient's niece that the brain MRI was stable and/or improved; and the cervical neck MRI only revealed degenerative disc disease issues.  No other acute findings were noted.  Reviewed all findings with Dr. Julien Nordmann as well; and he recommended that patient follow up with her primary care physician for further evaluation and possible treatment.  Patient may also need to consider follow-up with an orthopedic physician as well.  Patient was advised to go directly to the emergency room for any worsening symptoms whatsoever.

## 2016-10-12 ENCOUNTER — Ambulatory Visit (HOSPITAL_BASED_OUTPATIENT_CLINIC_OR_DEPARTMENT_OTHER): Payer: Medicare Other | Admitting: Internal Medicine

## 2016-10-12 ENCOUNTER — Telehealth: Payer: Self-pay | Admitting: Internal Medicine

## 2016-10-12 ENCOUNTER — Encounter: Payer: Self-pay | Admitting: Internal Medicine

## 2016-10-12 ENCOUNTER — Ambulatory Visit (HOSPITAL_BASED_OUTPATIENT_CLINIC_OR_DEPARTMENT_OTHER): Payer: Medicare Other

## 2016-10-12 VITALS — BP 137/83 | HR 87 | Resp 16

## 2016-10-12 VITALS — BP 160/97 | HR 95 | Temp 98.7°F | Resp 17 | Ht 62.0 in | Wt 165.4 lb

## 2016-10-12 DIAGNOSIS — I10 Essential (primary) hypertension: Secondary | ICD-10-CM

## 2016-10-12 DIAGNOSIS — C3431 Malignant neoplasm of lower lobe, right bronchus or lung: Secondary | ICD-10-CM | POA: Diagnosis not present

## 2016-10-12 DIAGNOSIS — Z5112 Encounter for antineoplastic immunotherapy: Secondary | ICD-10-CM | POA: Diagnosis not present

## 2016-10-12 DIAGNOSIS — C7931 Secondary malignant neoplasm of brain: Secondary | ICD-10-CM | POA: Diagnosis not present

## 2016-10-12 DIAGNOSIS — C3491 Malignant neoplasm of unspecified part of right bronchus or lung: Secondary | ICD-10-CM

## 2016-10-12 DIAGNOSIS — T451X5A Adverse effect of antineoplastic and immunosuppressive drugs, initial encounter: Secondary | ICD-10-CM

## 2016-10-12 DIAGNOSIS — C7972 Secondary malignant neoplasm of left adrenal gland: Secondary | ICD-10-CM

## 2016-10-12 DIAGNOSIS — Z5111 Encounter for antineoplastic chemotherapy: Secondary | ICD-10-CM

## 2016-10-12 DIAGNOSIS — C801 Malignant (primary) neoplasm, unspecified: Principal | ICD-10-CM

## 2016-10-12 DIAGNOSIS — M542 Cervicalgia: Secondary | ICD-10-CM

## 2016-10-12 DIAGNOSIS — D6481 Anemia due to antineoplastic chemotherapy: Secondary | ICD-10-CM

## 2016-10-12 LAB — CBC WITH DIFFERENTIAL/PLATELET
BASO%: 0.2 % (ref 0.0–2.0)
BASOS ABS: 0 10*3/uL (ref 0.0–0.1)
EOS%: 0 % (ref 0.0–7.0)
Eosinophils Absolute: 0 10*3/uL (ref 0.0–0.5)
HEMATOCRIT: 33.8 % — AB (ref 34.8–46.6)
HGB: 11.2 g/dL — ABNORMAL LOW (ref 11.6–15.9)
LYMPH%: 5.1 % — ABNORMAL LOW (ref 14.0–49.7)
MCH: 30.9 pg (ref 25.1–34.0)
MCHC: 33.1 g/dL (ref 31.5–36.0)
MCV: 93.1 fL (ref 79.5–101.0)
MONO#: 0.7 10*3/uL (ref 0.1–0.9)
MONO%: 13.8 % (ref 0.0–14.0)
NEUT#: 3.8 10*3/uL (ref 1.5–6.5)
NEUT%: 80.9 % — AB (ref 38.4–76.8)
PLATELETS: 196 10*3/uL (ref 145–400)
RBC: 3.63 10*6/uL — ABNORMAL LOW (ref 3.70–5.45)
RDW: 19.6 % — ABNORMAL HIGH (ref 11.2–14.5)
WBC: 4.7 10*3/uL (ref 3.9–10.3)
lymph#: 0.2 10*3/uL — ABNORMAL LOW (ref 0.9–3.3)

## 2016-10-12 LAB — COMPREHENSIVE METABOLIC PANEL
ALBUMIN: 3.3 g/dL — AB (ref 3.5–5.0)
ALK PHOS: 41 U/L (ref 40–150)
ALT: 41 U/L (ref 0–55)
AST: 30 U/L (ref 5–34)
Anion Gap: 11 mEq/L (ref 3–11)
BUN: 21.4 mg/dL (ref 7.0–26.0)
CALCIUM: 9.9 mg/dL (ref 8.4–10.4)
CO2: 25 mEq/L (ref 22–29)
CREATININE: 0.7 mg/dL (ref 0.6–1.1)
Chloride: 105 mEq/L (ref 98–109)
EGFR: 82 mL/min/{1.73_m2} — ABNORMAL LOW (ref >90)
Glucose: 146 mg/dl — ABNORMAL HIGH (ref 70–140)
Potassium: 3.8 mEq/L (ref 3.5–5.1)
Sodium: 141 mEq/L (ref 136–145)
Total Bilirubin: 1.01 mg/dL (ref 0.20–1.20)
Total Protein: 6.3 g/dL — ABNORMAL LOW (ref 6.4–8.3)

## 2016-10-12 LAB — UA PROTEIN, DIPSTICK - CHCC: PROTEIN: 30 mg/dL

## 2016-10-12 MED ORDER — PALONOSETRON HCL INJECTION 0.25 MG/5ML
INTRAVENOUS | Status: AC
  Filled 2016-10-12: qty 5

## 2016-10-12 MED ORDER — PALONOSETRON HCL INJECTION 0.25 MG/5ML
0.2500 mg | Freq: Once | INTRAVENOUS | Status: AC
  Administered 2016-10-12: 0.25 mg via INTRAVENOUS

## 2016-10-12 MED ORDER — CYANOCOBALAMIN 1000 MCG/ML IJ SOLN
INTRAMUSCULAR | Status: AC
  Filled 2016-10-12: qty 1

## 2016-10-12 MED ORDER — SODIUM CHLORIDE 0.9 % IV SOLN
Freq: Once | INTRAVENOUS | Status: AC
  Administered 2016-10-12: 15:00:00 via INTRAVENOUS
  Filled 2016-10-12: qty 5

## 2016-10-12 MED ORDER — SODIUM CHLORIDE 0.9 % IV SOLN
500.0000 mg/m2 | Freq: Once | INTRAVENOUS | Status: AC
  Administered 2016-10-12: 900 mg via INTRAVENOUS
  Filled 2016-10-12: qty 20

## 2016-10-12 MED ORDER — SODIUM CHLORIDE 0.9 % IV SOLN
421.0000 mg | Freq: Once | INTRAVENOUS | Status: AC
  Administered 2016-10-12: 420 mg via INTRAVENOUS
  Filled 2016-10-12: qty 42

## 2016-10-12 MED ORDER — CYANOCOBALAMIN 1000 MCG/ML IJ SOLN
1000.0000 ug | Freq: Once | INTRAMUSCULAR | Status: AC
  Administered 2016-10-12: 1000 ug via INTRAMUSCULAR

## 2016-10-12 MED ORDER — SODIUM CHLORIDE 0.9 % IV SOLN
15.0000 mg/kg | Freq: Once | INTRAVENOUS | Status: AC
  Administered 2016-10-12: 1125 mg via INTRAVENOUS
  Filled 2016-10-12: qty 45

## 2016-10-12 MED ORDER — SODIUM CHLORIDE 0.9% FLUSH
10.0000 mL | INTRAVENOUS | Status: DC | PRN
  Administered 2016-10-12: 10 mL
  Filled 2016-10-12: qty 10

## 2016-10-12 MED ORDER — HEPARIN SOD (PORK) LOCK FLUSH 100 UNIT/ML IV SOLN
500.0000 [IU] | Freq: Once | INTRAVENOUS | Status: AC | PRN
  Administered 2016-10-12: 500 [IU]
  Filled 2016-10-12: qty 5

## 2016-10-12 NOTE — Progress Notes (Signed)
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 diffuse disc bulge with uncovertebral spurring. Mild canal stenosis. Mild left foraminal narrowing. C6-C7: Mild diffuse disc bulge with left greater than right uncovertebral spurring. Left-sided facet arthrosis. Resultant mild left foraminal narrowing. No significant canal or right foraminal stenosis. C7-T1:  Minimal disc bulge.  No stenosis.  Mild facet hypertrophy. Visualized upper thoracic spine demonstrates mild disc bulging at T1-2 and T2-3 without significant stenosis. IMPRESSION: MRI HEAD IMPRESSION: 1. Interval response to therapy as evidenced by marked interval improvement in previously seen 4 enhancing right frontal lobe lesions, now demonstrating minimal nodular enhancement measuring up to 5 mm as above. Associated vasogenic edema has essentially resolved. 2. No other acute intracranial process identified. 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 MRI CERVICAL SPINE IMPRESSION: 1. No acute abnormality within the cervical spine. No evidence for metastatic disease. 2. Moderate multilevel degenerative spondylolysis as above, most notable at C3-4. Mild canal stenosis at C5-6. No other significant canal narrowing. Mild to moderate multilevel foraminal narrowing as above. These  results will be called to the ordering clinician or representative by the Radiologist Assistant, and communication documented in the PACS or zVision Dashboard. Electronically Signed   By: Jeannine Boga M.D.   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 Additional history of right breast cancer. EXAM: CT CHEST, ABDOMEN, AND PELVIS WITH CONTRAST TECHNIQUE: Multidetector CT imaging of the chest, abdomen and pelvis was performed following the standard protocol during bolus administration of intravenous contrast. CONTRAST:  15m ISOVUE-300 IOPAMIDOL (ISOVUE-300) INJECTION 61% COMPARISON:  07/29/2016 CT chest, abdomen and pelvis. FINDINGS: CT CHEST FINDINGS Cardiovascular: Normal heart size. No significant pericardial fluid/thickening. Left anterior descending and right coronary atherosclerosis. Atherosclerotic nonaneurysmal thoracic aorta. Normal caliber pulmonary arteries. No central pulmonary emboli. Left internal jugular MediPort terminates in the lower third of the superior vena cava. Mediastinum/Nodes: No discrete thyroid nodules. Unremarkable esophagus. Right axillary surgical clips are noted. Mildly enlarged 1.3 cm right lower paratracheal node (series 2/image 18), previously 1.5 cm, slightly decreased. No additional pathologically enlarged mediastinal lymph nodes. No axillary or hilar lymphadenopathy. Lungs/Pleura: No pneumothorax. Trace dependent bilateral pleural effusions appear new. There is new prominent sharply marginated patchy consolidation and ground-glass attenuation in the parahilar/medial left lower lobe. There are similar new sharply marginated regions of  patchy consolidation and ground-glass attenuation in the upper paramediastinal lungs bilaterally. There is volume loss associated with the new opacified lung regions, which are favored represent evolving postradiation change. The treated left lower lobe pulmonary nodule is encompassed by the postradiation change and not discretely visualized. Peripheral left lower lobe 1.6 cm ground-glass pulmonary nodule (series 4/ image 46) is stable. No new significant pulmonary nodules. Mild centrilobular emphysema. Musculoskeletal: No aggressive appearing focal osseous lesions. Moderate thoracic spondylosis. Status post right mastectomy. Right breast prosthesis appears intact. CT ABDOMEN PELVIS FINDINGS Hepatobiliary: Normal liver size. Stable scattered simple liver cysts throughout the liver measuring up to 3.2 cm in the anteromedial  segment left liver lobe. Stable progressively enhancing 3.4 x 2.7 cm peripheral right liver lobe mass (series 2/ image 42), stable back to 12/24/2006 MRI abdomen, compatible with a benign lesion such as an atypical hemangioma. A few additional scattered subcentimeter hypodense lesions throughout the liver are too small to characterize and not appreciably changed. No new liver lesions. Cholecystectomy. Bile ducts are stable and within expected post cholecystectomy limits with common bile duct diameter 6 mm. No radiopaque choledocholithiasis. Stable small to moderate periampullary duodenal diverticulum. Pancreas: Normal, with no mass or duct dilation. Spleen: Normal size. No mass. Adrenals/Urinary Tract: No discrete adrenal nodules. No hydronephrosis. 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 No renal mass. Normal bladder. Stomach/Bowel: Grossly normal stomach. Normal caliber small bowel with no small bowel wall thickening. Normal appendix. Moderate left colonic diverticulosis, with no large bowel wall thickening or pericolonic fat  stranding. Oral contrast progresses to the rectum. Vascular/Lymphatic: Atherosclerotic nonaneurysmal abdominal aorta. Patent portal, splenic, hepatic and renal veins. No pathologically enlarged lymph nodes in the abdomen or pelvis. Reproductive: Status post hysterectomy, with no abnormal findings at the vaginal cuff. No adnexal mass. Other: No pneumoperitoneum, ascites or focal fluid collection. Musculoskeletal: No aggressive appearing focal osseous lesions. Moderate lumbar spondylosis, most prominent at L4-5 with stable 7 mm anterolisthesis at L4-5. Stable small fat containing umbilical hernia. IMPRESSION: 1. New postradiation change in the left lower lobe and upper paramediastinal lungs, which encompasses the treated left lower lobe pulmonary nodule, which is not discretely visualized. 2. Separate left lower lobe 1.6 cm ground-glass pulmonary nodule is stable and warrants continued chest CT surveillance. 3. Continued reduction in mild right lower paratracheal mediastinal lymphadenopathy. 4. Left adrenal metastasis is no longer discretely visualized. 5. No new or progressive metastatic disease in the chest, abdomen or pelvis . 6. New trace dependent bilateral pleural effusions, probably inflammatory given the postradiation change in the lung. 7. Additional findings include aortic atherosclerosis, 2 vessel coronary atherosclerosis, mild centrilobular emphysema and left colonic diverticulosis. Electronically Signed   By: Ilona Sorrel M.D.   On: 10/05/2016 13:27    ASSESSMENT AND PLAN:  This is a very pleasant 76 years old white female with metastatic non-small cell lung cancer with brain and adrenal metastasis.  She was treated with a course of concurrent chemoradiation for locally advanced disease in the chest as well as a stereotactic radiotherapy to a solitary adrenal gland lesion. The patient then developed metastatic brain lesion and she was treated with stereotactic radiotherapy to the brain. She is  currently undergoing systemic chemotherapy with carboplatin, Alimta and Avastin status post 3 cycles. She is tolerating this treatment well with no significant adverse effects. She had repeat CT scan of the chest, abdomen and pelvis as well as MRI of the brain and cervical spine performed recently. I personally and independently reviewed the scan images and discuss the results with the patient and her niece today. Her scan showed no clear evidence for disease progression. I recommended for the patient to continue her current treatment with systemic chemotherapy was carboplatin, Alimta and Avastin. She will receive cycle #4 today. She would come back for follow-up visit in 3 weeks for evaluation before starting cycle #5. For hypertension, strongly recommended for her to continue to monitor her blood pressure closely at home and to consult with her primary care physician for a need to start antihypertensive medication. For the chemotherapy-induced anemia, we'll continue to monitor her hemoglobin closely  and consider the patient for transfusion if needed. She was advised to call immediately if she has any concerning symptoms in the interval. The patient voices understanding of current disease status and treatment options and is in agreement with the current care plan.  All questions were answered. The patient knows to call the clinic with any problems, questions or concerns. We can certainly see the patient much sooner if necessary.  Disclaimer: This note was dictated with voice recognition software. Similar sounding words can inadvertently be transcribed and may not be corrected upon review.

## 2016-10-12 NOTE — Telephone Encounter (Signed)
Appointments already scheduled per treatment plan. Patient given AVS report and calendars with future scheduled appointments.

## 2016-10-12 NOTE — Patient Instructions (Signed)
[facility] Discharge Instructions for Patients Receiving Chemotherapy  Today you received the following chemotherapy agents:  Avastin, Alimta, and Carboplatin.  To help prevent nausea and vomiting after your treatment, we encourage you to take your nausea medication as directed.   If you develop nausea and vomiting that is not controlled by your nausea medication, call the clinic.   BELOW ARE SYMPTOMS THAT SHOULD BE REPORTED IMMEDIATELY:  *FEVER GREATER THAN 100.5 F  *CHILLS WITH OR WITHOUT FEVER  NAUSEA AND VOMITING THAT IS NOT CONTROLLED WITH YOUR NAUSEA MEDICATION  *UNUSUAL SHORTNESS OF BREATH  *UNUSUAL BRUISING OR BLEEDING  TENDERNESS IN MOUTH AND THROAT WITH OR WITHOUT PRESENCE OF ULCERS  *URINARY PROBLEMS  *BOWEL PROBLEMS  UNUSUAL RASH Items with * indicate a potential emergency and should be followed up as soon as possible.  Feel free to call the clinic you have any questions or concerns. The clinic phone number is [phone].  Please show the CHEMO ALERT CARD at check-in to the Emergency Department and triage nurse. 

## 2016-10-15 ENCOUNTER — Telehealth: Payer: Self-pay | Admitting: Radiation Oncology

## 2016-10-15 ENCOUNTER — Other Ambulatory Visit: Payer: Self-pay | Admitting: Radiation Oncology

## 2016-10-15 DIAGNOSIS — Z171 Estrogen receptor negative status [ER-]: Secondary | ICD-10-CM

## 2016-10-15 DIAGNOSIS — C7931 Secondary malignant neoplasm of brain: Secondary | ICD-10-CM

## 2016-10-15 DIAGNOSIS — R4182 Altered mental status, unspecified: Secondary | ICD-10-CM

## 2016-10-15 DIAGNOSIS — C50911 Malignant neoplasm of unspecified site of right female breast: Secondary | ICD-10-CM

## 2016-10-15 DIAGNOSIS — C801 Malignant (primary) neoplasm, unspecified: Secondary | ICD-10-CM

## 2016-10-15 DIAGNOSIS — C7972 Secondary malignant neoplasm of left adrenal gland: Secondary | ICD-10-CM

## 2016-10-15 DIAGNOSIS — C3491 Malignant neoplasm of unspecified part of right bronchus or lung: Secondary | ICD-10-CM

## 2016-10-15 NOTE — Telephone Encounter (Signed)
Phoned [name] back. Explained that per Dr. Tammi Klippel her aunt needed to be seen in our clinic. Provided her with an appointment for Tuesday, March 27 at 1:30 pm with Shona Simpson, PA-C. Also, requested the patient present on Friday, March 23 at 2 pm to provide a sample for urinalysis and culture. [name] verbalized understanding of all discussed and confirmed they would be present for all appointments.

## 2016-10-15 NOTE — Telephone Encounter (Signed)
Needs to be seen in clinic.  Altered mental status in a 76 yo woman may be unrelated to the brain mets.  Could be UTI or other issue.

## 2016-10-15 NOTE — Telephone Encounter (Signed)
Received message from patient's niece, [name], requesting a return call. Phoned [name] back. [name] states, "I feel like I am crying out for help and not getting it so I am calling you." She reports the patient within the last three weeks has began to have episodes of confusion, difficulty word finding, an unsteady gait, headaches and neck pain. She explains an MRI was done last week that showed "everything was normal." She confirms the patient only takes decadron around her chemo schedule. She questions if this is a side effect of radiation therapy or "something more serious." [name] understands this RN will inform Dr. Tammi Klippel of these findings and phone her back with further directions.

## 2016-10-15 NOTE — Telephone Encounter (Signed)
Open in error

## 2016-10-16 ENCOUNTER — Ambulatory Visit: Payer: Medicare Other

## 2016-10-16 ENCOUNTER — Ambulatory Visit
Admission: RE | Admit: 2016-10-16 | Discharge: 2016-10-16 | Disposition: A | Discharge status: Home / Self Care | Payer: Medicare Other | Source: Ambulatory Visit | Attending: Radiation Oncology | Admitting: Radiation Oncology

## 2016-10-16 DIAGNOSIS — C3491 Malignant neoplasm of unspecified part of right bronchus or lung: Secondary | ICD-10-CM

## 2016-10-16 DIAGNOSIS — R4182 Altered mental status, unspecified: Secondary | ICD-10-CM

## 2016-10-16 DIAGNOSIS — C7931 Secondary malignant neoplasm of brain: Secondary | ICD-10-CM

## 2016-10-16 DIAGNOSIS — Z171 Estrogen receptor negative status [ER-]: Secondary | ICD-10-CM | POA: Diagnosis not present

## 2016-10-16 DIAGNOSIS — C801 Malignant (primary) neoplasm, unspecified: Secondary | ICD-10-CM

## 2016-10-16 DIAGNOSIS — C50911 Malignant neoplasm of unspecified site of right female breast: Secondary | ICD-10-CM | POA: Diagnosis not present

## 2016-10-16 DIAGNOSIS — C3481 Malignant neoplasm of overlapping sites of right bronchus and lung: Secondary | ICD-10-CM | POA: Insufficient documentation

## 2016-10-16 DIAGNOSIS — C7972 Secondary malignant neoplasm of left adrenal gland: Secondary | ICD-10-CM | POA: Insufficient documentation

## 2016-10-16 LAB — URINALYSIS, MICROSCOPIC - CHCC
Bilirubin (Urine): NEGATIVE
GLUCOSE UR CHCC: NEGATIVE mg/dL
KETONES: NEGATIVE mg/dL
Leukocyte Esterase: NEGATIVE
Nitrite: POSITIVE
PH: 6 (ref 4.6–8.0)
SPECIFIC GRAVITY, URINE: 1.01 (ref 1.003–1.035)
Urobilinogen, UR: 0.2 mg/dL (ref 0.2–1)

## 2016-10-18 ENCOUNTER — Other Ambulatory Visit: Payer: Self-pay | Admitting: Radiation Oncology

## 2016-10-18 DIAGNOSIS — N3000 Acute cystitis without hematuria: Secondary | ICD-10-CM

## 2016-10-18 LAB — URINE CULTURE

## 2016-10-18 MED ORDER — CIPROFLOXACIN HCL 500 MG PO TABS: 500.0000 mg | ORAL_TABLET | Freq: Two times a day (BID) | ORAL | 0 refills | Status: DC

## 2016-10-19 ENCOUNTER — Other Ambulatory Visit (HOSPITAL_BASED_OUTPATIENT_CLINIC_OR_DEPARTMENT_OTHER): Payer: Medicare Other

## 2016-10-19 ENCOUNTER — Telehealth: Payer: Self-pay | Admitting: Radiation Oncology

## 2016-10-19 DIAGNOSIS — C7972 Secondary malignant neoplasm of left adrenal gland: Secondary | ICD-10-CM

## 2016-10-19 DIAGNOSIS — C801 Malignant (primary) neoplasm, unspecified: Principal | ICD-10-CM

## 2016-10-19 DIAGNOSIS — C3431 Malignant neoplasm of lower lobe, right bronchus or lung: Secondary | ICD-10-CM

## 2016-10-19 LAB — COMPREHENSIVE METABOLIC PANEL
ALT: 60 U/L — ABNORMAL HIGH (ref 0–55)
ANION GAP: 10 meq/L (ref 3–11)
AST: 59 U/L — ABNORMAL HIGH (ref 5–34)
Albumin: 3.3 g/dL — ABNORMAL LOW (ref 3.5–5.0)
Alkaline Phosphatase: 37 U/L — ABNORMAL LOW (ref 40–150)
BUN: 15.6 mg/dL (ref 7.0–26.0)
CALCIUM: 9.7 mg/dL (ref 8.4–10.4)
CHLORIDE: 104 meq/L (ref 98–109)
CO2: 24 mEq/L (ref 22–29)
CREATININE: 0.6 mg/dL (ref 0.6–1.1)
EGFR: >90 mL/min/{1.73_m2} (ref >90)
Glucose: 80 mg/dl (ref 70–140)
POTASSIUM: 4.2 meq/L (ref 3.5–5.1)
Sodium: 139 mEq/L (ref 136–145)
Total Bilirubin: 0.69 mg/dL (ref 0.20–1.20)
Total Protein: 6 g/dL — ABNORMAL LOW (ref 6.4–8.3)

## 2016-10-19 LAB — CBC WITH DIFFERENTIAL/PLATELET
BASO%: 1.4 % (ref 0.0–2.0)
BASOS ABS: 0 10*3/uL (ref 0.0–0.1)
EOS%: 4.8 % (ref 0.0–7.0)
Eosinophils Absolute: 0.1 10*3/uL (ref 0.0–0.5)
HEMATOCRIT: 30 % — AB (ref 34.8–46.6)
HGB: 10.2 g/dL — ABNORMAL LOW (ref 11.6–15.9)
LYMPH%: 12.5 % — ABNORMAL LOW (ref 14.0–49.7)
MCH: 30.5 pg (ref 25.1–34.0)
MCHC: 34 g/dL (ref 31.5–36.0)
MCV: 89.7 fL (ref 79.5–101.0)
MONO#: 0.4 10*3/uL (ref 0.1–0.9)
MONO%: 18.1 % — ABNORMAL HIGH (ref 0.0–14.0)
NEUT#: 1.3 10*3/uL — ABNORMAL LOW (ref 1.5–6.5)
NEUT%: 63.2 % (ref 38.4–76.8)
PLATELETS: 123 10*3/uL — AB (ref 145–400)
RBC: 3.35 10*6/uL — ABNORMAL LOW (ref 3.70–5.45)
RDW: 20.1 % — ABNORMAL HIGH (ref 11.2–14.5)
WBC: 2.1 10*3/uL — ABNORMAL LOW (ref 3.9–10.3)
lymph#: 0.3 10*3/uL — ABNORMAL LOW (ref 0.9–3.3)

## 2016-10-19 LAB — UA PROTEIN, DIPSTICK - CHCC

## 2016-10-19 MED ORDER — SULFAMETHOXAZOLE-TRIMETHOPRIM 800-160 MG PO TABS: 1.0000 | ORAL_TABLET | Freq: Two times a day (BID) | ORAL | 0 refills | Status: AC

## 2016-10-19 NOTE — Telephone Encounter (Signed)
Left message with [facility] requesting return call to review patient's test results. Also, explained in the message that antibiotics for the patient would be called to [facility]. Phoned [facility] and provided [facility] with a script for the patient to have Bactrim per Shona Simpson order.

## 2016-10-19 NOTE — Progress Notes (Addendum)
Molly Black. [place] 76 y.o. woman with 4 right frontal brain metastases from non-small cell cancer of the left lower lung radiation completed 07-31-16  FU for altered mental status.   Headache-frequency,location: Yes, blood pressure has been elevated.  Going to see her PCP Dr. [name] per Dr. Julien Nordmann. Fatigue: Having mild fatigue at times. Hair loss:None Skin irritation of forehead,scalp and ears:(Erythema/Hyperpigmentation) Hyperpigmentation spots sides of her face. Auditory changes(manifest post radiation):Had some ear and neck pain in the past few weeks. Nausea/vomiting Ataxia(unsteady gait): No nausea,sometimes her balance has been off per her daughter. Vision (Blurred/double vision,blind spots, and peripheral vsion changes): Reports having spots or she stated "seeing some one". Fine motor movement-picking up objects with fingers,holding objects, writing, weakness of lower extremities: None Cognitive changes:Daughter reports her mother having difficulty with word finding  and short term memory and long term memory problems that come and go for short periods some days. Aphasia/Slurred speech: None Respiratory complaints:Coughing mucus from her nose blood "states it like particles and her nose is dry". Taking Decradon 4 mg BID with chemotherapy q 3 weeks no evidence of thrush Weight: Wt Readings from Last 3 Encounters:  10/20/16 165 lb 9.6 oz (75.1 kg)  10/12/16 165 lb 6.4 oz (75 kg)  10/07/16 169 lb 8 oz (76.9 kg)   10-12-16 Saw Dr Julien Nordmann Systemic chemotherapy with carboplatin for AUC of 5, Alimta 500 MG/M2 and Avastin 15 MG/KG every 3 weeks. First dose 08/10/2016. Status post 3 cycles. Last chemotherapy 10-12-16  Being treated for a UTI  Cipro 500 mg BID,Bactrim 800-160 BID  BP (!) 157/96   Pulse 90   Temp 97.7 F (36.5 C) (Oral)   Resp 18   Ht '5\' 2"'$  (1.575 m)   Wt 165 lb 9.6 oz (75.1 kg)   SpO2 100%   BMI 30.29 kg/m

## 2016-10-19 NOTE — Telephone Encounter (Signed)
-----   Message from Hayden Pedro, PA-C sent at 10/19/2016  8:05 AM EDT ----- Bactrim DS #10, one po bid for 5 days no refills if she wasn't given anything.  ----- Message ----- From: Interface, Lab In Three Zero One Sent: 10/16/2016   4:11 PM To: Tyler Pita, MD

## 2016-10-20 ENCOUNTER — Encounter: Payer: Self-pay | Admitting: Radiation Oncology

## 2016-10-20 ENCOUNTER — Ambulatory Visit
Admission: RE | Admit: 2016-10-20 | Discharge: 2016-10-20 | Disposition: A | Discharge status: Home / Self Care | Payer: Medicare Other | Source: Ambulatory Visit | Attending: Radiation Oncology | Admitting: Radiation Oncology

## 2016-10-20 VITALS — BP 157/96 | HR 90 | Temp 97.7°F | Resp 18 | Ht 62.0 in | Wt 165.6 lb

## 2016-10-20 DIAGNOSIS — N390 Urinary tract infection, site not specified: Secondary | ICD-10-CM | POA: Insufficient documentation

## 2016-10-20 DIAGNOSIS — R918 Other nonspecific abnormal finding of lung field: Secondary | ICD-10-CM | POA: Diagnosis not present

## 2016-10-20 DIAGNOSIS — M4316 Spondylolisthesis, lumbar region: Secondary | ICD-10-CM | POA: Insufficient documentation

## 2016-10-20 DIAGNOSIS — I10 Essential (primary) hypertension: Secondary | ICD-10-CM | POA: Diagnosis not present

## 2016-10-20 DIAGNOSIS — C7931 Secondary malignant neoplasm of brain: Secondary | ICD-10-CM | POA: Diagnosis present

## 2016-10-20 DIAGNOSIS — R410 Disorientation, unspecified: Secondary | ICD-10-CM | POA: Diagnosis not present

## 2016-10-20 DIAGNOSIS — C3491 Malignant neoplasm of unspecified part of right bronchus or lung: Secondary | ICD-10-CM

## 2016-10-20 DIAGNOSIS — M47816 Spondylosis without myelopathy or radiculopathy, lumbar region: Secondary | ICD-10-CM | POA: Insufficient documentation

## 2016-10-20 DIAGNOSIS — C7972 Secondary malignant neoplasm of left adrenal gland: Secondary | ICD-10-CM | POA: Diagnosis present

## 2016-10-20 DIAGNOSIS — K429 Umbilical hernia without obstruction or gangrene: Secondary | ICD-10-CM | POA: Diagnosis not present

## 2016-10-20 NOTE — Telephone Encounter (Signed)
Have yet to hear back from patient or [name]. Phoned again to ensure antibiotics have been picked up and started. No answer.  Left message on Sheila's voicemail requesting return call.

## 2016-10-20 NOTE — Progress Notes (Signed)
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 PTV 1 Right frontal 16 mm was treated to 20 Gy in 1 fraction. 2. PTV 2 & 3 Right frontal 16 mm was treated to 20 Gy in 1 fraction. 3. PTV 4 Right frontal 8 mm was treated to 20 Gy in 1 fraction.   05/25/16 - 07/07/16 : 1. The right lung target was treated to 60 Gy in 30 fractions of 2 Gy              2. SBRT Right Adrenal : 50 Gy in 5 fractions of 10 Gy  Narrative:  The patient returns today for a work in appointment due to concerns of mental status changes. In summary she was diagnosed with what was thought to be stage III disease and received concurrent chemotherapy and radiotherapy. During the course of her treatment, it was discovered by PET scan that she in fact had Stage IV disease with right adrenal metastases. She completed definitive lung treatment and SBRT to the right adrenal gland as this was felt to be oliogometastatic disease. She presented with stroke like symptoms in December 2017 and was admitted to [facility]. After an MRA was performed, it was identified that rather than a CVA, she had several metastatic deposits in the brain, and she subsequently completed SRS. She has tapered off dexamethasone at this time. She reports that she had a headache, neck pain, and word finding difficulty and was evaluated about a week ago in the symptom management clinic and underwent an MRI of the brain and cervical spine. Neither revealed suspicion for progressive or new disease. Arthritic changes were noted of her cervical spine, and no new disease or edema was noted of her brain. She  comes today after having undergone a urinalysis with culture revealing greater than 100,000 colonies of e. Coli. She was started on Bactrim DS. She was also called in Cipro and has taken a dose of each medication.   On review of systems, the patient reports she's been feeling much better in the last 2 days. She describes episodes of hypertension and continues on Avastin, she meets this afternoon with her PCP regarding management of her hypertension. She denies any current headaches, neck pain, or difficulty with word finding, changes in visual or auditory acuity. She denies any chest pain, shortness of breath, fevers or chills. No other complaints are verbalized.  ALLERGIES:  has No Known Allergies.  Meds: Current Outpatient Prescriptions  Medication Sig Dispense Refill  . aspirin 81 MG tablet Take 81 mg by mouth daily.    . ciprofloxacin (CIPRO) 500 MG tablet Take 1 tablet (500 mg total) by mouth 2 (two) times daily. 14 tablet 0  . dexamethasone (DECADRON) 4 MG tablet 4 mg by mouth twice a day the day before, day of and day after the chemotherapy every 3 weeks. (Patient taking differently: 2 (two) times daily. 4 mg by mouth twice a day the day before, day of and day after the chemotherapy every 3 weeks.) 40 tablet 1  . feeding supplement (ENSURE CLINICAL STRENGTH) LIQD Take 237 mLs by  mouth AC breakfast. Takes occasionally    . folic acid (FOLVITE) 1 MG tablet Take 1 tablet (1 mg total) by mouth daily. 30 tablet 4  . Lacosamide (VIMPAT) 150 MG TABS Take 1 tablet (150 mg total) by mouth 2 (two) times daily. 60 tablet 11  . lidocaine-prilocaine (EMLA) cream Apply 1 application topically as needed. Apply 1-2  tsp over port site 1.5 -2 hours prior to chemotherapy. 30 g 0  . omeprazole (PRILOSEC) 40 MG capsule Take 1 capsule (40 mg total) by mouth 2 (two) times daily. 30 minutes before breakfast and 30 minutes before dinner 180 capsule 1  . polyethylene glycol (MIRALAX / GLYCOLAX) packet Take 17 g by  mouth daily.    . Probiotic Product (SUPER PROBIOTIC PO) Take 1 tablet by mouth daily.    . prochlorperazine (COMPAZINE) 10 MG tablet Take 10 mg by mouth every 6 (six) hours as needed.    . sucralfate (CARAFATE) 1 g tablet Take 1 tablet (1 g total) by mouth 4 (four) times daily. 120 tablet 3  . sulfamethoxazole-trimethoprim (BACTRIM DS,SEPTRA DS) 800-160 MG tablet Take 1 tablet by mouth 2 (two) times daily. 10 tablet 0  . beta carotene w/minerals (OCUVITE) tablet Take 1 tablet by mouth daily.    . Calcium Carbonate-Vitamin D (CALCIUM 600+D PO) Take by mouth 2 (two) times daily.    . Cholecalciferol (VITAMIN D3) 3000 UNITS TABS Take 1 tablet by mouth daily.    . Cyanocobalamin 2500 MCG TABS Take 1 tablet by mouth daily.    . cyclobenzaprine (FLEXERIL) 5 MG tablet Take 1 tablet (5 mg total) by mouth 3 (three) times daily as needed for muscle spasms. (Patient not taking: Reported on 10/20/2016) 30 tablet 0  . Diphenhyd-Hydrocort-Nystatin (FIRST-DUKES MOUTHWASH) SUSP Swish and swallow 15 mLs 4 (four) times daily. (Patient not taking: Reported on 10/07/2016) 237 mL 1  . Multiple Vitamin (MULTIVITAMIN WITH MINERALS) TABS tablet Take 1 tablet by mouth daily.    . Omega-3 Fatty Acids (FISH OIL) 1000 MG CAPS Take 2,000 mg by mouth daily.     No current facility-administered medications for this encounter.     Physical Findings:  height is '5\' 2"'$  (1.575 m) and weight is 165 lb 9.6 oz (75.1 kg). Her oral temperature is 97.7 F (36.5 C). Her blood pressure is 157/96 (abnormal) and her pulse is 90. Her respiration is 18 and oxygen saturation is 100%.  In general this is a well appearing caucasian female in no acute distress. She's alert and oriented x4 and appropriate throughout the examination. Cardiopulmonary assessment is negative for acute distress and she exhibits normal effort. Cardiovascular exam reveals a regular rate and rhythm, no clicks, rubs, or murmurs are noted. Chest is clear to auscultation  bilaterally. HEENT reveals that she is normocephalic, atraumatic. EOMs are intact. PERRLA is noted bilaterally. Cranial nerves II-XII are examined and are intact. She has intact perception to soft touch of her face, arms, and legs. She has 5/5 grip strength and strength of bilateral upper and lower extremities. Cerebellar function is preserved and finger to nose testing is negative for focal deficits.  Lab Findings: Lab Results  Component Value Date   WBC 2.1 (L) 10/19/2016   HGB 10.2 (L) 10/19/2016   HCT 30.0 (L) 10/19/2016   MCV 89.7 10/19/2016   PLT 123 (L) 10/19/2016     Radiographic Findings: Ct Chest W Contrast  Result Date: 10/05/2016 CLINICAL DATA:  Stage IV left lower lobe lung adenocarcinoma with left adrenal  and brain metastases diagnosed October 2017 status post concurrent chemoradiation therapy, stereotactic left adrenal radiotherapy and stereotactic brain therapy, with ongoing systemic chemotherapy, presenting for restaging. Additional history of right breast cancer. EXAM: CT CHEST, ABDOMEN, AND PELVIS WITH CONTRAST TECHNIQUE: Multidetector CT imaging of the chest, abdomen and pelvis was performed following the standard protocol during bolus administration of intravenous contrast. CONTRAST:  154m ISOVUE-300 IOPAMIDOL (ISOVUE-300) INJECTION 61% COMPARISON:  07/29/2016 CT chest, abdomen and pelvis. FINDINGS: CT CHEST FINDINGS Cardiovascular: Normal heart size. No significant pericardial fluid/thickening. Left anterior descending and right coronary atherosclerosis. Atherosclerotic nonaneurysmal thoracic aorta. Normal caliber pulmonary arteries. No central pulmonary emboli. Left internal jugular MediPort terminates in the lower third of the superior vena cava. Mediastinum/Nodes: No discrete thyroid nodules. Unremarkable esophagus. Right axillary surgical clips are noted. Mildly enlarged 1.3 cm right lower paratracheal node (series 2/image 18), previously 1.5 cm, slightly decreased. No  additional pathologically enlarged mediastinal lymph nodes. No axillary or hilar lymphadenopathy. Lungs/Pleura: No pneumothorax. Trace dependent bilateral pleural effusions appear new. There is new prominent sharply marginated patchy consolidation and ground-glass attenuation in the parahilar/medial left lower lobe. There are similar new sharply marginated regions of patchy consolidation and ground-glass attenuation in the upper paramediastinal lungs bilaterally. There is volume loss associated with the new opacified lung regions, which are favored represent evolving postradiation change. The treated left lower lobe pulmonary nodule is encompassed by the postradiation change and not discretely visualized. Peripheral left lower lobe 1.6 cm ground-glass pulmonary nodule (series 4/ image 46) is stable. No new significant pulmonary nodules. Mild centrilobular emphysema. Musculoskeletal: No aggressive appearing focal osseous lesions. Moderate thoracic spondylosis. Status post right mastectomy. Right breast prosthesis appears intact. CT ABDOMEN PELVIS FINDINGS Hepatobiliary: Normal liver size. Stable scattered simple liver cysts throughout the liver measuring up to 3.2 cm in the anteromedial segment left liver lobe. Stable progressively enhancing 3.4 x 2.7 cm peripheral right liver lobe mass (series 2/ image 42), stable back to 12/24/2006 MRI abdomen, compatible with a benign lesion such as an atypical hemangioma. A few additional scattered subcentimeter hypodense lesions throughout the liver are too small to characterize and not appreciably changed. No new liver lesions. Cholecystectomy. Bile ducts are stable and within expected post cholecystectomy limits with common bile duct diameter 6 mm. No radiopaque choledocholithiasis. Stable small to moderate periampullary duodenal diverticulum. Pancreas: Normal, with no mass or duct dilation. Spleen: Normal size. No mass. Adrenals/Urinary Tract: No discrete adrenal nodules. No  hydronephrosis. 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 No renal mass. Normal bladder. Stomach/Bowel: Grossly normal stomach. Normal caliber small bowel with no small bowel wall thickening. Normal appendix. Moderate left colonic diverticulosis, with no large bowel wall thickening or pericolonic fat stranding. Oral contrast progresses to the rectum. Vascular/Lymphatic: Atherosclerotic nonaneurysmal abdominal aorta. Patent portal, splenic, hepatic and renal veins. No pathologically enlarged lymph nodes in the abdomen or pelvis. Reproductive: Status post hysterectomy, with no abnormal findings at the vaginal cuff. No adnexal mass. Other: No pneumoperitoneum, ascites or focal fluid collection. Musculoskeletal: No aggressive appearing focal osseous lesions. Moderate lumbar spondylosis, most prominent at L4-5 with stable 7 mm anterolisthesis at L4-5. Stable small fat containing umbilical hernia. IMPRESSION: 1. New postradiation change in the left lower lobe and upper paramediastinal lungs, which encompasses the treated left lower lobe pulmonary nodule, which is not discretely visualized. 2. Separate left lower lobe 1.6 cm ground-glass pulmonary nodule is stable and warrants continued chest CT surveillance. 3. Continued  reduction in mild right lower paratracheal mediastinal lymphadenopathy. 4. Left adrenal metastasis is no longer discretely visualized. 5. No new or progressive metastatic disease in the chest, abdomen or pelvis . 6. New trace dependent bilateral pleural effusions, probably inflammatory given the postradiation change in the lung. 7. Additional findings include aortic atherosclerosis, 2 vessel coronary atherosclerosis, mild centrilobular emphysema and left colonic diverticulosis. Electronically Signed   By: Ilona Sorrel M.D.   On: 10/05/2016 13:27   Mr Jeri Cos CZ Contrast  Result Date: 10/08/2016 CLINICAL DATA:  SRS protocol for restaging.  Patient with acute confusion. Severe neck pain. EXAM: MRI HEAD WITHOUT AND WITH CONTRAST MRI CERVICAL SPINE WITHOUT AND WITH CONTRAST TECHNIQUE: Multiplanar, multiecho pulse sequences of the brain and surrounding structures, and cervical spine, to include the craniocervical junction and cervicothoracic junction, were obtained without and with intravenous contrast. CONTRAST:  49m MULTIHANCE GADOBENATE DIMEGLUMINE 529 MG/ML IV SOLN COMPARISON:  Prior brain MRI from 07/24/2016. FINDINGS: MRI HEAD FINDINGS Brain:  Study moderately degraded by motion artifact. Previously seen metastatic lesions clustered within the right frontal lobe are markedly improved relative to prior. There is residual 4 mm nodular focus of enhancement at site of previously seen largest lesion (series 18, image 122, previously 15 x 10 mm). Faint 3 mm nodular enhancement at size of additional right frontal lobe lesion (series 18, image 123). Additional faint 2-3 mm foci of enhancement at sites at of additional right frontal lobe lesions (series 18, image 133, 135). Associated vasogenic edema is markedly improved/resolved. No new lesions. The no evidence for acute infarct. Gray-white matter differentiation maintained. Small amount of susceptibility artifact associated with the right frontal lobe metastatic lesions. No other evidence for acute or chronic intracranial hemorrhage. Scattered periventricular T2/FLAIR hyperintensity within the periventricular and deep white matter both cerebral hemispheres, similar to previous, and most like related chronic small vessel ischemic disease. No other mass lesion. No mass effect or midline shift. Ventricles normal in size without evidence for hydrocephalus. No extra-axial fluid collection. Pituitary gland and suprasellar region within normal limits. Vascular: Major intracranial vascular flow voids are maintained. Skull and upper cervical spine: Craniocervical junction within normal limits. Bone marrow signal  intensity within normal limits. No scalp soft tissue abnormality. Sinuses/Orbits: Globes normal soft tissues within normal limits. Patient is status post lens extraction bilaterally. Scattered mucosal thickening within the ethmoidal air cells and maxillary sinuses. Superimposed retention cyst present within the maxillary sinuses. Paranasal sinuses are otherwise clear. No mastoid effusion. Inner ear structures within normal limits. Other: None. MRI CERVICAL SPINE FINDINGS Alignment: Study mildly degraded by motion artifact. Trace anterolisthesis of C6 on C7 and C7 on T1, likely chronic. Vertebral bodies otherwise normally aligned with preservation of the normal cervical lordosis. Vertebrae: Vertebral body heights are maintained. No evidence for acute or chronic fracture. Signal intensity within the vertebral body bone marrow within normal limits. No discrete osseous lesions. No abnormal enhancement. Cord: Signal intensity within the cervical spinal cord is normal. Posterior Fossa, vertebral arteries, paraspinal tissues: Paraspinous and prevertebral soft tissues within normal limits. No prevertebral edema. Normal intravascular flow voids present within the vertebral arteries bilaterally. No abnormal enhancement. Disc levels: C2-C3: Negative. C3-C4: Circumferential disc osteophyte complex with bilateral facet arthrosis. Broad posterior disc osteophyte flattens and partially indents the ventral thecal sac with resultant mild canal stenosis. Moderate left with mild right foraminal stenosis. C4-C5: Mild diffuse disc bulge with bilateral uncovertebral spurring. Bulging disc mildly indents the ventral thecal sac without significant canal stenosis. No significant foraminal  encroachment. C5-C6: Mild diffuse disc bulge with uncovertebral spurring. Mild canal stenosis. Mild left foraminal narrowing. C6-C7: Mild diffuse disc bulge with left greater than right uncovertebral spurring. Left-sided facet arthrosis. Resultant mild  left foraminal narrowing. No significant canal or right foraminal stenosis. C7-T1:  Minimal disc bulge.  No stenosis.  Mild facet hypertrophy. Visualized upper thoracic spine demonstrates mild disc bulging at T1-2 and T2-3 without significant stenosis. IMPRESSION: MRI HEAD IMPRESSION: 1. Interval response to therapy as evidenced by marked interval improvement in previously seen 4 enhancing right frontal lobe lesions, now demonstrating minimal nodular enhancement measuring up to 5 mm as above. Associated vasogenic edema has essentially resolved. 2. No other acute intracranial process identified. 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 MRI CERVICAL SPINE IMPRESSION: 1. No acute abnormality within the cervical spine. No evidence for metastatic disease. 2. Moderate multilevel degenerative spondylolysis as above, most notable at C3-4. Mild canal stenosis at C5-6. No other significant canal narrowing. Mild to moderate multilevel foraminal narrowing as above. These results will be called to the ordering clinician or representative by the Radiologist Assistant, and communication documented in the PACS or zVision Dashboard. Electronically Signed   By: Jeannine Boga M.D.   On: 10/08/2016 17:37   Mr Cervical Spine W Wo Contrast  Result Date: 10/08/2016 CLINICAL DATA:  SRS protocol for restaging. Patient with acute confusion. Severe neck pain. EXAM: MRI HEAD WITHOUT AND WITH CONTRAST MRI CERVICAL SPINE WITHOUT AND WITH CONTRAST TECHNIQUE: Multiplanar, multiecho pulse sequences of the brain and surrounding structures, and cervical spine, to include the craniocervical junction and cervicothoracic junction, were obtained without and with intravenous contrast. CONTRAST:  42m MULTIHANCE GADOBENATE DIMEGLUMINE 529 MG/ML IV SOLN COMPARISON:  Prior brain MRI from 07/24/2016. FINDINGS: MRI HEAD FINDINGS Brain:  Study moderately degraded by motion artifact. Previously seen metastatic lesions clustered  within the right frontal lobe are markedly improved relative to prior. There is residual 4 mm nodular focus of enhancement at site of previously seen largest lesion (series 18, image 122, previously 15 x 10 mm). Faint 3 mm nodular enhancement at size of additional right frontal lobe lesion (series 18, image 123). Additional faint 2-3 mm foci of enhancement at sites at of additional right frontal lobe lesions (series 18, image 133, 135). Associated vasogenic edema is markedly improved/resolved. No new lesions. The no evidence for acute infarct. Gray-white matter differentiation maintained. Small amount of susceptibility artifact associated with the right frontal lobe metastatic lesions. No other evidence for acute or chronic intracranial hemorrhage. Scattered periventricular T2/FLAIR hyperintensity within the periventricular and deep white matter both cerebral hemispheres, similar to previous, and most like related chronic small vessel ischemic disease. No other mass lesion. No mass effect or midline shift. Ventricles normal in size without evidence for hydrocephalus. No extra-axial fluid collection. Pituitary gland and suprasellar region within normal limits. Vascular: Major intracranial vascular flow voids are maintained. Skull and upper cervical spine: Craniocervical junction within normal limits. Bone marrow signal intensity within normal limits. No scalp soft tissue abnormality. Sinuses/Orbits: Globes normal soft tissues within normal limits. Patient is status post lens extraction bilaterally. Scattered mucosal thickening within the ethmoidal air cells and maxillary sinuses. Superimposed retention cyst present within the maxillary sinuses. Paranasal sinuses are otherwise clear. No mastoid effusion. Inner ear structures within normal limits. Other: None. MRI CERVICAL SPINE FINDINGS Alignment: Study mildly degraded by motion artifact. Trace anterolisthesis of C6 on C7 and C7 on T1, likely chronic. Vertebral bodies  otherwise normally aligned with preservation of the normal  cervical lordosis. Vertebrae: Vertebral body heights are maintained. No evidence for acute or chronic fracture. Signal intensity within the vertebral body bone marrow within normal limits. No discrete osseous lesions. No abnormal enhancement. Cord: Signal intensity within the cervical spinal cord is normal. Posterior Fossa, vertebral arteries, paraspinal tissues: Paraspinous and prevertebral soft tissues within normal limits. No prevertebral edema. Normal intravascular flow voids present within the vertebral arteries bilaterally. No abnormal enhancement. Disc levels: C2-C3: Negative. C3-C4: Circumferential disc osteophyte complex with bilateral facet arthrosis. Broad posterior disc osteophyte flattens and partially indents the ventral thecal sac with resultant mild canal stenosis. Moderate left with mild right foraminal stenosis. C4-C5: Mild diffuse disc bulge with bilateral uncovertebral spurring. Bulging disc mildly indents the ventral thecal sac without significant canal stenosis. No significant foraminal encroachment. C5-C6: Mild diffuse disc bulge with uncovertebral spurring. Mild canal stenosis. Mild left foraminal narrowing. C6-C7: Mild diffuse disc bulge with left greater than right uncovertebral spurring. Left-sided facet arthrosis. Resultant mild left foraminal narrowing. No significant canal or right foraminal stenosis. C7-T1:  Minimal disc bulge.  No stenosis.  Mild facet hypertrophy. Visualized upper thoracic spine demonstrates mild disc bulging at T1-2 and T2-3 without significant stenosis. IMPRESSION: MRI HEAD IMPRESSION: 1. Interval response to therapy as evidenced by marked interval improvement in previously seen 4 enhancing right frontal lobe lesions, now demonstrating minimal nodular enhancement measuring up to 5 mm as above. Associated vasogenic edema has essentially resolved. 2. No other acute intracranial process identified. Stable  atrophy with chronic microvascular ischemic disease. MRI CERVICAL SPINE IMPRESSION: 1. No acute abnormality within the cervical spine. No evidence for metastatic disease. 2. Moderate multilevel degenerative spondylolysis as above, most notable at C3-4. Mild canal stenosis at C5-6. No other significant canal narrowing. Mild to moderate multilevel foraminal narrowing as above. These results will be called to the ordering clinician or representative by the Radiologist Assistant, and communication documented in the PACS or zVision Dashboard. Electronically Signed   By: Jeannine Boga M.D.   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 Additional history of right breast cancer. EXAM: CT CHEST, ABDOMEN, AND PELVIS WITH CONTRAST TECHNIQUE: Multidetector CT imaging of the chest, abdomen and pelvis was performed following the standard protocol during bolus administration of intravenous contrast. CONTRAST:  175m ISOVUE-300 IOPAMIDOL (ISOVUE-300) INJECTION 61% COMPARISON:  07/29/2016 CT chest, abdomen and pelvis. FINDINGS: CT CHEST FINDINGS Cardiovascular: Normal heart size. No significant pericardial fluid/thickening. Left anterior descending and right coronary atherosclerosis. Atherosclerotic nonaneurysmal thoracic aorta. Normal caliber pulmonary arteries. No central pulmonary emboli. Left internal jugular MediPort terminates in the lower third of the superior vena cava. Mediastinum/Nodes: No discrete thyroid nodules. Unremarkable esophagus. Right axillary surgical clips are noted. Mildly enlarged 1.3 cm right lower paratracheal node (series 2/image 18), previously 1.5 cm, slightly  decreased. No additional pathologically enlarged mediastinal lymph nodes. No axillary or hilar lymphadenopathy. Lungs/Pleura: No pneumothorax. Trace dependent bilateral pleural effusions appear new. There is new prominent sharply marginated patchy consolidation and ground-glass attenuation in the parahilar/medial left lower lobe. There are similar new sharply marginated regions of patchy consolidation and ground-glass attenuation in the upper paramediastinal lungs bilaterally. There is volume loss associated with the new opacified lung regions, which are favored represent evolving postradiation change. The treated  left lower lobe pulmonary nodule is encompassed by the postradiation change and not discretely visualized. Peripheral left lower lobe 1.6 cm ground-glass pulmonary nodule (series 4/ image 46) is stable. No new significant pulmonary nodules. Mild centrilobular emphysema. Musculoskeletal: No aggressive appearing focal osseous lesions. Moderate thoracic spondylosis. Status post right mastectomy. Right breast prosthesis appears intact. CT ABDOMEN PELVIS FINDINGS Hepatobiliary: Normal liver size. Stable scattered simple liver cysts throughout the liver measuring up to 3.2 cm in the anteromedial segment left liver lobe. Stable progressively enhancing 3.4 x 2.7 cm peripheral right liver lobe mass (series 2/ image 42), stable back to 12/24/2006 MRI abdomen, compatible with a benign lesion such as an atypical hemangioma. A few additional scattered subcentimeter hypodense lesions throughout the liver are too small to characterize and not appreciably changed. No new liver lesions. Cholecystectomy. Bile ducts are stable and within expected post cholecystectomy limits with common bile duct diameter 6 mm. No radiopaque choledocholithiasis. Stable small to moderate periampullary duodenal diverticulum. Pancreas: Normal, with no mass or duct dilation. Spleen: Normal size. No mass. Adrenals/Urinary Tract: No discrete  adrenal nodules. No hydronephrosis. 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 No renal mass. Normal bladder. Stomach/Bowel: Grossly normal stomach. Normal caliber small bowel with no small bowel wall thickening. Normal appendix. Moderate left colonic diverticulosis, with no large bowel wall thickening or pericolonic fat stranding. Oral contrast progresses to the rectum. Vascular/Lymphatic: Atherosclerotic nonaneurysmal abdominal aorta. Patent portal, splenic, hepatic and renal veins. No pathologically enlarged lymph nodes in the abdomen or pelvis. Reproductive: Status post hysterectomy, with no abnormal findings at the vaginal cuff. No adnexal mass. Other: No pneumoperitoneum, ascites or focal fluid collection. Musculoskeletal: No aggressive appearing focal osseous lesions. Moderate lumbar spondylosis, most prominent at L4-5 with stable 7 mm anterolisthesis at L4-5. Stable small fat containing umbilical hernia. IMPRESSION: 1. New postradiation change in the left lower lobe and upper paramediastinal lungs, which encompasses the treated left lower lobe pulmonary nodule, which is not discretely visualized. 2. Separate left lower lobe 1.6 cm ground-glass pulmonary nodule is stable and warrants continued chest CT surveillance. 3. Continued reduction in mild right lower paratracheal mediastinal lymphadenopathy. 4. Left adrenal metastasis is no longer discretely visualized. 5. No new or progressive metastatic disease in the chest, abdomen or pelvis . 6. New trace dependent bilateral pleural effusions, probably inflammatory given the postradiation change in the lung. 7. Additional findings include aortic atherosclerosis, 2 vessel coronary atherosclerosis, mild centrilobular emphysema and left colonic diverticulosis. Electronically Signed   By: Ilona Sorrel M.D.   On: 10/05/2016 13:27    Impression/Plan: 1. 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 The patient appears clinically to be stable. She will continue with Alimta/Avastin per Dr. Julien Nordmann. We will plan a repeat MRI in 3 months time for continued surveillance of her brain disease. She is in agreement and will call sooner if she has questions or concerns. 2. Hypertension. This is likely due to Avastin. She will meet with her PCP today to discuss antihypertensives. 3. Word finding difficulty and recent confusion and UTI. It appears that her symptoms are caused by a UTI rather than brain disease progression or edema. We will plan to continue Bactrim DS, and she will complete her course of this. She will discontinue Cipro as well. If her symptoms return she will contact our office for re-evaluation.      Carola Rhine, PAC

## 2016-10-26 ENCOUNTER — Other Ambulatory Visit (HOSPITAL_BASED_OUTPATIENT_CLINIC_OR_DEPARTMENT_OTHER): Payer: Medicare Other

## 2016-10-26 DIAGNOSIS — C7972 Secondary malignant neoplasm of left adrenal gland: Secondary | ICD-10-CM

## 2016-10-26 DIAGNOSIS — C3431 Malignant neoplasm of lower lobe, right bronchus or lung: Secondary | ICD-10-CM | POA: Diagnosis not present

## 2016-10-26 DIAGNOSIS — C801 Malignant (primary) neoplasm, unspecified: Principal | ICD-10-CM

## 2016-10-26 LAB — COMPREHENSIVE METABOLIC PANEL WITH GFR
ALT: 62 U/L — ABNORMAL HIGH (ref 0–55)
AST: 49 U/L — ABNORMAL HIGH (ref 5–34)
Albumin: 3.5 g/dL (ref 3.5–5.0)
Alkaline Phosphatase: 41 U/L (ref 40–150)
Anion Gap: 10 meq/L (ref 3–11)
BUN: 15.2 mg/dL (ref 7.0–26.0)
CO2: 23 meq/L (ref 22–29)
Calcium: 10.2 mg/dL (ref 8.4–10.4)
Chloride: 106 meq/L (ref 98–109)
Creatinine: 0.7 mg/dL (ref 0.6–1.1)
EGFR: 87 ml/min/1.73 m2 — ABNORMAL LOW (ref >90)
Glucose: 85 mg/dL (ref 70–140)
Potassium: 4.3 meq/L (ref 3.5–5.1)
Sodium: 140 meq/L (ref 136–145)
Total Bilirubin: 0.71 mg/dL (ref 0.20–1.20)
Total Protein: 6.4 g/dL (ref 6.4–8.3)

## 2016-10-26 LAB — CBC WITH DIFFERENTIAL/PLATELET
BASO%: 0.6 % (ref 0.0–2.0)
Basophils Absolute: 0 10e3/uL (ref 0.0–0.1)
EOS%: 1.6 % (ref 0.0–7.0)
Eosinophils Absolute: 0.1 10e3/uL (ref 0.0–0.5)
HCT: 31.1 % — ABNORMAL LOW (ref 34.8–46.6)
HGB: 10.6 g/dL — ABNORMAL LOW (ref 11.6–15.9)
LYMPH%: 5.4 % — ABNORMAL LOW (ref 14.0–49.7)
MCH: 31.2 pg (ref 25.1–34.0)
MCHC: 34.1 g/dL (ref 31.5–36.0)
MCV: 91.6 fL (ref 79.5–101.0)
MONO#: 1 10e3/uL — ABNORMAL HIGH (ref 0.1–0.9)
MONO%: 19.8 % — ABNORMAL HIGH (ref 0.0–14.0)
NEUT#: 3.7 10e3/uL (ref 1.5–6.5)
NEUT%: 72.6 % (ref 38.4–76.8)
Platelets: 105 10e3/uL — ABNORMAL LOW (ref 145–400)
RBC: 3.39 10e6/uL — ABNORMAL LOW (ref 3.70–5.45)
RDW: 21.8 % — ABNORMAL HIGH (ref 11.2–14.5)
WBC: 5.2 10e3/uL (ref 3.9–10.3)
lymph#: 0.3 10e3/uL — ABNORMAL LOW (ref 0.9–3.3)

## 2016-10-27 ENCOUNTER — Other Ambulatory Visit: Payer: Self-pay | Admitting: Internal Medicine

## 2016-10-27 DIAGNOSIS — Z1231 Encounter for screening mammogram for malignant neoplasm of breast: Secondary | ICD-10-CM

## 2016-11-02 ENCOUNTER — Telehealth: Payer: Self-pay | Admitting: Internal Medicine

## 2016-11-02 ENCOUNTER — Ambulatory Visit (HOSPITAL_BASED_OUTPATIENT_CLINIC_OR_DEPARTMENT_OTHER): Payer: Medicare Other

## 2016-11-02 ENCOUNTER — Encounter: Payer: Self-pay | Admitting: Internal Medicine

## 2016-11-02 ENCOUNTER — Other Ambulatory Visit (HOSPITAL_BASED_OUTPATIENT_CLINIC_OR_DEPARTMENT_OTHER): Payer: Medicare Other

## 2016-11-02 ENCOUNTER — Ambulatory Visit (HOSPITAL_BASED_OUTPATIENT_CLINIC_OR_DEPARTMENT_OTHER): Payer: Medicare Other | Admitting: Internal Medicine

## 2016-11-02 VITALS — BP 162/88 | HR 85 | Resp 18 | Ht 62.0 in | Wt 166.4 lb

## 2016-11-02 VITALS — BP 137/85

## 2016-11-02 DIAGNOSIS — C7972 Secondary malignant neoplasm of left adrenal gland: Secondary | ICD-10-CM

## 2016-11-02 DIAGNOSIS — D6481 Anemia due to antineoplastic chemotherapy: Secondary | ICD-10-CM | POA: Diagnosis not present

## 2016-11-02 DIAGNOSIS — Z5111 Encounter for antineoplastic chemotherapy: Secondary | ICD-10-CM

## 2016-11-02 DIAGNOSIS — C3431 Malignant neoplasm of lower lobe, right bronchus or lung: Secondary | ICD-10-CM | POA: Diagnosis not present

## 2016-11-02 DIAGNOSIS — C7931 Secondary malignant neoplasm of brain: Secondary | ICD-10-CM | POA: Diagnosis not present

## 2016-11-02 DIAGNOSIS — Z5112 Encounter for antineoplastic immunotherapy: Secondary | ICD-10-CM

## 2016-11-02 DIAGNOSIS — I10 Essential (primary) hypertension: Secondary | ICD-10-CM

## 2016-11-02 DIAGNOSIS — C3491 Malignant neoplasm of unspecified part of right bronchus or lung: Secondary | ICD-10-CM

## 2016-11-02 DIAGNOSIS — C801 Malignant (primary) neoplasm, unspecified: Secondary | ICD-10-CM

## 2016-11-02 LAB — COMPREHENSIVE METABOLIC PANEL
ALT: 44 U/L (ref 0–55)
ANION GAP: 13 meq/L — AB (ref 3–11)
AST: 42 U/L — ABNORMAL HIGH (ref 5–34)
Albumin: 3.8 g/dL (ref 3.5–5.0)
Alkaline Phosphatase: 40 U/L (ref 40–150)
BUN: 15 mg/dL (ref 7.0–26.0)
CALCIUM: 10.4 mg/dL (ref 8.4–10.4)
CHLORIDE: 106 meq/L (ref 98–109)
CO2: 22 meq/L (ref 22–29)
Creatinine: 0.7 mg/dL (ref 0.6–1.1)
EGFR: 87 mL/min/{1.73_m2} — AB (ref >90)
Glucose: 120 mg/dl (ref 70–140)
POTASSIUM: 4.2 meq/L (ref 3.5–5.1)
Sodium: 142 mEq/L (ref 136–145)
Total Bilirubin: 0.78 mg/dL (ref 0.20–1.20)
Total Protein: 6.5 g/dL (ref 6.4–8.3)

## 2016-11-02 LAB — CBC WITH DIFFERENTIAL/PLATELET
BASO%: 0 % (ref 0.0–2.0)
BASOS ABS: 0 10*3/uL (ref 0.0–0.1)
EOS%: 0 % (ref 0.0–7.0)
Eosinophils Absolute: 0 10*3/uL (ref 0.0–0.5)
HEMATOCRIT: 32 % — AB (ref 34.8–46.6)
HGB: 10.7 g/dL — ABNORMAL LOW (ref 11.6–15.9)
LYMPH#: 0.2 10*3/uL — AB (ref 0.9–3.3)
LYMPH%: 5 % — AB (ref 14.0–49.7)
MCH: 31.1 pg (ref 25.1–34.0)
MCHC: 33.4 g/dL (ref 31.5–36.0)
MCV: 93 fL (ref 79.5–101.0)
MONO#: 0.2 10*3/uL (ref 0.1–0.9)
MONO%: 5.5 % (ref 0.0–14.0)
NEUT#: 3.4 10*3/uL (ref 1.5–6.5)
NEUT%: 89.5 % — AB (ref 38.4–76.8)
PLATELETS: 136 10*3/uL — AB (ref 145–400)
RBC: 3.44 10*6/uL — AB (ref 3.70–5.45)
RDW: 21.8 % — ABNORMAL HIGH (ref 11.2–14.5)
WBC: 3.8 10*3/uL — AB (ref 3.9–10.3)

## 2016-11-02 MED ORDER — PALONOSETRON HCL INJECTION 0.25 MG/5ML
0.2500 mg | Freq: Once | INTRAVENOUS | Status: AC
  Administered 2016-11-02: 0.25 mg via INTRAVENOUS

## 2016-11-02 MED ORDER — SODIUM CHLORIDE 0.9 % IV SOLN
1100.0000 mg | Freq: Once | INTRAVENOUS | Status: AC
  Administered 2016-11-02: 1100 mg via INTRAVENOUS
  Filled 2016-11-02: qty 32

## 2016-11-02 MED ORDER — SODIUM CHLORIDE 0.9% FLUSH
10.0000 mL | INTRAVENOUS | Status: DC | PRN
  Administered 2016-11-02: 10 mL
  Filled 2016-11-02: qty 10

## 2016-11-02 MED ORDER — SODIUM CHLORIDE 0.9 % IV SOLN
Freq: Once | INTRAVENOUS | Status: AC
  Administered 2016-11-02: 12:00:00 via INTRAVENOUS

## 2016-11-02 MED ORDER — PALONOSETRON HCL INJECTION 0.25 MG/5ML
INTRAVENOUS | Status: AC
  Filled 2016-11-02: qty 5

## 2016-11-02 MED ORDER — SODIUM CHLORIDE 0.9 % IV SOLN
500.0000 mg/m2 | Freq: Once | INTRAVENOUS | Status: AC
  Administered 2016-11-02: 900 mg via INTRAVENOUS
  Filled 2016-11-02: qty 20

## 2016-11-02 MED ORDER — HEPARIN SOD (PORK) LOCK FLUSH 100 UNIT/ML IV SOLN
500.0000 [IU] | Freq: Once | INTRAVENOUS | Status: AC | PRN
  Administered 2016-11-02: 500 [IU]
  Filled 2016-11-02: qty 5

## 2016-11-02 MED ORDER — SODIUM CHLORIDE 0.9 % IV SOLN: Freq: Once | INTRAVENOUS | Status: DC

## 2016-11-02 MED ORDER — CARBOPLATIN CHEMO INTRADERMAL TEST DOSE 100MCG/0.02ML
100.0000 ug | Freq: Once | INTRADERMAL | Status: AC
  Administered 2016-11-02: 100 ug via INTRADERMAL
  Filled 2016-11-02: qty 0.01

## 2016-11-02 NOTE — Patient Instructions (Addendum)
[facility] Discharge Instructions for Patients Receiving Chemotherapy  Today you received the following chemotherapy agents:  Avastin and Alimta.  To help prevent nausea and vomiting after your treatment, we encourage you to take your nausea medication as directed.   If you develop nausea and vomiting that is not controlled by your nausea medication, call the clinic.   BELOW ARE SYMPTOMS THAT SHOULD BE REPORTED IMMEDIATELY:  *FEVER GREATER THAN 100.5 F  *CHILLS WITH OR WITHOUT FEVER  NAUSEA AND VOMITING THAT IS NOT CONTROLLED WITH YOUR NAUSEA MEDICATION  *UNUSUAL SHORTNESS OF BREATH  *UNUSUAL BRUISING OR BLEEDING  TENDERNESS IN MOUTH AND THROAT WITH OR WITHOUT PRESENCE OF ULCERS  *URINARY PROBLEMS  *BOWEL PROBLEMS  UNUSUAL RASH Items with * indicate a potential emergency and should be followed up as soon as possible.  Feel free to call the clinic you have any questions or concerns. The clinic phone number is [phone].  Please show the CHEMO ALERT CARD at check-in to the Emergency Department and triage nurse.   

## 2016-11-02 NOTE — Progress Notes (Signed)
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 spurring. Mild canal stenosis. Mild left foraminal narrowing. C6-C7: Mild diffuse disc bulge with left greater than right uncovertebral spurring. Left-sided facet arthrosis. Resultant mild left foraminal narrowing. No significant canal or right foraminal stenosis. C7-T1:  Minimal disc bulge.  No stenosis.  Mild facet hypertrophy. Visualized upper thoracic spine demonstrates mild disc bulging at T1-2 and T2-3 without significant stenosis. IMPRESSION: MRI HEAD IMPRESSION: 1. Interval response to therapy as evidenced by marked interval improvement in previously seen 4 enhancing right frontal lobe lesions, now demonstrating minimal nodular enhancement measuring up to 5 mm as above. Associated vasogenic edema has essentially resolved. 2. No other acute intracranial process identified. 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 MRI CERVICAL SPINE IMPRESSION: 1. No acute abnormality within the cervical spine. No evidence for metastatic disease. 2. Moderate multilevel degenerative spondylolysis as above, most notable at C3-4. Mild canal stenosis at C5-6. No other significant canal narrowing. Mild to moderate multilevel foraminal narrowing as above. These results will be called to the ordering clinician or  representative by the Radiologist Assistant, and communication documented in the PACS or zVision Dashboard. Electronically Signed   By: Jeannine Boga M.D.   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 Additional history of right breast cancer. EXAM: CT CHEST, ABDOMEN, AND PELVIS WITH CONTRAST TECHNIQUE: Multidetector CT imaging of the chest, abdomen and pelvis was performed following the standard protocol during bolus administration of intravenous contrast. CONTRAST:  143m ISOVUE-300 IOPAMIDOL (ISOVUE-300) INJECTION 61% COMPARISON:  07/29/2016 CT chest, abdomen and pelvis. FINDINGS: CT CHEST FINDINGS Cardiovascular: Normal heart size. No significant pericardial fluid/thickening. Left anterior descending and right coronary atherosclerosis. Atherosclerotic nonaneurysmal thoracic aorta. Normal caliber pulmonary arteries. No central pulmonary emboli. Left internal jugular MediPort terminates in the lower third of the superior vena cava. Mediastinum/Nodes: No discrete thyroid nodules. Unremarkable esophagus. Right axillary surgical clips are noted. Mildly enlarged 1.3 cm right lower paratracheal node (series 2/image 18), previously 1.5 cm, slightly decreased. No additional pathologically enlarged mediastinal lymph nodes. No axillary or hilar lymphadenopathy. Lungs/Pleura: No pneumothorax. Trace dependent bilateral pleural effusions appear new. There is new prominent sharply marginated patchy consolidation and ground-glass attenuation in the parahilar/medial left lower lobe. There are similar new sharply marginated regions of patchy consolidation and ground-glass attenuation in  the upper paramediastinal lungs bilaterally. There is volume loss associated with the new opacified lung regions, which are favored represent evolving postradiation change. The treated left lower lobe pulmonary nodule is encompassed by the postradiation change and not discretely visualized. Peripheral left lower lobe 1.6 cm ground-glass pulmonary nodule (series 4/ image 46) is stable. No new significant pulmonary nodules. Mild centrilobular emphysema. Musculoskeletal: No aggressive appearing focal osseous lesions. Moderate thoracic spondylosis. Status post right mastectomy. Right breast prosthesis appears intact. CT ABDOMEN PELVIS FINDINGS Hepatobiliary: Normal liver size. Stable scattered simple liver cysts throughout the liver measuring up to 3.2 cm in the anteromedial segment left liver  lobe. Stable progressively enhancing 3.4 x 2.7 cm peripheral right liver lobe mass (series 2/ image 42), stable back to 12/24/2006 MRI abdomen, compatible with a benign lesion such as an atypical hemangioma. A few additional scattered subcentimeter hypodense lesions throughout the liver are too small to characterize and not appreciably changed. No new liver lesions. Cholecystectomy. Bile ducts are stable and within expected post cholecystectomy limits with common bile duct diameter 6 mm. No radiopaque choledocholithiasis. Stable small to moderate periampullary duodenal diverticulum. Pancreas: Normal, with no mass or duct dilation. Spleen: Normal size. No mass. Adrenals/Urinary Tract: No discrete adrenal nodules. No hydronephrosis. 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 No renal mass. Normal bladder. Stomach/Bowel: Grossly normal stomach. Normal caliber small bowel with no small bowel wall thickening. Normal appendix. Moderate left colonic diverticulosis, with no large bowel wall thickening or pericolonic fat stranding. Oral contrast progresses to the rectum.  Vascular/Lymphatic: Atherosclerotic nonaneurysmal abdominal aorta. Patent portal, splenic, hepatic and renal veins. No pathologically enlarged lymph nodes in the abdomen or pelvis. Reproductive: Status post hysterectomy, with no abnormal findings at the vaginal cuff. No adnexal mass. Other: No pneumoperitoneum, ascites or focal fluid collection. Musculoskeletal: No aggressive appearing focal osseous lesions. Moderate lumbar spondylosis, most prominent at L4-5 with stable 7 mm anterolisthesis at L4-5. Stable small fat containing umbilical hernia. IMPRESSION: 1. New postradiation change in the left lower lobe and upper paramediastinal lungs, which encompasses the treated left lower lobe pulmonary nodule, which is not discretely visualized. 2. Separate left lower lobe 1.6 cm ground-glass pulmonary nodule is stable and warrants continued chest CT surveillance. 3. Continued reduction in mild right lower paratracheal mediastinal lymphadenopathy. 4. Left adrenal metastasis is no longer discretely visualized. 5. No new or progressive metastatic disease in the chest, abdomen or pelvis . 6. New trace dependent bilateral pleural effusions, probably inflammatory given the postradiation change in the lung. 7. Additional findings include aortic atherosclerosis, 2 vessel coronary atherosclerosis, mild centrilobular emphysema and left colonic diverticulosis. Electronically Signed   By: Ilona Sorrel M.D.   On: 10/05/2016 13:27    ASSESSMENT AND PLAN:  This is a very pleasant 76 years old white female with metastatic non-small cell lung cancer adenocarcinoma with brain and adrenal metastasis. She status post a course of concurrent chemoradiation for the locally advanced disease as well as stereotactic body radiotherapy to the solitary adrenal gland lesion. The patient also developed metastatic brain lesions that were treated with stereotactic radiotherapy to the brain. She is currently undergoing systemic chemotherapy was  carboplatin, Alimta and Avastin status post 4 cycles and tolerating her treatment well. I recommended for the patient to proceed with cycle #5 today as a scheduled. She will come back for follow-up visit in 3 weeks for evaluation before starting cycle #6. For hypertension, I recommended for the patient to continue to monitor her blood pressure closely and to take her antidepressant medication as prescribed. For the chemotherapy-induced anemia, we'll continue to monitor her hemoglobin and hematocrit closely and consider the patient for transfusion if needed. For depression, strongly advised the patient to take her antidepressant as prescribed by her primary care physician. She was advised to call immediately if she has any concerning symptoms in the interval. The patient voices understanding of current disease status and treatment options and is in agreement with the current care plan. All questions were answered. The patient knows to call the clinic with any problems, questions or concerns. We can certainly  see the patient much sooner if necessary.  Disclaimer: This note was dictated with voice recognition software. Similar sounding words can inadvertently be transcribed and may not be corrected upon review.

## 2016-11-02 NOTE — Telephone Encounter (Signed)
Gave patient AVS and calender per 4/9 los - 4/30 appt already scheduled. No additional appts needed.

## 2016-11-02 NOTE — Progress Notes (Signed)
Pt has positive reaction to [place] skin test.  Dr. Julien Nordmann notified and carboplatin will be held, but continue with alimta and avastin.  Pt verbalized understanding.  Pharmacy notified and only premed needed for treatment will be aloxi, since [place] is being held.

## 2016-11-09 ENCOUNTER — Other Ambulatory Visit (HOSPITAL_BASED_OUTPATIENT_CLINIC_OR_DEPARTMENT_OTHER): Payer: Medicare Other

## 2016-11-09 DIAGNOSIS — C7972 Secondary malignant neoplasm of left adrenal gland: Secondary | ICD-10-CM

## 2016-11-09 DIAGNOSIS — C7931 Secondary malignant neoplasm of brain: Secondary | ICD-10-CM | POA: Diagnosis not present

## 2016-11-09 DIAGNOSIS — C3431 Malignant neoplasm of lower lobe, right bronchus or lung: Secondary | ICD-10-CM

## 2016-11-09 DIAGNOSIS — C801 Malignant (primary) neoplasm, unspecified: Principal | ICD-10-CM

## 2016-11-09 LAB — COMPREHENSIVE METABOLIC PANEL
ALBUMIN: 3.2 g/dL — AB (ref 3.5–5.0)
ALK PHOS: 33 U/L — AB (ref 40–150)
ALT: 94 U/L — ABNORMAL HIGH (ref 0–55)
AST: 83 U/L — AB (ref 5–34)
Anion Gap: 9 mEq/L (ref 3–11)
BUN: 12 mg/dL (ref 7.0–26.0)
CO2: 26 meq/L (ref 22–29)
Calcium: 9.6 mg/dL (ref 8.4–10.4)
Chloride: 107 mEq/L (ref 98–109)
Creatinine: 0.6 mg/dL (ref 0.6–1.1)
EGFR: 89 mL/min/{1.73_m2} — ABNORMAL LOW (ref >90)
GLUCOSE: 105 mg/dL (ref 70–140)
POTASSIUM: 3.4 meq/L — AB (ref 3.5–5.1)
Sodium: 141 mEq/L (ref 136–145)
Total Bilirubin: 1.13 mg/dL (ref 0.20–1.20)
Total Protein: 5.7 g/dL — ABNORMAL LOW (ref 6.4–8.3)

## 2016-11-09 LAB — CBC WITH DIFFERENTIAL/PLATELET
BASO%: 1.3 % (ref 0.0–2.0)
Basophils Absolute: 0 10*3/uL (ref 0.0–0.1)
EOS%: 4.4 % (ref 0.0–7.0)
Eosinophils Absolute: 0.1 10*3/uL (ref 0.0–0.5)
HCT: 28.3 % — ABNORMAL LOW (ref 34.8–46.6)
HEMOGLOBIN: 9.6 g/dL — AB (ref 11.6–15.9)
LYMPH#: 0.2 10*3/uL — AB (ref 0.9–3.3)
LYMPH%: 15.2 % (ref 14.0–49.7)
MCH: 31.1 pg (ref 25.1–34.0)
MCHC: 33.9 g/dL (ref 31.5–36.0)
MCV: 91.6 fL (ref 79.5–101.0)
MONO#: 0.3 10*3/uL (ref 0.1–0.9)
MONO%: 20.9 % — ABNORMAL HIGH (ref 0.0–14.0)
NEUT#: 0.9 10*3/uL — ABNORMAL LOW (ref 1.5–6.5)
NEUT%: 58.2 % (ref 38.4–76.8)
NRBC: 2 % — AB (ref 0–0)
Platelets: 75 10*3/uL — ABNORMAL LOW (ref 145–400)
RBC: 3.09 10*6/uL — AB (ref 3.70–5.45)
RDW: 19.8 % — AB (ref 11.2–14.5)
WBC: 1.6 10*3/uL — ABNORMAL LOW (ref 3.9–10.3)

## 2016-11-16 ENCOUNTER — Other Ambulatory Visit (HOSPITAL_BASED_OUTPATIENT_CLINIC_OR_DEPARTMENT_OTHER): Payer: Medicare Other

## 2016-11-16 DIAGNOSIS — C801 Malignant (primary) neoplasm, unspecified: Principal | ICD-10-CM

## 2016-11-16 DIAGNOSIS — C3431 Malignant neoplasm of lower lobe, right bronchus or lung: Secondary | ICD-10-CM

## 2016-11-16 DIAGNOSIS — C7972 Secondary malignant neoplasm of left adrenal gland: Secondary | ICD-10-CM

## 2016-11-16 LAB — CBC WITH DIFFERENTIAL/PLATELET
BASO%: 0.3 % (ref 0.0–2.0)
Basophils Absolute: 0 10*3/uL (ref 0.0–0.1)
EOS ABS: 0.1 10*3/uL (ref 0.0–0.5)
EOS%: 2.3 % (ref 0.0–7.0)
HEMATOCRIT: 31 % — AB (ref 34.8–46.6)
HGB: 10.3 g/dL — ABNORMAL LOW (ref 11.6–15.9)
LYMPH#: 0.7 10*3/uL — AB (ref 0.9–3.3)
LYMPH%: 16.6 % (ref 14.0–49.7)
MCH: 31.1 pg (ref 25.1–34.0)
MCHC: 33.2 g/dL (ref 31.5–36.0)
MCV: 93.7 fL (ref 79.5–101.0)
MONO#: 0.6 10*3/uL (ref 0.1–0.9)
MONO%: 14.8 % — ABNORMAL HIGH (ref 0.0–14.0)
NEUT%: 66 % (ref 38.4–76.8)
NEUTROS ABS: 2.6 10*3/uL (ref 1.5–6.5)
PLATELETS: 134 10*3/uL — AB (ref 145–400)
RBC: 3.31 10*6/uL — ABNORMAL LOW (ref 3.70–5.45)
RDW: 20.8 % — ABNORMAL HIGH (ref 11.2–14.5)
WBC: 4 10*3/uL (ref 3.9–10.3)

## 2016-11-16 LAB — COMPREHENSIVE METABOLIC PANEL
ALT: 59 U/L — AB (ref 0–55)
ANION GAP: 13 meq/L — AB (ref 3–11)
AST: 53 U/L — ABNORMAL HIGH (ref 5–34)
Albumin: 3.5 g/dL (ref 3.5–5.0)
Alkaline Phosphatase: 37 U/L — ABNORMAL LOW (ref 40–150)
BUN: 16.1 mg/dL (ref 7.0–26.0)
CALCIUM: 9.9 mg/dL (ref 8.4–10.4)
CO2: 26 mEq/L (ref 22–29)
CREATININE: 0.6 mg/dL (ref 0.6–1.1)
Chloride: 107 mEq/L (ref 98–109)
EGFR: 89 mL/min/{1.73_m2} — AB (ref >90)
Glucose: 86 mg/dl (ref 70–140)
Potassium: 3.5 mEq/L (ref 3.5–5.1)
Sodium: 145 mEq/L (ref 136–145)
TOTAL PROTEIN: 6.1 g/dL — AB (ref 6.4–8.3)
Total Bilirubin: 1.03 mg/dL (ref 0.20–1.20)

## 2016-11-22 ENCOUNTER — Other Ambulatory Visit: Payer: Self-pay | Admitting: Internal Medicine

## 2016-11-23 ENCOUNTER — Telehealth: Payer: Self-pay | Admitting: Internal Medicine

## 2016-11-23 ENCOUNTER — Other Ambulatory Visit (HOSPITAL_BASED_OUTPATIENT_CLINIC_OR_DEPARTMENT_OTHER): Payer: Medicare Other

## 2016-11-23 ENCOUNTER — Ambulatory Visit (HOSPITAL_BASED_OUTPATIENT_CLINIC_OR_DEPARTMENT_OTHER): Payer: Medicare Other

## 2016-11-23 ENCOUNTER — Encounter: Payer: Self-pay | Admitting: Internal Medicine

## 2016-11-23 ENCOUNTER — Ambulatory Visit (HOSPITAL_BASED_OUTPATIENT_CLINIC_OR_DEPARTMENT_OTHER): Payer: Medicare Other | Admitting: Internal Medicine

## 2016-11-23 VITALS — BP 138/89 | HR 82 | Temp 97.8°F | Resp 18 | Ht 62.0 in | Wt 167.1 lb

## 2016-11-23 DIAGNOSIS — Z5111 Encounter for antineoplastic chemotherapy: Secondary | ICD-10-CM | POA: Diagnosis not present

## 2016-11-23 DIAGNOSIS — Z5112 Encounter for antineoplastic immunotherapy: Secondary | ICD-10-CM | POA: Diagnosis not present

## 2016-11-23 DIAGNOSIS — C7972 Secondary malignant neoplasm of left adrenal gland: Secondary | ICD-10-CM

## 2016-11-23 DIAGNOSIS — C801 Malignant (primary) neoplasm, unspecified: Secondary | ICD-10-CM

## 2016-11-23 DIAGNOSIS — C7931 Secondary malignant neoplasm of brain: Secondary | ICD-10-CM | POA: Diagnosis not present

## 2016-11-23 DIAGNOSIS — C3431 Malignant neoplasm of lower lobe, right bronchus or lung: Secondary | ICD-10-CM

## 2016-11-23 DIAGNOSIS — C3491 Malignant neoplasm of unspecified part of right bronchus or lung: Secondary | ICD-10-CM

## 2016-11-23 LAB — COMPREHENSIVE METABOLIC PANEL
ALBUMIN: 3.4 g/dL — AB (ref 3.5–5.0)
ALT: 26 U/L (ref 0–55)
ANION GAP: 11 meq/L (ref 3–11)
AST: 30 U/L (ref 5–34)
Alkaline Phosphatase: 41 U/L (ref 40–150)
BILIRUBIN TOTAL: 0.83 mg/dL (ref 0.20–1.20)
BUN: 16.6 mg/dL (ref 7.0–26.0)
CALCIUM: 10.1 mg/dL (ref 8.4–10.4)
CO2: 25 meq/L (ref 22–29)
CREATININE: 0.7 mg/dL (ref 0.6–1.1)
Chloride: 108 mEq/L (ref 98–109)
EGFR: 83 mL/min/{1.73_m2} — ABNORMAL LOW (ref >90)
Glucose: 117 mg/dl (ref 70–140)
Potassium: 4 mEq/L (ref 3.5–5.1)
Sodium: 144 mEq/L (ref 136–145)
TOTAL PROTEIN: 6.2 g/dL — AB (ref 6.4–8.3)

## 2016-11-23 LAB — CBC WITH DIFFERENTIAL/PLATELET
BASO%: 0 % (ref 0.0–2.0)
Basophils Absolute: 0 10*3/uL (ref 0.0–0.1)
EOS%: 0 % (ref 0.0–7.0)
Eosinophils Absolute: 0 10*3/uL (ref 0.0–0.5)
HEMATOCRIT: 34 % — AB (ref 34.8–46.6)
HEMOGLOBIN: 11.1 g/dL — AB (ref 11.6–15.9)
LYMPH#: 0.2 10*3/uL — AB (ref 0.9–3.3)
LYMPH%: 5.6 % — ABNORMAL LOW (ref 14.0–49.7)
MCH: 31.2 pg (ref 25.1–34.0)
MCHC: 32.6 g/dL (ref 31.5–36.0)
MCV: 95.5 fL (ref 79.5–101.0)
MONO#: 0.4 10*3/uL (ref 0.1–0.9)
MONO%: 11.1 % (ref 0.0–14.0)
NEUT%: 83.3 % — ABNORMAL HIGH (ref 38.4–76.8)
NEUTROS ABS: 2.9 10*3/uL (ref 1.5–6.5)
PLATELETS: 181 10*3/uL (ref 145–400)
RBC: 3.56 10*6/uL — AB (ref 3.70–5.45)
RDW: 20.2 % — AB (ref 11.2–14.5)
WBC: 3.4 10*3/uL — AB (ref 3.9–10.3)

## 2016-11-23 MED ORDER — HEPARIN SOD (PORK) LOCK FLUSH 100 UNIT/ML IV SOLN
500.0000 [IU] | Freq: Once | INTRAVENOUS | Status: AC | PRN
  Administered 2016-11-23: 500 [IU]
  Filled 2016-11-23: qty 5

## 2016-11-23 MED ORDER — PALONOSETRON HCL INJECTION 0.25 MG/5ML
0.2500 mg | Freq: Once | INTRAVENOUS | Status: AC
  Administered 2016-11-23: 0.25 mg via INTRAVENOUS

## 2016-11-23 MED ORDER — FOSAPREPITANT DIMEGLUMINE INJECTION 150 MG
Freq: Once | INTRAVENOUS | Status: AC
  Administered 2016-11-23: 12:00:00 via INTRAVENOUS
  Filled 2016-11-23: qty 5

## 2016-11-23 MED ORDER — SODIUM CHLORIDE 0.9 % IV SOLN
Freq: Once | INTRAVENOUS | Status: AC
  Administered 2016-11-23: 11:00:00 via INTRAVENOUS

## 2016-11-23 MED ORDER — SODIUM CHLORIDE 0.9 % IV SOLN
1100.0000 mg | Freq: Once | INTRAVENOUS | Status: AC
  Administered 2016-11-23: 1100 mg via INTRAVENOUS
  Filled 2016-11-23: qty 32

## 2016-11-23 MED ORDER — SODIUM CHLORIDE 0.9 % IV SOLN
500.0000 mg/m2 | Freq: Once | INTRAVENOUS | Status: AC
  Administered 2016-11-23: 900 mg via INTRAVENOUS
  Filled 2016-11-23: qty 20

## 2016-11-23 MED ORDER — SODIUM CHLORIDE 0.9% FLUSH
10.0000 mL | INTRAVENOUS | Status: DC | PRN
Start: 2016-11-23 — End: 2016-11-23
  Administered 2016-11-23: 10 mL
  Filled 2016-11-23: qty 10

## 2016-11-23 MED ORDER — PALONOSETRON HCL INJECTION 0.25 MG/5ML
INTRAVENOUS | Status: AC
  Filled 2016-11-23: qty 5

## 2016-11-23 NOTE — Telephone Encounter (Signed)
Gave patient AVS and calender per 4/30 [facility] radiology to contact patient with Ct schedule.

## 2016-11-23 NOTE — Progress Notes (Signed)
[place] [phone]   [phone]  OFFICE PROGRESS NOTE  Irven Shelling, MD [place] [address] [place] [address]  DIAGNOSIS: Stage IV (T2a, N3, M1 B) non-small cell lung cancer, adenocarcinoma presented with right lower lobe lung mass, mediastinal and bilateral supraclavicular lymphadenopathy as well as metastatic disease to the left adrenal gland diagnosed in October 2017. The patient also develop multiple metastatic brain lesions in December 2017.  Genomic Alterations Identified? BRAF G469S CDKN2A p16INK4a deletion exons 1-2 and p14ARF deletion exon 2 KMT2C (MLL3) Y5638* LH73 splice site 428J>[id number] Additional Findings? Microsatellite status MS-Stable Tumor Mutation Burden TMB-Intermediate; 12 Muts/Mb Additional Disease-relevant Genes with No Reportable Alterations Identified? EGFR KRAS ALK MET RET ERBB2 ROS1  PRIOR THERAPY:  1) Concurrent chemoradiation with weekly carboplatin for AUC of 2 and paclitaxel 45 MG/M2 status post 6 cycles. 2) stereotactic radiotherapy to the left adrenal gland metastasis. 3) stereotactic radiotherapy to 4 brain lesions under the care of Dr. Tammi Klippel on 07/31/2016.  CURRENT THERAPY: Systemic chemotherapy with carboplatin for AUC of 5, Alimta 500 MG/M2 and Avastin 15 MG/KG every 3 weeks. First dose 08/10/2016. Status post 5 cycles. Carboplatin was discontinued at cycle #5 secondary to hypersensitivity reaction.  INTERVAL HISTORY: Molly Black 76 y.o. female returns to the clinic today for follow-up visit accompanied by her sister. The patient is tolerating her current treatment fairly well with no significant adverse effects. She had hypersensitivity reaction to carboplatin with cycle #5 and this was discontinued. She denied having any fever or chills. She denied having any chest pain, shortness of breath but continues to have mild cough and no hemoptysis. She has no nausea or vomiting. She  denied having any significant weight loss or night sweats. She is here today for evaluation before starting cycle #6.  MEDICAL HISTORY: Past Medical History:  Diagnosis Date  . Adenocarcinoma of right lung, stage 4 ([place]) 05/14/2016  . Antineoplastic chemotherapy induced anemia 10-19-2016  . Anxiety    with death of brother none since  . Carcinoma of breast, stage 2, estrogen receptor negative, right ([place])    T2N0 right breast mastectomy/TRAM reconstruction ER negative PR positive  June 1989  Then [facility] chemo  . Cystadenofibroma of ovary, unspecified laterality   . Diverticulosis of colon without diverticulitis   . Encounter for antineoplastic chemotherapy 06/01/2016  . Gastric ulcer   . GERD (gastroesophageal reflux disease)    takes Nexium daily  . Goals of care, counseling/discussion 08/03/2016  . H/O hiatal hernia   . Hemangioma of liver   . Hiatal hernia   . Metastasis to brain ([place]) dx'd 06/2016  . Neuropathy, peripheral   . Odynophagia 06/29/2016  . OSA on CPAP    setting 15- uses occ.  . Personal history of urinary (tract) infections   . Pneumonia    at age 78 years old  . PONV (postoperative nausea and vomiting)   . Schatzki's ring   . Seizures ([place])   . Shortness of breath   . Skin burn 06/29/2016  . Skin cancer of face    "had some places frozen off" (04/13/2013)  . Tubular adenoma of colon     ALLERGIES:  has No Known Allergies.  MEDICATIONS:  Current Outpatient Prescriptions  Medication Sig Dispense Refill  . ALPRAZolam (XANAX) 0.5 MG tablet     . amLODipine (NORVASC) 5 MG tablet     . aspirin 81 MG tablet Take 81 mg by mouth daily.    Marland Kitchen  beta carotene w/minerals (OCUVITE) tablet Take 1 tablet by mouth daily.    . Calcium Carbonate-Vitamin D (CALCIUM 600+D PO) Take by mouth 2 (two) times daily.    . Cholecalciferol (VITAMIN D3) 3000 UNITS TABS Take 1 tablet by mouth daily.    . ciprofloxacin (CIPRO) 500 MG tablet Take 1 tablet (500 mg total) by mouth 2 (two) times  daily. 14 tablet 0  . Cyanocobalamin 2500 MCG TABS Take 1 tablet by mouth daily.    . cyclobenzaprine (FLEXERIL) 5 MG tablet Take 1 tablet (5 mg total) by mouth 3 (three) times daily as needed for muscle spasms. 30 tablet 0  . dexamethasone (DECADRON) 4 MG tablet 4 mg by mouth twice a day the day before, day of and day after the chemotherapy every 3 weeks. (Patient taking differently: 2 (two) times daily. 4 mg by mouth twice a day the day before, day of and day after the chemotherapy every 3 weeks.) 40 tablet 1  . Diphenhyd-Hydrocort-Nystatin (FIRST-DUKES MOUTHWASH) SUSP Swish and swallow 15 mLs 4 (four) times daily. 237 mL 1  . feeding supplement (ENSURE CLINICAL STRENGTH) LIQD Take 237 mLs by mouth AC breakfast. Takes occasionally    . folic acid (FOLVITE) 1 MG tablet Take 1 tablet (1 mg total) by mouth daily. 30 tablet 4  . Lacosamide (VIMPAT) 150 MG TABS Take 1 tablet (150 mg total) by mouth 2 (two) times daily. 60 tablet 11  . lidocaine-prilocaine (EMLA) cream Apply 1 application topically as needed. Apply 1-2  tsp over port site 1.5 -2 hours prior to chemotherapy. 30 g 0  . losartan (COZAAR) 50 MG tablet     . Multiple Vitamin (MULTIVITAMIN WITH MINERALS) TABS tablet Take 1 tablet by mouth daily.    . Omega-3 Fatty Acids (FISH OIL) 1000 MG CAPS Take 2,000 mg by mouth daily.    Marland Kitchen omeprazole (PRILOSEC) 40 MG capsule Take 1 capsule (40 mg total) by mouth 2 (two) times daily. 30 minutes before breakfast and 30 minutes before dinner 180 capsule 1  . polyethylene glycol (MIRALAX / GLYCOLAX) packet Take 17 g by mouth daily.    . Probiotic Product (SUPER PROBIOTIC PO) Take 1 tablet by mouth daily.    . prochlorperazine (COMPAZINE) 10 MG tablet Take 10 mg by mouth every 6 (six) hours as needed.    . sucralfate (CARAFATE) 1 g tablet Take 1 tablet (1 g total) by mouth 4 (four) times daily. 120 tablet 3   No current facility-administered medications for this visit.     SURGICAL HISTORY:  Past  Surgical History:  Procedure Laterality Date  . ABDOMINAL HYSTERECTOMY  1989  . ANKLE FRACTURE SURGERY Right ?1996  . BREAST BIOPSY Right 1963; 1981; 1989  . BREAST CAPSULECTOMY WITH IMPLANT EXCHANGE Right 1/76/1607   "silicon gel implant" (3/71/0626)  . BREAST IMPLANT REMOVAL Right 04/13/2013   Procedure: REMOVAL RIGHT RUPTURED BREAST IMPLANTS, DELAYED BREAST RECONSTRUCTION WITH SILICONE GEL IMPLANTS;  Surgeon: Crissie Reese, MD;  Location: [place] [facility];  Service: Plastics;  Laterality: Right;  . BREAST LUMPECTOMY Right 1963; 1981; 1989   "benign; benign; malignant"  . BREAST RECONSTRUCTION Right   . BREAST RECONSTRUCTION WITH PLACEMENT OF TISSUE EXPANDER AND FLEX HD (ACELLULAR HYDRATED DERMIS) Right 1989  . CAPSULECTOMY Right 04/13/2013   Procedure: CAPSULECTOMY;  Surgeon: Crissie Reese, MD;  Location: [place];  Service: Plastics;  Laterality: Right;  . CATARACT EXTRACTION W/PHACO Right 10/18/2012   Procedure: CATARACT EXTRACTION PHACO AND INTRAOCULAR LENS PLACEMENT (IOC);  Surgeon: Elta Guadeloupe  Enid Baas, MD;  Location: [facility];  Service: Ophthalmology;  Laterality: Right;  CDE:7.67  . CATARACT EXTRACTION W/PHACO Left 11/01/2012   Procedure: CATARACT EXTRACTION PHACO AND INTRAOCULAR LENS PLACEMENT (IOC);  Surgeon: Elta Guadeloupe T. Gershon Crane, MD;  Location: [facility];  Service: Ophthalmology;  Laterality: Left;  CDE:9.92  . CHOLECYSTECTOMY  1990's  . ESOPHAGOGASTRODUODENOSCOPY N/A 10/13/2013   Procedure: ESOPHAGOGASTRODUODENOSCOPY (EGD);  Surgeon: Jerene Bears, MD;  Location: [facility];  Service: Gastroenterology;  Laterality: N/A;  . IR GENERIC HISTORICAL  08/04/2016   IR FLUORO GUIDE PORT INSERTION LEFT 08/04/2016 Jacqulynn Cadet, MD [facility]  . IR GENERIC HISTORICAL  08/04/2016   IR US GUIDE VASC ACCESS LEFT 08/04/2016 Jacqulynn Cadet, MD [facility]  . MASTECTOMY COMPLETE / SIMPLE W/ SENTINEL NODE BIOPSY Right 1989  . RECONSTRUCTION / CORRECTION OF NIPPLE / AEROLA Right 1989  . WRIST FRACTURE SURGERY Right ~ 2007    "put a pin in it" (04/13/2013)    REVIEW OF SYSTEMS:  A comprehensive review of systems was negative except for: Constitutional: positive for fatigue Respiratory: positive for cough   PHYSICAL EXAMINATION: General appearance: alert, cooperative, fatigued and no distress Head: Normocephalic, without obvious abnormality, atraumatic Neck: no adenopathy, no JVD, supple, symmetrical, trachea midline and thyroid not enlarged, symmetric, no tenderness/mass/nodules Lymph nodes: Cervical, supraclavicular, and axillary nodes normal. Resp: clear to auscultation bilaterally Back: symmetric, no curvature. ROM normal. No CVA tenderness. Cardio: regular rate and rhythm, S1, S2 normal, no murmur, click, rub or gallop GI: soft, non-tender; bowel sounds normal; no masses,  no organomegaly Extremities: extremities normal, atraumatic, no cyanosis or edema  ECOG PERFORMANCE STATUS: 1 - Symptomatic but completely ambulatory  Blood pressure 138/89, pulse 82, temperature 97.8 F (36.6 C), temperature source Oral, resp. rate 18, height 5' 2"  (1.575 m), weight 167 lb 1.6 oz (75.8 kg), SpO2 100 %.  LABORATORY DATA: Lab Results  Component Value Date   WBC 3.4 (L) 11/23/2016   HGB 11.1 (L) 11/23/2016   HCT 34.0 (L) 11/23/2016   MCV 95.5 11/23/2016   PLT 181 11/23/2016      Chemistry      Component Value Date/Time   NA 144 11/23/2016 0923   K 4.0 11/23/2016 0923   CL 107 07/21/2016 1157   CO2 25 11/23/2016 0923   BUN 16.6 11/23/2016 0923   CREATININE 0.7 11/23/2016 0923      Component Value Date/Time   CALCIUM 10.1 11/23/2016 0923   ALKPHOS 41 11/23/2016 0923   AST 30 11/23/2016 0923   ALT 26 11/23/2016 0923   BILITOT 0.83 11/23/2016 0923       RADIOGRAPHIC STUDIES: No results found.  ASSESSMENT AND PLAN:  This is a very pleasant 76 years old white female with metastatic non-small cell lung cancer, adenocarcinoma with metastasis to the brain and adrenal gland. She status post a course of  concurrent chemoradiation to the locally advanced disease in the chest as well as a stereotactic body radiotherapy to the solitary adrenal lesion as well as a stereotactic radiotherapy to the multiple brain lesions. She is currently undergoing systemic chemotherapy with carboplatin, Alimta and Avastin status post 5 cycles. Carboplatin was discontinued at cycle #5 secondary to hypersensitivity reaction to the test dose. I recommended for the patient to proceed with cycle #6 with only Alimta and Avastin. I will see her back for follow-up visit in one month's for evaluation after repeating CT scan of the chest, abdomen and pelvis for restaging of her disease. She was advised  to call immediately if she has any concerning symptoms in the interval. The patient voices understanding of current disease status and treatment options and is in agreement with the current care plan. All questions were answered. The patient knows to call the clinic with any problems, questions or concerns. We can certainly see the patient much sooner if necessary. I spent 10 minutes counseling the patient face to face. The total time spent in the appointment was 15 minutes.  Disclaimer: This note was dictated with voice recognition software. Similar sounding words can inadvertently be transcribed and may not be corrected upon review.

## 2016-11-23 NOTE — Patient Instructions (Signed)
[facility] Discharge Instructions for Patients Receiving Chemotherapy  Today you received the following chemotherapy agents:  Avastin and Alimta.  To help prevent nausea and vomiting after your treatment, we encourage you to take your nausea medication as directed.   If you develop nausea and vomiting that is not controlled by your nausea medication, call the clinic.   BELOW ARE SYMPTOMS THAT SHOULD BE REPORTED IMMEDIATELY:  *FEVER GREATER THAN 100.5 F  *CHILLS WITH OR WITHOUT FEVER  NAUSEA AND VOMITING THAT IS NOT CONTROLLED WITH YOUR NAUSEA MEDICATION  *UNUSUAL SHORTNESS OF BREATH  *UNUSUAL BRUISING OR BLEEDING  TENDERNESS IN MOUTH AND THROAT WITH OR WITHOUT PRESENCE OF ULCERS  *URINARY PROBLEMS  *BOWEL PROBLEMS  UNUSUAL RASH Items with * indicate a potential emergency and should be followed up as soon as possible.  Feel free to call the clinic you have any questions or concerns. The clinic phone number is [phone].  Please show the CHEMO ALERT CARD at check-in to the Emergency Department and triage nurse.   

## 2016-11-30 ENCOUNTER — Other Ambulatory Visit (HOSPITAL_BASED_OUTPATIENT_CLINIC_OR_DEPARTMENT_OTHER): Payer: Medicare Other

## 2016-11-30 DIAGNOSIS — C3431 Malignant neoplasm of lower lobe, right bronchus or lung: Secondary | ICD-10-CM

## 2016-11-30 DIAGNOSIS — C801 Malignant (primary) neoplasm, unspecified: Principal | ICD-10-CM

## 2016-11-30 DIAGNOSIS — C7972 Secondary malignant neoplasm of left adrenal gland: Secondary | ICD-10-CM

## 2016-11-30 LAB — CBC WITH DIFFERENTIAL/PLATELET
BASO%: 1.1 % (ref 0.0–2.0)
Basophils Absolute: 0 10*3/uL (ref 0.0–0.1)
EOS%: 4.8 % (ref 0.0–7.0)
Eosinophils Absolute: 0.1 10*3/uL (ref 0.0–0.5)
HCT: 30.3 % — ABNORMAL LOW (ref 34.8–46.6)
HGB: 10.5 g/dL — ABNORMAL LOW (ref 11.6–15.9)
LYMPH%: 11 % — AB (ref 14.0–49.7)
MCH: 32.6 pg (ref 25.1–34.0)
MCHC: 34.6 g/dL (ref 31.5–36.0)
MCV: 94.2 fL (ref 79.5–101.0)
MONO#: 0.3 10*3/uL (ref 0.1–0.9)
MONO%: 20.2 % — AB (ref 0.0–14.0)
NEUT%: 62.9 % (ref 38.4–76.8)
NEUTROS ABS: 1 10*3/uL — AB (ref 1.5–6.5)
Platelets: 133 10*3/uL — ABNORMAL LOW (ref 145–400)
RBC: 3.21 10*6/uL — AB (ref 3.70–5.45)
RDW: 19.6 % — ABNORMAL HIGH (ref 11.2–14.5)
WBC: 1.6 10*3/uL — AB (ref 3.9–10.3)
lymph#: 0.2 10*3/uL — ABNORMAL LOW (ref 0.9–3.3)

## 2016-11-30 LAB — COMPREHENSIVE METABOLIC PANEL
ALT: 71 U/L — AB (ref 0–55)
AST: 71 U/L — ABNORMAL HIGH (ref 5–34)
Albumin: 3.3 g/dL — ABNORMAL LOW (ref 3.5–5.0)
Alkaline Phosphatase: 39 U/L — ABNORMAL LOW (ref 40–150)
Anion Gap: 9 mEq/L (ref 3–11)
BILIRUBIN TOTAL: 0.91 mg/dL (ref 0.20–1.20)
BUN: 11.8 mg/dL (ref 7.0–26.0)
CO2: 26 meq/L (ref 22–29)
CREATININE: 0.7 mg/dL (ref 0.6–1.1)
Calcium: 9.4 mg/dL (ref 8.4–10.4)
Chloride: 106 mEq/L (ref 98–109)
EGFR: 88 mL/min/{1.73_m2} — ABNORMAL LOW (ref >90)
GLUCOSE: 127 mg/dL (ref 70–140)
Potassium: 3.6 mEq/L (ref 3.5–5.1)
SODIUM: 142 meq/L (ref 136–145)
TOTAL PROTEIN: 5.8 g/dL — AB (ref 6.4–8.3)

## 2016-11-30 LAB — UA PROTEIN, DIPSTICK - CHCC: Protein, ur: <30 mg/dL

## 2016-11-30 LAB — TECHNOLOGIST REVIEW

## 2016-12-07 ENCOUNTER — Other Ambulatory Visit (HOSPITAL_BASED_OUTPATIENT_CLINIC_OR_DEPARTMENT_OTHER): Payer: Medicare Other

## 2016-12-07 DIAGNOSIS — C7972 Secondary malignant neoplasm of left adrenal gland: Secondary | ICD-10-CM

## 2016-12-07 DIAGNOSIS — C801 Malignant (primary) neoplasm, unspecified: Principal | ICD-10-CM

## 2016-12-07 DIAGNOSIS — C3431 Malignant neoplasm of lower lobe, right bronchus or lung: Secondary | ICD-10-CM | POA: Diagnosis not present

## 2016-12-07 LAB — COMPREHENSIVE METABOLIC PANEL
ALBUMIN: 3.2 g/dL — AB (ref 3.5–5.0)
ALK PHOS: 36 U/L — AB (ref 40–150)
ALT: 36 U/L (ref 0–55)
AST: 35 U/L — ABNORMAL HIGH (ref 5–34)
Anion Gap: 10 mEq/L (ref 3–11)
BILIRUBIN TOTAL: 0.72 mg/dL (ref 0.20–1.20)
BUN: 17.1 mg/dL (ref 7.0–26.0)
CALCIUM: 9.5 mg/dL (ref 8.4–10.4)
CO2: 25 mEq/L (ref 22–29)
Chloride: 109 mEq/L (ref 98–109)
Creatinine: 0.7 mg/dL (ref 0.6–1.1)
EGFR: 88 mL/min/{1.73_m2} — AB (ref >90)
GLUCOSE: 77 mg/dL (ref 70–140)
Potassium: 3.9 mEq/L (ref 3.5–5.1)
Sodium: 144 mEq/L (ref 136–145)
TOTAL PROTEIN: 5.9 g/dL — AB (ref 6.4–8.3)

## 2016-12-07 LAB — CBC WITH DIFFERENTIAL/PLATELET
BASO%: 0.2 % (ref 0.0–2.0)
BASOS ABS: 0 10*3/uL (ref 0.0–0.1)
EOS ABS: 0.1 10*3/uL (ref 0.0–0.5)
EOS%: 2.4 % (ref 0.0–7.0)
HEMATOCRIT: 31.8 % — AB (ref 34.8–46.6)
HEMOGLOBIN: 10.3 g/dL — AB (ref 11.6–15.9)
LYMPH#: 0.8 10*3/uL — AB (ref 0.9–3.3)
LYMPH%: 18.4 % (ref 14.0–49.7)
MCH: 31.3 pg (ref 25.1–34.0)
MCHC: 32.4 g/dL (ref 31.5–36.0)
MCV: 96.7 fL (ref 79.5–101.0)
MONO#: 0.6 10*3/uL (ref 0.1–0.9)
MONO%: 13.6 % (ref 0.0–14.0)
NEUT#: 2.7 10*3/uL (ref 1.5–6.5)
NEUT%: 65.4 % (ref 38.4–76.8)
NRBC: 1 % — AB (ref 0–0)
PLATELETS: 131 10*3/uL — AB (ref 145–400)
RBC: 3.29 10*6/uL — ABNORMAL LOW (ref 3.70–5.45)
RDW: 19 % — AB (ref 11.2–14.5)
WBC: 4.1 10*3/uL (ref 3.9–10.3)

## 2016-12-09 ENCOUNTER — Other Ambulatory Visit (HOSPITAL_COMMUNITY): Payer: Self-pay | Admitting: Nurse Practitioner

## 2016-12-09 ENCOUNTER — Ambulatory Visit (HOSPITAL_COMMUNITY)
Admission: RE | Admit: 2016-12-09 | Discharge: 2016-12-09 | Disposition: A | Discharge status: Home / Self Care | Payer: Medicare Other | Source: Ambulatory Visit | Attending: Nurse Practitioner | Admitting: Nurse Practitioner

## 2016-12-09 DIAGNOSIS — I158 Other secondary hypertension: Secondary | ICD-10-CM | POA: Diagnosis present

## 2016-12-09 DIAGNOSIS — M79605 Pain in left leg: Secondary | ICD-10-CM

## 2016-12-09 DIAGNOSIS — M7989 Other specified soft tissue disorders: Secondary | ICD-10-CM | POA: Diagnosis not present

## 2016-12-09 NOTE — Progress Notes (Signed)
**  Preliminary report by tech**  Left lower extremity venous duplex complete. There is evidence of acute deep vein thrombosis involving the peroneal veins of the left lower extremity. There is no evidence of superficial vein thrombosis involving the left lower extremity. There is no evidence of a Baker's cyst on the left. Results were faxed to Dianna Rossetti NP.  12/09/16 2:22 PM Molly Black RVT

## 2016-12-10 ENCOUNTER — Other Ambulatory Visit: Payer: Self-pay | Admitting: Radiation Therapy

## 2016-12-10 DIAGNOSIS — C7931 Secondary malignant neoplasm of brain: Secondary | ICD-10-CM

## 2016-12-14 ENCOUNTER — Other Ambulatory Visit (HOSPITAL_BASED_OUTPATIENT_CLINIC_OR_DEPARTMENT_OTHER): Payer: Medicare Other

## 2016-12-14 DIAGNOSIS — C7972 Secondary malignant neoplasm of left adrenal gland: Secondary | ICD-10-CM | POA: Diagnosis not present

## 2016-12-14 DIAGNOSIS — C801 Malignant (primary) neoplasm, unspecified: Secondary | ICD-10-CM | POA: Diagnosis not present

## 2016-12-14 LAB — COMPREHENSIVE METABOLIC PANEL
ALT: 33 U/L (ref 0–55)
ANION GAP: 13 meq/L — AB (ref 3–11)
AST: 41 U/L — ABNORMAL HIGH (ref 5–34)
Albumin: 3.5 g/dL (ref 3.5–5.0)
Alkaline Phosphatase: 43 U/L (ref 40–150)
BUN: 14.3 mg/dL (ref 7.0–26.0)
CALCIUM: 10.1 mg/dL (ref 8.4–10.4)
CHLORIDE: 106 meq/L (ref 98–109)
CO2: 24 mEq/L (ref 22–29)
CREATININE: 0.7 mg/dL (ref 0.6–1.1)
EGFR: 84 mL/min/{1.73_m2} — ABNORMAL LOW (ref >90)
Glucose: 91 mg/dl (ref 70–140)
POTASSIUM: 3.4 meq/L — AB (ref 3.5–5.1)
Sodium: 143 mEq/L (ref 136–145)
Total Bilirubin: 0.87 mg/dL (ref 0.20–1.20)
Total Protein: 6.4 g/dL (ref 6.4–8.3)

## 2016-12-14 LAB — CBC WITH DIFFERENTIAL/PLATELET
BASO%: 0.3 % (ref 0.0–2.0)
Basophils Absolute: 0 10*3/uL (ref 0.0–0.1)
EOS ABS: 0.1 10*3/uL (ref 0.0–0.5)
EOS%: 2.8 % (ref 0.0–7.0)
HEMATOCRIT: 34.7 % — AB (ref 34.8–46.6)
HGB: 11.5 g/dL — ABNORMAL LOW (ref 11.6–15.9)
LYMPH#: 0.5 10*3/uL — AB (ref 0.9–3.3)
LYMPH%: 14.1 % (ref 14.0–49.7)
MCH: 31.6 pg (ref 25.1–34.0)
MCHC: 33.1 g/dL (ref 31.5–36.0)
MCV: 95.3 fL (ref 79.5–101.0)
MONO#: 0.4 10*3/uL (ref 0.1–0.9)
MONO%: 11.3 % (ref 0.0–14.0)
NEUT#: 2.3 10*3/uL (ref 1.5–6.5)
NEUT%: 71.5 % (ref 38.4–76.8)
PLATELETS: 187 10*3/uL (ref 145–400)
RBC: 3.64 10*6/uL — ABNORMAL LOW (ref 3.70–5.45)
RDW: 18.1 % — ABNORMAL HIGH (ref 11.2–14.5)
WBC: 3.3 10*3/uL — AB (ref 3.9–10.3)

## 2016-12-15 ENCOUNTER — Ambulatory Visit
Admission: RE | Admit: 2016-12-15 | Discharge: 2016-12-15 | Disposition: A | Discharge status: Home / Self Care | Payer: Medicare Other | Source: Ambulatory Visit | Attending: Internal Medicine | Admitting: Internal Medicine

## 2016-12-15 DIAGNOSIS — Z1231 Encounter for screening mammogram for malignant neoplasm of breast: Secondary | ICD-10-CM

## 2016-12-22 ENCOUNTER — Ambulatory Visit (HOSPITAL_COMMUNITY)
Admission: RE | Admit: 2016-12-22 | Discharge: 2016-12-22 | Disposition: A | Discharge status: Home / Self Care | Payer: Medicare Other | Source: Ambulatory Visit | Attending: Internal Medicine | Admitting: Internal Medicine

## 2016-12-22 DIAGNOSIS — Z9049 Acquired absence of other specified parts of digestive tract: Secondary | ICD-10-CM | POA: Insufficient documentation

## 2016-12-22 DIAGNOSIS — C7972 Secondary malignant neoplasm of left adrenal gland: Secondary | ICD-10-CM | POA: Diagnosis present

## 2016-12-22 DIAGNOSIS — C801 Malignant (primary) neoplasm, unspecified: Secondary | ICD-10-CM | POA: Insufficient documentation

## 2016-12-22 DIAGNOSIS — K469 Unspecified abdominal hernia without obstruction or gangrene: Secondary | ICD-10-CM | POA: Diagnosis not present

## 2016-12-22 DIAGNOSIS — K7689 Other specified diseases of liver: Secondary | ICD-10-CM | POA: Diagnosis not present

## 2016-12-22 DIAGNOSIS — C7931 Secondary malignant neoplasm of brain: Secondary | ICD-10-CM | POA: Insufficient documentation

## 2016-12-22 DIAGNOSIS — Z90710 Acquired absence of both cervix and uterus: Secondary | ICD-10-CM | POA: Insufficient documentation

## 2016-12-22 DIAGNOSIS — K5730 Diverticulosis of large intestine without perforation or abscess without bleeding: Secondary | ICD-10-CM | POA: Insufficient documentation

## 2016-12-22 DIAGNOSIS — I700 Atherosclerosis of aorta: Secondary | ICD-10-CM | POA: Insufficient documentation

## 2016-12-22 DIAGNOSIS — C3491 Malignant neoplasm of unspecified part of right bronchus or lung: Secondary | ICD-10-CM | POA: Diagnosis present

## 2016-12-22 DIAGNOSIS — Z5111 Encounter for antineoplastic chemotherapy: Secondary | ICD-10-CM

## 2016-12-22 MED ORDER — IOPAMIDOL (ISOVUE-300) INJECTION 61%
INTRAVENOUS | Status: AC
  Filled 2016-12-22: qty 100

## 2016-12-22 MED ORDER — IOPAMIDOL (ISOVUE-300) INJECTION 61%
INTRAVENOUS | Status: AC
  Administered 2016-12-22: 100 mL
  Filled 2016-12-22: qty 100

## 2016-12-24 ENCOUNTER — Encounter: Payer: Self-pay | Admitting: Internal Medicine

## 2016-12-24 ENCOUNTER — Ambulatory Visit (HOSPITAL_BASED_OUTPATIENT_CLINIC_OR_DEPARTMENT_OTHER): Payer: Medicare Other | Admitting: Internal Medicine

## 2016-12-24 VITALS — BP 144/76 | HR 61 | Temp 98.3°F | Resp 18 | Ht 62.0 in | Wt 164.6 lb

## 2016-12-24 DIAGNOSIS — Z86718 Personal history of other venous thrombosis and embolism: Secondary | ICD-10-CM

## 2016-12-24 DIAGNOSIS — C3491 Malignant neoplasm of unspecified part of right bronchus or lung: Secondary | ICD-10-CM | POA: Diagnosis not present

## 2016-12-24 DIAGNOSIS — C801 Malignant (primary) neoplasm, unspecified: Secondary | ICD-10-CM

## 2016-12-24 DIAGNOSIS — C7931 Secondary malignant neoplasm of brain: Secondary | ICD-10-CM

## 2016-12-24 DIAGNOSIS — Z7901 Long term (current) use of anticoagulants: Secondary | ICD-10-CM | POA: Diagnosis not present

## 2016-12-24 DIAGNOSIS — C7972 Secondary malignant neoplasm of left adrenal gland: Secondary | ICD-10-CM | POA: Diagnosis not present

## 2016-12-24 DIAGNOSIS — R2243 Localized swelling, mass and lump, lower limb, bilateral: Secondary | ICD-10-CM

## 2016-12-24 DIAGNOSIS — Z5111 Encounter for antineoplastic chemotherapy: Secondary | ICD-10-CM

## 2016-12-24 DIAGNOSIS — Z7189 Other specified counseling: Secondary | ICD-10-CM

## 2016-12-24 NOTE — Progress Notes (Signed)
Molly Black [phone]   [phone]  OFFICE PROGRESS NOTE  Molly Orn, MD [address] [place] [address]  DIAGNOSIS: Stage IV (T2a, N3, M1 B) non-small cell lung cancer, adenocarcinoma presented with right lower lobe lung mass, mediastinal and bilateral supraclavicular lymphadenopathy as well as metastatic disease to the left adrenal gland diagnosed in October 2017. The patient also develop multiple metastatic brain lesions in December 2017.  Genomic Alterations Identified? BRAF G469S CDKN2A p16INK4a deletion exons 1-2 and p14ARF deletion exon 2 KMT2C (MLL3) D4081* KG81 splice site [id number]>J Additional Findings? Microsatellite status MS-Stable Tumor Mutation Burden TMB-Intermediate; 12 Muts/Mb Additional Disease-relevant Genes with No Reportable Alterations Identified? EGFR KRAS ALK MET RET ERBB2 ROS1  PRIOR THERAPY:  1) Concurrent chemoradiation with weekly carboplatin for AUC of 2 and paclitaxel 45 MG/M2 status post 6 cycles. 2) stereotactic radiotherapy to the left adrenal gland metastasis. 3) stereotactic radiotherapy to 4 brain lesions under the care of Dr. Tammi Black on 07/31/2016. 4) Systemic chemotherapy with carboplatin for AUC of 5, Alimta 500 MG/M2 and Avastin 15 MG/KG every 3 weeks. First dose 08/10/2016. Status post 6 cycles. Last cycle was given on 11/23/2016. Carboplatin was discontinued at cycle #5 secondary to hypersensitivity reaction.  CURRENT THERAPY: Maintenance systemic chemotherapy with single agent Alimta 500 MG/M2 every 3 weeks. First dose 01/04/2017  INTERVAL HISTORY: Molly Black [facility] 76 y.o. female returns to the clinic today for follow-up visit accompanied by her daughter. The patient is feeling fine today with no specific complaints. She tolerated the last cycle of her treatment with Alimta and Avastin fairly well. She denied having any bleeding issues. She has no chest pain, shortness of  breath, cough or hemoptysis. She denied having any fever or chills. She has no nausea, vomiting, diarrhea or constipation. She is here today for evaluation after repeating CT scan of the chest, abdomen and pelvis for restaging of her disease.  MEDICAL HISTORY: Past Medical History:  Diagnosis Date  . Adenocarcinoma of right lung, stage 4 ([place]) 05/14/2016  . Antineoplastic chemotherapy induced anemia 2016-09-27  . Anxiety    with death of brother none since  . Carcinoma of breast, stage 2, estrogen receptor negative, right ([place])    T2N0 right breast mastectomy/TRAM reconstruction ER negative PR positive  June 1989  Then [facility] chemo  . Cystadenofibroma of ovary, unspecified laterality   . Diverticulosis of colon without diverticulitis   . Encounter for antineoplastic chemotherapy 06/01/2016  . Gastric ulcer   . GERD (gastroesophageal reflux disease)    takes Nexium daily  . Goals of care, counseling/discussion 08/03/2016  . H/O hiatal hernia   . Hemangioma of liver   . Hiatal hernia   . Metastasis to brain ([place]) dx'd 06/2016  . Neuropathy, peripheral   . Odynophagia 06/29/2016  . OSA on CPAP    setting 15- uses occ.  . Personal history of urinary (tract) infections   . Pneumonia    at age 26 years old  . PONV (postoperative nausea and vomiting)   . Schatzki's ring   . Seizures ([place])   . Shortness of breath   . Skin burn 06/29/2016  . Skin cancer of face    "had some places frozen off" (04/13/2013)  . Tubular adenoma of colon     ALLERGIES:  has No Known Allergies.  MEDICATIONS:  Current Outpatient Prescriptions  Medication Sig Dispense Refill  . ALPRAZolam (XANAX) 0.5 MG tablet     .  amLODipine (NORVASC) 5 MG tablet     . aspirin 81 MG tablet Take 81 mg by mouth daily.    . beta carotene w/minerals (OCUVITE) tablet Take 1 tablet by mouth daily.    . Calcium Carbonate-Vitamin D (CALCIUM 600+D PO) Take by mouth 2 (two) times daily.    . Cholecalciferol (VITAMIN D3) 3000 UNITS TABS  Take 1 tablet by mouth daily.    . ciprofloxacin (CIPRO) 500 MG tablet Take 1 tablet (500 mg total) by mouth 2 (two) times daily. 14 tablet 0  . Cyanocobalamin 2500 MCG TABS Take 1 tablet by mouth daily.    . cyclobenzaprine (FLEXERIL) 5 MG tablet Take 1 tablet (5 mg total) by mouth 3 (three) times daily as needed for muscle spasms. 30 tablet 0  . dexamethasone (DECADRON) 4 MG tablet 4 mg by mouth twice a day the day before, day of and day after the chemotherapy every 3 weeks. (Patient taking differently: 2 (two) times daily. 4 mg by mouth twice a day the day before, day of and day after the chemotherapy every 3 weeks.) 40 tablet 1  . Diphenhyd-Hydrocort-Nystatin (FIRST-DUKES MOUTHWASH) SUSP Swish and swallow 15 mLs 4 (four) times daily. 237 mL 1  . feeding supplement (ENSURE CLINICAL STRENGTH) LIQD Take 237 mLs by mouth AC breakfast. Takes occasionally    . folic acid (FOLVITE) 1 MG tablet Take 1 tablet (1 mg total) by mouth daily. 30 tablet 4  . Lacosamide (VIMPAT) 150 MG TABS Take 1 tablet (150 mg total) by mouth 2 (two) times daily. 60 tablet 11  . lidocaine-prilocaine (EMLA) cream Apply 1 application topically as needed. Apply 1-2  tsp over port site 1.5 -2 hours prior to chemotherapy. 30 g 0  . losartan (COZAAR) 50 MG tablet     . Multiple Vitamin (MULTIVITAMIN WITH MINERALS) TABS tablet Take 1 tablet by mouth daily.    . Omega-3 Fatty Acids (FISH OIL) 1000 MG CAPS Take 2,000 mg by mouth daily.    Marland Kitchen omeprazole (PRILOSEC) 40 MG capsule Take 1 capsule (40 mg total) by mouth 2 (two) times daily. 30 minutes before breakfast and 30 minutes before dinner 180 capsule 1  . polyethylene glycol (MIRALAX / GLYCOLAX) packet Take 17 g by mouth daily.    . Probiotic Product (SUPER PROBIOTIC PO) Take 1 tablet by mouth daily.    . prochlorperazine (COMPAZINE) 10 MG tablet Take 10 mg by mouth every 6 (six) hours as needed.    . sucralfate (CARAFATE) 1 g tablet Take 1 tablet (1 g total) by mouth 4 (four) times  daily. 120 tablet 3  . enoxaparin (LOVENOX) 120 MG/0.8ML injection     . hydrochlorothiazide (MICROZIDE) 12.5 MG capsule      No current facility-administered medications for this visit.     SURGICAL HISTORY:  Past Surgical History:  Procedure Laterality Date  . ABDOMINAL HYSTERECTOMY  1989  . ANKLE FRACTURE SURGERY Right ?1996  . BREAST BIOPSY Right 1963; 1981; 1989  . BREAST CAPSULECTOMY WITH IMPLANT EXCHANGE Right 8/41/6606   "silicon gel implant" (09/24/6008)  . BREAST IMPLANT REMOVAL Right 04/13/2013   Procedure: REMOVAL RIGHT RUPTURED BREAST IMPLANTS, DELAYED BREAST RECONSTRUCTION WITH SILICONE GEL IMPLANTS;  Surgeon: Crissie Reese, MD;  Location: [facility];  Service: Plastics;  Laterality: Right;  . BREAST LUMPECTOMY Right 1963; 1981; 1989   "benign; benign; malignant"  . BREAST RECONSTRUCTION Right   . BREAST RECONSTRUCTION WITH PLACEMENT OF TISSUE EXPANDER AND FLEX HD (ACELLULAR HYDRATED DERMIS) Right 1989  .  CAPSULECTOMY Right 04/13/2013   Procedure: CAPSULECTOMY;  Surgeon: Crissie Reese, MD;  Location: [facility];  Service: Plastics;  Laterality: Right;  . CATARACT EXTRACTION W/PHACO Right 10/18/2012   Procedure: CATARACT EXTRACTION PHACO AND INTRAOCULAR LENS PLACEMENT (IOC);  Surgeon: Elta Guadeloupe T. Gershon Crane, MD;  Location: [facility];  Service: Ophthalmology;  Laterality: Right;  CDE:7.67  . CATARACT EXTRACTION W/PHACO Left 11/01/2012   Procedure: CATARACT EXTRACTION PHACO AND INTRAOCULAR LENS PLACEMENT (IOC);  Surgeon: Elta Guadeloupe T. Gershon Crane, MD;  Location: [facility];  Service: Ophthalmology;  Laterality: Left;  CDE:9.92  . CHOLECYSTECTOMY  1990's  . ESOPHAGOGASTRODUODENOSCOPY N/A 10/13/2013   Procedure: ESOPHAGOGASTRODUODENOSCOPY (EGD);  Surgeon: Jerene Bears, MD;  Location: [facility];  Service: Gastroenterology;  Laterality: N/A;  . IR GENERIC HISTORICAL  08/04/2016   IR FLUORO GUIDE PORT INSERTION LEFT 08/04/2016 Jacqulynn Cadet, MD [facility]  . IR GENERIC HISTORICAL  08/04/2016   IR US GUIDE VASC ACCESS  LEFT 08/04/2016 Jacqulynn Cadet, MD [facility]  . MASTECTOMY COMPLETE / SIMPLE W/ SENTINEL NODE BIOPSY Right 1989  . RECONSTRUCTION / CORRECTION OF NIPPLE / AEROLA Right 1989  . WRIST FRACTURE SURGERY Right ~ 2007   "put a pin in it" (04/13/2013)    REVIEW OF SYSTEMS:  Constitutional: negative Eyes: negative Ears, nose, mouth, throat, and face: negative Respiratory: positive for dyspnea on exertion Cardiovascular: negative Gastrointestinal: negative Genitourinary:negative Integument/breast: negative Hematologic/lymphatic: negative Musculoskeletal:negative Neurological: negative Behavioral/Psych: negative Endocrine: negative Allergic/Immunologic: negative   PHYSICAL EXAMINATION: General appearance: alert, cooperative and no distress Head: Normocephalic, without obvious abnormality, atraumatic Neck: no adenopathy, no JVD, supple, symmetrical, trachea midline and thyroid not enlarged, symmetric, no tenderness/mass/nodules Lymph nodes: Cervical, supraclavicular, and axillary nodes normal. Resp: clear to auscultation bilaterally Back: symmetric, no curvature. ROM normal. No CVA tenderness. Cardio: regular rate and rhythm, S1, S2 normal, no murmur, click, rub or gallop GI: soft, non-tender; bowel sounds normal; no masses,  no organomegaly Extremities: extremities normal, atraumatic, no cyanosis or edema Neurologic: Alert and oriented X 3, normal strength and tone. Normal symmetric reflexes. Normal coordination and gait  ECOG PERFORMANCE STATUS: 1 - Symptomatic but completely ambulatory  Blood pressure (!) 144/76, pulse 61, temperature 98.3 F (36.8 C), temperature source Oral, resp. rate 18, height _0  (1.575 m), weight 164 lb 9.6 oz (74.7 kg), SpO2 98 %.  LABORATORY DATA: Lab Results  Component Value Date   WBC 3.3 (L) 12/14/2016   HGB 11.5 (L) 12/14/2016   HCT 34.7 (L) 12/14/2016   MCV 95.3 12/14/2016   PLT 187 12/14/2016      Chemistry      Component Value Date/Time     NA 143 12/14/2016 0902   K 3.4 (L) 12/14/2016 0902   CL 107 07/21/2016 1157   CO2 24 12/14/2016 0902   BUN 14.3 12/14/2016 0902   CREATININE 0.7 12/14/2016 0902      Component Value Date/Time   CALCIUM 10.1 12/14/2016 0902   ALKPHOS 43 12/14/2016 0902   AST 41 (H) 12/14/2016 0902   ALT 33 12/14/2016 0902   BILITOT 0.87 12/14/2016 0902       RADIOGRAPHIC STUDIES: Ct Chest W Contrast  Result Date: 12/22/2016 CLINICAL DATA:  Right lung cancer with left adrenal metastasis diagnosed in 2017, brain metastases diagnosed December 2017, status post radiation therapy, not on chemotherapy due to toxicity. Remote history of right breast cancer in 1988. EXAM: CT CHEST, ABDOMEN, AND PELVIS WITH CONTRAST TECHNIQUE: Multidetector CT imaging of the chest, abdomen and pelvis was performed following the  standard protocol during bolus administration of intravenous contrast. CONTRAST:  171m ISOVUE-300 IOPAMIDOL (ISOVUE-300) INJECTION 61% COMPARISON:  10/05/2016. FINDINGS: CT CHEST FINDINGS Cardiovascular: The heart is normal in size. Trace pericardial fluid posteriorly (series 2/image 30). Coronary atherosclerosis of the LAD and right coronary artery. No evidence of thoracic aortic aneurysm. Left chest port terminates the cavoatrial junction. Mediastinum/Nodes: Small mediastinal lymph nodes, including an 8 mm short axis low right paratracheal node and a 10 mm short axis AP window node (series 2/ image 16). No suspicious hilar or axillary lymphadenopathy. Mild wall thickening along the mid/ distal esophagus (series 2/ image 21). Visualized thyroid is unremarkable. Lungs/Pleura: Radiation changes in the medial right upper lobe/ paramediastinal region (series 6/image 28). Faint adjacent ground-glass nodularity in the right upper lobe measuring up to 8 mm (series 6/image 23), nonspecific. Radiation changes in the left lower lobe (series 6/image 53). Adjacent discrete ground-glass nodule in the left lower lobe  measuring up to 1.6 cm (series 6/ image 44), unchanged, without definite solid component. No pleural effusion or pneumothorax. Musculoskeletal: Status post right mastectomy with reconstruction. Degenerative changes of the thoracic spine. CT ABDOMEN PELVIS FINDINGS Hepatobiliary: Scattered hepatic cysts, measuring up to 3.5 cm in segment 4B (series 2/image 50). Additional 3.4 cm atypical enhancing lesion in segment 6 (series 2/image 50),. No new/ suspicious hepatic lesions. Status post cholecystectomy. No intrahepatic or extrahepatic ductal dilatation. Pancreas: Within normal limits. Spleen: Within normal limits. Adrenals/Urinary Tract: Adrenal glands are within normal limits. Kidneys are within normal limits.  No hydronephrosis. Bladder is within normal limits. Stomach/Bowel: Stomach is notable for a small hiatal hernia. No evidence of bowel obstruction. Duodenal diverticulum (series 2/image 60). Normal appendix (series 2/ image 95). Mild left colonic diverticulosis, without evidence of diverticulitis. Vascular/Lymphatic: No evidence of abdominal aortic aneurysm. Atherosclerotic calcifications of the abdominal aorta and branch vessels. No suspicious abdominopelvic lymphadenopathy. Reproductive: Status post hysterectomy. No adnexal masses. Other: No abdominopelvic ascites. Musculoskeletal: Grade 1 anterolisthesis of L4 on L5. IMPRESSION: Radiation changes in the right upper lobe/paramediastinal region and left lower lobe. Small mediastinal lymph nodes measuring up to 10 mm short axis, stable versus mildly decreased. Stable 16 mm ground-glass nodule in the left lower lobe. Annual surveillance is suggested. No evidence of metastatic disease in the abdomen/pelvis. Electronically Signed   By: SJulian HyM.D.   On: 12/22/2016 09:52   Ct Abdomen Pelvis W Contrast  Result Date: 12/22/2016 CLINICAL DATA:  Right lung cancer with left adrenal metastasis diagnosed in 2017, brain metastases diagnosed December 2017,  status post radiation therapy, not on chemotherapy due to toxicity. Remote history of right breast cancer in 1988. EXAM: CT CHEST, ABDOMEN, AND PELVIS WITH CONTRAST TECHNIQUE: Multidetector CT imaging of the chest, abdomen and pelvis was performed following the standard protocol during bolus administration of intravenous contrast. CONTRAST:  1030mISOVUE-300 IOPAMIDOL (ISOVUE-300) INJECTION 61% COMPARISON:  10/05/2016. FINDINGS: CT CHEST FINDINGS Cardiovascular: The heart is normal in size. Trace pericardial fluid posteriorly (series 2/image 30). Coronary atherosclerosis of the LAD and right coronary artery. No evidence of thoracic aortic aneurysm. Left chest port terminates the cavoatrial junction. Mediastinum/Nodes: Small mediastinal lymph nodes, including an 8 mm short axis low right paratracheal node and a 10 mm short axis AP window node (series 2/ image 16). No suspicious hilar or axillary lymphadenopathy. Mild wall thickening along the mid/ distal esophagus (series 2/ image 21). Visualized thyroid is unremarkable. Lungs/Pleura: Radiation changes in the medial right upper lobe/ paramediastinal region (series 6/image 28). Faint adjacent ground-glass nodularity  in the right upper lobe measuring up to 8 mm (series 6/image 23), nonspecific. Radiation changes in the left lower lobe (series 6/image 53). Adjacent discrete ground-glass nodule in the left lower lobe measuring up to 1.6 cm (series 6/ image 44), unchanged, without definite solid component. No pleural effusion or pneumothorax. Musculoskeletal: Status post right mastectomy with reconstruction. Degenerative changes of the thoracic spine. CT ABDOMEN PELVIS FINDINGS Hepatobiliary: Scattered hepatic cysts, measuring up to 3.5 cm in segment 4B (series 2/image 50). Additional 3.4 cm atypical enhancing lesion in segment 6 (series 2/image 50),. No new/ suspicious hepatic lesions. Status post cholecystectomy. No intrahepatic or extrahepatic ductal dilatation.  Pancreas: Within normal limits. Spleen: Within normal limits. Adrenals/Urinary Tract: Adrenal glands are within normal limits. Kidneys are within normal limits.  No hydronephrosis. Bladder is within normal limits. Stomach/Bowel: Stomach is notable for a small hiatal hernia. No evidence of bowel obstruction. Duodenal diverticulum (series 2/image 60). Normal appendix (series 2/ image 95). Mild left colonic diverticulosis, without evidence of diverticulitis. Vascular/Lymphatic: No evidence of abdominal aortic aneurysm. Atherosclerotic calcifications of the abdominal aorta and branch vessels. No suspicious abdominopelvic lymphadenopathy. Reproductive: Status post hysterectomy. No adnexal masses. Other: No abdominopelvic ascites. Musculoskeletal: Grade 1 anterolisthesis of L4 on L5. IMPRESSION: Radiation changes in the right upper lobe/paramediastinal region and left lower lobe. Small mediastinal lymph nodes measuring up to 10 mm short axis, stable versus mildly decreased. Stable 16 mm ground-glass nodule in the left lower lobe. Annual surveillance is suggested. No evidence of metastatic disease in the abdomen/pelvis. Electronically Signed   By: Julian Hy M.D.   On: 12/22/2016 09:52   Mm Screening Breast Tomo Uni L  Result Date: 12/16/2016 CLINICAL DATA:  Screening. EXAM: 2D DIGITAL SCREENING UNILATERAL LEFT MAMMOGRAM WITH CAD AND ADJUNCT TOMO COMPARISON:  Previous exam(s). ACR Breast Density Category c: The breast tissue is heterogeneously dense, which may obscure small masses. FINDINGS: The patient has had a right mastectomy. There are no findings suspicious for malignancy. Images were processed with CAD. IMPRESSION: No mammographic evidence of malignancy. A result letter of this screening mammogram will be mailed directly to the patient. RECOMMENDATION: Screening mammogram in one year.  (Code:SM-L-44M) BI-RADS CATEGORY  1: Negative. Electronically Signed   By: Trude Mcburney M.D.   On: 12/16/2016 13:39      ASSESSMENT AND PLAN:  This is a very pleasant 76 years old white female with metastatic non-small cell lung cancer, adenocarcinoma with metastasis to the brain and adrenal gland. She underwent initial course of concurrent chemoradiation to the locally advanced disease in the chest as well as a stereotactic radiotherapy to the solitary adrenal lesions before she developed multiple brain metastases and she was treated with stereotactic radiotherapy to the brain lesions. The patient was treated with systemic chemotherapy with carboplatin, Alimta and Avastin status post 6 cycles. Avastin was discontinued at cycle #5 secondary to hypersensitivity reaction. The patient had repeat CT scan of the chest, abdomen and pelvis performed recently. I personally and independently reviewed the scan images and discuss result with the patient and her daughter today. Her scan showed no evidence for disease progression. I discussed with the patient her treatment options and goals of care. I gave her the option of continuous observation and close monitoring versus consideration of palliative maintenance treatment with single agent Alimta. The patient is interested in maintenance treatment and she will receive cycle #1 on 01/04/2017. For the history of pulmonary embolism and deep venous thrombosis, she will continue her current treatment with Lovenox.  For the swelling of the lower extremities, she will continue on Lasix on as-needed basis. The patient will come back for follow-up visit in around 4 weeks with the start of cycle #2. She was advised to call immediately if she has any concerning symptoms in the interval. The patient voices understanding of current disease status and treatment options and is in agreement with the current care plan. All questions were answered. The patient knows to call the clinic with any problems, questions or concerns. We can certainly see the patient much sooner if  necessary.  Disclaimer: This note was dictated with voice recognition software. Similar sounding words can inadvertently be transcribed and may not be corrected upon review.

## 2016-12-24 NOTE — Progress Notes (Signed)
DISCONTINUE ON PATHWAY REGIMEN - Non-Small Cell Lung     A cycle is every 21 days:     Carboplatin        Dose Mod: None     Pemetrexed        Dose Mod: None     Bevacizumab        Dose Mod: None  **Always confirm dose/schedule in your pharmacy ordering system**    REASON: Other Reason PRIOR TREATMENT: [id number]: Carboplatin AUC=5 + Pemetrexed 500 mg/m2 + Bevacizumab 15 mg/kg q21 Days x 4 Cycles TREATMENT RESPONSE: Stable Disease (SD)  START ON PATHWAY REGIMEN - Non-Small Cell Lung     A cycle is every 21 days:     Pemetrexed   **Always confirm dose/schedule in your pharmacy ordering system**    Patient Characteristics: Stage IV Metastatic, Non Squamous, Maintenance - Chemotherapy/Immunotherapy, PS = 0, 1, Initial Pemetrexed and Platinum Agent AJCC T Category: T2a Current Disease Status: Distant Metastases AJCC N Category: N3 AJCC M Category: M1c AJCC 8 Stage Grouping: IVB Histology: Non Squamous Cell ROS1 Rearrangement Status: Negative T790M Mutation Status: Not Applicable - EGFR Mutation Negative/Unknown Other Mutations/Biomarkers: No Other Actionable Mutations PD-L1 Expression Status: Quantity Not Sufficient Chemotherapy/Immunotherapy LOT: Maintenance Chemotherapy/Immunotherapy Molecular Targeted Therapy: Not Appropriate ALK Translocation Status: Negative Would you be surprised if this patient died  in the next year? I would NOT be surprised if this patient died in the next year EGFR Mutation Status: Negative/Wild Type BRAF V600E Mutation Status: Negative Performance Status: PS = 0, 1  Intent of Therapy: Non-Curative / Palliative Intent, Discussed with Patient

## 2016-12-25 ENCOUNTER — Telehealth: Payer: Self-pay | Admitting: Internal Medicine

## 2016-12-25 NOTE — Telephone Encounter (Signed)
Scheduled appt per 5/31 LOS - patient is aware of appts.

## 2017-01-04 ENCOUNTER — Ambulatory Visit (HOSPITAL_BASED_OUTPATIENT_CLINIC_OR_DEPARTMENT_OTHER): Payer: Medicare Other

## 2017-01-04 ENCOUNTER — Other Ambulatory Visit (HOSPITAL_BASED_OUTPATIENT_CLINIC_OR_DEPARTMENT_OTHER): Payer: Medicare Other

## 2017-01-04 VITALS — BP 140/78 | HR 85 | Temp 97.6°F | Resp 18

## 2017-01-04 DIAGNOSIS — C7972 Secondary malignant neoplasm of left adrenal gland: Secondary | ICD-10-CM

## 2017-01-04 DIAGNOSIS — C3491 Malignant neoplasm of unspecified part of right bronchus or lung: Secondary | ICD-10-CM

## 2017-01-04 DIAGNOSIS — C801 Malignant (primary) neoplasm, unspecified: Principal | ICD-10-CM

## 2017-01-04 DIAGNOSIS — Z5111 Encounter for antineoplastic chemotherapy: Secondary | ICD-10-CM

## 2017-01-04 LAB — CBC WITH DIFFERENTIAL/PLATELET
BASO%: 0.4 % (ref 0.0–2.0)
BASOS ABS: 0 10*3/uL (ref 0.0–0.1)
EOS ABS: 0 10*3/uL (ref 0.0–0.5)
EOS%: 0.2 % (ref 0.0–7.0)
HEMATOCRIT: 36.9 % (ref 34.8–46.6)
HGB: 12.6 g/dL (ref 11.6–15.9)
LYMPH#: 0.5 10*3/uL — AB (ref 0.9–3.3)
LYMPH%: 7.9 % — ABNORMAL LOW (ref 14.0–49.7)
MCH: 31.9 pg (ref 25.1–34.0)
MCHC: 34 g/dL (ref 31.5–36.0)
MCV: 93.6 fL (ref 79.5–101.0)
MONO#: 0.5 10*3/uL (ref 0.1–0.9)
MONO%: 8.3 % (ref 0.0–14.0)
NEUT#: 4.9 10*3/uL (ref 1.5–6.5)
NEUT%: 83.2 % — ABNORMAL HIGH (ref 38.4–76.8)
Platelets: 169 10*3/uL (ref 145–400)
RBC: 3.95 10*6/uL (ref 3.70–5.45)
RDW: 16.8 % — AB (ref 11.2–14.5)
WBC: 5.9 10*3/uL (ref 3.9–10.3)

## 2017-01-04 LAB — COMPREHENSIVE METABOLIC PANEL
ALBUMIN: 3.8 g/dL (ref 3.5–5.0)
ALK PHOS: 37 U/L — AB (ref 40–150)
ALT: 19 U/L (ref 0–55)
AST: 28 U/L (ref 5–34)
Anion Gap: 10 mEq/L (ref 3–11)
BILIRUBIN TOTAL: 0.93 mg/dL (ref 0.20–1.20)
BUN: 20 mg/dL (ref 7.0–26.0)
CALCIUM: 10.7 mg/dL — AB (ref 8.4–10.4)
CO2: 25 mEq/L (ref 22–29)
CREATININE: 0.7 mg/dL (ref 0.6–1.1)
Chloride: 106 mEq/L (ref 98–109)
EGFR: 81 mL/min/{1.73_m2} — ABNORMAL LOW (ref >90)
Glucose: 100 mg/dl (ref 70–140)
Potassium: 4.2 mEq/L (ref 3.5–5.1)
Sodium: 141 mEq/L (ref 136–145)
Total Protein: 6.6 g/dL (ref 6.4–8.3)

## 2017-01-04 LAB — UA PROTEIN, DIPSTICK - CHCC: Protein, ur: NEGATIVE mg/dL

## 2017-01-04 MED ORDER — CYANOCOBALAMIN 1000 MCG/ML IJ SOLN
INTRAMUSCULAR | Status: AC
  Filled 2017-01-04: qty 1

## 2017-01-04 MED ORDER — PROCHLORPERAZINE MALEATE 10 MG PO TABS
10.0000 mg | ORAL_TABLET | Freq: Once | ORAL | Status: AC
  Administered 2017-01-04: 10 mg via ORAL

## 2017-01-04 MED ORDER — SODIUM CHLORIDE 0.9% FLUSH
10.0000 mL | INTRAVENOUS | Status: DC | PRN
  Administered 2017-01-04: 10 mL
  Filled 2017-01-04: qty 10

## 2017-01-04 MED ORDER — PROCHLORPERAZINE MALEATE 10 MG PO TABS
ORAL_TABLET | ORAL | Status: AC
  Filled 2017-01-04: qty 1

## 2017-01-04 MED ORDER — CYANOCOBALAMIN 1000 MCG/ML IJ SOLN
1000.0000 ug | Freq: Once | INTRAMUSCULAR | Status: AC
  Administered 2017-01-04: 1000 ug via INTRAMUSCULAR

## 2017-01-04 MED ORDER — HEPARIN SOD (PORK) LOCK FLUSH 100 UNIT/ML IV SOLN
500.0000 [IU] | Freq: Once | INTRAVENOUS | Status: AC | PRN
  Administered 2017-01-04: 500 [IU]
  Filled 2017-01-04: qty 5

## 2017-01-04 MED ORDER — SODIUM CHLORIDE 0.9 % IV SOLN
500.0000 mg/m2 | Freq: Once | INTRAVENOUS | Status: AC
  Administered 2017-01-04: 900 mg via INTRAVENOUS
  Filled 2017-01-04: qty 20

## 2017-01-04 MED ORDER — SODIUM CHLORIDE 0.9 % IV SOLN
Freq: Once | INTRAVENOUS | Status: AC
  Administered 2017-01-04: 14:00:00 via INTRAVENOUS

## 2017-01-04 NOTE — Patient Instructions (Addendum)
[facility] Discharge Instructions for Patients Receiving Chemotherapy  Today you received the following chemotherapy agents [place]   To help prevent nausea and vomiting after your treatment, we encourage you to take your nausea medication as directed.    If you develop nausea and vomiting that is not controlled by your nausea medication, call the clinic.   BELOW ARE SYMPTOMS THAT SHOULD BE REPORTED IMMEDIATELY:  *FEVER GREATER THAN 100.5 F  *CHILLS WITH OR WITHOUT FEVER  NAUSEA AND VOMITING THAT IS NOT CONTROLLED WITH YOUR NAUSEA MEDICATION  *UNUSUAL SHORTNESS OF BREATH  *UNUSUAL BRUISING OR BLEEDING  TENDERNESS IN MOUTH AND THROAT WITH OR WITHOUT PRESENCE OF ULCERS  *URINARY PROBLEMS  *BOWEL PROBLEMS  UNUSUAL RASH Items with * indicate a potential emergency and should be followed up as soon as possible.  Feel free to call the clinic you have any questions or concerns. The clinic phone number is [phone].  Please show the CHEMO ALERT CARD at check-in to the Emergency Department and triage nurse.  Cyanocobalamin, Vitamin B12 injection What is this medicine? CYANOCOBALAMIN (sye an oh koe BAL a min) is a man made form of vitamin B12. Vitamin B12 is used in the growth of healthy blood cells, nerve cells, and proteins in the body. It also helps with the metabolism of fats and carbohydrates. This medicine is used to treat people who can not absorb vitamin B12. This medicine may be used for other purposes; ask your health care provider or pharmacist if you have questions. COMMON BRAND NAME(S): B-12 Compliance Kit, B-12 Injection Kit, Cyomin, LA-12, Nutri-Twelve, Physicians EZ Use B-12, Primabalt What should I tell my health care provider before I take this medicine? They need to know if you have any of these conditions: -kidney disease -Leber's disease -megaloblastic anemia -an unusual or allergic reaction to cyanocobalamin, cobalt, other medicines, foods,  dyes, or preservatives -pregnant or trying to get pregnant -breast-feeding How should I use this medicine? This medicine is injected into a muscle or deeply under the skin. It is usually given by a health care professional in a clinic or doctor's office. However, your doctor may teach you how to inject yourself. Follow all instructions. Talk to your pediatrician regarding the use of this medicine in children. Special care may be needed. Overdosage: If you think you have taken too much of this medicine contact a poison control center or emergency room at once. NOTE: This medicine is only for you. Do not share this medicine with others. What if I miss a dose? If you are given your dose at a clinic or doctor's office, call to reschedule your appointment. If you give your own injections and you miss a dose, take it as soon as you can. If it is almost time for your next dose, take only that dose. Do not take double or extra doses. What may interact with this medicine? -colchicine -heavy alcohol intake This list may not describe all possible interactions. Give your health care provider a list of all the medicines, herbs, non-prescription drugs, or dietary supplements you use. Also tell them if you smoke, drink alcohol, or use illegal drugs. Some items may interact with your medicine. What should I watch for while using this medicine? Visit your doctor or health care professional regularly. You may need blood work done while you are taking this medicine. You may need to follow a special diet. Talk to your doctor. Limit your alcohol intake and avoid smoking to get the best benefit.   effects may I notice from receiving this medicine? Side effects that you should report to your doctor or health care professional as soon as possible: -allergic reactions like skin rash, itching or hives, swelling of the face, lips, or tongue -blue tint to skin -chest tightness, pain -difficulty breathing,  wheezing -dizziness -red, swollen painful area on the leg Side effects that usually do not require medical attention (report to your doctor or health care professional if they continue or are bothersome): -diarrhea -headache This list may not describe all possible side effects. Call your doctor for medical advice about side effects. You may report side effects to FDA at 1-800-FDA-1088. Where should I keep my medicine? Keep out of the reach of children. Store at room temperature between 15 and 30 degrees C (59 and 85 degrees F). Protect from light. Throw away any unused medicine after the expiration date. NOTE: This sheet is a summary. It may not cover all possible information. If you have questions about this medicine, talk to your doctor, pharmacist, or health care provider.  2018 Elsevier/Gold Standard (2007-10-24 22:10:20)

## 2017-01-06 ENCOUNTER — Ambulatory Visit: Payer: Medicare Other | Admitting: Neurology

## 2017-01-10 ENCOUNTER — Encounter (HOSPITAL_COMMUNITY): Payer: Self-pay | Admitting: Emergency Medicine

## 2017-01-10 ENCOUNTER — Inpatient Hospital Stay (HOSPITAL_COMMUNITY)
Admission: EM | Admit: 2017-01-10 | Discharge: 2017-01-12 | DRG: 872 | Disposition: A | Discharge status: Home / Self Care | Payer: Medicare Other | Attending: Internal Medicine | Admitting: Internal Medicine

## 2017-01-10 ENCOUNTER — Other Ambulatory Visit: Payer: Self-pay

## 2017-01-10 ENCOUNTER — Emergency Department (HOSPITAL_COMMUNITY): Payer: Medicare Other

## 2017-01-10 DIAGNOSIS — Z87891 Personal history of nicotine dependence: Secondary | ICD-10-CM | POA: Diagnosis not present

## 2017-01-10 DIAGNOSIS — Z7982 Long term (current) use of aspirin: Secondary | ICD-10-CM

## 2017-01-10 DIAGNOSIS — Z79891 Long term (current) use of opiate analgesic: Secondary | ICD-10-CM

## 2017-01-10 DIAGNOSIS — Z85828 Personal history of other malignant neoplasm of skin: Secondary | ICD-10-CM

## 2017-01-10 DIAGNOSIS — Z90710 Acquired absence of both cervix and uterus: Secondary | ICD-10-CM

## 2017-01-10 DIAGNOSIS — G40909 Epilepsy, unspecified, not intractable, without status epilepticus: Secondary | ICD-10-CM | POA: Diagnosis present

## 2017-01-10 DIAGNOSIS — C3491 Malignant neoplasm of unspecified part of right bronchus or lung: Secondary | ICD-10-CM | POA: Diagnosis present

## 2017-01-10 DIAGNOSIS — C7931 Secondary malignant neoplasm of brain: Secondary | ICD-10-CM | POA: Diagnosis not present

## 2017-01-10 DIAGNOSIS — Z86718 Personal history of other venous thrombosis and embolism: Secondary | ICD-10-CM

## 2017-01-10 DIAGNOSIS — Z85118 Personal history of other malignant neoplasm of bronchus and lung: Secondary | ICD-10-CM

## 2017-01-10 DIAGNOSIS — J029 Acute pharyngitis, unspecified: Secondary | ICD-10-CM | POA: Diagnosis present

## 2017-01-10 DIAGNOSIS — D709 Neutropenia, unspecified: Secondary | ICD-10-CM | POA: Diagnosis present

## 2017-01-10 DIAGNOSIS — E860 Dehydration: Secondary | ICD-10-CM | POA: Diagnosis present

## 2017-01-10 DIAGNOSIS — G629 Polyneuropathy, unspecified: Secondary | ICD-10-CM | POA: Diagnosis present

## 2017-01-10 DIAGNOSIS — Z79899 Other long term (current) drug therapy: Secondary | ICD-10-CM

## 2017-01-10 DIAGNOSIS — Z8711 Personal history of peptic ulcer disease: Secondary | ICD-10-CM | POA: Diagnosis not present

## 2017-01-10 DIAGNOSIS — K219 Gastro-esophageal reflux disease without esophagitis: Secondary | ICD-10-CM | POA: Diagnosis present

## 2017-01-10 DIAGNOSIS — G4733 Obstructive sleep apnea (adult) (pediatric): Secondary | ICD-10-CM | POA: Diagnosis present

## 2017-01-10 DIAGNOSIS — C801 Malignant (primary) neoplasm, unspecified: Secondary | ICD-10-CM | POA: Diagnosis not present

## 2017-01-10 DIAGNOSIS — D701 Agranulocytosis secondary to cancer chemotherapy: Secondary | ICD-10-CM | POA: Diagnosis not present

## 2017-01-10 DIAGNOSIS — N3001 Acute cystitis with hematuria: Secondary | ICD-10-CM | POA: Diagnosis not present

## 2017-01-10 DIAGNOSIS — Z9011 Acquired absence of right breast and nipple: Secondary | ICD-10-CM

## 2017-01-10 DIAGNOSIS — Y842 Radiological procedure and radiotherapy as the cause of abnormal reaction of the patient, or of later complication, without mention of misadventure at the time of the procedure: Secondary | ICD-10-CM | POA: Diagnosis present

## 2017-01-10 DIAGNOSIS — C7972 Secondary malignant neoplasm of left adrenal gland: Secondary | ICD-10-CM | POA: Diagnosis present

## 2017-01-10 DIAGNOSIS — T451X5A Adverse effect of antineoplastic and immunosuppressive drugs, initial encounter: Secondary | ICD-10-CM | POA: Diagnosis not present

## 2017-01-10 DIAGNOSIS — Z171 Estrogen receptor negative status [ER-]: Secondary | ICD-10-CM

## 2017-01-10 DIAGNOSIS — D61818 Other pancytopenia: Secondary | ICD-10-CM | POA: Diagnosis present

## 2017-01-10 DIAGNOSIS — Z7901 Long term (current) use of anticoagulants: Secondary | ICD-10-CM

## 2017-01-10 DIAGNOSIS — N390 Urinary tract infection, site not specified: Secondary | ICD-10-CM | POA: Diagnosis not present

## 2017-01-10 DIAGNOSIS — I452 Bifascicular block: Secondary | ICD-10-CM | POA: Diagnosis present

## 2017-01-10 DIAGNOSIS — R5081 Fever presenting with conditions classified elsewhere: Secondary | ICD-10-CM | POA: Diagnosis not present

## 2017-01-10 DIAGNOSIS — I82402 Acute embolism and thrombosis of unspecified deep veins of left lower extremity: Secondary | ICD-10-CM | POA: Diagnosis present

## 2017-01-10 DIAGNOSIS — Z9989 Dependence on other enabling machines and devices: Secondary | ICD-10-CM

## 2017-01-10 DIAGNOSIS — A419 Sepsis, unspecified organism: Secondary | ICD-10-CM | POA: Diagnosis not present

## 2017-01-10 DIAGNOSIS — N3000 Acute cystitis without hematuria: Secondary | ICD-10-CM

## 2017-01-10 DIAGNOSIS — J700 Acute pulmonary manifestations due to radiation: Secondary | ICD-10-CM | POA: Diagnosis present

## 2017-01-10 DIAGNOSIS — Z923 Personal history of irradiation: Secondary | ICD-10-CM

## 2017-01-10 DIAGNOSIS — R6510 Systemic inflammatory response syndrome (SIRS) of non-infectious origin without acute organ dysfunction: Secondary | ICD-10-CM | POA: Diagnosis not present

## 2017-01-10 DIAGNOSIS — Z87440 Personal history of urinary (tract) infections: Secondary | ICD-10-CM

## 2017-01-10 DIAGNOSIS — R5381 Other malaise: Secondary | ICD-10-CM | POA: Diagnosis present

## 2017-01-10 LAB — BASIC METABOLIC PANEL
ANION GAP: 8 (ref 5–15)
BUN: 22 mg/dL — AB (ref 6–20)
CALCIUM: 8.9 mg/dL (ref 8.9–10.3)
CO2: 23 mmol/L (ref 22–32)
CREATININE: 0.64 mg/dL (ref 0.44–1.00)
Chloride: 106 mmol/L (ref 101–111)
GFR calc Af Amer: >60 mL/min (ref >60)
GLUCOSE: 101 mg/dL — AB (ref 65–99)
Potassium: 4 mmol/L (ref 3.5–5.1)
Sodium: 137 mmol/L (ref 135–145)

## 2017-01-10 LAB — POCT I-STAT TROPONIN I: TROPONIN I, POC: 0.01 ng/mL (ref 0.00–0.08)

## 2017-01-10 LAB — URINALYSIS, ROUTINE W REFLEX MICROSCOPIC
BILIRUBIN URINE: NEGATIVE
Glucose, UA: NEGATIVE mg/dL
KETONES UR: NEGATIVE mg/dL
Nitrite: POSITIVE — AB
PH: 5.5 (ref 5.0–8.0)
Specific Gravity, Urine: 1.025 (ref 1.005–1.030)

## 2017-01-10 LAB — CBC WITH DIFFERENTIAL/PLATELET
BASOS ABS: 0 10*3/uL (ref 0.0–0.1)
BASOS PCT: 2 %
EOS ABS: 0.1 10*3/uL (ref 0.0–0.7)
EOS PCT: 2 %
HEMATOCRIT: 26.7 % — AB (ref 36.0–46.0)
Hemoglobin: 9.3 g/dL — ABNORMAL LOW (ref 12.0–15.0)
Lymphocytes Relative: 17 %
Lymphs Abs: 0.4 10*3/uL — ABNORMAL LOW (ref 0.7–4.0)
MCH: 31.4 pg (ref 26.0–34.0)
MCHC: 34.8 g/dL (ref 30.0–36.0)
MCV: 90.2 fL (ref 78.0–100.0)
MONO ABS: 0.6 10*3/uL (ref 0.1–1.0)
MONOS PCT: 24 %
NEUTROS ABS: 1.4 10*3/uL — AB (ref 1.7–7.7)
Neutrophils Relative %: 55 %
PLATELETS: 149 10*3/uL — AB (ref 150–400)
RBC: 2.96 MIL/uL — ABNORMAL LOW (ref 3.87–5.11)
RDW: 15.5 % (ref 11.5–15.5)
WBC: 2.5 10*3/uL — ABNORMAL LOW (ref 4.0–10.5)

## 2017-01-10 LAB — URINALYSIS, MICROSCOPIC (REFLEX)

## 2017-01-10 LAB — HEPATIC FUNCTION PANEL
ALT: 32 U/L (ref 14–54)
AST: 39 U/L (ref 15–41)
Albumin: 3.3 g/dL — ABNORMAL LOW (ref 3.5–5.0)
Alkaline Phosphatase: 29 U/L — ABNORMAL LOW (ref 38–126)
BILIRUBIN TOTAL: 0.8 mg/dL (ref 0.3–1.2)
Bilirubin, Direct: 0.2 mg/dL (ref 0.1–0.5)
Indirect Bilirubin: 0.6 mg/dL (ref 0.3–0.9)
Total Protein: 5.8 g/dL — ABNORMAL LOW (ref 6.5–8.1)

## 2017-01-10 LAB — CG4 I-STAT (LACTIC ACID): LACTIC ACID, VENOUS: 1.41 mmol/L (ref 0.5–1.9)

## 2017-01-10 MED ORDER — MAGIC MOUTHWASH
15.0000 mL | Freq: Four times a day (QID) | ORAL | Status: DC
  Filled 2017-01-10 (×7): qty 15

## 2017-01-10 MED ORDER — SODIUM CHLORIDE 0.9 % IV SOLN
INTRAVENOUS | Status: AC
  Administered 2017-01-10: via INTRAVENOUS

## 2017-01-10 MED ORDER — ENOXAPARIN SODIUM 120 MG/0.8ML ~~LOC~~ SOLN
115.0000 mg | Freq: Every day | SUBCUTANEOUS | Status: DC
  Administered 2017-01-10 – 2017-01-11 (×2): 115 mg via SUBCUTANEOUS
  Filled 2017-01-10 (×2): qty 0.77

## 2017-01-10 MED ORDER — POLYETHYLENE GLYCOL 3350 17 G PO PACK
17.0000 g | PACK | Freq: Every day | ORAL | Status: DC
  Administered 2017-01-11 – 2017-01-12 (×2): 17 g via ORAL
  Filled 2017-01-10 (×2): qty 1

## 2017-01-10 MED ORDER — CEFEPIME HCL 2 G IJ SOLR
2.0000 g | Freq: Once | INTRAMUSCULAR | Status: DC
  Filled 2017-01-10: qty 2

## 2017-01-10 MED ORDER — ACETAMINOPHEN 500 MG PO TABS
1000.0000 mg | ORAL_TABLET | Freq: Once | ORAL | Status: AC
  Administered 2017-01-10: 1000 mg via ORAL
  Filled 2017-01-10: qty 2

## 2017-01-10 MED ORDER — LACOSAMIDE 50 MG PO TABS
150.0000 mg | ORAL_TABLET | Freq: Two times a day (BID) | ORAL | Status: DC
  Administered 2017-01-10 – 2017-01-12 (×4): 150 mg via ORAL
  Filled 2017-01-10 (×4): qty 3

## 2017-01-10 MED ORDER — LIDOCAINE-PRILOCAINE 2.5-2.5 % EX CREA: 1.0000 "application " | TOPICAL_CREAM | Freq: Two times a day (BID) | CUTANEOUS | Status: DC | PRN

## 2017-01-10 MED ORDER — FOLIC ACID 1 MG PO TABS
1.0000 mg | ORAL_TABLET | Freq: Every day | ORAL | Status: DC
  Administered 2017-01-11 – 2017-01-12 (×2): 1 mg via ORAL
  Filled 2017-01-10 (×2): qty 1

## 2017-01-10 MED ORDER — VANCOMYCIN HCL IN DEXTROSE 1-5 GM/200ML-% IV SOLN
1000.0000 mg | Freq: Once | INTRAVENOUS | Status: AC
  Administered 2017-01-10: 1000 mg via INTRAVENOUS
  Filled 2017-01-10: qty 200

## 2017-01-10 MED ORDER — PANTOPRAZOLE SODIUM 40 MG PO TBEC
40.0000 mg | DELAYED_RELEASE_TABLET | Freq: Every day | ORAL | Status: DC
  Administered 2017-01-10 – 2017-01-12 (×3): 40 mg via ORAL
  Filled 2017-01-10 (×3): qty 1

## 2017-01-10 MED ORDER — SUCRALFATE 1 G PO TABS
1.0000 g | ORAL_TABLET | Freq: Four times a day (QID) | ORAL | Status: DC
  Administered 2017-01-10 – 2017-01-12 (×5): 1 g via ORAL
  Filled 2017-01-10 (×5): qty 1

## 2017-01-10 MED ORDER — HYDROCODONE-ACETAMINOPHEN 5-325 MG PO TABS
1.0000 | ORAL_TABLET | ORAL | Status: DC | PRN
  Administered 2017-01-11: 2 via ORAL
  Filled 2017-01-10: qty 2

## 2017-01-10 MED ORDER — ASPIRIN EC 81 MG PO TBEC
81.0000 mg | DELAYED_RELEASE_TABLET | Freq: Every day | ORAL | Status: DC
  Administered 2017-01-11 – 2017-01-12 (×2): 81 mg via ORAL
  Filled 2017-01-10 (×2): qty 1

## 2017-01-10 MED ORDER — VITAMIN D 1000 UNITS PO TABS
3000.0000 [IU] | ORAL_TABLET | Freq: Every day | ORAL | Status: DC
  Administered 2017-01-11 – 2017-01-12 (×2): 3000 [IU] via ORAL
  Filled 2017-01-10 (×2): qty 3

## 2017-01-10 MED ORDER — ONDANSETRON HCL 4 MG/2ML IJ SOLN
4.0000 mg | Freq: Four times a day (QID) | INTRAMUSCULAR | Status: DC | PRN
  Administered 2017-01-11: 4 mg via INTRAVENOUS
  Filled 2017-01-10: qty 2

## 2017-01-10 MED ORDER — VITAMIN B-12 1000 MCG PO TABS
2500.0000 ug | ORAL_TABLET | Freq: Every day | ORAL | Status: DC
  Administered 2017-01-11 – 2017-01-12 (×2): 2500 ug via ORAL
  Filled 2017-01-10 (×2): qty 3

## 2017-01-10 MED ORDER — ENSURE ENLIVE PO LIQD: 237.0000 mL | Freq: Every morning | ORAL | Status: DC

## 2017-01-10 MED ORDER — CYCLOBENZAPRINE HCL 10 MG PO TABS: 5.0000 mg | ORAL_TABLET | Freq: Three times a day (TID) | ORAL | Status: DC | PRN

## 2017-01-10 MED ORDER — ACETAMINOPHEN 650 MG RE SUPP: 650.0000 mg | Freq: Four times a day (QID) | RECTAL | Status: DC | PRN

## 2017-01-10 MED ORDER — ALPRAZOLAM 0.5 MG PO TABS: 0.5000 mg | ORAL_TABLET | Freq: Three times a day (TID) | ORAL | Status: DC | PRN

## 2017-01-10 MED ORDER — CALCIUM CARBONATE-VITAMIN D 500-200 MG-UNIT PO TABS
1.0000 | ORAL_TABLET | Freq: Every day | ORAL | Status: DC
  Administered 2017-01-10 – 2017-01-12 (×3): 1 via ORAL
  Filled 2017-01-10 (×3): qty 1

## 2017-01-10 MED ORDER — PIPERACILLIN-TAZOBACTAM 3.375 G IVPB 30 MIN
3.3750 g | Freq: Once | INTRAVENOUS | Status: AC
  Administered 2017-01-10: 3.375 g via INTRAVENOUS
  Filled 2017-01-10: qty 50

## 2017-01-10 MED ORDER — RISAQUAD PO CAPS
1.0000 | ORAL_CAPSULE | Freq: Every day | ORAL | Status: DC
  Administered 2017-01-11 – 2017-01-12 (×2): 1 via ORAL
  Filled 2017-01-10 (×2): qty 1

## 2017-01-10 MED ORDER — OMEGA-3-ACID ETHYL ESTERS 1 G PO CAPS
1.0000 g | ORAL_CAPSULE | Freq: Every day | ORAL | Status: DC
  Administered 2017-01-11 – 2017-01-12 (×2): 1 g via ORAL
  Filled 2017-01-10 (×2): qty 1

## 2017-01-10 MED ORDER — SODIUM CHLORIDE 0.9 % IV BOLUS (SEPSIS)
1000.0000 mL | Freq: Once | INTRAVENOUS | Status: AC
  Administered 2017-01-10: 1000 mL via INTRAVENOUS

## 2017-01-10 MED ORDER — ACETAMINOPHEN 325 MG PO TABS: 650.0000 mg | ORAL_TABLET | Freq: Four times a day (QID) | ORAL | Status: DC | PRN

## 2017-01-10 MED ORDER — DEXTROSE 5 % IV SOLN
2.0000 g | Freq: Two times a day (BID) | INTRAVENOUS | Status: DC
  Administered 2017-01-10 – 2017-01-12 (×4): 2 g via INTRAVENOUS
  Filled 2017-01-10 (×5): qty 2

## 2017-01-10 MED ORDER — ONDANSETRON HCL 4 MG PO TABS
4.0000 mg | ORAL_TABLET | Freq: Four times a day (QID) | ORAL | Status: DC | PRN
  Filled 2017-01-10: qty 1

## 2017-01-10 MED ORDER — ADULT MULTIVITAMIN W/MINERALS CH
1.0000 | ORAL_TABLET | Freq: Every day | ORAL | Status: DC
  Administered 2017-01-11 – 2017-01-12 (×2): 1 via ORAL
  Filled 2017-01-10 (×2): qty 1

## 2017-01-10 MED ORDER — PROCHLORPERAZINE MALEATE 10 MG PO TABS
10.0000 mg | ORAL_TABLET | Freq: Four times a day (QID) | ORAL | Status: DC | PRN
  Administered 2017-01-11: 10 mg via ORAL
  Filled 2017-01-10: qty 1
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
## 2017-01-10 NOTE — ED Notes (Signed)
Attempted to collect a second set of blood cultures but was unsuccessful.
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
## 2017-01-10 NOTE — Progress Notes (Signed)
Pharmacy Antibiotic and Anticoagulation note  Molly Black is a 76 y.o. female c/o fever, malaise, lower abdominal discomfort and sore throat admitted on 01/10/2017 with UTI.  Pharmacy has been consulted for cefepime and lovenox dosing.   Plan: Cefepime 2 Gm IV q12h F/u scr/cultures Lovenox 115 mg sq q24h ( 1.5 mg/kg)  Height: 5\' 3"  (160 cm) Weight: 165 lb 2 oz (74.9 kg) IBW/kg (Calculated) : 52.4  Temp (24hrs), Avg:100.2 F (37.9 C), Min:99.2 F (37.3 C), Max:101.1 F (38.4 C)   Recent Labs Lab 01/04/17 1317 01/10/17 1818 01/10/17 1828  WBC 5.9 2.5*  --   CREATININE 0.7 0.64  --   LATICACIDVEN  --   --  1.41    Estimated Creatinine Clearance: 60.7 mL/min (by C-G formula based on SCr of 0.64 mg/dL).    No Known Allergies  Antimicrobials this admission: 6/17 cefepime >>  6/17 vancomycin >> x1 ED  Dose adjustments this admission:   Microbiology results:  BCx:   UCx:    Sputum:    MRSA PCR:   Thank you for allowing pharmacy to be a part of this patient's care.  Dorrene German 01/10/2017 10:06 PM

## 2017-01-10 NOTE — ED Notes (Signed)
Bed: WA03 Expected date:  Expected time:  Means of arrival:  Comments: TR 2

## 2017-01-10 NOTE — Progress Notes (Signed)
Pt refuses cpap tonight stating she is coughing a lot and does not want to wear mask.  Pt was advised that RT is available all night and encouraged her to call, should she change her mind.

## 2017-01-10 NOTE — ED Triage Notes (Addendum)
Pt c/o chest fullness, cough with clear mucus, temperature, abdominal pain, nausea 101 F, chills onset Friday. Started new IV chemotherapy, last infusion Monday.  Right upper lung sounds diminished, crackles throughout.

## 2017-01-10 NOTE — ED Notes (Signed)
One set of blood cultures collected at 18:19.

## 2017-01-11 DIAGNOSIS — C3491 Malignant neoplasm of unspecified part of right bronchus or lung: Secondary | ICD-10-CM

## 2017-01-11 DIAGNOSIS — C7931 Secondary malignant neoplasm of brain: Secondary | ICD-10-CM

## 2017-01-11 DIAGNOSIS — D61818 Other pancytopenia: Secondary | ICD-10-CM

## 2017-01-11 DIAGNOSIS — N3001 Acute cystitis with hematuria: Secondary | ICD-10-CM

## 2017-01-11 DIAGNOSIS — R6510 Systemic inflammatory response syndrome (SIRS) of non-infectious origin without acute organ dysfunction: Principal | ICD-10-CM

## 2017-01-11 DIAGNOSIS — Z9989 Dependence on other enabling machines and devices: Secondary | ICD-10-CM

## 2017-01-11 DIAGNOSIS — T451X5A Adverse effect of antineoplastic and immunosuppressive drugs, initial encounter: Secondary | ICD-10-CM

## 2017-01-11 DIAGNOSIS — D701 Agranulocytosis secondary to cancer chemotherapy: Secondary | ICD-10-CM

## 2017-01-11 DIAGNOSIS — Z86718 Personal history of other venous thrombosis and embolism: Secondary | ICD-10-CM

## 2017-01-11 DIAGNOSIS — G4733 Obstructive sleep apnea (adult) (pediatric): Secondary | ICD-10-CM

## 2017-01-11 LAB — CBC WITH DIFFERENTIAL/PLATELET
BASOS PCT: 1 %
Basophils Absolute: 0 10*3/uL (ref 0.0–0.1)
EOS ABS: 0.1 10*3/uL (ref 0.0–0.7)
Eosinophils Relative: 5 %
HCT: 27.1 % — ABNORMAL LOW (ref 36.0–46.0)
HEMOGLOBIN: 9.6 g/dL — AB (ref 12.0–15.0)
LYMPHS PCT: 23 %
Lymphs Abs: 0.3 10*3/uL — ABNORMAL LOW (ref 0.7–4.0)
MCH: 32.2 pg (ref 26.0–34.0)
MCHC: 35.4 g/dL (ref 30.0–36.0)
MCV: 90.9 fL (ref 78.0–100.0)
MONO ABS: 0.3 10*3/uL (ref 0.1–1.0)
Monocytes Relative: 20 %
Neutro Abs: 0.8 10*3/uL — ABNORMAL LOW (ref 1.7–7.7)
Neutrophils Relative %: 51 %
PLATELETS: 113 10*3/uL — AB (ref 150–400)
RBC: 2.98 MIL/uL — ABNORMAL LOW (ref 3.87–5.11)
RDW: 15.6 % — ABNORMAL HIGH (ref 11.5–15.5)
WBC: 1.5 10*3/uL — ABNORMAL LOW (ref 4.0–10.5)

## 2017-01-11 LAB — BASIC METABOLIC PANEL
ANION GAP: 5 (ref 5–15)
BUN: 18 mg/dL (ref 6–20)
CALCIUM: 8.5 mg/dL — AB (ref 8.9–10.3)
CO2: 24 mmol/L (ref 22–32)
CREATININE: 0.63 mg/dL (ref 0.44–1.00)
Chloride: 111 mmol/L (ref 101–111)
GFR calc non Af Amer: >60 mL/min (ref >60)
Glucose, Bld: 110 mg/dL — ABNORMAL HIGH (ref 65–99)
Potassium: 3.5 mmol/L (ref 3.5–5.1)
Sodium: 140 mmol/L (ref 135–145)

## 2017-01-11 LAB — RESPIRATORY PANEL BY PCR
ADENOVIRUS-RVPPCR: NOT DETECTED
BORDETELLA PERTUSSIS-RVPCR: NOT DETECTED
CHLAMYDOPHILA PNEUMONIAE-RVPPCR: NOT DETECTED
CORONAVIRUS HKU1-RVPPCR: NOT DETECTED
CORONAVIRUS NL63-RVPPCR: NOT DETECTED
Coronavirus 229E: NOT DETECTED
Coronavirus OC43: NOT DETECTED
Influenza A: NOT DETECTED
Influenza B: NOT DETECTED
Metapneumovirus: NOT DETECTED
Mycoplasma pneumoniae: NOT DETECTED
PARAINFLUENZA VIRUS 3-RVPPCR: NOT DETECTED
Parainfluenza Virus 1: NOT DETECTED
Parainfluenza Virus 2: NOT DETECTED
Parainfluenza Virus 4: NOT DETECTED
RHINOVIRUS / ENTEROVIRUS - RVPPCR: NOT DETECTED
Respiratory Syncytial Virus: NOT DETECTED

## 2017-01-11 LAB — EXPECTORATED SPUTUM ASSESSMENT W REFEX TO RESP CULTURE

## 2017-01-11 LAB — EXPECTORATED SPUTUM ASSESSMENT W GRAM STAIN, RFLX TO RESP C

## 2017-01-11 MED ORDER — TBO-FILGRASTIM 300 MCG/0.5ML ~~LOC~~ SOSY
300.0000 ug | PREFILLED_SYRINGE | Freq: Once | SUBCUTANEOUS | Status: AC
  Administered 2017-01-11: 300 ug via SUBCUTANEOUS
  Filled 2017-01-11: qty 0.5

## 2017-01-11 MED ORDER — IPRATROPIUM-ALBUTEROL 0.5-2.5 (3) MG/3ML IN SOLN
3.0000 mL | Freq: Three times a day (TID) | RESPIRATORY_TRACT | Status: DC
  Administered 2017-01-11 – 2017-01-12 (×2): 3 mL via RESPIRATORY_TRACT
  Filled 2017-01-11 (×2): qty 3

## 2017-01-11 MED ORDER — GUAIFENESIN ER 600 MG PO TB12
600.0000 mg | ORAL_TABLET | Freq: Two times a day (BID) | ORAL | Status: DC
  Administered 2017-01-11 – 2017-01-12 (×3): 600 mg via ORAL
  Filled 2017-01-11 (×3): qty 1

## 2017-01-11 MED ORDER — ARFORMOTEROL TARTRATE 15 MCG/2ML IN NEBU
15.0000 ug | INHALATION_SOLUTION | Freq: Two times a day (BID) | RESPIRATORY_TRACT | Status: DC
  Administered 2017-01-11 – 2017-01-12 (×2): 15 ug via RESPIRATORY_TRACT
  Filled 2017-01-11 (×3): qty 2

## 2017-01-11 MED ORDER — BUDESONIDE 0.25 MG/2ML IN SUSP
0.2500 mg | Freq: Two times a day (BID) | RESPIRATORY_TRACT | Status: DC
  Administered 2017-01-11 – 2017-01-12 (×2): 0.25 mg via RESPIRATORY_TRACT
  Filled 2017-01-11 (×2): qty 2

## 2017-01-11 MED ORDER — IPRATROPIUM-ALBUTEROL 0.5-2.5 (3) MG/3ML IN SOLN
3.0000 mL | Freq: Four times a day (QID) | RESPIRATORY_TRACT | Status: DC
  Administered 2017-01-11: 3 mL via RESPIRATORY_TRACT
  Filled 2017-01-11: qty 3

## 2017-01-11 NOTE — Progress Notes (Signed)
PROGRESS NOTE    [name] [facility]   OZD:[id number]  DOB: [birth year]  DOA: 01/10/2017 PCP: [name], MD   Brief Narrative:  Molly Black is a 76 y.o. 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 Noted to have a fever to 101.1. She is noted to have a mildly positive UA but is not having dysuria. She admits to a cough with congestion which, she has told by Dr Julien Nordmann, is due to radiation changes in there lungs. She reports that her sputum is tan in color and not particularly bothersome. Her last chemo was about 1 wk ago and she is currently receiving a cycle every 3 wks.   Subjective: Feels that her weakness has resolved. Her cough is no different than usual and she has no dysuria.  ROS: no diarrhea, dyspnea or pain  Assessment & Plan:   Principal Problem:   SIRS (systemic inflammatory response syndrome)  - fever and leukopenia in setting of recent chemo - cont Maxipime -  Vancomycin given in ER but no need to resume  Active Problems:   Pancytopenia - WBC and platelets count continue to drop - will order Neupogen  Dehydration - has improved with IVF  UTI - UA shows many bacteria, positive nitrites, WBC 6-30- f/u culture  Cough and congestion - due to mild radiation pneumonitis - add nebs including inhaled steroids and Mucinex - Res virus panel negative    Adenocarcinoma of right lung, stage 4    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     Brain metastases  - being treated with radiation and chemo - per patient, the cancer has not progressed with treatment    OSA on CPAP - cont CPAP at bedtime- have encouraged patient to use it tonight   History of DVT in LLE - Lovenox  DVT prophylaxis: Lovenox Code Status: Full  Code Family Communication:  Disposition Plan: home in 24-48 hrs Consultants:    Procedures:    Antimicrobials:  Anti-infectives    Start     Dose/Rate Route Frequency Ordered Stop   01/10/17 2330  ceFEPIme (MAXIPIME) 2 g in dextrose 5 % 50 mL IVPB     2 g 100 mL/hr over 30 Minutes Intravenous Every 12 hours 01/10/17 2317     01/10/17 2145  ceFEPIme (MAXIPIME) 2 g in dextrose 5 % 50 mL IVPB  Status:  Discontinued     2 g 100 mL/hr over 30 Minutes Intravenous  Once 01/10/17 2130 01/10/17 2317   01/10/17 1900  vancomycin (VANCOCIN) IVPB 1000 mg/200 mL premix     1,000 mg 200 mL/hr over 60 Minutes Intravenous  Once 01/10/17 1851 01/10/17 2129   01/10/17 1900  piperacillin-tazobactam (ZOSYN) IVPB 3.375 g     3.375 g 100 mL/hr over 30 Minutes Intravenous  Once 01/10/17 1851 01/10/17 1957       Objective: Vitals:   01/10/17 2009 01/10/17 2100 01/10/17 2246 01/11/17 0512  BP: 104/71 109/60 (!) 105/54 135/68  Pulse: 91 87 87 68  Resp: (!) 21 (!) 24 16 16   Temp:   99.1 F (37.3 C) 97.8 F (36.6 C)  TempSrc:   Oral Oral  SpO2: 100% 99% 99% 97%  Weight:   76.7 kg (169 lb 1.5 oz)  Height:   5\' 3"  (1.6 m)     Intake/Output Summary (Last 24 hours) at 01/11/17 1117 Last data filed at 01/11/17 0956  Gross per 24 hour  Intake              480 ml  Output                0 ml  Net              480 ml   Filed Weights   01/10/17 1714 01/10/17 2246  Weight: 74.9 kg (165 lb 2 oz) 76.7 kg (169 lb 1.5 oz)    Examination: General exam: Appears comfortable  HEENT: PERRLA, oral mucosa moist, no sclera icterus or thrush Respiratory system: Clear to auscultation. Respiratory effort normal. Cardiovascular system: S1 & S2 heard, RRR.  No murmurs  Gastrointestinal system: Abdomen soft, non-tender, nondistended. Normal bowel sound. No organomegaly Central nervous system: Alert and oriented. No focal neurological deficits. Extremities: No cyanosis, clubbing or edema Skin: No rashes or  ulcers Psychiatry:  Mood & affect appropriate.     Data Reviewed: I have personally reviewed following labs and imaging studies  CBC:  Recent Labs Lab 01/04/17 1317 01/10/17 1818 01/11/17 0338  WBC 5.9 2.5* 1.5*  NEUTROABS 4.9 1.4* 0.8*  HGB 12.6 9.3* 9.6*  HCT 36.9 26.7* 27.1*  MCV 93.6 90.2 90.9  PLT 169 149* 258*   Basic Metabolic Panel:  Recent Labs Lab 01/04/17 1317 01/10/17 1818 01/11/17 0338  NA 141 137 140  K 4.2 4.0 3.5  CL  --  106 111  CO2 25 23 24   GLUCOSE 100 101* 110*  BUN 20.0 22* 18  CREATININE 0.7 0.64 0.63  CALCIUM 10.7* 8.9 8.5*   GFR: Estimated Creatinine Clearance: 61.4 mL/min (by C-G formula based on SCr of 0.63 mg/dL). Liver Function Tests:  Recent Labs Lab 01/04/17 1317 01/10/17 1818  AST 28 39  ALT 19 32  ALKPHOS 37* 29*  BILITOT 0.93 0.8  PROT 6.6 5.8*  ALBUMIN 3.8 3.3*   No results for input(s): LIPASE, AMYLASE in the last 168 hours. No results for input(s): AMMONIA in the last 168 hours. Coagulation Profile: No results for input(s): INR, PROTIME in the last 168 hours. Cardiac Enzymes: No results for input(s): CKTOTAL, CKMB, CKMBINDEX, TROPONINI in the last 168 hours. BNP (last 3 results) No results for input(s): PROBNP in the last 8760 hours. HbA1C: No results for input(s): HGBA1C in the last 72 hours. CBG: No results for input(s): GLUCAP in the last 168 hours. Lipid Profile: No results for input(s): CHOL, HDL, LDLCALC, TRIG, CHOLHDL, LDLDIRECT in the last 72 hours. Thyroid Function Tests: No results for input(s): TSH, T4TOTAL, FREET4, T3FREE, THYROIDAB in the last 72 hours. Anemia Panel: No results for input(s): VITAMINB12, FOLATE, FERRITIN, TIBC, IRON, RETICCTPCT in the last 72 hours. Urine analysis:    Component Value Date/Time   COLORURINE YELLOW 01/10/2017 1709   APPEARANCEUR CLOUDY (A) 01/10/2017 1709   LABSPEC 1.025 01/10/2017 1709   LABSPEC 1.010 10/16/2016 1530   PHURINE 5.5 01/10/2017 1709   GLUCOSEU  NEGATIVE 01/10/2017 1709   GLUCOSEU Negative 10/16/2016 1530   HGBUR TRACE (A) 01/10/2017 1709   BILIRUBINUR NEGATIVE 01/10/2017 1709   BILIRUBINUR Negative 10/16/2016 1530   KETONESUR NEGATIVE 01/10/2017 1709   PROTEINUR TRACE (A) 01/10/2017 1709   UROBILINOGEN 0.2 10/16/2016 1530   NITRITE POSITIVE (A) 01/10/2017 1709   LEUKOCYTESUR TRACE (A) 01/10/2017 1709   LEUKOCYTESUR Negative 10/16/2016 1530  Sepsis Labs: @LABRCNTIP (procalcitonin:4,lacticidven:4) ) Recent Results (from the past 240 hour(s))  Culture, blood (Routine x 2)     Status: None (Preliminary result)   Collection Time: 01/10/17  6:18 PM  Result Value Ref Range Status   Specimen Description BLOOD LEFT HAND  Final   Special Requests IN PEDIATRIC BOTTLE Blood Culture adequate volume  Final   Culture PENDING  Incomplete   Report Status PENDING  Incomplete  Respiratory Panel by PCR     Status: None   Collection Time: 01/10/17 11:29 PM  Result Value Ref Range Status   Adenovirus NOT DETECTED NOT DETECTED Final   Coronavirus 229E NOT DETECTED NOT DETECTED Final   Coronavirus HKU1 NOT DETECTED NOT DETECTED Final   Coronavirus NL63 NOT DETECTED NOT DETECTED Final   Coronavirus OC43 NOT DETECTED NOT DETECTED Final   Metapneumovirus NOT DETECTED NOT DETECTED Final   Rhinovirus / Enterovirus NOT DETECTED NOT DETECTED Final   Influenza A NOT DETECTED NOT DETECTED Final   Influenza B NOT DETECTED NOT DETECTED Final   Parainfluenza Virus 1 NOT DETECTED NOT DETECTED Final   Parainfluenza Virus 2 NOT DETECTED NOT DETECTED Final   Parainfluenza Virus 3 NOT DETECTED NOT DETECTED Final   Parainfluenza Virus 4 NOT DETECTED NOT DETECTED Final   Respiratory Syncytial Virus NOT DETECTED NOT DETECTED Final   Bordetella pertussis NOT DETECTED NOT DETECTED Final   Chlamydophila pneumoniae NOT DETECTED NOT DETECTED Final   Mycoplasma pneumoniae NOT DETECTED NOT DETECTED Final    Comment: Performed at [facility], [place]  [address] [place], [place] [address]  Culture, sputum-assessment     Status: None   Collection Time: 01/10/17 11:39 PM  Result Value Ref Range Status   Specimen Description SPU  Final   Special Requests Immunocompromised  Final   Sputum evaluation THIS SPECIMEN IS ACCEPTABLE FOR SPUTUM CULTURE  Final   Report Status 01/11/2017 FINAL  Final         Radiology Studies: Dg Chest 2 View  Result Date: 01/10/2017 CLINICAL DATA:  Initial evaluation for acute cough. History of lung cancer. EXAM: CHEST  2 VIEW COMPARISON:  Prior CT from 12/22/2016. FINDINGS: Left-sided Port-A-Cath in place. Stable cardiomegaly. Mediastinal silhouette normal. Lungs normally inflated. Patchy opacity within the retrocardiac left lower lobe favored to reflect known post radiation changes with bronchiectasis previously seen within this region. Superimposed infection would be difficult to exclude. Known postradiation changes at the medial right upper lobe grossly stable as well. No other focal airspace disease. No pulmonary edema or pleural effusion. No pneumothorax. No acute osseous abnormality. Surgical clips overlie the right axilla. IMPRESSION: 1. Patchy opacity within the retrocardiac left lower lobe, favored to reflect known postradiation changes with bronchiectasis as seen on previous exams. Superimposed infection would be difficult to exclude. 2. No other active cardiopulmonary disease. Electronically Signed   By: Jeannine Boga M.D.   On: 01/10/2017 17:36      Scheduled Meds: . acidophilus  1 capsule Oral Daily  . arformoterol  15 mcg Nebulization BID  . aspirin EC  81 mg Oral Daily  . budesonide (PULMICORT) nebulizer solution  0.25 mg Nebulization BID  . calcium-vitamin D  1 tablet Oral Daily  . cholecalciferol  3,000 Units Oral Daily  . enoxaparin (LOVENOX) injection  115 mg Subcutaneous QHS  . feeding supplement (ENSURE ENLIVE)  237 mL Oral AC breakfast  . folic acid  1 mg Oral Daily  . guaiFENesin  600 mg  Oral BID  .  ipratropium-albuterol  3 mL Nebulization Q6H  . lacosamide  150 mg Oral BID  . magic mouthwash  15 mL Oral QID  . multivitamin with minerals  1 tablet Oral Daily  . omega-3 acid ethyl esters  1 g Oral Daily  . pantoprazole  40 mg Oral Daily  . polyethylene glycol  17 g Oral Daily  . sucralfate  1 g Oral QID  . vitamin B-12  2,500 mcg Oral Daily   Continuous Infusions: . ceFEPime (MAXIPIME) IV Stopped (01/11/17 1052)     LOS: 1 day    Time spent in minutes: 35    Debbe Odea, MD [facility] Pager: [email or web address] Password [facility] 01/11/2017, 11:17 AM

## 2017-01-11 NOTE — Progress Notes (Signed)
Pt refused CPAP qhs.  Pt wishes to sleep without her CPAP while here to save her money.  Education provided to Pt and encouraged to contact RT should she change her mind.

## 2017-01-12 DIAGNOSIS — N390 Urinary tract infection, site not specified: Secondary | ICD-10-CM

## 2017-01-12 DIAGNOSIS — A419 Sepsis, unspecified organism: Secondary | ICD-10-CM

## 2017-01-12 DIAGNOSIS — R5081 Fever presenting with conditions classified elsewhere: Secondary | ICD-10-CM

## 2017-01-12 DIAGNOSIS — D709 Neutropenia, unspecified: Secondary | ICD-10-CM

## 2017-01-12 LAB — CBC
HEMATOCRIT: 29 % — AB (ref 36.0–46.0)
HEMOGLOBIN: 9.9 g/dL — AB (ref 12.0–15.0)
MCH: 31.5 pg (ref 26.0–34.0)
MCHC: 34.1 g/dL (ref 30.0–36.0)
MCV: 92.4 fL (ref 78.0–100.0)
Platelets: 116 10*3/uL — ABNORMAL LOW (ref 150–400)
RBC: 3.14 MIL/uL — AB (ref 3.87–5.11)
RDW: 16 % — ABNORMAL HIGH (ref 11.5–15.5)
WBC: 2.8 10*3/uL — ABNORMAL LOW (ref 4.0–10.5)

## 2017-01-12 LAB — BASIC METABOLIC PANEL
ANION GAP: 5 (ref 5–15)
BUN: 10 mg/dL (ref 6–20)
CHLORIDE: 111 mmol/L (ref 101–111)
CO2: 25 mmol/L (ref 22–32)
Calcium: 9.2 mg/dL (ref 8.9–10.3)
Creatinine, Ser: 0.64 mg/dL (ref 0.44–1.00)
GFR calc non Af Amer: >60 mL/min (ref >60)
Glucose, Bld: 93 mg/dL (ref 65–99)
POTASSIUM: 3.8 mmol/L (ref 3.5–5.1)
Sodium: 141 mmol/L (ref 135–145)

## 2017-01-12 MED ORDER — SULFAMETHOXAZOLE-TRIMETHOPRIM 800-160 MG PO TABS: 1.0000 | ORAL_TABLET | Freq: Two times a day (BID) | ORAL | 0 refills | Status: DC

## 2017-01-12 MED ORDER — TBO-FILGRASTIM 300 MCG/0.5ML ~~LOC~~ SOSY
300.0000 ug | PREFILLED_SYRINGE | Freq: Once | SUBCUTANEOUS | Status: AC
  Administered 2017-01-12: 300 ug via SUBCUTANEOUS
  Filled 2017-01-12: qty 0.5

## 2017-01-12 NOTE — Discharge Instructions (Signed)

## 2017-01-12 NOTE — Progress Notes (Signed)
76 y.o. woman with 4 right frontal brain metastases from non-small cell cancer of the left lower lung completed 07-31-16 brain,review25-18 MRI brain mouth concerns, FU.   Headache:No Dizziness:No Nausea/vomiting:No, has compazine for nausea Diplopia:No Ringing in ears:No Visual changes:No, had eyes checked yesterday. Fatigue:Denies fatigue.  Having problems sleeping at night. Cognitive changes:Alert and oriented x 3 with fluent speech,able to complete sentences without difficulty with word finding or organization of sentences. Weight: Wt Readings from Last 3 Encounters:  01/21/17 161 lb 6.4 oz (73.2 kg)  01/19/17 161 lb 3.2 oz (73.1 kg)  01/12/17 170 lb 14.4 oz (77.5 kg)   Imaging:01-18-17 MRI brain w wo contrast Lab: 12-24-16 Saw Dr. Julien Nordmann I discussed with the patient her treatment options and goals of care. I gave her the option of continuous observation and close monitoring versus consideration of palliative maintenance treatment with single agent Alimta. The patient is interested in maintenance treatment and she will receive cycle #1 on 01/04/2017. For the history of pulmonary embolism and deep venous thrombosis, she will continue her current treatment with Lovenox. For the swelling of the lower extremities, she will continue on Lasix on as-needed basis. The patient will come back for follow-up visit in around 4 weeks with the start of cycle #2. BP 125/83   Pulse (!) 102   Temp 98 F (36.7 C) (Oral)   Resp 18   Ht 5\' 2"  (1.575 m)   Wt 161 lb 6.4 oz (73.2 kg)   SpO2 98%   BMI 29.52 kg/m

## 2017-01-12 NOTE — Discharge Summary (Signed)
Physician Discharge Summary  Molly Black [facility] JKD:[id number] DOB: [birth year] DOA: 01/10/2017  PCP: [name], MD  Admit date: 01/10/2017 Discharge date: 01/12/2017  Admitted From: home  Disposition:  home   Recommendations for Outpatient Follow-up:  1. 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 Cough and congestion are unchanged. Fevers have not recurred. No complaints of weakness. Asking to go home.   Brief Summary: Molly Flynt Durhamis a 76 y.o.femalewith medical history significant forOSA on CPAP, GERD, DVT on therapeutic Lovenox, and stage 4  NSCL cancer of the right lung with metastases to adrenal glands and brain who presents to the emergency department with 3 days of lower abdominal discomfort, cough, malaise, fatigue, and fevers. Noted to have a fever to 101.1. She is noted to have a mildly positive UA but is not having dysuria. She admits to a cough with congestion which, she has told by Dr Julien Nordmann, is due to radiation changes in there lungs. She reports that her sputum is tan in color and not particularly bothersome. Her last chemo was about 1 wk ago and she is currently receiving a cycle every 3 wks.   Hospital Course:  Principal Problem:   SIRS (systemic inflammatory response syndrome)  - fever and leukopenia in setting of recent chemo  Active Problems: UTI - UA shows many bacteria, positive nitrites, WBC 6-30 - culture reveals K pneumoniae she has improved overall and would like to go home today rather than waiting  on a final culture result- I will discharge her with Bactrim and will f/u on sensitivities    Pancytopenia - WBC and platelets low - have given her Neupogen- WBC 2.8 and Platelets 116 - recommend repeating in a few days  Dehydration - has improved with IVF  Cough and congestion - due to mild radiation pneumonitis - Res virus panel negative    Adenocarcinoma of right lung, stage 4    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   amLODipine 5 MG tablet Commonly known as:  NORVASC Take 5 mg by mouth daily.   beta carotene w/minerals tablet Take 1 tablet by mouth daily.   CALCIUM 600+D PO Take by mouth 2 (two) times daily.   Cyanocobalamin 2500 MCG Tabs Take 1 tablet  by mouth daily.   dexamethasone 4 MG tablet Commonly known as:  DECADRON 4 mg by mouth twice a day the day before, day of and day after the chemotherapy every 3 weeks. What changed:  when to take this  additional instructions   enoxaparin 120 MG/0.8ML injection Commonly known as:  LOVENOX   feeding supplement Liqd Take 237 mLs by mouth AC breakfast. Takes occasionally   Fish Oil 1000 MG Caps Take 2,000 mg by mouth daily.   folic acid 1 MG tablet Commonly known as:  FOLVITE Take 1 tablet (1 mg total) by mouth daily.   Lacosamide 150 MG Tabs Commonly known as:  VIMPAT Take 1 tablet (150 mg total) by mouth 2 (two) times  daily.   lidocaine-prilocaine cream Commonly known as:  EMLA Apply 1 application topically as needed. Apply 1-2  tsp over port site 1.5 -2 hours prior to chemotherapy.   losartan 50 MG tablet Commonly known as:  COZAAR Take 50 mg by mouth daily.   multivitamin with minerals Tabs tablet Take 1 tablet by mouth daily.   omeprazole 40 MG capsule Commonly known as:  PRILOSEC Take 1 capsule (40 mg total) by mouth 2 (two) times daily. 30 minutes before breakfast and 30 minutes before dinner   polyethylene glycol packet Commonly known as:  MIRALAX / GLYCOLAX Take 17 g by mouth daily.   prochlorperazine 10 MG tablet Commonly known as:  COMPAZINE Take 10 mg by mouth every 6 (six) hours as needed.   sucralfate 1 g tablet Commonly known as:  CARAFATE Take 1 tablet (1 g total) by mouth 4 (four) times daily. What changed:  when to take this   sulfamethoxazole-trimethoprim 800-160 MG tablet Commonly known as:  BACTRIM DS,SEPTRA DS Take 1 tablet by mouth 2 (two) times daily.   SUPER PROBIOTIC PO Take 1 tablet by mouth daily.   Vitamin D3 3000 units Tabs Take 1 tablet by mouth daily.      Follow-up Information    [name], MD Follow up.   Specialty:  Internal Medicine Why:  please check a Bmet and CBC Contact information: [address] [place] [address] [phone]          No Known Allergies   Procedures/Studies:   Dg Chest 2 View  Result Date: 01/10/2017 CLINICAL DATA:  Initial evaluation for acute cough. History of lung cancer. EXAM: CHEST  2 VIEW COMPARISON:  Prior CT from 12/22/2016. FINDINGS: Left-sided Port-A-Cath in place. Stable cardiomegaly. Mediastinal silhouette normal. Lungs normally inflated. Patchy opacity within the retrocardiac left lower lobe favored to reflect known post radiation changes with bronchiectasis previously seen within this region. Superimposed infection would be difficult to exclude. Known postradiation changes at  the medial right upper lobe grossly stable as well. No other focal airspace disease. No pulmonary edema or pleural effusion. No pneumothorax. No acute osseous abnormality. Surgical clips overlie the right axilla. IMPRESSION: 1. Patchy opacity within the retrocardiac left lower lobe, favored to reflect known postradiation changes with bronchiectasis as seen on previous exams. Superimposed infection would be difficult to exclude. 2. No other active cardiopulmonary disease. Electronically Signed   By: Jeannine Boga M.D.   On: 01/10/2017 17:36   Ct Chest W Contrast  Result Date: 12/22/2016 CLINICAL DATA:  Right lung cancer with left adrenal metastasis diagnosed in 2017, brain metastases diagnosed December 2017, status post radiation therapy, not on chemotherapy due to toxicity. Remote history of right breast cancer in 1988. EXAM: CT CHEST,  ABDOMEN, AND PELVIS WITH CONTRAST TECHNIQUE: Multidetector CT imaging of the chest, abdomen and pelvis was performed following the standard protocol during bolus administration of intravenous contrast. CONTRAST:  1104mL ISOVUE-300 IOPAMIDOL (ISOVUE-300) INJECTION 61% COMPARISON:  10/05/2016. FINDINGS: CT CHEST FINDINGS Cardiovascular: The heart is normal in size. Trace pericardial fluid posteriorly (series 2/image 30). Coronary atherosclerosis of the LAD and right coronary artery. No evidence of thoracic aortic aneurysm. Left chest port terminates the cavoatrial junction. Mediastinum/Nodes: Small mediastinal lymph nodes, including an 8 mm short axis low right paratracheal node and a 10 mm short axis AP window node (series 2/ image 16). No suspicious hilar or axillary lymphadenopathy. Mild wall thickening along the mid/ distal esophagus (series 2/ image 21). Visualized thyroid is unremarkable. Lungs/Pleura: Radiation changes in the medial right upper lobe/ paramediastinal region (series 6/image 28). Faint adjacent ground-glass nodularity in the right upper lobe measuring up  to 8 mm (series 6/image 23), nonspecific. Radiation changes in the left lower lobe (series 6/image 53). Adjacent discrete ground-glass nodule in the left lower lobe measuring up to 1.6 cm (series 6/ image 44), unchanged, without definite solid component. No pleural effusion or pneumothorax. Musculoskeletal: Status post right mastectomy with reconstruction. Degenerative changes of the thoracic spine. CT ABDOMEN PELVIS FINDINGS Hepatobiliary: Scattered hepatic cysts, measuring up to 3.5 cm in segment 4B (series 2/image 50). Additional 3.4 cm atypical enhancing lesion in segment 6 (series 2/image 50),. No new/ suspicious hepatic lesions. Status post cholecystectomy. No intrahepatic or extrahepatic ductal dilatation. Pancreas: Within normal limits. Spleen: Within normal limits. Adrenals/Urinary Tract: Adrenal glands are within normal limits. Kidneys are within normal limits.  No hydronephrosis. Bladder is within normal limits. Stomach/Bowel: Stomach is notable for a small hiatal hernia. No evidence of bowel obstruction. Duodenal diverticulum (series 2/image 60). Normal appendix (series 2/ image 95). Mild left colonic diverticulosis, without evidence of diverticulitis. Vascular/Lymphatic: No evidence of abdominal aortic aneurysm. Atherosclerotic calcifications of the abdominal aorta and branch vessels. No suspicious abdominopelvic lymphadenopathy. Reproductive: Status post hysterectomy. No adnexal masses. Other: No abdominopelvic ascites. Musculoskeletal: Grade 1 anterolisthesis of L4 on L5. IMPRESSION: Radiation changes in the right upper lobe/paramediastinal region and left lower lobe. Small mediastinal lymph nodes measuring up to 10 mm short axis, stable versus mildly decreased. Stable 16 mm ground-glass nodule in the left lower lobe. Annual surveillance is suggested. No evidence of metastatic disease in the abdomen/pelvis. Electronically Signed   By: Julian Hy M.D.   On: 12/22/2016 09:52   Ct Abdomen Pelvis  W Contrast  Result Date: 12/22/2016 CLINICAL DATA:  Right lung cancer with left adrenal metastasis diagnosed in 2017, brain metastases diagnosed December 2017, status post radiation therapy, not on chemotherapy due to toxicity. Remote history of right breast cancer in 1988. EXAM: CT CHEST, ABDOMEN, AND PELVIS WITH CONTRAST TECHNIQUE: Multidetector CT imaging of the chest, abdomen and pelvis was performed following the standard protocol during bolus administration of intravenous contrast. CONTRAST:  127mL ISOVUE-300 IOPAMIDOL (ISOVUE-300) INJECTION 61% COMPARISON:  10/05/2016. FINDINGS: CT CHEST FINDINGS Cardiovascular: The heart is normal in size. Trace pericardial fluid posteriorly (series 2/image 30). Coronary atherosclerosis of the LAD and right coronary artery. No evidence of thoracic aortic aneurysm. Left chest port terminates the cavoatrial junction. Mediastinum/Nodes: Small mediastinal lymph nodes, including an 8 mm short axis low right paratracheal node and a 10 mm short axis AP window node (series 2/ image 16). No suspicious hilar or axillary lymphadenopathy. Mild wall thickening along the mid/ distal esophagus (series 2/ image 21). Visualized thyroid is  unremarkable. Lungs/Pleura: Radiation changes in the medial right upper lobe/ paramediastinal region (series 6/image 28). Faint adjacent ground-glass nodularity in the right upper lobe measuring up to 8 mm (series 6/image 23), nonspecific. Radiation changes in the left lower lobe (series 6/image 53). Adjacent discrete ground-glass nodule in the left lower lobe measuring up to 1.6 cm (series 6/ image 44), unchanged, without definite solid component. No pleural effusion or pneumothorax. Musculoskeletal: Status post right mastectomy with reconstruction. Degenerative changes of the thoracic spine. CT ABDOMEN PELVIS FINDINGS Hepatobiliary: Scattered hepatic cysts, measuring up to 3.5 cm in segment 4B (series 2/image 50). Additional 3.4 cm atypical enhancing  lesion in segment 6 (series 2/image 50),. No new/ suspicious hepatic lesions. Status post cholecystectomy. No intrahepatic or extrahepatic ductal dilatation. Pancreas: Within normal limits. Spleen: Within normal limits. Adrenals/Urinary Tract: Adrenal glands are within normal limits. Kidneys are within normal limits.  No hydronephrosis. Bladder is within normal limits. Stomach/Bowel: Stomach is notable for a small hiatal hernia. No evidence of bowel obstruction. Duodenal diverticulum (series 2/image 60). Normal appendix (series 2/ image 95). Mild left colonic diverticulosis, without evidence of diverticulitis. Vascular/Lymphatic: No evidence of abdominal aortic aneurysm. Atherosclerotic calcifications of the abdominal aorta and branch vessels. No suspicious abdominopelvic lymphadenopathy. Reproductive: Status post hysterectomy. No adnexal masses. Other: No abdominopelvic ascites. Musculoskeletal: Grade 1 anterolisthesis of L4 on L5. IMPRESSION: Radiation changes in the right upper lobe/paramediastinal region and left lower lobe. Small mediastinal lymph nodes measuring up to 10 mm short axis, stable versus mildly decreased. Stable 16 mm ground-glass nodule in the left lower lobe. Annual surveillance is suggested. No evidence of metastatic disease in the abdomen/pelvis. Electronically Signed   By: Julian Hy M.D.   On: 12/22/2016 09:52   Mm Screening Breast Tomo Uni L  Result Date: 12/16/2016 CLINICAL DATA:  Screening. EXAM: 2D DIGITAL SCREENING UNILATERAL LEFT MAMMOGRAM WITH CAD AND ADJUNCT TOMO COMPARISON:  Previous exam(s). ACR Breast Density Category c: The breast tissue is heterogeneously dense, which may obscure small masses. FINDINGS: The patient has had a right mastectomy. There are no findings suspicious for malignancy. Images were processed with CAD. IMPRESSION: No mammographic evidence of malignancy. A result letter of this screening mammogram will be mailed directly to the patient.  RECOMMENDATION: Screening mammogram in one year.  (Code:SM-L-55M) BI-RADS CATEGORY  1: Negative. Electronically Signed   By: Trude Mcburney M.D.   On: 12/16/2016 13:39       Discharge Exam: Vitals:   01/12/17 0417 01/12/17 1200  BP: (!) 122/54 123/66  Pulse: 85 69  Resp: 18   Temp: 98 F (36.7 C) 97.8 F (36.6 C)   Vitals:   01/12/17 0007 01/12/17 0417 01/12/17 0802 01/12/17 1200  BP:  (!) 122/54  123/66  Pulse:  85  69  Resp:  18    Temp:  98 F (36.7 C)  97.8 F (36.6 C)  TempSrc:  Oral  Oral  SpO2:  98% 98%   Weight: 77.5 kg (170 lb 14.4 oz)     Height:        General: Pt is alert, awake, not in acute distress Cardiovascular: RRR, S1/S2 +, no rubs, no gallops Respiratory: CTA bilaterally, no wheezing, no rhonchi Abdominal: Soft, NT, ND, bowel sounds + Extremities: no edema, no cyanosis    The results of significant diagnostics from this hospitalization (including imaging, microbiology, ancillary and laboratory) are listed below for reference.     Microbiology: Recent Results (from the past 240 hour(s))  Urine culture  Status: Abnormal (Preliminary result)   Collection Time: 01/10/17  5:48 PM  Result Value Ref Range Status   Specimen Description URINE, RANDOM  Final   Special Requests NONE  Final   Culture (A)  Final    >=100,000 COLONIES/mL KLEBSIELLA PNEUMONIAE SUSCEPTIBILITIES TO FOLLOW Performed at [facility], [address] [place], [place] [address]    Report Status PENDING  Incomplete  Culture, blood (Routine x 2)     Status: None (Preliminary result)   Collection Time: 01/10/17  6:18 PM  Result Value Ref Range Status   Specimen Description BLOOD LEFT HAND  Final   Special Requests IN PEDIATRIC BOTTLE Blood Culture adequate volume  Final   Culture   Final    NO GROWTH 1 DAY Performed at [facility], [place] [address], [place], [place] [address]    Report Status PENDING  Incomplete  Respiratory Panel by PCR     Status: None    Collection Time: 01/10/17 11:29 PM  Result Value Ref Range Status   Adenovirus NOT DETECTED NOT DETECTED Final   Coronavirus 229E NOT DETECTED NOT DETECTED Final   Coronavirus HKU1 NOT DETECTED NOT DETECTED Final   Coronavirus NL63 NOT DETECTED NOT DETECTED Final   Coronavirus OC43 NOT DETECTED NOT DETECTED Final   Metapneumovirus NOT DETECTED NOT DETECTED Final   Rhinovirus / Enterovirus NOT DETECTED NOT DETECTED Final   Influenza A NOT DETECTED NOT DETECTED Final   Influenza B NOT DETECTED NOT DETECTED Final   Parainfluenza Virus 1 NOT DETECTED NOT DETECTED Final   Parainfluenza Virus 2 NOT DETECTED NOT DETECTED Final   Parainfluenza Virus 3 NOT DETECTED NOT DETECTED Final   Parainfluenza Virus 4 NOT DETECTED NOT DETECTED Final   Respiratory Syncytial Virus NOT DETECTED NOT DETECTED Final   Bordetella pertussis NOT DETECTED NOT DETECTED Final   Chlamydophila pneumoniae NOT DETECTED NOT DETECTED Final   Mycoplasma pneumoniae NOT DETECTED NOT DETECTED Final    Comment: Performed at [facility], [place] [address], [place], [place] [address]  Culture, sputum-assessment     Status: None   Collection Time: 01/10/17 11:39 PM  Result Value Ref Range Status   Specimen Description SPU  Final   Special Requests Immunocompromised  Final   Sputum evaluation THIS SPECIMEN IS ACCEPTABLE FOR SPUTUM CULTURE  Final   Report Status 01/11/2017 FINAL  Final  Culture, respiratory (NON-Expectorated)     Status: None (Preliminary result)   Collection Time: 01/10/17 11:39 PM  Result Value Ref Range Status   Specimen Description SPU  Final   Special Requests Immunocompromised Reflexed from [id number]  Final   Gram Stain   Final    ABUNDANT WBC PRESENT, PREDOMINANTLY PMN NO SQUAMOUS EPITHELIAL CELLS SEEN NO ORGANISMS SEEN    Culture   Final    CULTURE REINCUBATED FOR BETTER GROWTH Performed at [facility], [place] [address], [place], [place] [address]    Report Status PENDING  Incomplete   Culture, blood (Routine x 2)     Status: None (Preliminary result)   Collection Time: 01/11/17  3:38 AM  Result Value Ref Range Status   Specimen Description BLOOD LEFT ANTECUBITAL  Final   Special Requests   Final    BOTTLES DRAWN AEROBIC ONLY Blood Culture results may not be optimal due to an inadequate volume of blood received in culture bottles   Culture   Final    NO GROWTH 1 DAY Performed at [facility],  [address], [place], [place] [address]    Report Status PENDING  Incomplete     Labs: BNP (last 3 results) No results for input(s): BNP in the last 8760 hours. Basic Metabolic Panel:  Recent Labs Lab 01/10/17 1818 01/11/17 0338 01/12/17 0405  NA 137 140 141  K 4.0 3.5 3.8  CL 106 111 111  CO2 23 24 25   GLUCOSE 101* 110* 93  BUN 22* 18 10  CREATININE 0.64 0.63 0.64  CALCIUM 8.9 8.5* 9.2   Liver Function Tests:  Recent Labs Lab 01/10/17 1818  AST 39  ALT 32  ALKPHOS 29*  BILITOT 0.8  PROT 5.8*  ALBUMIN 3.3*   No results for input(s): LIPASE, AMYLASE in the last 168 hours. No results for input(s): AMMONIA in the last 168 hours. CBC:  Recent Labs Lab 01/10/17 1818 01/11/17 0338 01/12/17 0405  WBC 2.5* 1.5* 2.8*  NEUTROABS 1.4* 0.8*  --   HGB 9.3* 9.6* 9.9*  HCT 26.7* 27.1* 29.0*  MCV 90.2 90.9 92.4  PLT 149* 113* 116*   Cardiac Enzymes: No results for input(s): CKTOTAL, CKMB, CKMBINDEX, TROPONINI in the last 168 hours. BNP: Invalid input(s): POCBNP CBG: No results for input(s): GLUCAP in the last 168 hours. D-Dimer No results for input(s): DDIMER in the last 72 hours. Hgb A1c No results for input(s): HGBA1C in the last 72 hours. Lipid Profile No results for input(s): CHOL, HDL, LDLCALC, TRIG, CHOLHDL, LDLDIRECT in the last 72 hours. Thyroid function studies No results for input(s): TSH, T4TOTAL, T3FREE, THYROIDAB in the last 72 hours.  Invalid input(s): FREET3 Anemia work up No results for input(s): VITAMINB12, FOLATE,  FERRITIN, TIBC, IRON, RETICCTPCT in the last 72 hours. Urinalysis    Component Value Date/Time   COLORURINE YELLOW 01/10/2017 1709   APPEARANCEUR CLOUDY (A) 01/10/2017 1709   LABSPEC 1.025 01/10/2017 1709   LABSPEC 1.010 10/16/2016 1530   PHURINE 5.5 01/10/2017 1709   GLUCOSEU NEGATIVE 01/10/2017 1709   GLUCOSEU Negative 10/16/2016 1530   HGBUR TRACE (A) 01/10/2017 1709   BILIRUBINUR NEGATIVE 01/10/2017 1709   BILIRUBINUR Negative 10/16/2016 1530   KETONESUR NEGATIVE 01/10/2017 1709   PROTEINUR TRACE (A) 01/10/2017 1709   UROBILINOGEN 0.2 10/16/2016 1530   NITRITE POSITIVE (A) 01/10/2017 1709   LEUKOCYTESUR TRACE (A) 01/10/2017 1709   LEUKOCYTESUR Negative 10/16/2016 1530   Sepsis Labs Invalid input(s): PROCALCITONIN,  WBC,  LACTICIDVEN Microbiology Recent Results (from the past 240 hour(s))  Urine culture     Status: Abnormal (Preliminary result)   Collection Time: 01/10/17  5:48 PM  Result Value Ref Range Status   Specimen Description URINE, RANDOM  Final   Special Requests NONE  Final   Culture (A)  Final    >=100,000 COLONIES/mL KLEBSIELLA PNEUMONIAE SUSCEPTIBILITIES TO FOLLOW Performed at [facility], [place] [address] [place], [place] [address]    Report Status PENDING  Incomplete  Culture, blood (Routine x 2)     Status: None (Preliminary result)   Collection Time: 01/10/17  6:18 PM  Result Value Ref Range Status   Specimen Description BLOOD LEFT HAND  Final   Special Requests IN PEDIATRIC BOTTLE Blood Culture adequate volume  Final   Culture   Final    NO GROWTH 1 DAY Performed at [facility], [place] [address] [place], [place] [address]    Report Status PENDING  Incomplete  Respiratory Panel by PCR     Status: None   Collection Time: 01/10/17 11:29 PM  Result Value Ref Range Status   Adenovirus NOT DETECTED NOT DETECTED Final   Coronavirus 229E NOT DETECTED NOT DETECTED Final   Coronavirus HKU1 NOT DETECTED NOT DETECTED Final   Coronavirus NL63  NOT DETECTED NOT DETECTED Final   Coronavirus OC43 NOT DETECTED NOT DETECTED Final   Metapneumovirus NOT DETECTED NOT DETECTED Final   Rhinovirus / Enterovirus NOT DETECTED NOT DETECTED Final   Influenza A NOT DETECTED NOT DETECTED Final   Influenza B NOT DETECTED NOT DETECTED Final   Parainfluenza Virus 1 NOT DETECTED NOT DETECTED Final   Parainfluenza Virus 2 NOT DETECTED NOT DETECTED Final   Parainfluenza Virus 3 NOT DETECTED NOT DETECTED Final   Parainfluenza Virus 4 NOT DETECTED NOT DETECTED Final   Respiratory Syncytial Virus NOT DETECTED NOT DETECTED Final   Bordetella pertussis NOT DETECTED NOT DETECTED Final   Chlamydophila pneumoniae NOT DETECTED NOT DETECTED Final   Mycoplasma pneumoniae NOT DETECTED NOT DETECTED Final    Comment: Performed at [facility], [place] [address] [place], [place] [address]  Culture, sputum-assessment     Status: None   Collection Time: 01/10/17 11:39 PM  Result Value Ref Range Status   Specimen Description SPU  Final   Special Requests Immunocompromised  Final   Sputum evaluation THIS SPECIMEN IS ACCEPTABLE FOR SPUTUM CULTURE  Final   Report Status 01/11/2017 FINAL  Final  Culture, respiratory (NON-Expectorated)     Status: None (Preliminary result)   Collection Time: 01/10/17 11:39 PM  Result Value Ref Range Status   Specimen Description SPU  Final   Special Requests Immunocompromised Reflexed from [id number]  Final   Gram Stain   Final    ABUNDANT WBC PRESENT, PREDOMINANTLY PMN NO SQUAMOUS EPITHELIAL CELLS SEEN NO ORGANISMS SEEN    Culture   Final    CULTURE REINCUBATED FOR BETTER GROWTH Performed at [facility], [place] [address], [place], [place] [address]    Report Status PENDING  Incomplete  Culture, blood (Routine x 2)     Status: None (Preliminary result)   Collection Time: 01/11/17  3:38 AM  Result Value Ref Range Status   Specimen Description BLOOD LEFT ANTECUBITAL  Final   Special Requests   Final    BOTTLES DRAWN  AEROBIC ONLY Blood Culture results may not be optimal due to an inadequate volume of blood received in culture bottles   Culture   Final    NO GROWTH 1 DAY Performed at [facility], [place] [address] [place], [place] [address]    Report Status PENDING  Incomplete     Time coordinating discharge: Over 30 minutes  SIGNED:   Debbe Odea, MD  [facility] 01/12/2017, 5:47 PM Pager   If 7PM-7AM, please contact night-coverage [email or web address] Password [email or web address]

## 2017-01-12 NOTE — Progress Notes (Signed)
Discharge instructions reviewed with patient and niece, both verbalized understanding.Patient discharged via private vehicle.

## 2017-01-13 LAB — URINE CULTURE: Culture: >=100000 — AB

## 2017-01-13 LAB — CULTURE, RESPIRATORY

## 2017-01-13 LAB — CULTURE, RESPIRATORY W GRAM STAIN: Culture: NORMAL

## 2017-01-16 ENCOUNTER — Other Ambulatory Visit: Payer: Self-pay | Admitting: Internal Medicine

## 2017-01-16 LAB — CULTURE, BLOOD (ROUTINE X 2)
Culture: NO GROWTH
Culture: NO GROWTH
SPECIAL REQUESTS: ADEQUATE

## 2017-01-18 ENCOUNTER — Ambulatory Visit (HOSPITAL_COMMUNITY)
Admission: RE | Admit: 2017-01-18 | Discharge: 2017-01-18 | Disposition: A | Discharge status: Home / Self Care | Payer: Medicare Other | Source: Ambulatory Visit | Attending: Radiation Oncology | Admitting: Radiation Oncology

## 2017-01-18 ENCOUNTER — Ambulatory Visit (HOSPITAL_COMMUNITY): Payer: Medicare Other

## 2017-01-18 DIAGNOSIS — C7931 Secondary malignant neoplasm of brain: Secondary | ICD-10-CM | POA: Insufficient documentation

## 2017-01-18 DIAGNOSIS — J341 Cyst and mucocele of nose and nasal sinus: Secondary | ICD-10-CM | POA: Insufficient documentation

## 2017-01-18 DIAGNOSIS — G319 Degenerative disease of nervous system, unspecified: Secondary | ICD-10-CM | POA: Diagnosis not present

## 2017-01-18 MED ORDER — GADOBENATE DIMEGLUMINE 529 MG/ML IV SOLN
15.0000 mL | Freq: Once | INTRAVENOUS | Status: AC
  Administered 2017-01-18: 15 mL via INTRAVENOUS

## 2017-01-19 ENCOUNTER — Encounter: Payer: Self-pay | Admitting: Neurology

## 2017-01-19 ENCOUNTER — Ambulatory Visit (INDEPENDENT_AMBULATORY_CARE_PROVIDER_SITE_OTHER): Payer: Medicare Other | Admitting: Neurology

## 2017-01-19 VITALS — BP 120/75 | HR 78 | Ht 62.0 in | Wt 161.2 lb

## 2017-01-19 DIAGNOSIS — G40109 Localization-related (focal) (partial) symptomatic epilepsy and epileptic syndromes with simple partial seizures, not intractable, without status epilepticus: Secondary | ICD-10-CM

## 2017-01-19 DIAGNOSIS — R569 Unspecified convulsions: Secondary | ICD-10-CM

## 2017-01-19 DIAGNOSIS — E889 Metabolic disorder, unspecified: Secondary | ICD-10-CM

## 2017-01-19 NOTE — Patient Instructions (Addendum)
Remember to drink plenty of fluid, eat healthy meals and do not skip any meals. Try to eat protein with a every meal and eat a healthy snack such as fruit or nuts in between meals. Try to keep a regular sleep-wake schedule and try to exercise daily, particularly in the form of walking, 20-30 minutes a day, if you can.   As far as your medications are concerned, I would like to suggest: Continue Vimpat  As far as diagnostic testing: EEG   I would like to see you back in 4 months, sooner if we need to. Please call us with any interim questions, concerns, problems, updates or refill requests.   Our phone number is [phone]. We also have an after hours call service for urgent matters and there is a physician on-call for urgent questions. For any emergencies you know to call 911 or go to the nearest emergency room
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
## 2017-01-21 ENCOUNTER — Ambulatory Visit
Admission: RE | Admit: 2017-01-21 | Discharge: 2017-01-21 | Disposition: A | Discharge status: Home / Self Care | Payer: Medicare Other | Source: Ambulatory Visit | Attending: Urology | Admitting: Urology

## 2017-01-21 ENCOUNTER — Encounter: Payer: Self-pay | Admitting: Urology

## 2017-01-21 VITALS — BP 125/83 | HR 102 | Temp 98.0°F | Resp 18 | Ht 62.0 in | Wt 161.4 lb

## 2017-01-21 DIAGNOSIS — Z9221 Personal history of antineoplastic chemotherapy: Secondary | ICD-10-CM | POA: Diagnosis not present

## 2017-01-21 DIAGNOSIS — Z923 Personal history of irradiation: Secondary | ICD-10-CM | POA: Diagnosis not present

## 2017-01-21 DIAGNOSIS — C3491 Malignant neoplasm of unspecified part of right bronchus or lung: Secondary | ICD-10-CM | POA: Diagnosis not present

## 2017-01-21 DIAGNOSIS — Z87440 Personal history of urinary (tract) infections: Secondary | ICD-10-CM | POA: Insufficient documentation

## 2017-01-21 DIAGNOSIS — C7971 Secondary malignant neoplasm of right adrenal gland: Secondary | ICD-10-CM | POA: Insufficient documentation

## 2017-01-21 DIAGNOSIS — Z79899 Other long term (current) drug therapy: Secondary | ICD-10-CM | POA: Diagnosis not present

## 2017-01-21 DIAGNOSIS — C50911 Malignant neoplasm of unspecified site of right female breast: Secondary | ICD-10-CM

## 2017-01-21 DIAGNOSIS — C7972 Secondary malignant neoplasm of left adrenal gland: Secondary | ICD-10-CM

## 2017-01-21 DIAGNOSIS — C7931 Secondary malignant neoplasm of brain: Secondary | ICD-10-CM | POA: Diagnosis not present

## 2017-01-21 DIAGNOSIS — C801 Malignant (primary) neoplasm, unspecified: Secondary | ICD-10-CM

## 2017-01-21 DIAGNOSIS — Z171 Estrogen receptor negative status [ER-]: Secondary | ICD-10-CM

## 2017-01-21 NOTE — Progress Notes (Signed)
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 PTV 1 Right frontal 16 mm was treated to 20 Gy in 1 fraction. 2. PTV 2 & 3 Right frontal 16 mm was treated to 20 Gy in 1 fraction. 3. PTV 4 Right frontal 8 mm was treated to 20 Gy in 1 fraction.   05/25/16 - 07/07/16 : 1. The right lung target was treated to 60 Gy in 30 fractions of 2 Gy              2. SBRT Right Adrenal : 50 Gy in 5 fractions of 10 Gy  Narrative:  The patient returns today for routine follow up.  In summary she was diagnosed with what was thought to be stage III disease and received concurrent chemotherapy and radiotherapy. During the course of her treatment, it was discovered by PET scan that she in fact had Stage IV disease with right adrenal metastases. She completed definitive lung treatment and SBRT to the right adrenal gland as this was felt to be oliogometastatic disease. She presented with stroke like symptoms in December 2017 and was admitted to [facility]. After an MRA was performed, it was identified that rather than a CVA, she had several metastatic deposits in the brain, and she subsequently completed SRS for treatment of 4 metastatic lesions.    Recent follow up MRI brain on 01/18/17 reveals continued interval response to therapy, with previously identified four metastatic lesions clustered at the anterior right frontal lobe now only faintly visualized. No associated edema and no new lesions identified.  She reports that she has continued maintenance systemic therapy with Alimta under the direction of Dr. Julien Nordmann.  She had previously been on Avastin in  addition to Alimta but had to discontinue Avastin after 5 cycles due to a hypersensitivity reaction. She was recently treated for a UTI which required hospitalization 6/17- 01/12/2017. She has completed antibiotics and has had no further fevers, chills or malaise.  On review of systems, the patient reports that she has completed her antibiotics for recent UTI and has not had further fever, chills or malaise.  Her energy is much improved and overall she is feeling well.  She denies headaches, visual auditory aquity changes, balance issues or difficulty with word finding. She denies any chest pain, shortness of breath, N/V, fevers or chills. No other complaints are verbalized.  ALLERGIES:  has No Known Allergies.  Meds: Current Outpatient Prescriptions  Medication Sig Dispense Refill  . ALPRAZolam (XANAX) 0.5 MG tablet Take 0.5 mg by mouth as needed.     Marland Kitchen amLODipine (NORVASC) 5 MG tablet Take 5 mg by mouth daily.     . beta carotene w/minerals (OCUVITE) tablet Take 1 tablet by mouth daily.    . Cyanocobalamin 2500 MCG TABS Take 1 tablet by mouth daily.    Marland Kitchen dexamethasone (DECADRON) 4 MG tablet 4 mg by mouth twice a day the day before, day of and day after the chemotherapy every 3 weeks. (Patient taking differently: 2 (two) times daily. 4 mg by mouth twice a day the day before, day of and day  after the chemotherapy every 3 weeks.) 40 tablet 1  . enoxaparin (LOVENOX) 120 MG/0.8ML injection     . feeding supplement (ENSURE CLINICAL STRENGTH) LIQD Take 237 mLs by mouth AC breakfast. Takes occasionally    . folic acid (FOLVITE) 1 MG tablet TAKE 1 TABLET BY MOUTH ONCE A DAY. 30 tablet 0  . Lacosamide (VIMPAT) 150 MG TABS Take 1 tablet (150 mg total) by mouth 2 (two) times daily. 60 tablet 11  . lidocaine-prilocaine (EMLA) cream Apply 1 application topically as needed. Apply 1-2  tsp over port site 1.5 -2 hours prior to chemotherapy. 30 g 0  . losartan (COZAAR) 50 MG tablet Take 50 mg by mouth daily.       Marland Kitchen omeprazole (PRILOSEC) 40 MG capsule Take 1 capsule (40 mg total) by mouth 2 (two) times daily. 30 minutes before breakfast and 30 minutes before dinner 180 capsule 1  . sucralfate (CARAFATE) 1 g tablet Take 1 tablet (1 g total) by mouth 4 (four) times daily. (Patient taking differently: Take 1 g by mouth 2 (two) times daily. ) 120 tablet 3  . prochlorperazine (COMPAZINE) 10 MG tablet Take 10 mg by mouth every 6 (six) hours as needed.     No current facility-administered medications for this encounter.     Physical Findings:  height is 5\' 2"  (1.575 m) and weight is 161 lb 6.4 oz (73.2 kg). Her oral temperature is 98 F (36.7 C). Her blood pressure is 125/83 and her pulse is 102 (abnormal). Her respiration is 18 and oxygen saturation is 98%.  In general this is a well appearing caucasian female in no acute distress. She's alert and oriented x4 and appropriate throughout the examination. Cardiopulmonary assessment is negative for acute distress and she exhibits normal effort. She appears neurologically intact.  Lab Findings: Lab Results  Component Value Date   WBC 2.8 (L) 01/12/2017   HGB 9.9 (L) 01/12/2017   HCT 29.0 (L) 01/12/2017   MCV 92.4 01/12/2017   PLT 116 (L) 01/12/2017     Radiographic Findings: Dg Chest 2 View  Result Date: 01/10/2017 CLINICAL DATA:  Initial evaluation for acute cough. History of lung cancer. EXAM: CHEST  2 VIEW COMPARISON:  Prior CT from 12/22/2016. FINDINGS: Left-sided Port-A-Cath in place. Stable cardiomegaly. Mediastinal silhouette normal. Lungs normally inflated. Patchy opacity within the retrocardiac left lower lobe favored to reflect known post radiation changes with bronchiectasis previously seen within this region. Superimposed infection would be difficult to exclude. Known postradiation changes at the medial right upper lobe grossly stable as well. No other focal airspace disease. No pulmonary edema or pleural effusion. No pneumothorax. No acute  osseous abnormality. Surgical clips overlie the right axilla. IMPRESSION: 1. Patchy opacity within the retrocardiac left lower lobe, favored to reflect known postradiation changes with bronchiectasis as seen on previous exams. Superimposed infection would be difficult to exclude. 2. No other active cardiopulmonary disease. Electronically Signed   By: Jeannine Boga M.D.   On: 01/10/2017 17:36   Mr Jeri Cos LZ Contrast  Result Date: 01/18/2017 CLINICAL DATA:  Follow-up exam for brain metastases, history of metastatic non-small cell lung cancer, status post stereotactic radiotherapy. SRS protocol. EXAM: MRI HEAD WITHOUT AND WITH CONTRAST TECHNIQUE: Multiplanar, multiecho pulse sequences of the brain and surrounding structures were obtained without and with intravenous contrast. CONTRAST:  13mL MULTIHANCE GADOBENATE DIMEGLUMINE 529 MG/ML IV SOLN COMPARISON:  Prior brain MRI from 10/08/2016. FINDINGS: Brain: Study somewhat degraded by motion artifact. Previously seen metastatic lesions clustered  within the right frontal lobe are improved as compared to previous exam. At site of previously seen largest lesion, there is now only faintly visible 3 mm nodular focus of enhancement (series 10, image 109, previously 4 mm). Possible additional 2 mm punctate focus of nodular enhancement more superiorly and anteriorly within the right frontal lobe (series 10, image 18), previously 3 mm. Additional subtle 3 mm focus of nodular enhancement more inferiorly within the right frontal lobe now only faintly visible (series 10, image 112), previously 3 mm. Possible minimal residual punctate enhancement at previously seen site of the fourth metastatic lesion (Series 10, image 124) No other definite lesions identified. No new lesions. No evidence for acute or subacute infarct. Gray-white matter differentiation maintained. No other evidence for chronic or interval infarction. Small amount of susceptibility artifact associated with  the right frontal lobe metastases, stable. No other evidence for acute or chronic intracranial hemorrhage. Patchy T2/FLAIR hyperintensity within the periventricular and deep white matter both cerebral hemispheres most likely related chronic small vascular disease, stable. No other mass lesion. No midline shift or mass effect. No hydrocephalus. No extra-axial fluid collection. Pituitary gland suprasellar region within normal limits. Midline structures intact and normal. Vascular: Major intracranial vascular flow voids are maintained. Skull and upper cervical spine: Craniocervical junction within normal limits. Visualized upper cervical spine unremarkable. Bone marrow signal intensity within normal limits. No scalp soft tissue abnormality. Sinuses/Orbits: Globes and orbital soft tissues within normal limits. Patient status post lens extraction bilaterally. Scattered mucosal thickening throughout the paranasal sinuses. Superimposed retention cyst present within the maxillary sinuses and right sphenoid sinus. Trace bilateral mastoid effusions. Inner ear structures normal. IMPRESSION: 1. Continued interval response to therapy, with previously identified four metastatic lesions clustered at the anterior right frontal lobe now only faintly visualized. No associated edema. No new lesions identified. 2. 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 No other acute intracranial process. Electronically Signed   By: Jeannine Boga M.D.   On: 01/18/2017 16:20    Impression/Plan: 1. 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 The patient appears clinically to be stable. She will continue with Alimta per Dr. Julien Nordmann. She is concerned about her ability to continue to afford her medications since she has nearly reached her "doughnut hole" already.  I will place a social work consult to discuss any available medication assistance programs that she may  qualify for.  We will plan a repeat MRI in 3 months time for continued surveillance of her brain disease. She is in agreement with the stated plan and will call sooner if she has questions or concerns.     Nicholos Johns, PA-C

## 2017-01-25 ENCOUNTER — Encounter: Payer: Self-pay | Admitting: Internal Medicine

## 2017-01-25 ENCOUNTER — Ambulatory Visit (HOSPITAL_BASED_OUTPATIENT_CLINIC_OR_DEPARTMENT_OTHER): Payer: Medicare Other

## 2017-01-25 ENCOUNTER — Telehealth: Payer: Self-pay | Admitting: Internal Medicine

## 2017-01-25 ENCOUNTER — Ambulatory Visit (HOSPITAL_BASED_OUTPATIENT_CLINIC_OR_DEPARTMENT_OTHER): Payer: Medicare Other | Admitting: Internal Medicine

## 2017-01-25 ENCOUNTER — Other Ambulatory Visit (HOSPITAL_BASED_OUTPATIENT_CLINIC_OR_DEPARTMENT_OTHER): Payer: Medicare Other

## 2017-01-25 VITALS — BP 117/82 | HR 75 | Temp 98.2°F | Resp 18 | Ht 62.0 in | Wt 164.1 lb

## 2017-01-25 DIAGNOSIS — C801 Malignant (primary) neoplasm, unspecified: Principal | ICD-10-CM

## 2017-01-25 DIAGNOSIS — Z5111 Encounter for antineoplastic chemotherapy: Secondary | ICD-10-CM | POA: Diagnosis not present

## 2017-01-25 DIAGNOSIS — C3491 Malignant neoplasm of unspecified part of right bronchus or lung: Secondary | ICD-10-CM

## 2017-01-25 DIAGNOSIS — C7972 Secondary malignant neoplasm of left adrenal gland: Secondary | ICD-10-CM | POA: Diagnosis not present

## 2017-01-25 DIAGNOSIS — C3431 Malignant neoplasm of lower lobe, right bronchus or lung: Secondary | ICD-10-CM

## 2017-01-25 DIAGNOSIS — C7931 Secondary malignant neoplasm of brain: Secondary | ICD-10-CM | POA: Diagnosis not present

## 2017-01-25 LAB — COMPREHENSIVE METABOLIC PANEL
ALT: 25 U/L (ref 0–55)
AST: 23 U/L (ref 5–34)
Albumin: 3.4 g/dL — ABNORMAL LOW (ref 3.5–5.0)
Alkaline Phosphatase: 34 U/L — ABNORMAL LOW (ref 40–150)
Anion Gap: 5 mEq/L (ref 3–11)
BUN: 13.3 mg/dL (ref 7.0–26.0)
CHLORIDE: 105 meq/L (ref 98–109)
CO2: 26 mEq/L (ref 22–29)
Calcium: 10.3 mg/dL (ref 8.4–10.4)
Creatinine: 0.7 mg/dL (ref 0.6–1.1)
EGFR: 86 mL/min/{1.73_m2} — ABNORMAL LOW (ref >90)
GLUCOSE: 135 mg/dL (ref 70–140)
POTASSIUM: 4.9 meq/L (ref 3.5–5.1)
SODIUM: 136 meq/L (ref 136–145)
Total Bilirubin: 0.47 mg/dL (ref 0.20–1.20)
Total Protein: 6.3 g/dL — ABNORMAL LOW (ref 6.4–8.3)

## 2017-01-25 LAB — CBC WITH DIFFERENTIAL/PLATELET
BASO%: 0 % (ref 0.0–2.0)
BASOS ABS: 0 10*3/uL (ref 0.0–0.1)
EOS%: 0 % (ref 0.0–7.0)
Eosinophils Absolute: 0 10*3/uL (ref 0.0–0.5)
HCT: 32.7 % — ABNORMAL LOW (ref 34.8–46.6)
HEMOGLOBIN: 11 g/dL — AB (ref 11.6–15.9)
LYMPH%: 6.2 % — ABNORMAL LOW (ref 14.0–49.7)
MCH: 31.8 pg (ref 25.1–34.0)
MCHC: 33.6 g/dL (ref 31.5–36.0)
MCV: 94.5 fL (ref 79.5–101.0)
MONO#: 0.1 10*3/uL (ref 0.1–0.9)
MONO%: 2.6 % (ref 0.0–14.0)
NEUT#: 4.6 10*3/uL (ref 1.5–6.5)
NEUT%: 91.2 % — AB (ref 38.4–76.8)
Platelets: 177 10*3/uL (ref 145–400)
RBC: 3.46 10*6/uL — ABNORMAL LOW (ref 3.70–5.45)
RDW: 16.6 % — AB (ref 11.2–14.5)
WBC: 5 10*3/uL (ref 3.9–10.3)
lymph#: 0.3 10*3/uL — ABNORMAL LOW (ref 0.9–3.3)

## 2017-01-25 LAB — UA PROTEIN, DIPSTICK - CHCC: Protein, ur: NEGATIVE mg/dL

## 2017-01-25 MED ORDER — PROCHLORPERAZINE MALEATE 10 MG PO TABS
10.0000 mg | ORAL_TABLET | Freq: Once | ORAL | Status: AC
  Administered 2017-01-25: 10 mg via ORAL

## 2017-01-25 MED ORDER — HEPARIN SOD (PORK) LOCK FLUSH 100 UNIT/ML IV SOLN
500.0000 [IU] | Freq: Once | INTRAVENOUS | Status: AC | PRN
  Administered 2017-01-25: 500 [IU]
  Filled 2017-01-25: qty 5

## 2017-01-25 MED ORDER — SODIUM CHLORIDE 0.9% FLUSH
10.0000 mL | INTRAVENOUS | Status: DC | PRN
  Administered 2017-01-25: 10 mL
  Filled 2017-01-25: qty 10

## 2017-01-25 MED ORDER — PROCHLORPERAZINE MALEATE 10 MG PO TABS
ORAL_TABLET | ORAL | Status: AC
  Filled 2017-01-25: qty 1

## 2017-01-25 MED ORDER — SODIUM CHLORIDE 0.9 % IV SOLN
Freq: Once | INTRAVENOUS | Status: AC
  Administered 2017-01-25: 13:00:00 via INTRAVENOUS

## 2017-01-25 MED ORDER — SODIUM CHLORIDE 0.9 % IV SOLN
500.0000 mg/m2 | Freq: Once | INTRAVENOUS | Status: AC
  Administered 2017-01-25: 900 mg via INTRAVENOUS
  Filled 2017-01-25: qty 20

## 2017-01-25 NOTE — Telephone Encounter (Signed)
Scheduled appt per 7/2 los - patient called to treatment area before schedule was  Finished - will get new schedule in the treatment area.

## 2017-01-25 NOTE — Patient Instructions (Signed)
[facility] Discharge Instructions for Patients Receiving Chemotherapy  Today you received the following chemotherapy agents pemetrexed (Alimta)   To help prevent nausea and vomiting after your treatment, we encourage you to take your nausea medication as directed   If you develop nausea and vomiting that is not controlled by your nausea medication, call the clinic.   BELOW ARE SYMPTOMS THAT SHOULD BE REPORTED IMMEDIATELY:  *FEVER GREATER THAN 100.5 F  *CHILLS WITH OR WITHOUT FEVER  NAUSEA AND VOMITING THAT IS NOT CONTROLLED WITH YOUR NAUSEA MEDICATION  *UNUSUAL SHORTNESS OF BREATH  *UNUSUAL BRUISING OR BLEEDING  TENDERNESS IN MOUTH AND THROAT WITH OR WITHOUT PRESENCE OF ULCERS  *URINARY PROBLEMS  *BOWEL PROBLEMS  UNUSUAL RASH Items with * indicate a potential emergency and should be followed up as soon as possible.  Feel free to call the clinic you have any questions or concerns. The clinic phone number is [phone].  Please show the [place] at check-in to the Emergency Department and triage nurse.
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
## 2017-02-15 ENCOUNTER — Telehealth: Payer: Self-pay | Admitting: Internal Medicine

## 2017-02-15 ENCOUNTER — Ambulatory Visit: Payer: Medicare Other

## 2017-02-15 ENCOUNTER — Encounter: Payer: Self-pay | Admitting: Internal Medicine

## 2017-02-15 ENCOUNTER — Ambulatory Visit (HOSPITAL_BASED_OUTPATIENT_CLINIC_OR_DEPARTMENT_OTHER): Payer: Medicare Other | Admitting: Internal Medicine

## 2017-02-15 ENCOUNTER — Other Ambulatory Visit (HOSPITAL_BASED_OUTPATIENT_CLINIC_OR_DEPARTMENT_OTHER): Payer: Medicare Other

## 2017-02-15 ENCOUNTER — Ambulatory Visit (HOSPITAL_BASED_OUTPATIENT_CLINIC_OR_DEPARTMENT_OTHER): Payer: Medicare Other

## 2017-02-15 VITALS — BP 136/69 | HR 84 | Temp 97.7°F | Resp 18 | Ht 62.0 in | Wt 164.0 lb

## 2017-02-15 DIAGNOSIS — Z5111 Encounter for antineoplastic chemotherapy: Secondary | ICD-10-CM

## 2017-02-15 DIAGNOSIS — C801 Malignant (primary) neoplasm, unspecified: Secondary | ICD-10-CM

## 2017-02-15 DIAGNOSIS — C7972 Secondary malignant neoplasm of left adrenal gland: Secondary | ICD-10-CM | POA: Diagnosis not present

## 2017-02-15 DIAGNOSIS — C3431 Malignant neoplasm of lower lobe, right bronchus or lung: Secondary | ICD-10-CM

## 2017-02-15 DIAGNOSIS — Z95828 Presence of other vascular implants and grafts: Secondary | ICD-10-CM

## 2017-02-15 DIAGNOSIS — C3491 Malignant neoplasm of unspecified part of right bronchus or lung: Secondary | ICD-10-CM

## 2017-02-15 DIAGNOSIS — C7931 Secondary malignant neoplasm of brain: Secondary | ICD-10-CM | POA: Diagnosis not present

## 2017-02-15 LAB — COMPREHENSIVE METABOLIC PANEL
ALBUMIN: 3.4 g/dL — AB (ref 3.5–5.0)
ALK PHOS: 34 U/L — AB (ref 40–150)
ALT: 45 U/L (ref 0–55)
AST: 32 U/L (ref 5–34)
Anion Gap: 9 mEq/L (ref 3–11)
BILIRUBIN TOTAL: 0.52 mg/dL (ref 0.20–1.20)
BUN: 17.4 mg/dL (ref 7.0–26.0)
CALCIUM: 9.6 mg/dL (ref 8.4–10.4)
CO2: 21 mEq/L — ABNORMAL LOW (ref 22–29)
CREATININE: 0.8 mg/dL (ref 0.6–1.1)
Chloride: 104 mEq/L (ref 98–109)
EGFR: 70 mL/min/{1.73_m2} — ABNORMAL LOW (ref >90)
GLUCOSE: 160 mg/dL — AB (ref 70–140)
Potassium: 4.6 mEq/L (ref 3.5–5.1)
Sodium: 134 mEq/L — ABNORMAL LOW (ref 136–145)
TOTAL PROTEIN: 6.3 g/dL — AB (ref 6.4–8.3)

## 2017-02-15 LAB — CBC WITH DIFFERENTIAL/PLATELET
BASO%: 0.2 % (ref 0.0–2.0)
Basophils Absolute: 0 10*3/uL (ref 0.0–0.1)
EOS%: 0.2 % (ref 0.0–7.0)
Eosinophils Absolute: 0 10*3/uL (ref 0.0–0.5)
HEMATOCRIT: 32.5 % — AB (ref 34.8–46.6)
HEMOGLOBIN: 10.9 g/dL — AB (ref 11.6–15.9)
LYMPH#: 0.2 10*3/uL — AB (ref 0.9–3.3)
LYMPH%: 4.2 % — ABNORMAL LOW (ref 14.0–49.7)
MCH: 31.4 pg (ref 25.1–34.0)
MCHC: 33.5 g/dL (ref 31.5–36.0)
MCV: 93.7 fL (ref 79.5–101.0)
MONO#: 0.1 10*3/uL (ref 0.1–0.9)
MONO%: 1.6 % (ref 0.0–14.0)
NEUT%: 93.8 % — ABNORMAL HIGH (ref 38.4–76.8)
NEUTROS ABS: 4.7 10*3/uL (ref 1.5–6.5)
Platelets: 163 10*3/uL (ref 145–400)
RBC: 3.47 10*6/uL — ABNORMAL LOW (ref 3.70–5.45)
RDW: 18 % — AB (ref 11.2–14.5)
WBC: 5 10*3/uL (ref 3.9–10.3)

## 2017-02-15 MED ORDER — SODIUM CHLORIDE 0.9 % IV SOLN
500.0000 mg/m2 | Freq: Once | INTRAVENOUS | Status: AC
  Administered 2017-02-15: 900 mg via INTRAVENOUS
  Filled 2017-02-15: qty 20

## 2017-02-15 MED ORDER — PROCHLORPERAZINE MALEATE 10 MG PO TABS
ORAL_TABLET | ORAL | Status: AC
  Filled 2017-02-15: qty 1

## 2017-02-15 MED ORDER — SODIUM CHLORIDE 0.9% FLUSH
10.0000 mL | INTRAVENOUS | Status: DC | PRN
  Administered 2017-02-15: 10 mL via INTRAVENOUS
  Filled 2017-02-15: qty 10

## 2017-02-15 MED ORDER — CYANOCOBALAMIN 1000 MCG/ML IJ SOLN
INTRAMUSCULAR | Status: AC
  Filled 2017-02-15: qty 1

## 2017-02-15 MED ORDER — CYANOCOBALAMIN 1000 MCG/ML IJ SOLN
1000.0000 ug | Freq: Once | INTRAMUSCULAR | Status: AC
  Administered 2017-02-15: 1000 ug via INTRAMUSCULAR

## 2017-02-15 MED ORDER — PROCHLORPERAZINE MALEATE 10 MG PO TABS
10.0000 mg | ORAL_TABLET | Freq: Once | ORAL | Status: AC
  Administered 2017-02-15: 10 mg via ORAL

## 2017-02-15 MED ORDER — SODIUM CHLORIDE 0.9 % IV SOLN
Freq: Once | INTRAVENOUS | Status: AC
  Administered 2017-02-15: 13:00:00 via INTRAVENOUS

## 2017-02-15 MED ORDER — SODIUM CHLORIDE 0.9% FLUSH
10.0000 mL | INTRAVENOUS | Status: DC | PRN
  Administered 2017-02-15: 10 mL
  Filled 2017-02-15: qty 10

## 2017-02-15 MED ORDER — HEPARIN SOD (PORK) LOCK FLUSH 100 UNIT/ML IV SOLN
500.0000 [IU] | Freq: Once | INTRAVENOUS | Status: AC | PRN
  Administered 2017-02-15: 500 [IU]
  Filled 2017-02-15: qty 5

## 2017-02-15 NOTE — Telephone Encounter (Signed)
Scheduled appt per 7/23 los - Gave patient AVS and calender per los. [facility] to contact patient with ct schedule.
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
## 2017-02-15 NOTE — Patient Instructions (Signed)
[facility] Discharge Instructions for Patients Receiving Chemotherapy  Today you received the following chemotherapy agents pemetrexed (Alimta)   To help prevent nausea and vomiting after your treatment, we encourage you to take your nausea medication as directed   If you develop nausea and vomiting that is not controlled by your nausea medication, call the clinic.   BELOW ARE SYMPTOMS THAT SHOULD BE REPORTED IMMEDIATELY:  *FEVER GREATER THAN 100.5 F  *CHILLS WITH OR WITHOUT FEVER  NAUSEA AND VOMITING THAT IS NOT CONTROLLED WITH YOUR NAUSEA MEDICATION  *UNUSUAL SHORTNESS OF BREATH  *UNUSUAL BRUISING OR BLEEDING  TENDERNESS IN MOUTH AND THROAT WITH OR WITHOUT PRESENCE OF ULCERS  *URINARY PROBLEMS  *BOWEL PROBLEMS  UNUSUAL RASH Items with * indicate a potential emergency and should be followed up as soon as possible.  Feel free to call the clinic you have any questions or concerns. The clinic phone number is [phone].  Please show the [facility] at check-in to the Emergency Department and triage nurse.

## 2017-02-15 NOTE — Patient Instructions (Signed)

## 2017-02-16 ENCOUNTER — Other Ambulatory Visit: Payer: Self-pay | Admitting: Internal Medicine

## 2017-02-25 ENCOUNTER — Ambulatory Visit (HOSPITAL_COMMUNITY)
Admission: RE | Admit: 2017-02-25 | Discharge: 2017-02-25 | Disposition: A | Discharge status: Home / Self Care | Payer: Medicare Other | Source: Ambulatory Visit | Attending: Internal Medicine | Admitting: Internal Medicine

## 2017-02-25 ENCOUNTER — Encounter (HOSPITAL_COMMUNITY): Payer: Self-pay

## 2017-02-25 DIAGNOSIS — K5730 Diverticulosis of large intestine without perforation or abscess without bleeding: Secondary | ICD-10-CM | POA: Insufficient documentation

## 2017-02-25 DIAGNOSIS — J432 Centrilobular emphysema: Secondary | ICD-10-CM | POA: Insufficient documentation

## 2017-02-25 DIAGNOSIS — I700 Atherosclerosis of aorta: Secondary | ICD-10-CM | POA: Diagnosis not present

## 2017-02-25 DIAGNOSIS — C801 Malignant (primary) neoplasm, unspecified: Secondary | ICD-10-CM

## 2017-02-25 DIAGNOSIS — I2510 Atherosclerotic heart disease of native coronary artery without angina pectoris: Secondary | ICD-10-CM | POA: Diagnosis not present

## 2017-02-25 DIAGNOSIS — C7972 Secondary malignant neoplasm of left adrenal gland: Secondary | ICD-10-CM | POA: Diagnosis not present

## 2017-02-25 DIAGNOSIS — C3491 Malignant neoplasm of unspecified part of right bronchus or lung: Secondary | ICD-10-CM | POA: Diagnosis present

## 2017-02-25 DIAGNOSIS — R918 Other nonspecific abnormal finding of lung field: Secondary | ICD-10-CM | POA: Diagnosis not present

## 2017-02-25 DIAGNOSIS — Z923 Personal history of irradiation: Secondary | ICD-10-CM | POA: Diagnosis not present

## 2017-02-25 DIAGNOSIS — Z5111 Encounter for antineoplastic chemotherapy: Secondary | ICD-10-CM

## 2017-02-25 MED ORDER — IOPAMIDOL (ISOVUE-300) INJECTION 61%
INTRAVENOUS | Status: AC
  Filled 2017-02-25: qty 100

## 2017-02-25 MED ORDER — IOPAMIDOL (ISOVUE-300) INJECTION 61%
100.0000 mL | Freq: Once | INTRAVENOUS | Status: AC | PRN
  Administered 2017-02-25: 100 mL via INTRAVENOUS

## 2017-02-26 ENCOUNTER — Other Ambulatory Visit: Payer: Self-pay | Admitting: Internal Medicine

## 2017-03-08 ENCOUNTER — Ambulatory Visit (HOSPITAL_BASED_OUTPATIENT_CLINIC_OR_DEPARTMENT_OTHER): Payer: Medicare Other | Admitting: Internal Medicine

## 2017-03-08 ENCOUNTER — Telehealth: Payer: Self-pay | Admitting: Internal Medicine

## 2017-03-08 ENCOUNTER — Other Ambulatory Visit (HOSPITAL_BASED_OUTPATIENT_CLINIC_OR_DEPARTMENT_OTHER): Payer: Medicare Other

## 2017-03-08 ENCOUNTER — Encounter: Payer: Self-pay | Admitting: Internal Medicine

## 2017-03-08 ENCOUNTER — Ambulatory Visit (HOSPITAL_BASED_OUTPATIENT_CLINIC_OR_DEPARTMENT_OTHER): Payer: Medicare Other

## 2017-03-08 VITALS — BP 110/97 | HR 76 | Temp 97.8°F | Resp 18 | Ht 62.0 in | Wt 165.2 lb

## 2017-03-08 DIAGNOSIS — C7931 Secondary malignant neoplasm of brain: Secondary | ICD-10-CM

## 2017-03-08 DIAGNOSIS — C3491 Malignant neoplasm of unspecified part of right bronchus or lung: Secondary | ICD-10-CM

## 2017-03-08 DIAGNOSIS — I63511 Cerebral infarction due to unspecified occlusion or stenosis of right middle cerebral artery: Secondary | ICD-10-CM

## 2017-03-08 DIAGNOSIS — C7972 Secondary malignant neoplasm of left adrenal gland: Secondary | ICD-10-CM

## 2017-03-08 DIAGNOSIS — C801 Malignant (primary) neoplasm, unspecified: Secondary | ICD-10-CM

## 2017-03-08 DIAGNOSIS — Z5111 Encounter for antineoplastic chemotherapy: Secondary | ICD-10-CM

## 2017-03-08 DIAGNOSIS — I10 Essential (primary) hypertension: Secondary | ICD-10-CM | POA: Diagnosis not present

## 2017-03-08 DIAGNOSIS — C3431 Malignant neoplasm of lower lobe, right bronchus or lung: Secondary | ICD-10-CM | POA: Diagnosis not present

## 2017-03-08 LAB — CBC WITH DIFFERENTIAL/PLATELET
BASO%: 0.2 % (ref 0.0–2.0)
BASOS ABS: 0 10*3/uL (ref 0.0–0.1)
EOS ABS: 0 10*3/uL (ref 0.0–0.5)
EOS%: 0 % (ref 0.0–7.0)
HEMATOCRIT: 36.9 % (ref 34.8–46.6)
HGB: 12.4 g/dL (ref 11.6–15.9)
LYMPH#: 0.4 10*3/uL — AB (ref 0.9–3.3)
LYMPH%: 4.4 % — ABNORMAL LOW (ref 14.0–49.7)
MCH: 32 pg (ref 25.1–34.0)
MCHC: 33.7 g/dL (ref 31.5–36.0)
MCV: 94.9 fL (ref 79.5–101.0)
MONO#: 0.5 10*3/uL (ref 0.1–0.9)
MONO%: 6.3 % (ref 0.0–14.0)
NEUT#: 7.5 10*3/uL — ABNORMAL HIGH (ref 1.5–6.5)
NEUT%: 89.1 % — AB (ref 38.4–76.8)
PLATELETS: 214 10*3/uL (ref 145–400)
RBC: 3.89 10*6/uL (ref 3.70–5.45)
RDW: 18.5 % — ABNORMAL HIGH (ref 11.2–14.5)
WBC: 8.5 10*3/uL (ref 3.9–10.3)

## 2017-03-08 LAB — COMPREHENSIVE METABOLIC PANEL
ALT: 35 U/L (ref 0–55)
AST: 28 U/L (ref 5–34)
Albumin: 3.6 g/dL (ref 3.5–5.0)
Alkaline Phosphatase: 33 U/L — ABNORMAL LOW (ref 40–150)
Anion Gap: 9 mEq/L (ref 3–11)
BUN: 15.9 mg/dL (ref 7.0–26.0)
CO2: 26 mEq/L (ref 22–29)
Calcium: 10.6 mg/dL — ABNORMAL HIGH (ref 8.4–10.4)
Chloride: 104 mEq/L (ref 98–109)
Creatinine: 0.7 mg/dL (ref 0.6–1.1)
EGFR: 80 mL/min/{1.73_m2} — ABNORMAL LOW (ref >90)
Glucose: 81 mg/dl (ref 70–140)
Potassium: 4.5 mEq/L (ref 3.5–5.1)
Sodium: 140 mEq/L (ref 136–145)
Total Bilirubin: 0.68 mg/dL (ref 0.20–1.20)
Total Protein: 6.7 g/dL (ref 6.4–8.3)

## 2017-03-08 MED ORDER — SODIUM CHLORIDE 0.9 % IV SOLN
Freq: Once | INTRAVENOUS | Status: AC
  Administered 2017-03-08: 14:00:00 via INTRAVENOUS

## 2017-03-08 MED ORDER — SODIUM CHLORIDE 0.9% FLUSH
10.0000 mL | INTRAVENOUS | Status: DC | PRN
  Administered 2017-03-08: 10 mL
  Filled 2017-03-08: qty 10

## 2017-03-08 MED ORDER — HEPARIN SOD (PORK) LOCK FLUSH 100 UNIT/ML IV SOLN
500.0000 [IU] | Freq: Once | INTRAVENOUS | Status: AC | PRN
  Administered 2017-03-08: 500 [IU]
  Filled 2017-03-08: qty 5

## 2017-03-08 MED ORDER — PROCHLORPERAZINE MALEATE 10 MG PO TABS
ORAL_TABLET | ORAL | Status: AC
  Filled 2017-03-08: qty 1

## 2017-03-08 MED ORDER — SODIUM CHLORIDE 0.9 % IV SOLN
500.0000 mg/m2 | Freq: Once | INTRAVENOUS | Status: AC
  Administered 2017-03-08: 900 mg via INTRAVENOUS
  Filled 2017-03-08: qty 20

## 2017-03-08 MED ORDER — PROCHLORPERAZINE MALEATE 10 MG PO TABS
10.0000 mg | ORAL_TABLET | Freq: Once | ORAL | Status: AC
  Administered 2017-03-08: 10 mg via ORAL

## 2017-03-08 NOTE — Telephone Encounter (Signed)
Gave patient avs and calendar for upcoming visits.

## 2017-03-08 NOTE — Patient Instructions (Signed)
[facility] Discharge Instructions for Patients Receiving Chemotherapy  Today you received the following chemotherapy agents pemetrexed (Alimta)   To help prevent nausea and vomiting after your treatment, we encourage you to take your nausea medication as directed   If you develop nausea and vomiting that is not controlled by your nausea medication, call the clinic.   BELOW ARE SYMPTOMS THAT SHOULD BE REPORTED IMMEDIATELY:  *FEVER GREATER THAN 100.5 F  *CHILLS WITH OR WITHOUT FEVER  NAUSEA AND VOMITING THAT IS NOT CONTROLLED WITH YOUR NAUSEA MEDICATION  *UNUSUAL SHORTNESS OF BREATH  *UNUSUAL BRUISING OR BLEEDING  TENDERNESS IN MOUTH AND THROAT WITH OR WITHOUT PRESENCE OF ULCERS  *URINARY PROBLEMS  *BOWEL PROBLEMS  UNUSUAL RASH Items with * indicate a potential emergency and should be followed up as soon as possible.  Feel free to call the clinic you have any questions or concerns. The clinic phone number is [phone].  Please show the [facility] at check-in to the Emergency Department and triage nurse.

## 2017-03-08 NOTE — Progress Notes (Signed)
[name]   [phone]  OFFICE PROGRESS NOTE  [name], MD [address] [place] [address]  DIAGNOSIS: Stage IV (T2a, N3, M1 B) non-small cell lung cancer, adenocarcinoma presented with right lower lobe lung mass, mediastinal and bilateral supraclavicular lymphadenopathy as well as metastatic disease to the left adrenal gland diagnosed in October 2017. The patient also develop multiple metastatic brain lesions in December 2017.  Genomic Alterations Identified? BRAF G469S CDKN2A p16INK4a deletion exons 1-2 and p14ARF deletion exon 2 KMT2C (MLL3) H5456* YB63 splice site 893T>D Additional Findings? Microsatellite status MS-Stable Tumor Mutation Burden TMB-Intermediate; 12 Muts/Mb Additional Disease-relevant Genes with No Reportable Alterations Identified? EGFR KRAS ALK MET RET ERBB2 ROS1  PRIOR THERAPY:  1) Concurrent chemoradiation with weekly carboplatin for AUC of 2 and paclitaxel 45 MG/M2 status post 6 cycles. 2) stereotactic radiotherapy to the left adrenal gland metastasis. 3) stereotactic radiotherapy to 4 brain lesions under the care of Dr. Tammi Klippel on 07/31/2016. 4) Systemic chemotherapy with carboplatin for AUC of 5, Alimta 500 MG/M2 and Avastin 15 MG/KG every 3 weeks. First dose 08/10/2016. Status post 6 cycles. Last cycle was given on 11/23/2016. Carboplatin was discontinued at cycle #5 secondary to hypersensitivity reaction.  CURRENT THERAPY: Maintenance systemic chemotherapy with single agent Alimta 500 MG/M2 every 3 weeks. First dose 01/04/2017. Status post 3 cycles.  INTERVAL HISTORY: Molly Black 76 y.o. female returns to the clinic today for follow-up visit. The patient is feeling fine today with no specific complaints. She is tolerating her current maintenance systemic chemotherapy with single agent Alimta fairly well. She denied having any nausea, vomiting, diarrhea or constipation.  She denied having any fever or chills. She denied having any headache or visual changes. The patient has no chest pain, shortness of breath, cough or hemoptysis. She had repeat CT scan of the chest, abdomen and pelvis performed recently and she is here for evaluation and discussion of her scan results.   MEDICAL HISTORY: Past Medical History:  Diagnosis Date  . Adenocarcinoma of right lung, stage 4 ([place]) 05/14/2016  . Antineoplastic chemotherapy induced anemia 2016-10-08  . Anxiety    with death of brother none since  . Carcinoma of breast, stage 2, estrogen receptor negative, right ([place])    T2N0 right breast mastectomy/TRAM reconstruction ER negative PR positive  June 1989  Then [facility] chemo  . Cystadenofibroma of ovary, unspecified laterality   . Diverticulosis of colon without diverticulitis   . Encounter for antineoplastic chemotherapy 06/01/2016  . Gastric ulcer   . GERD (gastroesophageal reflux disease)    takes Nexium daily  . Goals of care, counseling/discussion 08/03/2016  . H/O hiatal hernia   . Hemangioma of liver   . Hiatal hernia   . Metastasis to brain ([place]) dx'd 06/2016  . Neuropathy, peripheral   . Odynophagia 06/29/2016  . OSA on CPAP    setting 15- uses occ.  . Personal history of urinary (tract) infections   . Pneumonia    at age 59 years old  . PONV (postoperative nausea and vomiting)   . Schatzki's ring   . Seizures ([facility])   . Shortness of breath   . Skin burn 06/29/2016  . Skin cancer of face    "had some places frozen off" (04/13/2013)  . Tubular adenoma of colon     ALLERGIES:  is allergic to other.  MEDICATIONS:  Current Outpatient Prescriptions  Medication Sig Dispense Refill  . ALPRAZolam (  XANAX) 0.5 MG tablet Take 0.5 mg by mouth as needed.     Marland Kitchen amLODipine (NORVASC) 5 MG tablet Take 5 mg by mouth daily.     . beta carotene w/minerals (OCUVITE) tablet Take 1 tablet by mouth daily.    . Cyanocobalamin 2500 MCG TABS Take 1 tablet by mouth daily.    Marland Kitchen  dexamethasone (DECADRON) 4 MG tablet 4 mg by mouth twice a day the day before, day of and day after the chemotherapy every 3 weeks. (Patient taking differently: 2 (two) times daily. 4 mg by mouth twice a day the day before, day of and day after the chemotherapy every 3 weeks.) 40 tablet 1  . enoxaparin (LOVENOX) 120 MG/0.8ML injection     . feeding supplement (ENSURE CLINICAL STRENGTH) LIQD Take 237 mLs by mouth AC breakfast. Takes occasionally    . folic acid (FOLVITE) 1 MG tablet TAKE 1 TABLET BY MOUTH ONCE A DAY. 30 tablet 0  . Lacosamide (VIMPAT) 150 MG TABS Take 1 tablet (150 mg total) by mouth 2 (two) times daily. 60 tablet 11  . lidocaine-prilocaine (EMLA) cream Apply 1 application topically as needed. Apply 1-2  tsp over port site 1.5 -2 hours prior to chemotherapy. 30 g 0  . losartan (COZAAR) 50 MG tablet Take 50 mg by mouth daily.     Marland Kitchen omeprazole (PRILOSEC) 40 MG capsule Take 1 capsule (40 mg total) by mouth 2 (two) times daily. 30 minutes before breakfast and 30 minutes before dinner 180 capsule 1  . sucralfate (CARAFATE) 1 g tablet Take 1 tablet (1 g total) by mouth 4 (four) times daily. (Patient taking differently: Take 1 g by mouth 2 (two) times daily. ) 120 tablet 3  . hydrochlorothiazide (MICROZIDE) 12.5 MG capsule     . prochlorperazine (COMPAZINE) 10 MG tablet Take 10 mg by mouth every 6 (six) hours as needed.     No current facility-administered medications for this visit.     SURGICAL HISTORY:  Past Surgical History:  Procedure Laterality Date  . ABDOMINAL HYSTERECTOMY  1989  . ANKLE FRACTURE SURGERY Right ?1996  . BREAST BIOPSY Right 1963; 1981; 1989  . BREAST CAPSULECTOMY WITH IMPLANT EXCHANGE Right 4/53/6468   "silicon gel implant" (0/32/1224)  . BREAST IMPLANT REMOVAL Right 04/13/2013   Procedure: REMOVAL RIGHT RUPTURED BREAST IMPLANTS, DELAYED BREAST RECONSTRUCTION WITH SILICONE GEL IMPLANTS;  Surgeon: Crissie Reese, MD;  Location: [facility];  Service: Plastics;   Laterality: Right;  . BREAST LUMPECTOMY Right 1963; 1981; 1989   "benign; benign; malignant"  . BREAST RECONSTRUCTION Right   . BREAST RECONSTRUCTION WITH PLACEMENT OF TISSUE EXPANDER AND FLEX HD (ACELLULAR HYDRATED DERMIS) Right 1989  . CAPSULECTOMY Right 04/13/2013   Procedure: CAPSULECTOMY;  Surgeon: Crissie Reese, MD;  Location: [facility];  Service: Plastics;  Laterality: Right;  . CATARACT EXTRACTION W/PHACO Right 10/18/2012   Procedure: CATARACT EXTRACTION PHACO AND INTRAOCULAR LENS PLACEMENT (IOC);  Surgeon: Elta Guadeloupe T. Gershon Crane, MD;  Location: [facility];  Service: Ophthalmology;  Laterality: Right;  CDE:7.67  . CATARACT EXTRACTION W/PHACO Left 11/01/2012   Procedure: CATARACT EXTRACTION PHACO AND INTRAOCULAR LENS PLACEMENT (IOC);  Surgeon: Elta Guadeloupe T. Gershon Crane, MD;  Location: [facility];  Service: Ophthalmology;  Laterality: Left;  CDE:9.92  . CHOLECYSTECTOMY  1990's  . ESOPHAGOGASTRODUODENOSCOPY N/A 10/13/2013   Procedure: ESOPHAGOGASTRODUODENOSCOPY (EGD);  Surgeon: Jerene Bears, MD;  Location: [facility];  Service: Gastroenterology;  Laterality: N/A;  . IR GENERIC HISTORICAL  08/04/2016   IR FLUORO GUIDE PORT INSERTION  LEFT 08/04/2016 Jacqulynn Cadet, MD [facility]  . IR GENERIC HISTORICAL  08/04/2016   IR US GUIDE VASC ACCESS LEFT 08/04/2016 Jacqulynn Cadet, MD [facility]  . MASTECTOMY COMPLETE / SIMPLE W/ SENTINEL NODE BIOPSY Right 1989  . RECONSTRUCTION / CORRECTION OF NIPPLE / AEROLA Right 1989  . WRIST FRACTURE SURGERY Right ~ 2007   "put a pin in it" (04/13/2013)    REVIEW OF SYSTEMS:  Constitutional: positive for fatigue Eyes: negative Ears, nose, mouth, throat, and face: negative Respiratory: negative Cardiovascular: negative Gastrointestinal: negative Genitourinary:negative Integument/breast: negative Hematologic/lymphatic: negative Musculoskeletal:negative Neurological: negative Behavioral/Psych: negative Endocrine: negative Allergic/Immunologic: negative   PHYSICAL EXAMINATION:  General appearance: alert, cooperative, fatigued and no distress Head: Normocephalic, without obvious abnormality, atraumatic Neck: no adenopathy, no JVD, supple, symmetrical, trachea midline and thyroid not enlarged, symmetric, no tenderness/mass/nodules Lymph nodes: Cervical, supraclavicular, and axillary nodes normal. Resp: clear to auscultation bilaterally Back: symmetric, no curvature. ROM normal. No CVA tenderness. Cardio: regular rate and rhythm, S1, S2 normal, no murmur, click, rub or gallop GI: soft, non-tender; bowel sounds normal; no masses,  no organomegaly Extremities: extremities normal, atraumatic, no cyanosis or edema Neurologic: Alert and oriented X 3, normal strength and tone. Normal symmetric reflexes. Normal coordination and gait  ECOG PERFORMANCE STATUS: 1 - Symptomatic but completely ambulatory  Blood pressure (!) 110/97, pulse 76, temperature 97.8 F (36.6 C), temperature source Oral, resp. rate 18, height 5' 2"  (1.575 m), weight 165 lb 3.2 oz (74.9 kg), SpO2 98 %.  LABORATORY DATA: Lab Results  Component Value Date   WBC 8.5 03/08/2017   HGB 12.4 03/08/2017   HCT 36.9 03/08/2017   MCV 94.9 03/08/2017   PLT 214 03/08/2017      Chemistry      Component Value Date/Time   NA 140 03/08/2017 1041   K 4.5 03/08/2017 1041   CL 111 01/12/2017 0405   CO2 26 03/08/2017 1041   BUN 15.9 03/08/2017 1041   CREATININE 0.7 03/08/2017 1041      Component Value Date/Time   CALCIUM 10.6 (H) 03/08/2017 1041   ALKPHOS 33 (L) 03/08/2017 1041   AST 28 03/08/2017 1041   ALT 35 03/08/2017 1041   BILITOT 0.68 03/08/2017 1041       RADIOGRAPHIC STUDIES: Ct Chest W Contrast  Result Date: 02/25/2017 CLINICAL DATA:  76 year old female with history of lung cancer diagnosed in October 2017 with metastatic disease to the brain and left adrenal gland status post chemotherapy which is completed. Oral chemotherapy ongoing. Patient also has a history of right-sided breast cancer  diagnosed in 1988 and 2014 status post right mastectomy and chemotherapy. Followup study. EXAM: CT CHEST, ABDOMEN, AND PELVIS WITH CONTRAST TECHNIQUE: Multidetector CT imaging of the chest, abdomen and pelvis was performed following the standard protocol during bolus administration of intravenous contrast. CONTRAST:  173m ISOVUE-300 IOPAMIDOL (ISOVUE-300) INJECTION 61% COMPARISON:  CT of the chest, abdomen and pelvis 12/22/2016. FINDINGS: CT CHEST FINDINGS Cardiovascular: Heart size is normal. Trace amount of pericardial fluid and/or thickening again noted posteriorly adjacent to the left atrioventricular groove, stable compared to the prior study, likely a post treatment related change from prior radiation therapy. No associated pericardial calcification. There is aortic atherosclerosis, as well as atherosclerosis of the great vessels of the mediastinum and the coronary arteries, including calcified atherosclerotic plaque in the left anterior descending, left circumflex and right coronary arteries. Left-sided internal jugular single-lumen porta cath with tip terminating at the superior cavoatrial junction. Mediastinum/Nodes: Regression of previously noted mildly  enlarged mediastinal lymph nodes. On today's study, no pathologically enlarged mediastinal or hilar lymph nodes are noted. Small hiatal hernia. No axillary lymphadenopathy. Surgical clips in the right axillary region from prior lymph node dissection. Lungs/Pleura: Chronic areas of septal thickening and architectural distortion in the left lower lobe with some associated cylindrical and varicose bronchiectasis, very similar to the prior study from 12/22/2016, compatible with evolving postradiation changes. Likewise, some paramediastinal septal thickening and subpleural reticulation is also noted in the upper thorax on the right side, also sequela of prior radiation therapy. 1.6 cm ground-glass attenuation nodule in the superior segment of the left lower  lobe (axial image 53 of series 4) is stable. No other new suspicious appearing pulmonary nodules or masses are noted. Mild diffuse bronchial wall thickening with mild centrilobular and paraseptal emphysema. No acute consolidative airspace disease. No pleural effusions. Musculoskeletal: Status post right modified radical mastectomy with right-sided breast implant. There are no aggressive appearing lytic or blastic lesions noted in the visualized portions of the skeleton. CT ABDOMEN PELVIS FINDINGS Hepatobiliary: Multiple well-defined low-attenuation nonenhancing liver lesions are again noted, the largest of which is in segment 4B (axial image 53 of series 2) measuring 3.7 x 2.3 cm, compatible with simple hepatic cysts. Unusual appearing lesion with central low attenuation and peripheral enhancement with some progressive centripetal filling on delayed images in the right lobe of the liver between segments 6 and 7, stable on numerous prior examinations, presumably an atypical cavernous hemangioma. No new suspicious hepatic lesions are noted. No intra or extrahepatic biliary ductal dilatation. Status post cholecystectomy. Pancreas: No pancreatic mass. No pancreatic ductal dilatation. No pancreatic or peripancreatic fluid or inflammatory changes. Spleen: Unremarkable. 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 Bilateral kidneys and bilateral adrenal glands are otherwise normal in appearance. No hydroureteronephrosis. Urinary bladder is normal in appearance. Stomach/Bowel: Normal appearance of the stomach. No pathologic dilatation of small bowel or colon. 3 cm diverticulum extending off the medial aspect of the second portion of the duodenum incidentally noted. No surrounding inflammatory changes. Multiple colonic diverticulae are noted, particularly in the descending colon and sigmoid colon, without surrounding  inflammatory changes to suggest an acute diverticulitis at this time. The appendix is not confidently identified and may be surgically absent. Regardless, there are no inflammatory changes noted adjacent to the cecum to suggest the presence of an acute appendicitis at this time. Vascular/Lymphatic: Aortic atherosclerosis, without evidence of aneurysm or dissection in the abdominal or pelvic vasculature. No lymphadenopathy noted in the abdomen or pelvis. Reproductive: Status post hysterectomy. Ovaries are not confidently identified may be surgically absent or atrophic. Other: No significant volume of ascites.  No pneumoperitoneum. Musculoskeletal: 8 mm of anterolisthesis of L4 upon L5. There are no aggressive appearing lytic or blastic lesions noted in the visualized portions of the skeleton. IMPRESSION: 1. Evolving post treatment related changes of radiation fibrosis in the left lower lobe and paramediastinal aspect of the right upper lobe. No definite findings to suggest recurrent or metastatic disease. 2. Stable 1.6 cm ground-glass attenuation nodule in the superior segment of the left lower lobe. Continued attention on routine followup examinations is recommended to ensure stability. 3. Mild diffuse bronchial wall thickening with mild centrilobular and paraseptal emphysema; imaging findings suggestive of underlying COPD. 4. Aortic atherosclerosis, in addition to 3 vessel coronary artery disease. Assessment for potential risk factor modification, dietary therapy or pharmacologic therapy may be warranted, if clinically  indicated. 5. Colonic diverticulosis without evidence of acute diverticulitis at this time. 6. Additional incidental findings, similar prior studies, as above. Aortic Atherosclerosis (ICD10-I70.0) and Emphysema (ICD10-J43.9). Electronically Signed   By: Vinnie Langton M.D.   On: 02/25/2017 15:27   Ct Abdomen Pelvis W Contrast  Result Date: 02/25/2017 CLINICAL DATA:  76 year old female with  history of lung cancer diagnosed in October 2017 with metastatic disease to the brain and left adrenal gland status post chemotherapy which is completed. Oral chemotherapy ongoing. Patient also has a history of right-sided breast cancer diagnosed in 1988 and 2014 status post right mastectomy and chemotherapy. Followup study. EXAM: CT CHEST, ABDOMEN, AND PELVIS WITH CONTRAST TECHNIQUE: Multidetector CT imaging of the chest, abdomen and pelvis was performed following the standard protocol during bolus administration of intravenous contrast. CONTRAST:  157m ISOVUE-300 IOPAMIDOL (ISOVUE-300) INJECTION 61% COMPARISON:  CT of the chest, abdomen and pelvis 12/22/2016. FINDINGS: CT CHEST FINDINGS Cardiovascular: Heart size is normal. Trace amount of pericardial fluid and/or thickening again noted posteriorly adjacent to the left atrioventricular groove, stable compared to the prior study, likely a post treatment related change from prior radiation therapy. No associated pericardial calcification. There is aortic atherosclerosis, as well as atherosclerosis of the great vessels of the mediastinum and the coronary arteries, including calcified atherosclerotic plaque in the left anterior descending, left circumflex and right coronary arteries. Left-sided internal jugular single-lumen porta cath with tip terminating at the superior cavoatrial junction. Mediastinum/Nodes: Regression of previously noted mildly enlarged mediastinal lymph nodes. On today's study, no pathologically enlarged mediastinal or hilar lymph nodes are noted. Small hiatal hernia. No axillary lymphadenopathy. Surgical clips in the right axillary region from prior lymph node dissection. Lungs/Pleura: Chronic areas of septal thickening and architectural distortion in the left lower lobe with some associated cylindrical and varicose bronchiectasis, very similar to the prior study from 12/22/2016, compatible with evolving postradiation changes. Likewise, some  paramediastinal septal thickening and subpleural reticulation is also noted in the upper thorax on the right side, also sequela of prior radiation therapy. 1.6 cm ground-glass attenuation nodule in the superior segment of the left lower lobe (axial image 53 of series 4) is stable. No other new suspicious appearing pulmonary nodules or masses are noted. Mild diffuse bronchial wall thickening with mild centrilobular and paraseptal emphysema. No acute consolidative airspace disease. No pleural effusions. Musculoskeletal: Status post right modified radical mastectomy with right-sided breast implant. There are no aggressive appearing lytic or blastic lesions noted in the visualized portions of the skeleton. CT ABDOMEN PELVIS FINDINGS Hepatobiliary: Multiple well-defined low-attenuation nonenhancing liver lesions are again noted, the largest of which is in segment 4B (axial image 53 of series 2) measuring 3.7 x 2.3 cm, compatible with simple hepatic cysts. Unusual appearing lesion with central low attenuation and peripheral enhancement with some progressive centripetal filling on delayed images in the right lobe of the liver between segments 6 and 7, stable on numerous prior examinations, presumably an atypical cavernous hemangioma. No new suspicious hepatic lesions are noted. No intra or extrahepatic biliary ductal dilatation. Status post cholecystectomy. Pancreas: No pancreatic mass. No pancreatic ductal dilatation. No pancreatic or peripancreatic fluid or inflammatory changes. Spleen: Unremarkable. 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 Bilateral kidneys and bilateral adrenal glands are otherwise normal in appearance. No hydroureteronephrosis. Urinary bladder is normal in appearance. Stomach/Bowel: Normal appearance of the stomach. No pathologic dilatation of small bowel or colon. 3 cm  diverticulum extending  off the medial aspect of the second portion of the duodenum incidentally noted. No surrounding inflammatory changes. Multiple colonic diverticulae are noted, particularly in the descending colon and sigmoid colon, without surrounding inflammatory changes to suggest an acute diverticulitis at this time. The appendix is not confidently identified and may be surgically absent. Regardless, there are no inflammatory changes noted adjacent to the cecum to suggest the presence of an acute appendicitis at this time. Vascular/Lymphatic: Aortic atherosclerosis, without evidence of aneurysm or dissection in the abdominal or pelvic vasculature. No lymphadenopathy noted in the abdomen or pelvis. Reproductive: Status post hysterectomy. Ovaries are not confidently identified may be surgically absent or atrophic. Other: No significant volume of ascites.  No pneumoperitoneum. Musculoskeletal: 8 mm of anterolisthesis of L4 upon L5. There are no aggressive appearing lytic or blastic lesions noted in the visualized portions of the skeleton. IMPRESSION: 1. Evolving post treatment related changes of radiation fibrosis in the left lower lobe and paramediastinal aspect of the right upper lobe. No definite findings to suggest recurrent or metastatic disease. 2. Stable 1.6 cm ground-glass attenuation nodule in the superior segment of the left lower lobe. Continued attention on routine followup examinations is recommended to ensure stability. 3. Mild diffuse bronchial wall thickening with mild centrilobular and paraseptal emphysema; imaging findings suggestive of underlying COPD. 4. Aortic atherosclerosis, in addition to 3 vessel coronary artery disease. Assessment for potential risk factor modification, dietary therapy or pharmacologic therapy may be warranted, if clinically indicated. 5. Colonic diverticulosis without evidence of acute diverticulitis at this time. 6. Additional incidental findings, similar prior studies, as above. Aortic  Atherosclerosis (ICD10-I70.0) and Emphysema (ICD10-J43.9). Electronically Signed   By: Vinnie Langton M.D.   On: 02/25/2017 15:27    ASSESSMENT AND PLAN:  This is a very pleasant 76 years old white female with metastatic non-small cell lung cancer, adenocarcinoma with metastasis to the brain and adrenal gland. She underwent initial course of concurrent chemoradiation to the locally advanced disease in the chest as well as a stereotactic radiotherapy to the solitary adrenal lesions before she developed multiple brain metastases and she was treated with stereotactic radiotherapy to the brain lesions. The patient was treated with systemic chemotherapy with carboplatin, Alimta and Avastin status post 6 cycles. Avastin was discontinued at cycle #5 secondary to hypersensitivity reaction. She is currently on maintenance treatment with Alimta every 3 weeks is status post 3 cycles. She has been tolerating this treatment well. The patient had repeat CT scan of the chest, abdomen and pelvis performed recently. I personally and independently reviewed the scans and discuss the results with the patient and her sister. Her scan showed no evidence for disease progression.  I recommended for the patient to continue her current treatment with maintenance Alimta and she will proceed with cycle #4 today. For hypertension she will continue her current blood pressure medication with Norvasc, Cozaar and hydrochlorothiazide. For the history of deep venous thrombosis, the patient will continue her current treatment with Lovenox. The patient voices understanding of current disease status and treatment options and is in agreement with the current care plan. All questions were answered. The patient knows to call the clinic with any problems, questions or concerns. We can certainly see the patient much sooner if necessary.  Disclaimer: This note was dictated with voice recognition software. Similar sounding words can  inadvertently be transcribed and may not be corrected upon review.

## 2017-03-15 ENCOUNTER — Telehealth: Payer: Self-pay | Admitting: Neurology

## 2017-03-15 MED ORDER — LACOSAMIDE 150 MG PO TABS: 150.0000 mg | ORAL_TABLET | Freq: Two times a day (BID) | ORAL | 5 refills | Status: DC

## 2017-03-15 NOTE — Addendum Note (Signed)
Addended by: Rossie Muskrat L on: 03/15/2017 10:59 AM   Modules accepted: Orders

## 2017-03-15 NOTE — Telephone Encounter (Signed)
Called and spoke with Dorothea Ogle from [facility]. They did have vimpat on file from 09/03/16 with 11 refills. But since controlled, can only have 5 refills at a time. They need new rx sent. Advised AA,MD out of office. Rx refill will be coming from Dr Jannifer Franklin who is covering this am. He verbalized understanding.

## 2017-03-15 NOTE — Telephone Encounter (Signed)
Santiago Glad @ [facility] for pt: Lacosamide (VIMPAT) 150 MG TABS, pt was told by her pharmacy that they have not heard anything from our office about the refill of this medication.  Santiago Glad is asking pt be called with status re: if this medication will be refilled or changed. Please call pt

## 2017-03-15 NOTE — Telephone Encounter (Signed)
Faxed printed/signed rx to [facility]. Fax: [phone]. Received confirmation.

## 2017-03-15 NOTE — Telephone Encounter (Signed)
Called and LVM on cell number for pt informing her rx vimpat refilled. Tried home number, got busy signal, unable to LVM.

## 2017-03-16 NOTE — Telephone Encounter (Addendum)
Pt called the clinic said vimpat will cost her $321. Patient said she is confused, said she did not know anything about this RX until she got to pharmacy. She is confused as to whether she should take this medication. (Please see OV 01/19/17) Patient is aware Dr Jaynee Eagles is out of the office. I told her I would have RN call her for clarification.

## 2017-03-17 NOTE — Telephone Encounter (Signed)
Rn call patient about vimpat rx. Pt stated she is still taking the medication but got confuse because she needed a refill. Pt stated she has been paying the 321 dollars for the med monthly. Pt has a couple of pills for tonight. Rn advised pt to continue the medications until she sees Dr. Jaynee Eagles in 03/2017. Pt verbalized understanding and will pick up her rx tomorrow. Pt is thinking about finding a cheaper pharmacy.

## 2017-03-22 ENCOUNTER — Other Ambulatory Visit: Payer: Self-pay | Admitting: Internal Medicine

## 2017-03-30 ENCOUNTER — Ambulatory Visit (HOSPITAL_BASED_OUTPATIENT_CLINIC_OR_DEPARTMENT_OTHER): Payer: Medicare Other | Admitting: Internal Medicine

## 2017-03-30 ENCOUNTER — Ambulatory Visit (HOSPITAL_BASED_OUTPATIENT_CLINIC_OR_DEPARTMENT_OTHER): Payer: Medicare Other

## 2017-03-30 ENCOUNTER — Encounter: Payer: Self-pay | Admitting: Internal Medicine

## 2017-03-30 ENCOUNTER — Ambulatory Visit: Payer: Medicare Other

## 2017-03-30 ENCOUNTER — Other Ambulatory Visit: Payer: Medicare Other

## 2017-03-30 VITALS — BP 129/67 | HR 73 | Temp 97.9°F | Resp 18 | Wt 167.5 lb

## 2017-03-30 DIAGNOSIS — C3491 Malignant neoplasm of unspecified part of right bronchus or lung: Secondary | ICD-10-CM

## 2017-03-30 DIAGNOSIS — C7972 Secondary malignant neoplasm of left adrenal gland: Secondary | ICD-10-CM

## 2017-03-30 DIAGNOSIS — Z95828 Presence of other vascular implants and grafts: Secondary | ICD-10-CM | POA: Insufficient documentation

## 2017-03-30 DIAGNOSIS — C7931 Secondary malignant neoplasm of brain: Secondary | ICD-10-CM

## 2017-03-30 DIAGNOSIS — C3431 Malignant neoplasm of lower lobe, right bronchus or lung: Secondary | ICD-10-CM

## 2017-03-30 DIAGNOSIS — Z5111 Encounter for antineoplastic chemotherapy: Secondary | ICD-10-CM

## 2017-03-30 DIAGNOSIS — C801 Malignant (primary) neoplasm, unspecified: Secondary | ICD-10-CM

## 2017-03-30 LAB — CBC WITH DIFFERENTIAL/PLATELET
BASO%: 0.1 % (ref 0.0–2.0)
BASOS ABS: 0 10*3/uL (ref 0.0–0.1)
EOS ABS: 0 10*3/uL (ref 0.0–0.5)
EOS%: 0.1 % (ref 0.0–7.0)
HEMATOCRIT: 35.2 % (ref 34.8–46.6)
HEMOGLOBIN: 12.1 g/dL (ref 11.6–15.9)
LYMPH#: 0.3 10*3/uL — AB (ref 0.9–3.3)
LYMPH%: 4.9 % — ABNORMAL LOW (ref 14.0–49.7)
MCH: 32.4 pg (ref 25.1–34.0)
MCHC: 34.4 g/dL (ref 31.5–36.0)
MCV: 94 fL (ref 79.5–101.0)
MONO#: 0.3 10*3/uL (ref 0.1–0.9)
MONO%: 5.4 % (ref 0.0–14.0)
NEUT#: 5.4 10*3/uL (ref 1.5–6.5)
NEUT%: 89.5 % — AB (ref 38.4–76.8)
PLATELETS: 196 10*3/uL (ref 145–400)
RBC: 3.74 10*6/uL (ref 3.70–5.45)
RDW: 17.7 % — ABNORMAL HIGH (ref 11.2–14.5)
WBC: 6.1 10*3/uL (ref 3.9–10.3)

## 2017-03-30 LAB — COMPREHENSIVE METABOLIC PANEL
ALBUMIN: 3.4 g/dL — AB (ref 3.5–5.0)
ALK PHOS: 33 U/L — AB (ref 40–150)
ALT: 29 U/L (ref 0–55)
ANION GAP: 6 meq/L (ref 3–11)
AST: 27 U/L (ref 5–34)
BILIRUBIN TOTAL: 0.68 mg/dL (ref 0.20–1.20)
BUN: 19 mg/dL (ref 7.0–26.0)
CO2: 26 mEq/L (ref 22–29)
Calcium: 10.2 mg/dL (ref 8.4–10.4)
Chloride: 106 mEq/L (ref 98–109)
Creatinine: 0.7 mg/dL (ref 0.6–1.1)
EGFR: 86 mL/min/{1.73_m2} — AB (ref >90)
Glucose: 93 mg/dl (ref 70–140)
Potassium: 4.5 mEq/L (ref 3.5–5.1)
Sodium: 138 mEq/L (ref 136–145)
Total Protein: 6.4 g/dL (ref 6.4–8.3)

## 2017-03-30 MED ORDER — SODIUM CHLORIDE 0.9% FLUSH
10.0000 mL | INTRAVENOUS | Status: DC | PRN
  Administered 2017-03-30: 10 mL via INTRAVENOUS
  Filled 2017-03-30: qty 10

## 2017-03-30 MED ORDER — PROCHLORPERAZINE MALEATE 10 MG PO TABS
ORAL_TABLET | ORAL | Status: AC
  Filled 2017-03-30: qty 1

## 2017-03-30 MED ORDER — PEMETREXED DISODIUM CHEMO INJECTION 500 MG
500.0000 mg/m2 | Freq: Once | INTRAVENOUS | Status: AC
  Administered 2017-03-30: 900 mg via INTRAVENOUS
  Filled 2017-03-30: qty 20

## 2017-03-30 MED ORDER — SODIUM CHLORIDE 0.9% FLUSH
10.0000 mL | INTRAVENOUS | Status: DC | PRN
  Administered 2017-03-30: 10 mL
  Filled 2017-03-30: qty 10

## 2017-03-30 MED ORDER — SODIUM CHLORIDE 0.9 % IV SOLN
Freq: Once | INTRAVENOUS | Status: AC
  Administered 2017-03-30: 13:00:00 via INTRAVENOUS

## 2017-03-30 MED ORDER — PROCHLORPERAZINE MALEATE 10 MG PO TABS
10.0000 mg | ORAL_TABLET | Freq: Once | ORAL | Status: AC
  Administered 2017-03-30: 10 mg via ORAL

## 2017-03-30 MED ORDER — HEPARIN SOD (PORK) LOCK FLUSH 100 UNIT/ML IV SOLN
500.0000 [IU] | Freq: Once | INTRAVENOUS | Status: AC | PRN
  Administered 2017-03-30: 500 [IU]
  Filled 2017-03-30: qty 5
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
## 2017-03-30 NOTE — Patient Instructions (Signed)
[facility] Discharge Instructions for Patients Receiving Chemotherapy  Today you received the following chemotherapy agents pemetrexed (Alimta)   To help prevent nausea and vomiting after your treatment, we encourage you to take your nausea medication as directed   If you develop nausea and vomiting that is not controlled by your nausea medication, call the clinic.   BELOW ARE SYMPTOMS THAT SHOULD BE REPORTED IMMEDIATELY:  *FEVER GREATER THAN 100.5 F  *CHILLS WITH OR WITHOUT FEVER  NAUSEA AND VOMITING THAT IS NOT CONTROLLED WITH YOUR NAUSEA MEDICATION  *UNUSUAL SHORTNESS OF BREATH  *UNUSUAL BRUISING OR BLEEDING  TENDERNESS IN MOUTH AND THROAT WITH OR WITHOUT PRESENCE OF ULCERS  *URINARY PROBLEMS  *BOWEL PROBLEMS  UNUSUAL RASH Items with * indicate a potential emergency and should be followed up as soon as possible.  Feel free to call the clinic you have any questions or concerns. The clinic phone number is [phone].  Please show the [facility] at check-in to the Emergency Department and triage nurse.

## 2017-04-19 ENCOUNTER — Other Ambulatory Visit (HOSPITAL_BASED_OUTPATIENT_CLINIC_OR_DEPARTMENT_OTHER): Payer: Medicare Other

## 2017-04-19 ENCOUNTER — Ambulatory Visit (HOSPITAL_BASED_OUTPATIENT_CLINIC_OR_DEPARTMENT_OTHER): Payer: Medicare Other | Admitting: Internal Medicine

## 2017-04-19 ENCOUNTER — Telehealth: Payer: Self-pay | Admitting: Internal Medicine

## 2017-04-19 ENCOUNTER — Ambulatory Visit (HOSPITAL_BASED_OUTPATIENT_CLINIC_OR_DEPARTMENT_OTHER): Payer: Medicare Other

## 2017-04-19 ENCOUNTER — Other Ambulatory Visit: Payer: Self-pay | Admitting: Radiation Therapy

## 2017-04-19 ENCOUNTER — Encounter: Payer: Self-pay | Admitting: Internal Medicine

## 2017-04-19 ENCOUNTER — Ambulatory Visit: Payer: Medicare Other

## 2017-04-19 VITALS — BP 136/72 | HR 72 | Temp 98.8°F | Resp 18 | Ht 62.0 in | Wt 168.8 lb

## 2017-04-19 DIAGNOSIS — C3431 Malignant neoplasm of lower lobe, right bronchus or lung: Secondary | ICD-10-CM

## 2017-04-19 DIAGNOSIS — C7931 Secondary malignant neoplasm of brain: Secondary | ICD-10-CM

## 2017-04-19 DIAGNOSIS — C3491 Malignant neoplasm of unspecified part of right bronchus or lung: Secondary | ICD-10-CM

## 2017-04-19 DIAGNOSIS — Z5111 Encounter for antineoplastic chemotherapy: Secondary | ICD-10-CM | POA: Diagnosis not present

## 2017-04-19 DIAGNOSIS — C801 Malignant (primary) neoplasm, unspecified: Secondary | ICD-10-CM

## 2017-04-19 DIAGNOSIS — C7972 Secondary malignant neoplasm of left adrenal gland: Secondary | ICD-10-CM

## 2017-04-19 DIAGNOSIS — C3490 Malignant neoplasm of unspecified part of unspecified bronchus or lung: Secondary | ICD-10-CM

## 2017-04-19 DIAGNOSIS — Z95828 Presence of other vascular implants and grafts: Secondary | ICD-10-CM

## 2017-04-19 LAB — CBC WITH DIFFERENTIAL/PLATELET
BASO%: 0 % (ref 0.0–2.0)
BASOS ABS: 0 10*3/uL (ref 0.0–0.1)
EOS ABS: 0 10*3/uL (ref 0.0–0.5)
EOS%: 0 % (ref 0.0–7.0)
HCT: 35 % (ref 34.8–46.6)
HGB: 11.8 g/dL (ref 11.6–15.9)
LYMPH%: 7.8 % — AB (ref 14.0–49.7)
MCH: 32.3 pg (ref 25.1–34.0)
MCHC: 33.7 g/dL (ref 31.5–36.0)
MCV: 95.9 fL (ref 79.5–101.0)
MONO#: 0.1 10*3/uL (ref 0.1–0.9)
MONO%: 1.5 % (ref 0.0–14.0)
NEUT%: 90.7 % — AB (ref 38.4–76.8)
NEUTROS ABS: 6.7 10*3/uL — AB (ref 1.5–6.5)
PLATELETS: 183 10*3/uL (ref 145–400)
RBC: 3.65 10*6/uL — AB (ref 3.70–5.45)
RDW: 16.7 % — ABNORMAL HIGH (ref 11.2–14.5)
WBC: 7.4 10*3/uL (ref 3.9–10.3)
lymph#: 0.6 10*3/uL — ABNORMAL LOW (ref 0.9–3.3)

## 2017-04-19 LAB — COMPREHENSIVE METABOLIC PANEL
ALT: 21 U/L (ref 0–55)
ANION GAP: 8 meq/L (ref 3–11)
AST: 23 U/L (ref 5–34)
Albumin: 3.4 g/dL — ABNORMAL LOW (ref 3.5–5.0)
Alkaline Phosphatase: 33 U/L — ABNORMAL LOW (ref 40–150)
BILIRUBIN TOTAL: 0.82 mg/dL (ref 0.20–1.20)
BUN: 19.3 mg/dL (ref 7.0–26.0)
CO2: 24 meq/L (ref 22–29)
Calcium: 10 mg/dL (ref 8.4–10.4)
Chloride: 109 mEq/L (ref 98–109)
Creatinine: 0.7 mg/dL (ref 0.6–1.1)
EGFR: 80 mL/min/{1.73_m2} — ABNORMAL LOW (ref >90)
GLUCOSE: 105 mg/dL (ref 70–140)
POTASSIUM: 4.3 meq/L (ref 3.5–5.1)
SODIUM: 141 meq/L (ref 136–145)
TOTAL PROTEIN: 6.3 g/dL — AB (ref 6.4–8.3)

## 2017-04-19 MED ORDER — HEPARIN SOD (PORK) LOCK FLUSH 100 UNIT/ML IV SOLN
500.0000 [IU] | Freq: Once | INTRAVENOUS | Status: AC | PRN
  Administered 2017-04-19: 500 [IU]
  Filled 2017-04-19: qty 5

## 2017-04-19 MED ORDER — CYANOCOBALAMIN 1000 MCG/ML IJ SOLN
INTRAMUSCULAR | Status: AC
  Filled 2017-04-19: qty 1

## 2017-04-19 MED ORDER — SODIUM CHLORIDE 0.9% FLUSH
10.0000 mL | INTRAVENOUS | Status: DC | PRN
  Administered 2017-04-19: 10 mL
  Filled 2017-04-19: qty 10

## 2017-04-19 MED ORDER — PROCHLORPERAZINE MALEATE 10 MG PO TABS
10.0000 mg | ORAL_TABLET | Freq: Once | ORAL | Status: AC
  Administered 2017-04-19: 10 mg via ORAL

## 2017-04-19 MED ORDER — PEMETREXED DISODIUM CHEMO INJECTION 500 MG
500.0000 mg/m2 | Freq: Once | INTRAVENOUS | Status: AC
  Administered 2017-04-19: 900 mg via INTRAVENOUS
  Filled 2017-04-19: qty 20

## 2017-04-19 MED ORDER — SODIUM CHLORIDE 0.9 % IV SOLN
Freq: Once | INTRAVENOUS | Status: AC
  Administered 2017-04-19: 13:00:00 via INTRAVENOUS

## 2017-04-19 MED ORDER — SODIUM CHLORIDE 0.9% FLUSH
10.0000 mL | INTRAVENOUS | Status: DC | PRN
  Administered 2017-04-19: 10 mL via INTRAVENOUS
  Filled 2017-04-19: qty 10

## 2017-04-19 MED ORDER — CYANOCOBALAMIN 1000 MCG/ML IJ SOLN
1000.0000 ug | Freq: Once | INTRAMUSCULAR | Status: AC
  Administered 2017-04-19: 1000 ug via INTRAMUSCULAR

## 2017-04-19 MED ORDER — PROCHLORPERAZINE MALEATE 10 MG PO TABS
ORAL_TABLET | ORAL | Status: AC
  Filled 2017-04-19: qty 1
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
## 2017-04-19 NOTE — Patient Instructions (Signed)
[facility] Discharge Instructions for Patients Receiving Chemotherapy  Today you received the following chemotherapy agents Alimta.    To help prevent nausea and vomiting after your treatment, we encourage you to take your nausea medication as prescribed.    If you develop nausea and vomiting that is not controlled by your nausea medication, call the clinic.   BELOW ARE SYMPTOMS THAT SHOULD BE REPORTED IMMEDIATELY:  *FEVER GREATER THAN 100.5 F  *CHILLS WITH OR WITHOUT FEVER  NAUSEA AND VOMITING THAT IS NOT CONTROLLED WITH YOUR NAUSEA MEDICATION  *UNUSUAL SHORTNESS OF BREATH  *UNUSUAL BRUISING OR BLEEDING  TENDERNESS IN MOUTH AND THROAT WITH OR WITHOUT PRESENCE OF ULCERS  *URINARY PROBLEMS  *BOWEL PROBLEMS  UNUSUAL RASH Items with * indicate a potential emergency and should be followed up as soon as possible.  Feel free to call the clinic should you have any questions or concerns. The clinic phone number is [phone].  Please show the [facility] at check-in to the Emergency Department and triage nurse.

## 2017-04-19 NOTE — Telephone Encounter (Signed)
Gave patient AVS and calendar of upcoming October appointments °

## 2017-04-21 ENCOUNTER — Ambulatory Visit (INDEPENDENT_AMBULATORY_CARE_PROVIDER_SITE_OTHER): Payer: Medicare Other | Admitting: Neurology

## 2017-04-21 DIAGNOSIS — G40109 Localization-related (focal) (partial) symptomatic epilepsy and epileptic syndromes with simple partial seizures, not intractable, without status epilepticus: Secondary | ICD-10-CM

## 2017-04-21 DIAGNOSIS — E889 Metabolic disorder, unspecified: Secondary | ICD-10-CM

## 2017-04-21 DIAGNOSIS — R569 Unspecified convulsions: Secondary | ICD-10-CM

## 2017-04-21 NOTE — Procedures (Signed)
    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 The patient has non-small cell lung cancer. The patient is being evaluated for this issue.  This is a routine EEG. No skull defects are noted. Medications include Xanax, Norvasc, Decadron, folic acid, Vimpat, Cozaar, Prilosec, Compazine, and Carafate.   EEG classification: Normal awake  Description of the recording: The background rhythms of this recording consists of a fairly well modulated medium amplitude alpha rhythm of 10 Hz that is reactive to eye opening and closure. As the record progresses, the patient appears to remain in the waking state throughout the recording. Photic stimulation was performed, resulting in a bilateral and symmetric photic driving response. Hyperventilation was also performed, resulting in a minimal buildup of the background rhythm activities without significant slowing seen. At no time during the recording does there appear to be evidence of spike or spike wave discharges or evidence of focal slowing. EKG monitor shows no evidence of cardiac rhythm abnormalities with a heart rate of 78.  Impression: This is a normal EEG recording in the waking state. No evidence of ictal or interictal discharges are seen.

## 2017-04-23 ENCOUNTER — Telehealth: Payer: Self-pay | Admitting: *Deleted

## 2017-04-23 NOTE — Telephone Encounter (Signed)
Spoke with patient and informed her that her EEG was normal. She verbalized understanding, stated she just picked up her medication. She stated that Dr Jaynee Eagles had discussed possibly reducing her dose. She stated she has a FU in Oct so she asked if she should take medication as prescribed until then. This RN advised her to continue with current dose until FU in Oct. Advised she call before with any questions, problems. She verbalized understanding, appreciation, had no further questions.

## 2017-04-27 ENCOUNTER — Other Ambulatory Visit: Payer: Self-pay | Admitting: Internal Medicine

## 2017-05-04 NOTE — Progress Notes (Addendum)
   Molly Black. [place]40 y.o. 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   Headache:Denies headache Dizziness:Denies dizzines Nausea/vomiting: Diplopia:No Ringing in ears:No Visual changes:Right eye will swell at times;happened last a few ago. Fatigue:Denies fatigue.  Cognitive changes:Alert and oriented x three.  Very talkative today. No problems with short and long term memory. Taking Decadron 4 mg the day before the day of and the day after chemotherapy no sign of thrush. Weight: Wt Readings from Last 3 Encounters:  04/19/17 168 lb 12.8 oz (76.6 kg)  03/30/17 167 lb 8 oz (76 kg)  03/08/17 165 lb 3.2 oz (74.9 kg)    Imaging:10--18 MRI brain w wo contrast Lab:04-19-17 Cmet,CBC w diff   04-19-17 Saw Dr. Julien Nordmann The patient was treated with systemic chemotherapy with carboplatin, Alimta and Avastin status post 5 cycles. Avastin was discontinued at cycle #5 secondary to hypersensitivity reaction.   I recommended for the patient to proceed with cycle #6 of single agent Alimta today.   I will see her back for follow-up visit in 3 weeks for evaluation after repeating CT scan of the chest, abdomen and pelvis for restaging of her disease. BP (!) 138/98   Pulse 91   Temp 97.7 F (36.5 C) (Oral)   Resp 18   Ht 5\' 2"  (1.575 m)   Wt 168 lb 12.8 oz (76.6 kg)   SpO2 100%   BMI 30.87 kg/m

## 2017-05-06 ENCOUNTER — Ambulatory Visit (HOSPITAL_COMMUNITY)
Admission: RE | Admit: 2017-05-06 | Discharge: 2017-05-06 | Disposition: A | Discharge status: Home / Self Care | Payer: Medicare Other | Source: Ambulatory Visit | Attending: Radiation Oncology | Admitting: Radiation Oncology

## 2017-05-06 DIAGNOSIS — C7931 Secondary malignant neoplasm of brain: Secondary | ICD-10-CM | POA: Diagnosis present

## 2017-05-06 MED ORDER — GADOBENATE DIMEGLUMINE 529 MG/ML IV SOLN
16.0000 mL | Freq: Once | INTRAVENOUS | Status: AC | PRN
  Administered 2017-05-06: 16 mL via INTRAVENOUS

## 2017-05-07 ENCOUNTER — Encounter (HOSPITAL_COMMUNITY): Payer: Self-pay

## 2017-05-07 ENCOUNTER — Ambulatory Visit (HOSPITAL_COMMUNITY)
Admission: RE | Admit: 2017-05-07 | Discharge: 2017-05-07 | Disposition: A | Discharge status: Home / Self Care | Payer: Medicare Other | Source: Ambulatory Visit | Attending: Internal Medicine | Admitting: Internal Medicine

## 2017-05-07 ENCOUNTER — Other Ambulatory Visit: Payer: Self-pay | Admitting: *Deleted

## 2017-05-07 DIAGNOSIS — Z923 Personal history of irradiation: Secondary | ICD-10-CM | POA: Diagnosis not present

## 2017-05-07 DIAGNOSIS — I700 Atherosclerosis of aorta: Secondary | ICD-10-CM | POA: Diagnosis not present

## 2017-05-07 DIAGNOSIS — K429 Umbilical hernia without obstruction or gangrene: Secondary | ICD-10-CM | POA: Diagnosis not present

## 2017-05-07 DIAGNOSIS — C7972 Secondary malignant neoplasm of left adrenal gland: Secondary | ICD-10-CM | POA: Insufficient documentation

## 2017-05-07 DIAGNOSIS — R918 Other nonspecific abnormal finding of lung field: Secondary | ICD-10-CM | POA: Diagnosis not present

## 2017-05-07 DIAGNOSIS — M4316 Spondylolisthesis, lumbar region: Secondary | ICD-10-CM | POA: Insufficient documentation

## 2017-05-07 DIAGNOSIS — K7689 Other specified diseases of liver: Secondary | ICD-10-CM | POA: Insufficient documentation

## 2017-05-07 DIAGNOSIS — J432 Centrilobular emphysema: Secondary | ICD-10-CM | POA: Diagnosis not present

## 2017-05-07 DIAGNOSIS — C3491 Malignant neoplasm of unspecified part of right bronchus or lung: Secondary | ICD-10-CM

## 2017-05-07 DIAGNOSIS — Z5111 Encounter for antineoplastic chemotherapy: Secondary | ICD-10-CM

## 2017-05-07 DIAGNOSIS — C3490 Malignant neoplasm of unspecified part of unspecified bronchus or lung: Secondary | ICD-10-CM

## 2017-05-07 DIAGNOSIS — D1803 Hemangioma of intra-abdominal structures: Secondary | ICD-10-CM | POA: Diagnosis not present

## 2017-05-07 DIAGNOSIS — I313 Pericardial effusion (noninflammatory): Secondary | ICD-10-CM | POA: Insufficient documentation

## 2017-05-07 DIAGNOSIS — I2510 Atherosclerotic heart disease of native coronary artery without angina pectoris: Secondary | ICD-10-CM | POA: Diagnosis not present

## 2017-05-07 DIAGNOSIS — C50911 Malignant neoplasm of unspecified site of right female breast: Secondary | ICD-10-CM

## 2017-05-07 DIAGNOSIS — Z171 Estrogen receptor negative status [ER-]: Principal | ICD-10-CM

## 2017-05-07 DIAGNOSIS — K5750 Diverticulosis of both small and large intestine without perforation or abscess without bleeding: Secondary | ICD-10-CM | POA: Diagnosis not present

## 2017-05-07 MED ORDER — IOPAMIDOL (ISOVUE-300) INJECTION 61%
INTRAVENOUS | Status: AC
  Administered 2017-05-07: 100 mL via INTRAVENOUS
  Filled 2017-05-07: qty 100

## 2017-05-07 MED ORDER — IOPAMIDOL (ISOVUE-300) INJECTION 61%
100.0000 mL | Freq: Once | INTRAVENOUS | Status: AC | PRN
  Administered 2017-05-07: 100 mL via INTRAVENOUS

## 2017-05-10 ENCOUNTER — Ambulatory Visit (HOSPITAL_BASED_OUTPATIENT_CLINIC_OR_DEPARTMENT_OTHER): Payer: Medicare Other | Admitting: Internal Medicine

## 2017-05-10 ENCOUNTER — Ambulatory Visit (HOSPITAL_BASED_OUTPATIENT_CLINIC_OR_DEPARTMENT_OTHER): Payer: Medicare Other

## 2017-05-10 ENCOUNTER — Other Ambulatory Visit (HOSPITAL_BASED_OUTPATIENT_CLINIC_OR_DEPARTMENT_OTHER): Payer: Medicare Other

## 2017-05-10 ENCOUNTER — Ambulatory Visit: Payer: Medicare Other

## 2017-05-10 ENCOUNTER — Other Ambulatory Visit: Payer: Medicare Other

## 2017-05-10 ENCOUNTER — Encounter: Payer: Self-pay | Admitting: Radiation Oncology

## 2017-05-10 ENCOUNTER — Ambulatory Visit
Admission: RE | Admit: 2017-05-10 | Discharge: 2017-05-10 | Disposition: A | Discharge status: Home / Self Care | Payer: Medicare Other | Source: Ambulatory Visit | Attending: Urology | Admitting: Urology

## 2017-05-10 ENCOUNTER — Encounter: Payer: Self-pay | Admitting: Internal Medicine

## 2017-05-10 VITALS — BP 153/86 | HR 96 | Temp 97.7°F | Resp 18 | Ht 62.0 in | Wt 169.9 lb

## 2017-05-10 VITALS — BP 138/98 | HR 91 | Temp 97.7°F | Resp 18 | Ht 62.0 in | Wt 168.8 lb

## 2017-05-10 DIAGNOSIS — C3491 Malignant neoplasm of unspecified part of right bronchus or lung: Secondary | ICD-10-CM

## 2017-05-10 DIAGNOSIS — Z171 Estrogen receptor negative status [ER-]: Principal | ICD-10-CM

## 2017-05-10 DIAGNOSIS — C3431 Malignant neoplasm of lower lobe, right bronchus or lung: Secondary | ICD-10-CM

## 2017-05-10 DIAGNOSIS — C7971 Secondary malignant neoplasm of right adrenal gland: Secondary | ICD-10-CM | POA: Insufficient documentation

## 2017-05-10 DIAGNOSIS — I10 Essential (primary) hypertension: Secondary | ICD-10-CM

## 2017-05-10 DIAGNOSIS — Z9221 Personal history of antineoplastic chemotherapy: Secondary | ICD-10-CM | POA: Insufficient documentation

## 2017-05-10 DIAGNOSIS — C7972 Secondary malignant neoplasm of left adrenal gland: Secondary | ICD-10-CM | POA: Diagnosis not present

## 2017-05-10 DIAGNOSIS — C7931 Secondary malignant neoplasm of brain: Secondary | ICD-10-CM

## 2017-05-10 DIAGNOSIS — Z79899 Other long term (current) drug therapy: Secondary | ICD-10-CM | POA: Insufficient documentation

## 2017-05-10 DIAGNOSIS — Z87440 Personal history of urinary (tract) infections: Secondary | ICD-10-CM | POA: Insufficient documentation

## 2017-05-10 DIAGNOSIS — Z5111 Encounter for antineoplastic chemotherapy: Secondary | ICD-10-CM

## 2017-05-10 DIAGNOSIS — Z923 Personal history of irradiation: Secondary | ICD-10-CM | POA: Insufficient documentation

## 2017-05-10 DIAGNOSIS — C50911 Malignant neoplasm of unspecified site of right female breast: Secondary | ICD-10-CM

## 2017-05-10 DIAGNOSIS — C801 Malignant (primary) neoplasm, unspecified: Secondary | ICD-10-CM

## 2017-05-10 LAB — COMPREHENSIVE METABOLIC PANEL
ALBUMIN: 3.5 g/dL (ref 3.5–5.0)
ALK PHOS: 41 U/L (ref 40–150)
ALT: 26 U/L (ref 0–55)
AST: 26 U/L (ref 5–34)
Anion Gap: 8 mEq/L (ref 3–11)
BUN: 18 mg/dL (ref 7.0–26.0)
CO2: 23 meq/L (ref 22–29)
Calcium: 10 mg/dL (ref 8.4–10.4)
Chloride: 108 mEq/L (ref 98–109)
Creatinine: 0.7 mg/dL (ref 0.6–1.1)
EGFR: >60 mL/min/{1.73_m2} (ref >60)
GLUCOSE: 108 mg/dL (ref 70–140)
POTASSIUM: 4.3 meq/L (ref 3.5–5.1)
SODIUM: 140 meq/L (ref 136–145)
TOTAL PROTEIN: 6.5 g/dL (ref 6.4–8.3)
Total Bilirubin: 0.8 mg/dL (ref 0.20–1.20)

## 2017-05-10 LAB — CBC WITH DIFFERENTIAL/PLATELET
BASO%: 0 % (ref 0.0–2.0)
Basophils Absolute: 0 10*3/uL (ref 0.0–0.1)
EOS%: 0 % (ref 0.0–7.0)
Eosinophils Absolute: 0 10*3/uL (ref 0.0–0.5)
HCT: 37.1 % (ref 34.8–46.6)
HEMOGLOBIN: 12.4 g/dL (ref 11.6–15.9)
LYMPH%: 8.1 % — ABNORMAL LOW (ref 14.0–49.7)
MCH: 32 pg (ref 25.1–34.0)
MCHC: 33.4 g/dL (ref 31.5–36.0)
MCV: 95.6 fL (ref 79.5–101.0)
MONO#: 0.1 10*3/uL (ref 0.1–0.9)
MONO%: 2.8 % (ref 0.0–14.0)
NEUT%: 89.1 % — ABNORMAL HIGH (ref 38.4–76.8)
NEUTROS ABS: 2.9 10*3/uL (ref 1.5–6.5)
Platelets: 177 10*3/uL (ref 145–400)
RBC: 3.88 10*6/uL (ref 3.70–5.45)
RDW: 16.7 % — AB (ref 11.2–14.5)
WBC: 3.2 10*3/uL — AB (ref 3.9–10.3)
lymph#: 0.3 10*3/uL — ABNORMAL LOW (ref 0.9–3.3)

## 2017-05-10 MED ORDER — PROCHLORPERAZINE MALEATE 10 MG PO TABS
ORAL_TABLET | ORAL | Status: AC
  Filled 2017-05-10: qty 1

## 2017-05-10 MED ORDER — SODIUM CHLORIDE 0.9 % IJ SOLN
10.0000 mL | Freq: Once | INTRAMUSCULAR | Status: AC
  Administered 2017-05-10: 10 mL
  Filled 2017-05-10: qty 10

## 2017-05-10 MED ORDER — HEPARIN SOD (PORK) LOCK FLUSH 100 UNIT/ML IV SOLN
500.0000 [IU] | Freq: Once | INTRAVENOUS | Status: AC | PRN
  Administered 2017-05-10: 500 [IU]
  Filled 2017-05-10: qty 5

## 2017-05-10 MED ORDER — SODIUM CHLORIDE 0.9 % IV SOLN
Freq: Once | INTRAVENOUS | Status: AC
  Administered 2017-05-10: 12:00:00 via INTRAVENOUS

## 2017-05-10 MED ORDER — SODIUM CHLORIDE 0.9% FLUSH
10.0000 mL | INTRAVENOUS | Status: DC | PRN
  Administered 2017-05-10: 10 mL
  Filled 2017-05-10: qty 10

## 2017-05-10 MED ORDER — PEMETREXED DISODIUM CHEMO INJECTION 500 MG
500.0000 mg/m2 | Freq: Once | INTRAVENOUS | Status: AC
  Administered 2017-05-10: 900 mg via INTRAVENOUS
  Filled 2017-05-10: qty 20

## 2017-05-10 MED ORDER — CYANOCOBALAMIN 1000 MCG/ML IJ SOLN: 1000.0000 ug | Freq: Once | INTRAMUSCULAR | Status: DC

## 2017-05-10 MED ORDER — CYANOCOBALAMIN 1000 MCG/ML IJ SOLN
INTRAMUSCULAR | Status: AC
  Filled 2017-05-10: qty 1

## 2017-05-10 MED ORDER — PROCHLORPERAZINE MALEATE 10 MG PO TABS
10.0000 mg | ORAL_TABLET | Freq: Once | ORAL | Status: AC
  Administered 2017-05-10: 10 mg via ORAL

## 2017-05-10 NOTE — Addendum Note (Signed)
Encounter addended by: Malena Edman, RN on: 05/10/2017 11:10 AM<BR>    Actions taken: Delete clinical note, Pend clinical note, Order Reconciliation Section accessed, Sign clinical note

## 2017-05-10 NOTE — Addendum Note (Signed)
Encounter addended by: Malena Edman, RN on: 05/10/2017 11:11 AM<BR>    Actions taken: Charge Capture section accepted
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
## 2017-05-10 NOTE — Patient Instructions (Signed)
[facility] Discharge Instructions for Patients Receiving Chemotherapy  Today you received the following chemotherapy agents: Alimta   To help prevent nausea and vomiting after your treatment, we encourage you to take your nausea medication as directed.    If you develop nausea and vomiting that is not controlled by your nausea medication, call the clinic.   BELOW ARE SYMPTOMS THAT SHOULD BE REPORTED IMMEDIATELY:  *FEVER GREATER THAN 100.5 F  *CHILLS WITH OR WITHOUT FEVER  NAUSEA AND VOMITING THAT IS NOT CONTROLLED WITH YOUR NAUSEA MEDICATION  *UNUSUAL SHORTNESS OF BREATH  *UNUSUAL BRUISING OR BLEEDING  TENDERNESS IN MOUTH AND THROAT WITH OR WITHOUT PRESENCE OF ULCERS  *URINARY PROBLEMS  *BOWEL PROBLEMS  UNUSUAL RASH Items with * indicate a potential emergency and should be followed up as soon as possible.  Feel free to call the clinic should you have any questions or concerns. The clinic phone number is [phone].  Please show the [facility] at check-in to the Emergency Department and triage nurse.
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
## 2017-05-25 ENCOUNTER — Ambulatory Visit (INDEPENDENT_AMBULATORY_CARE_PROVIDER_SITE_OTHER): Payer: Medicare Other | Admitting: Neurology

## 2017-05-25 ENCOUNTER — Encounter: Payer: Self-pay | Admitting: Neurology

## 2017-05-25 VITALS — BP 119/76 | HR 82 | Wt 172.4 lb

## 2017-05-25 DIAGNOSIS — G40009 Localization-related (focal) (partial) idiopathic epilepsy and epileptic syndromes with seizures of localized onset, not intractable, without status epilepticus: Secondary | ICD-10-CM | POA: Diagnosis not present

## 2017-05-25 MED ORDER — LEVETIRACETAM 500 MG PO TABS: 500.0000 mg | ORAL_TABLET | Freq: Two times a day (BID) | ORAL | 4 refills | Status: AC

## 2017-05-25 NOTE — Patient Instructions (Signed)
Levetiracetam tablets What is this medicine? LEVETIRACETAM (lee ve tye RA se tam) is an antiepileptic drug. It is used with other medicines to treat certain types of seizures. This medicine may be used for other purposes; ask your health care provider or pharmacist if you have questions. COMMON BRAND NAME(S): Keppra, Roweepra What should I tell my health care provider before I take this medicine? They need to know if you have any of these conditions: -kidney disease -suicidal thoughts, plans, or attempt; a previous suicide attempt by you or a family member -an unusual or allergic reaction to levetiracetam, other medicines, foods, dyes, or preservatives -pregnant or trying to get pregnant -breast-feeding How should I use this medicine? Take this medicine by mouth with a glass of water. Follow the directions on the prescription label. Swallow the tablets whole. Do not crush or chew this medicine. You may take this medicine with or without food. Take your doses at regular intervals. Do not take your medicine more often than directed. Do not stop taking this medicine or any of your seizure medicines unless instructed by your doctor or health care professional. Stopping your medicine suddenly can increase your seizures or their severity. A special MedGuide will be given to you by the pharmacist with each prescription and refill. Be sure to read this information carefully each time. Contact your pediatrician or health care professional regarding the use of this medication in children. While this drug may be prescribed for children as young as 4 years of age for selected conditions, precautions do apply. Overdosage: If you think you have taken too much of this medicine contact a poison control center or emergency room at once. NOTE: This medicine is only for you. Do not share this medicine with others. What if I miss a dose? If you miss a dose, take it as soon as you can. If it is almost time for your  next dose, take only that dose. Do not take double or extra doses. What may interact with this medicine? This medicine may interact with the following medications: -carbamazepine -colesevelam -probenecid -sevelamer This list may not describe all possible interactions. Give your health care provider a list of all the medicines, herbs, non-prescription drugs, or dietary supplements you use. Also tell them if you smoke, drink alcohol, or use illegal drugs. Some items may interact with your medicine. What should I watch for while using this medicine? Visit your doctor or health care professional for a regular check on your progress. Wear a medical identification bracelet or chain to say you have epilepsy, and carry a card that lists all your medications. It is important to take this medicine exactly as instructed by your health care professional. When first starting treatment, your dose may need to be adjusted. It may take weeks or months before your dose is stable. You should contact your doctor or health care professional if your seizures get worse or if you have any new types of seizures. You may get drowsy or dizzy. Do not drive, use machinery, or do anything that needs mental alertness until you know how this medicine affects you. Do not stand or sit up quickly, especially if you are an older patient. This reduces the risk of dizzy or fainting spells. Alcohol may interfere with the effect of this medicine. Avoid alcoholic drinks. The use of this medicine may increase the chance of suicidal thoughts or actions. Pay special attention to how you are responding while on this medicine. Any worsening of mood, or   thoughts of suicide or dying should be reported to your health care professional right away. Women who become pregnant while using this medicine may enroll in the [place] Pregnancy Registry by calling [phone]. This registry collects information about the safety of  antiepileptic drug use during pregnancy. What side effects may I notice from receiving this medicine? Side effects that you should report to your doctor or health care professional as soon as possible: -allergic reactions like skin rash, itching or hives, swelling of the face, lips, or tongue -breathing problems -dark urine -general ill feeling or flu-like symptoms -problems with balance, talking, walking -unusually weak or tired -worsening of mood, thoughts or actions of suicide or dying -yellowing of the eyes or skin Side effects that usually do not require medical attention (report to your doctor or health care professional if they continue or are bothersome): -diarrhea -dizzy, drowsy -headache -loss of appetite This list may not describe all possible side effects. Call your doctor for medical advice about side effects. You may report side effects to FDA at 1-800-FDA-1088. Where should I keep my medicine? Keep out of reach of children. Store at room temperature between 15 and 30 degrees C (59 and 86 degrees F). Throw away any unused medicine after the expiration date. NOTE: This sheet is a summary. It may not cover all possible information. If you have questions about this medicine, talk to your doctor, pharmacist, or health care provider.  2018 Elsevier/Gold Standard (2015-08-15 09:43:54)
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
## 2017-05-29 ENCOUNTER — Other Ambulatory Visit: Payer: Self-pay | Admitting: Internal Medicine

## 2017-05-31 ENCOUNTER — Other Ambulatory Visit: Payer: Self-pay | Admitting: Medical Oncology

## 2017-05-31 ENCOUNTER — Encounter: Payer: Self-pay | Admitting: Medical Oncology

## 2017-05-31 ENCOUNTER — Ambulatory Visit: Payer: Medicare Other | Admitting: Internal Medicine

## 2017-05-31 ENCOUNTER — Ambulatory Visit: Payer: Medicare Other

## 2017-05-31 ENCOUNTER — Ambulatory Visit (HOSPITAL_BASED_OUTPATIENT_CLINIC_OR_DEPARTMENT_OTHER): Payer: Medicare Other

## 2017-05-31 ENCOUNTER — Encounter: Payer: Self-pay | Admitting: Internal Medicine

## 2017-05-31 ENCOUNTER — Other Ambulatory Visit (HOSPITAL_BASED_OUTPATIENT_CLINIC_OR_DEPARTMENT_OTHER): Payer: Medicare Other

## 2017-05-31 VITALS — BP 134/78 | HR 77 | Temp 97.9°F | Resp 18 | Ht 62.0 in | Wt 171.7 lb

## 2017-05-31 DIAGNOSIS — C7931 Secondary malignant neoplasm of brain: Secondary | ICD-10-CM

## 2017-05-31 DIAGNOSIS — C7972 Secondary malignant neoplasm of left adrenal gland: Secondary | ICD-10-CM

## 2017-05-31 DIAGNOSIS — Z5111 Encounter for antineoplastic chemotherapy: Secondary | ICD-10-CM

## 2017-05-31 DIAGNOSIS — Z95828 Presence of other vascular implants and grafts: Secondary | ICD-10-CM

## 2017-05-31 DIAGNOSIS — C3431 Malignant neoplasm of lower lobe, right bronchus or lung: Secondary | ICD-10-CM

## 2017-05-31 DIAGNOSIS — C801 Malignant (primary) neoplasm, unspecified: Secondary | ICD-10-CM

## 2017-05-31 DIAGNOSIS — T451X5A Adverse effect of antineoplastic and immunosuppressive drugs, initial encounter: Secondary | ICD-10-CM

## 2017-05-31 DIAGNOSIS — C3491 Malignant neoplasm of unspecified part of right bronchus or lung: Secondary | ICD-10-CM

## 2017-05-31 DIAGNOSIS — D6481 Anemia due to antineoplastic chemotherapy: Secondary | ICD-10-CM

## 2017-05-31 LAB — COMPREHENSIVE METABOLIC PANEL
ALK PHOS: 37 U/L — AB (ref 40–150)
ALT: 23 U/L (ref 0–55)
ANION GAP: 8 meq/L (ref 3–11)
AST: 27 U/L (ref 5–34)
Albumin: 3.4 g/dL — ABNORMAL LOW (ref 3.5–5.0)
BUN: 15.1 mg/dL (ref 7.0–26.0)
CALCIUM: 9.7 mg/dL (ref 8.4–10.4)
CO2: 23 mEq/L (ref 22–29)
Chloride: 108 mEq/L (ref 98–109)
Creatinine: 0.8 mg/dL (ref 0.6–1.1)
GLUCOSE: 87 mg/dL (ref 70–140)
Potassium: 4.2 mEq/L (ref 3.5–5.1)
Sodium: 140 mEq/L (ref 136–145)
TOTAL PROTEIN: 6.1 g/dL — AB (ref 6.4–8.3)
Total Bilirubin: 0.63 mg/dL (ref 0.20–1.20)

## 2017-05-31 LAB — CBC WITH DIFFERENTIAL/PLATELET
BASO%: 0 % (ref 0.0–2.0)
Basophils Absolute: 0 10*3/uL (ref 0.0–0.1)
EOS%: 0 % (ref 0.0–7.0)
Eosinophils Absolute: 0 10*3/uL (ref 0.0–0.5)
HCT: 35.5 % (ref 34.8–46.6)
HGB: 11.9 g/dL (ref 11.6–15.9)
LYMPH#: 0.3 10*3/uL — AB (ref 0.9–3.3)
LYMPH%: 4.4 % — AB (ref 14.0–49.7)
MCH: 32 pg (ref 25.1–34.0)
MCHC: 33.5 g/dL (ref 31.5–36.0)
MCV: 95.4 fL (ref 79.5–101.0)
MONO#: 0.5 10*3/uL (ref 0.1–0.9)
MONO%: 7.7 % (ref 0.0–14.0)
NEUT#: 5.6 10*3/uL (ref 1.5–6.5)
NEUT%: 87.9 % — AB (ref 38.4–76.8)
Platelets: 196 10*3/uL (ref 145–400)
RBC: 3.72 10*6/uL (ref 3.70–5.45)
RDW: 16.6 % — ABNORMAL HIGH (ref 11.2–14.5)
WBC: 6.3 10*3/uL (ref 3.9–10.3)

## 2017-05-31 MED ORDER — SODIUM CHLORIDE 0.9% FLUSH
10.0000 mL | INTRAVENOUS | Status: DC | PRN
  Administered 2017-05-31: 10 mL
  Filled 2017-05-31: qty 10

## 2017-05-31 MED ORDER — HEPARIN SOD (PORK) LOCK FLUSH 100 UNIT/ML IV SOLN
500.0000 [IU] | Freq: Once | INTRAVENOUS | Status: AC | PRN
Start: 2017-05-31 — End: 2017-05-31
  Administered 2017-05-31: 500 [IU]
  Filled 2017-05-31: qty 5

## 2017-05-31 MED ORDER — SODIUM CHLORIDE 0.9 % IV SOLN
Freq: Once | INTRAVENOUS | Status: AC
  Administered 2017-05-31: 15:00:00 via INTRAVENOUS

## 2017-05-31 MED ORDER — SODIUM CHLORIDE 0.9% FLUSH
10.0000 mL | INTRAVENOUS | Status: DC | PRN
  Administered 2017-05-31: 10 mL via INTRAVENOUS
  Filled 2017-05-31: qty 10

## 2017-05-31 MED ORDER — PROCHLORPERAZINE MALEATE 10 MG PO TABS
10.0000 mg | ORAL_TABLET | Freq: Once | ORAL | Status: AC
  Administered 2017-05-31: 10 mg via ORAL

## 2017-05-31 MED ORDER — PROCHLORPERAZINE MALEATE 10 MG PO TABS
ORAL_TABLET | ORAL | Status: AC
  Filled 2017-05-31: qty 1

## 2017-05-31 MED ORDER — SODIUM CHLORIDE 0.9 % IV SOLN
500.0000 mg/m2 | Freq: Once | INTRAVENOUS | Status: AC
  Administered 2017-05-31: 900 mg via INTRAVENOUS
  Filled 2017-05-31: qty 20
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
## 2017-05-31 NOTE — Patient Instructions (Signed)
[facility] Discharge Instructions for Patients Receiving Chemotherapy  Today you received the following chemotherapy agents Alimta  To help prevent nausea and vomiting after your treatment, we encourage you to take your nausea medication as directed   If you develop nausea and vomiting that is not controlled by your nausea medication, call the clinic.   BELOW ARE SYMPTOMS THAT SHOULD BE REPORTED IMMEDIATELY:  *FEVER GREATER THAN 100.5 F  *CHILLS WITH OR WITHOUT FEVER  NAUSEA AND VOMITING THAT IS NOT CONTROLLED WITH YOUR NAUSEA MEDICATION  *UNUSUAL SHORTNESS OF BREATH  *UNUSUAL BRUISING OR BLEEDING  TENDERNESS IN MOUTH AND THROAT WITH OR WITHOUT PRESENCE OF ULCERS  *URINARY PROBLEMS  *BOWEL PROBLEMS  UNUSUAL RASH Items with * indicate a potential emergency and should be followed up as soon as possible.  Feel free to call the clinic should you have any questions or concerns. The clinic phone number is [phone].  Please show the [facility] at check-in to the Emergency Department and triage nurse.

## 2017-06-10 ENCOUNTER — Ambulatory Visit (HOSPITAL_BASED_OUTPATIENT_CLINIC_OR_DEPARTMENT_OTHER): Payer: Medicare Other | Admitting: Medical

## 2017-06-10 ENCOUNTER — Telehealth: Payer: Self-pay | Admitting: Medical

## 2017-06-10 VITALS — BP 124/76 | HR 78 | Temp 98.3°F | Resp 19 | Ht 62.0 in | Wt 171.7 lb

## 2017-06-10 DIAGNOSIS — C3431 Malignant neoplasm of lower lobe, right bronchus or lung: Secondary | ICD-10-CM

## 2017-06-10 DIAGNOSIS — Z86718 Personal history of other venous thrombosis and embolism: Secondary | ICD-10-CM

## 2017-06-10 DIAGNOSIS — H1133 Conjunctival hemorrhage, bilateral: Secondary | ICD-10-CM

## 2017-06-10 DIAGNOSIS — H10403 Unspecified chronic conjunctivitis, bilateral: Secondary | ICD-10-CM

## 2017-06-10 DIAGNOSIS — Z7901 Long term (current) use of anticoagulants: Secondary | ICD-10-CM | POA: Diagnosis not present

## 2017-06-10 LAB — CBC WITH DIFFERENTIAL/PLATELET
BASO%: 0.6 % (ref 0.0–2.0)
Basophils Absolute: 0 10*3/uL (ref 0.0–0.1)
EOS ABS: 0 10*3/uL (ref 0.0–0.5)
EOS%: 2.3 % (ref 0.0–7.0)
HCT: 37.8 % (ref 34.8–46.6)
HGB: 12.8 g/dL (ref 11.6–15.9)
LYMPH%: 22.4 % (ref 14.0–49.7)
MCH: 31.8 pg (ref 25.1–34.0)
MCHC: 33.8 g/dL (ref 31.5–36.0)
MCV: 94.2 fL (ref 79.5–101.0)
MONO#: 0.7 10*3/uL (ref 0.1–0.9)
MONO%: 33.5 % — AB (ref 0.0–14.0)
NEUT%: 41.2 % (ref 38.4–76.8)
NEUTROS ABS: 0.9 10*3/uL — AB (ref 1.5–6.5)
PLATELETS: 147 10*3/uL (ref 145–400)
RBC: 4.01 10*6/uL (ref 3.70–5.45)
RDW: 16.7 % — ABNORMAL HIGH (ref 11.2–14.5)
WBC: 2.1 10*3/uL — AB (ref 3.9–10.3)
lymph#: 0.5 10*3/uL — ABNORMAL LOW (ref 0.9–3.3)

## 2017-06-10 MED ORDER — NEOMYCIN-POLYMYXIN-HC 1 % OT SOLN: 3.0000 [drp] | Freq: Three times a day (TID) | OTIC | 2 refills | Status: DC

## 2017-06-10 NOTE — Telephone Encounter (Signed)
Patient scheduled per 11/15 los.

## 2017-06-10 NOTE — Progress Notes (Signed)
These results were called to [facility]. A message was left for her with these results.  Lucianne Lei

## 2017-06-10 NOTE — Progress Notes (Signed)
Pt comes to [facility] with c/o very red eyes-started Saturday. C/o itchy eyes

## 2017-06-11 MED ORDER — NEOMYCIN-POLYMYXIN-HC 3.5-10000-1 OP SUSP: 2.0000 [drp] | Freq: Four times a day (QID) | OPHTHALMIC | 2 refills | Status: AC
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
## 2017-06-21 ENCOUNTER — Other Ambulatory Visit (HOSPITAL_BASED_OUTPATIENT_CLINIC_OR_DEPARTMENT_OTHER): Payer: Medicare Other

## 2017-06-21 ENCOUNTER — Ambulatory Visit (HOSPITAL_BASED_OUTPATIENT_CLINIC_OR_DEPARTMENT_OTHER): Payer: Medicare Other | Admitting: Internal Medicine

## 2017-06-21 ENCOUNTER — Telehealth: Payer: Self-pay | Admitting: Internal Medicine

## 2017-06-21 ENCOUNTER — Ambulatory Visit: Payer: Medicare Other

## 2017-06-21 ENCOUNTER — Ambulatory Visit (HOSPITAL_BASED_OUTPATIENT_CLINIC_OR_DEPARTMENT_OTHER): Payer: Medicare Other

## 2017-06-21 ENCOUNTER — Encounter: Payer: Self-pay | Admitting: Internal Medicine

## 2017-06-21 DIAGNOSIS — C3491 Malignant neoplasm of unspecified part of right bronchus or lung: Secondary | ICD-10-CM

## 2017-06-21 DIAGNOSIS — C801 Malignant (primary) neoplasm, unspecified: Secondary | ICD-10-CM

## 2017-06-21 DIAGNOSIS — C3431 Malignant neoplasm of lower lobe, right bronchus or lung: Secondary | ICD-10-CM

## 2017-06-21 DIAGNOSIS — C7972 Secondary malignant neoplasm of left adrenal gland: Secondary | ICD-10-CM | POA: Diagnosis not present

## 2017-06-21 DIAGNOSIS — Z95828 Presence of other vascular implants and grafts: Secondary | ICD-10-CM

## 2017-06-21 DIAGNOSIS — C3490 Malignant neoplasm of unspecified part of unspecified bronchus or lung: Secondary | ICD-10-CM

## 2017-06-21 DIAGNOSIS — C7931 Secondary malignant neoplasm of brain: Secondary | ICD-10-CM | POA: Diagnosis not present

## 2017-06-21 DIAGNOSIS — Z5111 Encounter for antineoplastic chemotherapy: Secondary | ICD-10-CM | POA: Diagnosis not present

## 2017-06-21 LAB — COMPREHENSIVE METABOLIC PANEL
ALBUMIN: 3.3 g/dL — AB (ref 3.5–5.0)
ALK PHOS: 38 U/L — AB (ref 40–150)
ALT: 23 U/L (ref 0–55)
ANION GAP: 8 meq/L (ref 3–11)
AST: 23 U/L (ref 5–34)
BUN: 18.2 mg/dL (ref 7.0–26.0)
CALCIUM: 9.9 mg/dL (ref 8.4–10.4)
CHLORIDE: 106 meq/L (ref 98–109)
CO2: 23 mEq/L (ref 22–29)
CREATININE: 0.7 mg/dL (ref 0.6–1.1)
EGFR: >60 mL/min/{1.73_m2} (ref >60)
Glucose: 121 mg/dl (ref 70–140)
POTASSIUM: 4.3 meq/L (ref 3.5–5.1)
Sodium: 137 mEq/L (ref 136–145)
Total Bilirubin: 0.6 mg/dL (ref 0.20–1.20)
Total Protein: 6.2 g/dL — ABNORMAL LOW (ref 6.4–8.3)

## 2017-06-21 LAB — CBC WITH DIFFERENTIAL/PLATELET
BASO%: 0 % (ref 0.0–2.0)
BASOS ABS: 0 10*3/uL (ref 0.0–0.1)
EOS%: 0 % (ref 0.0–7.0)
Eosinophils Absolute: 0 10*3/uL (ref 0.0–0.5)
HEMATOCRIT: 36.1 % (ref 34.8–46.6)
HEMOGLOBIN: 12.4 g/dL (ref 11.6–15.9)
LYMPH#: 0.4 10*3/uL — AB (ref 0.9–3.3)
LYMPH%: 7.4 % — ABNORMAL LOW (ref 14.0–49.7)
MCH: 32.5 pg (ref 25.1–34.0)
MCHC: 34.3 g/dL (ref 31.5–36.0)
MCV: 94.5 fL (ref 79.5–101.0)
MONO#: 0.2 10*3/uL (ref 0.1–0.9)
MONO%: 3.3 % (ref 0.0–14.0)
NEUT#: 4.4 10*3/uL (ref 1.5–6.5)
NEUT%: 89.3 % — AB (ref 38.4–76.8)
PLATELETS: 148 10*3/uL (ref 145–400)
RBC: 3.82 10*6/uL (ref 3.70–5.45)
RDW: 16.2 % — AB (ref 11.2–14.5)
WBC: 4.9 10*3/uL (ref 3.9–10.3)

## 2017-06-21 LAB — RESEARCH LABS

## 2017-06-21 MED ORDER — PROCHLORPERAZINE MALEATE 10 MG PO TABS
10.0000 mg | ORAL_TABLET | Freq: Once | ORAL | Status: AC
  Administered 2017-06-21: 10 mg via ORAL

## 2017-06-21 MED ORDER — PROCHLORPERAZINE MALEATE 10 MG PO TABS
ORAL_TABLET | ORAL | Status: AC
  Filled 2017-06-21: qty 1

## 2017-06-21 MED ORDER — SODIUM CHLORIDE 0.9% FLUSH
10.0000 mL | INTRAVENOUS | Status: DC | PRN
  Administered 2017-06-21: 10 mL
  Filled 2017-06-21: qty 10

## 2017-06-21 MED ORDER — HEPARIN SOD (PORK) LOCK FLUSH 100 UNIT/ML IV SOLN
500.0000 [IU] | Freq: Once | INTRAVENOUS | Status: AC | PRN
  Administered 2017-06-21: 500 [IU]
  Filled 2017-06-21: qty 5

## 2017-06-21 MED ORDER — CYANOCOBALAMIN 1000 MCG/ML IJ SOLN
1000.0000 ug | Freq: Once | INTRAMUSCULAR | Status: AC
  Administered 2017-06-21: 1000 ug via INTRAMUSCULAR

## 2017-06-21 MED ORDER — CYANOCOBALAMIN 1000 MCG/ML IJ SOLN
INTRAMUSCULAR | Status: AC
  Filled 2017-06-21: qty 1

## 2017-06-21 MED ORDER — SODIUM CHLORIDE 0.9 % IV SOLN
Freq: Once | INTRAVENOUS | Status: AC
  Administered 2017-06-21: 12:00:00 via INTRAVENOUS

## 2017-06-21 MED ORDER — SODIUM CHLORIDE 0.9% FLUSH
10.0000 mL | INTRAVENOUS | Status: DC | PRN
Start: 2017-06-21 — End: 2017-06-21
  Administered 2017-06-21: 10 mL via INTRAVENOUS
  Filled 2017-06-21: qty 10

## 2017-06-21 MED ORDER — PEMETREXED DISODIUM CHEMO INJECTION 500 MG
500.0000 mg/m2 | Freq: Once | INTRAVENOUS | Status: AC
  Administered 2017-06-21: 900 mg via INTRAVENOUS
  Filled 2017-06-21: qty 20

## 2017-06-21 NOTE — Patient Instructions (Signed)
[facility] Discharge Instructions for Patients Receiving Chemotherapy  Today you received the following chemotherapy agents:  Alimta.  To help prevent nausea and vomiting after your treatment, we encourage you to take your nausea medication as directed.   If you develop nausea and vomiting that is not controlled by your nausea medication, call the clinic.   BELOW ARE SYMPTOMS THAT SHOULD BE REPORTED IMMEDIATELY:  *FEVER GREATER THAN 100.5 F  *CHILLS WITH OR WITHOUT FEVER  NAUSEA AND VOMITING THAT IS NOT CONTROLLED WITH YOUR NAUSEA MEDICATION  *UNUSUAL SHORTNESS OF BREATH  *UNUSUAL BRUISING OR BLEEDING  TENDERNESS IN MOUTH AND THROAT WITH OR WITHOUT PRESENCE OF ULCERS  *URINARY PROBLEMS  *BOWEL PROBLEMS  UNUSUAL RASH Items with * indicate a potential emergency and should be followed up as soon as possible.  Feel free to call the clinic should you have any questions or concerns. The clinic phone number is [phone].  Please show the [place] at check-in to the Emergency Department and triage nurse.
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
## 2017-06-21 NOTE — Telephone Encounter (Signed)
Gave avs and calendar for December - February 2019

## 2017-06-25 ENCOUNTER — Other Ambulatory Visit: Payer: Self-pay | Admitting: *Deleted

## 2017-06-25 DIAGNOSIS — C7931 Secondary malignant neoplasm of brain: Secondary | ICD-10-CM

## 2017-06-25 DIAGNOSIS — C7949 Secondary malignant neoplasm of other parts of nervous system: Principal | ICD-10-CM

## 2017-06-28 ENCOUNTER — Other Ambulatory Visit: Payer: Self-pay | Admitting: Radiation Oncology

## 2017-06-28 ENCOUNTER — Other Ambulatory Visit: Payer: Self-pay | Admitting: *Deleted

## 2017-06-28 MED ORDER — SUCRALFATE 1 G PO TABS: 1.0000 g | ORAL_TABLET | Freq: Four times a day (QID) | ORAL | 3 refills | Status: DC

## 2017-07-08 ENCOUNTER — Other Ambulatory Visit: Payer: Self-pay | Admitting: Internal Medicine

## 2017-07-09 ENCOUNTER — Ambulatory Visit (HOSPITAL_COMMUNITY)
Admission: RE | Admit: 2017-07-09 | Discharge: 2017-07-09 | Disposition: A | Discharge status: Home / Self Care | Payer: Medicare Other | Source: Ambulatory Visit | Attending: Internal Medicine | Admitting: Internal Medicine

## 2017-07-09 DIAGNOSIS — Z923 Personal history of irradiation: Secondary | ICD-10-CM | POA: Insufficient documentation

## 2017-07-09 DIAGNOSIS — K5710 Diverticulosis of small intestine without perforation or abscess without bleeding: Secondary | ICD-10-CM | POA: Diagnosis not present

## 2017-07-09 DIAGNOSIS — I700 Atherosclerosis of aorta: Secondary | ICD-10-CM | POA: Insufficient documentation

## 2017-07-09 DIAGNOSIS — K449 Diaphragmatic hernia without obstruction or gangrene: Secondary | ICD-10-CM | POA: Diagnosis not present

## 2017-07-09 DIAGNOSIS — K7689 Other specified diseases of liver: Secondary | ICD-10-CM | POA: Diagnosis not present

## 2017-07-09 DIAGNOSIS — J432 Centrilobular emphysema: Secondary | ICD-10-CM | POA: Insufficient documentation

## 2017-07-09 DIAGNOSIS — C3490 Malignant neoplasm of unspecified part of unspecified bronchus or lung: Secondary | ICD-10-CM | POA: Insufficient documentation

## 2017-07-09 DIAGNOSIS — K429 Umbilical hernia without obstruction or gangrene: Secondary | ICD-10-CM | POA: Insufficient documentation

## 2017-07-09 MED ORDER — IOPAMIDOL (ISOVUE-300) INJECTION 61%
INTRAVENOUS | Status: AC
  Administered 2017-07-09: 100 mL via INTRAVENOUS
  Filled 2017-07-09: qty 100

## 2017-07-12 ENCOUNTER — Encounter: Payer: Self-pay | Admitting: Internal Medicine

## 2017-07-12 ENCOUNTER — Telehealth: Payer: Self-pay | Admitting: Internal Medicine

## 2017-07-12 ENCOUNTER — Ambulatory Visit: Payer: Medicare Other

## 2017-07-12 ENCOUNTER — Other Ambulatory Visit (HOSPITAL_BASED_OUTPATIENT_CLINIC_OR_DEPARTMENT_OTHER): Payer: Medicare Other

## 2017-07-12 ENCOUNTER — Ambulatory Visit (HOSPITAL_BASED_OUTPATIENT_CLINIC_OR_DEPARTMENT_OTHER): Payer: Medicare Other

## 2017-07-12 ENCOUNTER — Ambulatory Visit (HOSPITAL_BASED_OUTPATIENT_CLINIC_OR_DEPARTMENT_OTHER): Payer: Medicare Other | Admitting: Internal Medicine

## 2017-07-12 DIAGNOSIS — C7972 Secondary malignant neoplasm of left adrenal gland: Secondary | ICD-10-CM | POA: Diagnosis not present

## 2017-07-12 DIAGNOSIS — C7931 Secondary malignant neoplasm of brain: Secondary | ICD-10-CM

## 2017-07-12 DIAGNOSIS — R5383 Other fatigue: Secondary | ICD-10-CM | POA: Diagnosis not present

## 2017-07-12 DIAGNOSIS — C3431 Malignant neoplasm of lower lobe, right bronchus or lung: Secondary | ICD-10-CM

## 2017-07-12 DIAGNOSIS — C3491 Malignant neoplasm of unspecified part of right bronchus or lung: Secondary | ICD-10-CM

## 2017-07-12 DIAGNOSIS — C801 Malignant (primary) neoplasm, unspecified: Secondary | ICD-10-CM

## 2017-07-12 DIAGNOSIS — Z95828 Presence of other vascular implants and grafts: Secondary | ICD-10-CM

## 2017-07-12 DIAGNOSIS — Z5111 Encounter for antineoplastic chemotherapy: Secondary | ICD-10-CM

## 2017-07-12 LAB — CBC WITH DIFFERENTIAL/PLATELET
BASO%: 0.2 % (ref 0.0–2.0)
BASOS ABS: 0 10*3/uL (ref 0.0–0.1)
EOS%: 0 % (ref 0.0–7.0)
Eosinophils Absolute: 0 10*3/uL (ref 0.0–0.5)
HEMATOCRIT: 36.1 % (ref 34.8–46.6)
HGB: 12.1 g/dL (ref 11.6–15.9)
LYMPH#: 0.3 10*3/uL — AB (ref 0.9–3.3)
LYMPH%: 5.3 % — AB (ref 14.0–49.7)
MCH: 31.9 pg (ref 25.1–34.0)
MCHC: 33.5 g/dL (ref 31.5–36.0)
MCV: 95.3 fL (ref 79.5–101.0)
MONO#: 0.2 10*3/uL (ref 0.1–0.9)
MONO%: 3.7 % (ref 0.0–14.0)
NEUT#: 4.6 10*3/uL (ref 1.5–6.5)
NEUT%: 90.8 % — AB (ref 38.4–76.8)
Platelets: 156 10*3/uL (ref 145–400)
RBC: 3.79 10*6/uL (ref 3.70–5.45)
RDW: 16.4 % — ABNORMAL HIGH (ref 11.2–14.5)
WBC: 5.1 10*3/uL (ref 3.9–10.3)

## 2017-07-12 LAB — COMPREHENSIVE METABOLIC PANEL
ALBUMIN: 3.3 g/dL — AB (ref 3.5–5.0)
ALT: 22 U/L (ref 0–55)
AST: 24 U/L (ref 5–34)
Alkaline Phosphatase: 41 U/L (ref 40–150)
Anion Gap: 10 mEq/L (ref 3–11)
BILIRUBIN TOTAL: 0.6 mg/dL (ref 0.20–1.20)
BUN: 18.1 mg/dL (ref 7.0–26.0)
CO2: 21 meq/L — AB (ref 22–29)
CREATININE: 0.8 mg/dL (ref 0.6–1.1)
Calcium: 9.4 mg/dL (ref 8.4–10.4)
Chloride: 107 mEq/L (ref 98–109)
EGFR: >60 mL/min/{1.73_m2} (ref >60)
GLUCOSE: 150 mg/dL — AB (ref 70–140)
Potassium: 4 mEq/L (ref 3.5–5.1)
SODIUM: 138 meq/L (ref 136–145)
TOTAL PROTEIN: 6.2 g/dL — AB (ref 6.4–8.3)

## 2017-07-12 MED ORDER — PROCHLORPERAZINE MALEATE 10 MG PO TABS
ORAL_TABLET | ORAL | Status: AC
  Filled 2017-07-12: qty 1

## 2017-07-12 MED ORDER — SODIUM CHLORIDE 0.9% FLUSH
10.0000 mL | INTRAVENOUS | Status: DC | PRN
  Administered 2017-07-12: 10 mL
  Filled 2017-07-12: qty 10

## 2017-07-12 MED ORDER — SODIUM CHLORIDE 0.9 % IV SOLN
Freq: Once | INTRAVENOUS | Status: AC
  Administered 2017-07-12: 14:00:00 via INTRAVENOUS

## 2017-07-12 MED ORDER — SODIUM CHLORIDE 0.9% FLUSH
10.0000 mL | INTRAVENOUS | Status: DC | PRN
  Administered 2017-07-12: 10 mL via INTRAVENOUS
  Filled 2017-07-12: qty 10

## 2017-07-12 MED ORDER — SODIUM CHLORIDE 0.9 % IV SOLN
500.0000 mg/m2 | Freq: Once | INTRAVENOUS | Status: AC
  Administered 2017-07-12: 900 mg via INTRAVENOUS
  Filled 2017-07-12: qty 20

## 2017-07-12 MED ORDER — HEPARIN SOD (PORK) LOCK FLUSH 100 UNIT/ML IV SOLN
500.0000 [IU] | Freq: Once | INTRAVENOUS | Status: AC | PRN
  Administered 2017-07-12: 500 [IU]
  Filled 2017-07-12: qty 5

## 2017-07-12 MED ORDER — PROCHLORPERAZINE MALEATE 10 MG PO TABS
10.0000 mg | ORAL_TABLET | Freq: Once | ORAL | Status: AC
  Administered 2017-07-12: 10 mg via ORAL

## 2017-07-12 MED ORDER — LIDOCAINE-PRILOCAINE 2.5-2.5 % EX CREA: 1.0000 "application " | TOPICAL_CREAM | CUTANEOUS | 0 refills | Status: AC | PRN

## 2017-07-12 NOTE — Telephone Encounter (Signed)
Gave avs and calendar for January  °

## 2017-07-12 NOTE — Patient Instructions (Signed)
[facility] Discharge Instructions for Patients Receiving Chemotherapy  Today you received the following chemotherapy agents:  Alimta.  To help prevent nausea and vomiting after your treatment, we encourage you to take your nausea medication as directed.   If you develop nausea and vomiting that is not controlled by your nausea medication, call the clinic.   BELOW ARE SYMPTOMS THAT SHOULD BE REPORTED IMMEDIATELY:  *FEVER GREATER THAN 100.5 F  *CHILLS WITH OR WITHOUT FEVER  NAUSEA AND VOMITING THAT IS NOT CONTROLLED WITH YOUR NAUSEA MEDICATION  *UNUSUAL SHORTNESS OF BREATH  *UNUSUAL BRUISING OR BLEEDING  TENDERNESS IN MOUTH AND THROAT WITH OR WITHOUT PRESENCE OF ULCERS  *URINARY PROBLEMS  *BOWEL PROBLEMS  UNUSUAL RASH Items with * indicate a potential emergency and should be followed up as soon as possible.  Feel free to call the clinic should you have any questions or concerns. The clinic phone number is [phone].  Please show the [place] at check-in to the Emergency Department and triage nurse.
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
## 2017-08-02 ENCOUNTER — Inpatient Hospital Stay: Payer: Medicare Other

## 2017-08-02 ENCOUNTER — Telehealth: Payer: Self-pay | Admitting: Internal Medicine

## 2017-08-02 ENCOUNTER — Inpatient Hospital Stay: Payer: Medicare Other | Attending: Internal Medicine | Admitting: Internal Medicine

## 2017-08-02 ENCOUNTER — Encounter: Payer: Self-pay | Admitting: Internal Medicine

## 2017-08-02 VITALS — BP 124/69 | HR 79 | Temp 98.9°F | Resp 18 | Ht 62.0 in | Wt 173.7 lb

## 2017-08-02 DIAGNOSIS — M7989 Other specified soft tissue disorders: Secondary | ICD-10-CM | POA: Insufficient documentation

## 2017-08-02 DIAGNOSIS — Z171 Estrogen receptor negative status [ER-]: Secondary | ICD-10-CM | POA: Diagnosis not present

## 2017-08-02 DIAGNOSIS — C3431 Malignant neoplasm of lower lobe, right bronchus or lung: Secondary | ICD-10-CM | POA: Insufficient documentation

## 2017-08-02 DIAGNOSIS — Z79899 Other long term (current) drug therapy: Secondary | ICD-10-CM | POA: Diagnosis not present

## 2017-08-02 DIAGNOSIS — K429 Umbilical hernia without obstruction or gangrene: Secondary | ICD-10-CM | POA: Insufficient documentation

## 2017-08-02 DIAGNOSIS — G4733 Obstructive sleep apnea (adult) (pediatric): Secondary | ICD-10-CM | POA: Diagnosis not present

## 2017-08-02 DIAGNOSIS — Z8719 Personal history of other diseases of the digestive system: Secondary | ICD-10-CM | POA: Insufficient documentation

## 2017-08-02 DIAGNOSIS — Z5111 Encounter for antineoplastic chemotherapy: Secondary | ICD-10-CM

## 2017-08-02 DIAGNOSIS — Z9011 Acquired absence of right breast and nipple: Secondary | ICD-10-CM | POA: Insufficient documentation

## 2017-08-02 DIAGNOSIS — Z87440 Personal history of urinary (tract) infections: Secondary | ICD-10-CM | POA: Diagnosis not present

## 2017-08-02 DIAGNOSIS — J439 Emphysema, unspecified: Secondary | ICD-10-CM | POA: Insufficient documentation

## 2017-08-02 DIAGNOSIS — Z9049 Acquired absence of other specified parts of digestive tract: Secondary | ICD-10-CM | POA: Insufficient documentation

## 2017-08-02 DIAGNOSIS — C7972 Secondary malignant neoplasm of left adrenal gland: Secondary | ICD-10-CM | POA: Insufficient documentation

## 2017-08-02 DIAGNOSIS — I700 Atherosclerosis of aorta: Secondary | ICD-10-CM | POA: Diagnosis not present

## 2017-08-02 DIAGNOSIS — K219 Gastro-esophageal reflux disease without esophagitis: Secondary | ICD-10-CM | POA: Insufficient documentation

## 2017-08-02 DIAGNOSIS — F419 Anxiety disorder, unspecified: Secondary | ICD-10-CM | POA: Diagnosis not present

## 2017-08-02 DIAGNOSIS — G629 Polyneuropathy, unspecified: Secondary | ICD-10-CM | POA: Insufficient documentation

## 2017-08-02 DIAGNOSIS — Z923 Personal history of irradiation: Secondary | ICD-10-CM | POA: Diagnosis not present

## 2017-08-02 DIAGNOSIS — K449 Diaphragmatic hernia without obstruction or gangrene: Secondary | ICD-10-CM | POA: Diagnosis not present

## 2017-08-02 DIAGNOSIS — Z95828 Presence of other vascular implants and grafts: Secondary | ICD-10-CM

## 2017-08-02 DIAGNOSIS — C801 Malignant (primary) neoplasm, unspecified: Secondary | ICD-10-CM

## 2017-08-02 DIAGNOSIS — K7689 Other specified diseases of liver: Secondary | ICD-10-CM | POA: Insufficient documentation

## 2017-08-02 DIAGNOSIS — Z85828 Personal history of other malignant neoplasm of skin: Secondary | ICD-10-CM | POA: Diagnosis not present

## 2017-08-02 DIAGNOSIS — Z9221 Personal history of antineoplastic chemotherapy: Secondary | ICD-10-CM | POA: Insufficient documentation

## 2017-08-02 DIAGNOSIS — C3491 Malignant neoplasm of unspecified part of right bronchus or lung: Secondary | ICD-10-CM

## 2017-08-02 DIAGNOSIS — C7931 Secondary malignant neoplasm of brain: Secondary | ICD-10-CM | POA: Diagnosis not present

## 2017-08-02 DIAGNOSIS — Z853 Personal history of malignant neoplasm of breast: Secondary | ICD-10-CM | POA: Insufficient documentation

## 2017-08-02 LAB — CBC WITH DIFFERENTIAL/PLATELET
ABS GRANULOCYTE: 6.1 10*3/uL (ref 1.5–6.5)
BASOS ABS: 0 10*3/uL (ref 0.0–0.1)
Basophils Relative: 0 %
Eosinophils Absolute: 0 10*3/uL (ref 0.0–0.5)
Eosinophils Relative: 0 %
HEMATOCRIT: 37.2 % (ref 34.8–46.6)
Hemoglobin: 12.7 g/dL (ref 11.6–15.9)
LYMPHS ABS: 0.3 10*3/uL — AB (ref 0.9–3.3)
LYMPHS PCT: 4 %
MCH: 32.1 pg (ref 25.1–34.0)
MCHC: 34.1 g/dL (ref 31.5–36.0)
MCV: 94.2 fL (ref 79.5–101.0)
Monocytes Absolute: 0.4 10*3/uL (ref 0.1–0.9)
Monocytes Relative: 6 %
NEUTROS PCT: 90 %
Neutro Abs: 6.1 10*3/uL (ref 1.5–6.5)
PLATELETS: 183 10*3/uL (ref 145–400)
RBC: 3.95 MIL/uL (ref 3.70–5.45)
RDW: 17.2 % — ABNORMAL HIGH (ref 11.2–16.1)
WBC: 6.8 10*3/uL (ref 3.9–10.3)

## 2017-08-02 LAB — COMPREHENSIVE METABOLIC PANEL
ALK PHOS: 38 U/L — AB (ref 40–150)
ALT: 24 U/L (ref 0–55)
AST: 25 U/L (ref 5–34)
Albumin: 3.5 g/dL (ref 3.5–5.0)
Anion gap: 7 (ref 3–11)
BILIRUBIN TOTAL: 0.7 mg/dL (ref 0.2–1.2)
BUN: 23 mg/dL (ref 7–26)
CALCIUM: 9.8 mg/dL (ref 8.4–10.4)
CHLORIDE: 107 mmol/L (ref 98–109)
CO2: 24 mmol/L (ref 22–29)
Creatinine, Ser: 0.73 mg/dL (ref 0.60–1.10)
GFR calc Af Amer: >60 mL/min (ref >60)
Glucose, Bld: 98 mg/dL (ref 70–140)
Potassium: 4.4 mmol/L (ref 3.3–4.7)
Sodium: 138 mmol/L (ref 136–145)
Total Protein: 6.3 g/dL — ABNORMAL LOW (ref 6.4–8.3)

## 2017-08-02 MED ORDER — HEPARIN SOD (PORK) LOCK FLUSH 100 UNIT/ML IV SOLN
500.0000 [IU] | Freq: Once | INTRAVENOUS | Status: AC | PRN
  Administered 2017-08-02: 500 [IU]
  Filled 2017-08-02: qty 5

## 2017-08-02 MED ORDER — SODIUM CHLORIDE 0.9 % IV SOLN
500.0000 mg/m2 | Freq: Once | INTRAVENOUS | Status: AC
  Administered 2017-08-02: 900 mg via INTRAVENOUS
  Filled 2017-08-02: qty 20

## 2017-08-02 MED ORDER — SODIUM CHLORIDE 0.9 % IV SOLN
Freq: Once | INTRAVENOUS | Status: AC
  Administered 2017-08-02: 15:00:00 via INTRAVENOUS

## 2017-08-02 MED ORDER — SODIUM CHLORIDE 0.9% FLUSH
10.0000 mL | INTRAVENOUS | Status: DC | PRN
  Administered 2017-08-02: 10 mL via INTRAVENOUS
  Filled 2017-08-02: qty 10

## 2017-08-02 MED ORDER — PROCHLORPERAZINE MALEATE 10 MG PO TABS
ORAL_TABLET | ORAL | Status: AC
  Filled 2017-08-02: qty 1

## 2017-08-02 MED ORDER — PROCHLORPERAZINE MALEATE 10 MG PO TABS
10.0000 mg | ORAL_TABLET | Freq: Once | ORAL | Status: AC
  Administered 2017-08-02: 10 mg via ORAL

## 2017-08-02 MED ORDER — SODIUM CHLORIDE 0.9% FLUSH
10.0000 mL | INTRAVENOUS | Status: DC | PRN
  Administered 2017-08-02: 10 mL
  Filled 2017-08-02: qty 10

## 2017-08-02 NOTE — Telephone Encounter (Signed)
Gave avs and calendar for January- april °

## 2017-08-02 NOTE — Patient Instructions (Signed)
[facility] Discharge Instructions for Patients Receiving Chemotherapy  Today you received the following chemotherapy agent:  Alimta.  To help prevent nausea and vomiting after your treatment, we encourage you to take your nausea medication as directed.   If you develop nausea and vomiting that is not controlled by your nausea medication, call the clinic.   BELOW ARE SYMPTOMS THAT SHOULD BE REPORTED IMMEDIATELY:  *FEVER GREATER THAN 100.5 F  *CHILLS WITH OR WITHOUT FEVER  NAUSEA AND VOMITING THAT IS NOT CONTROLLED WITH YOUR NAUSEA MEDICATION  *UNUSUAL SHORTNESS OF BREATH  *UNUSUAL BRUISING OR BLEEDING  TENDERNESS IN MOUTH AND THROAT WITH OR WITHOUT PRESENCE OF ULCERS  *URINARY PROBLEMS  *BOWEL PROBLEMS  UNUSUAL RASH Items with * indicate a potential emergency and should be followed up as soon as possible.  Feel free to call the clinic should you have any questions or concerns. The clinic phone number is [phone].  Please show the [facility] at check-in to the Emergency Department and triage nurse.
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
## 2017-08-04 ENCOUNTER — Other Ambulatory Visit: Payer: Self-pay | Admitting: Internal Medicine

## 2017-08-05 NOTE — Progress Notes (Signed)
Molly Black. [place]75 y.o.woman with 4 right frontal brain metastases from non-small cell cancer of the left lower lung completed 07-31-16 brain,review 08-12-17 MRI brain w contrast,FU.   Headache: None Dizziness: None Nausea/vomiting: None Diplopia: None Ringing in ears:No Visual changes:Right eye will swell at times. Eye runs a lot  Fatigue: No Cognitive changes:Alert and oriented x three.   Taking Decadron 4 mg the day before the day of and the day after chemotherapy no sign of thrush. Weight:173.4    08-02-17 Saw Dr. Julien Nordmann maintenance systemic chemotherapy with single agent Alimta 500 MG/M2 every 3 weeks   Vitals:   08/16/17 1025  BP: 111/77  Pulse: 84  Resp: 18  Temp: 97.7 F (36.5 C)  TempSrc: Oral  SpO2: 100%  Weight: 173 lb 4 oz (78.6 kg)   Wt Readings from Last 3 Encounters:  08/16/17 173 lb 4 oz (78.6 kg)  08/02/17 173 lb 11.2 oz (78.8 kg)  07/12/17 175 lb 6.4 oz (79.6 kg)   .

## 2017-08-12 ENCOUNTER — Ambulatory Visit (HOSPITAL_COMMUNITY)
Admission: RE | Admit: 2017-08-12 | Discharge: 2017-08-12 | Disposition: A | Discharge status: Home / Self Care | Payer: Medicare Other | Source: Ambulatory Visit | Attending: Radiation Oncology | Admitting: Radiation Oncology

## 2017-08-12 DIAGNOSIS — C801 Malignant (primary) neoplasm, unspecified: Secondary | ICD-10-CM | POA: Diagnosis not present

## 2017-08-12 DIAGNOSIS — C7949 Secondary malignant neoplasm of other parts of nervous system: Secondary | ICD-10-CM | POA: Diagnosis present

## 2017-08-12 DIAGNOSIS — C7931 Secondary malignant neoplasm of brain: Secondary | ICD-10-CM | POA: Insufficient documentation

## 2017-08-12 MED ORDER — GADOBENATE DIMEGLUMINE 529 MG/ML IV SOLN
15.0000 mL | Freq: Once | INTRAVENOUS | Status: AC | PRN
  Administered 2017-08-12: 15 mL via INTRAVENOUS

## 2017-08-16 ENCOUNTER — Ambulatory Visit
Admission: RE | Admit: 2017-08-16 | Discharge: 2017-08-16 | Disposition: A | Discharge status: Home / Self Care | Payer: Medicare Other | Source: Ambulatory Visit | Attending: Urology | Admitting: Urology

## 2017-08-16 ENCOUNTER — Other Ambulatory Visit: Payer: Self-pay

## 2017-08-16 ENCOUNTER — Ambulatory Visit (HOSPITAL_BASED_OUTPATIENT_CLINIC_OR_DEPARTMENT_OTHER)
Admission: RE | Admit: 2017-08-16 | Discharge: 2017-08-16 | Disposition: A | Discharge status: Home / Self Care | Payer: Medicare Other | Source: Ambulatory Visit | Attending: Urology | Admitting: Urology

## 2017-08-16 ENCOUNTER — Encounter: Payer: Self-pay | Admitting: Radiation Oncology

## 2017-08-16 VITALS — BP 111/77 | HR 84 | Temp 97.7°F | Resp 18 | Wt 173.2 lb

## 2017-08-16 DIAGNOSIS — C7931 Secondary malignant neoplasm of brain: Secondary | ICD-10-CM | POA: Diagnosis not present

## 2017-08-16 DIAGNOSIS — Z90710 Acquired absence of both cervix and uterus: Secondary | ICD-10-CM | POA: Insufficient documentation

## 2017-08-16 DIAGNOSIS — Z808 Family history of malignant neoplasm of other organs or systems: Secondary | ICD-10-CM | POA: Insufficient documentation

## 2017-08-16 DIAGNOSIS — C3491 Malignant neoplasm of unspecified part of right bronchus or lung: Secondary | ICD-10-CM | POA: Insufficient documentation

## 2017-08-16 DIAGNOSIS — G4733 Obstructive sleep apnea (adult) (pediatric): Secondary | ICD-10-CM | POA: Diagnosis not present

## 2017-08-16 DIAGNOSIS — Z87891 Personal history of nicotine dependence: Secondary | ICD-10-CM | POA: Insufficient documentation

## 2017-08-16 DIAGNOSIS — Z7189 Other specified counseling: Secondary | ICD-10-CM | POA: Diagnosis not present

## 2017-08-16 DIAGNOSIS — Z853 Personal history of malignant neoplasm of breast: Secondary | ICD-10-CM | POA: Diagnosis not present

## 2017-08-16 DIAGNOSIS — Z87440 Personal history of urinary (tract) infections: Secondary | ICD-10-CM | POA: Diagnosis not present

## 2017-08-16 DIAGNOSIS — Z823 Family history of stroke: Secondary | ICD-10-CM | POA: Insufficient documentation

## 2017-08-16 DIAGNOSIS — Z923 Personal history of irradiation: Secondary | ICD-10-CM | POA: Insufficient documentation

## 2017-08-16 DIAGNOSIS — Z515 Encounter for palliative care: Secondary | ICD-10-CM

## 2017-08-16 DIAGNOSIS — Z961 Presence of intraocular lens: Secondary | ICD-10-CM | POA: Insufficient documentation

## 2017-08-16 DIAGNOSIS — Z9842 Cataract extraction status, left eye: Secondary | ICD-10-CM | POA: Insufficient documentation

## 2017-08-16 DIAGNOSIS — Z86010 Personal history of colonic polyps: Secondary | ICD-10-CM | POA: Insufficient documentation

## 2017-08-16 DIAGNOSIS — K219 Gastro-esophageal reflux disease without esophagitis: Secondary | ICD-10-CM | POA: Insufficient documentation

## 2017-08-16 DIAGNOSIS — Z9221 Personal history of antineoplastic chemotherapy: Secondary | ICD-10-CM | POA: Diagnosis not present

## 2017-08-16 DIAGNOSIS — C3490 Malignant neoplasm of unspecified part of unspecified bronchus or lung: Secondary | ICD-10-CM | POA: Diagnosis not present

## 2017-08-16 DIAGNOSIS — Z9841 Cataract extraction status, right eye: Secondary | ICD-10-CM | POA: Insufficient documentation

## 2017-08-16 DIAGNOSIS — Z9011 Acquired absence of right breast and nipple: Secondary | ICD-10-CM | POA: Insufficient documentation

## 2017-08-16 DIAGNOSIS — C7971 Secondary malignant neoplasm of right adrenal gland: Secondary | ICD-10-CM | POA: Diagnosis not present

## 2017-08-16 DIAGNOSIS — Z85828 Personal history of other malignant neoplasm of skin: Secondary | ICD-10-CM | POA: Insufficient documentation

## 2017-08-16 DIAGNOSIS — Z79899 Other long term (current) drug therapy: Secondary | ICD-10-CM | POA: Diagnosis not present

## 2017-08-16 DIAGNOSIS — Z8711 Personal history of peptic ulcer disease: Secondary | ICD-10-CM | POA: Diagnosis not present

## 2017-08-16 DIAGNOSIS — Z7901 Long term (current) use of anticoagulants: Secondary | ICD-10-CM | POA: Diagnosis not present

## 2017-08-16 DIAGNOSIS — Z8249 Family history of ischemic heart disease and other diseases of the circulatory system: Secondary | ICD-10-CM | POA: Insufficient documentation
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
## 2017-08-23 ENCOUNTER — Inpatient Hospital Stay: Payer: Medicare Other | Admitting: Internal Medicine

## 2017-08-23 ENCOUNTER — Inpatient Hospital Stay: Payer: Medicare Other

## 2017-08-23 ENCOUNTER — Encounter: Payer: Self-pay | Admitting: Internal Medicine

## 2017-08-23 ENCOUNTER — Telehealth: Payer: Self-pay | Admitting: Internal Medicine

## 2017-08-23 VITALS — BP 107/68 | HR 73 | Temp 97.9°F | Resp 18 | Ht 62.0 in | Wt 175.5 lb

## 2017-08-23 DIAGNOSIS — Z853 Personal history of malignant neoplasm of breast: Secondary | ICD-10-CM | POA: Diagnosis not present

## 2017-08-23 DIAGNOSIS — Z9221 Personal history of antineoplastic chemotherapy: Secondary | ICD-10-CM

## 2017-08-23 DIAGNOSIS — Z79899 Other long term (current) drug therapy: Secondary | ICD-10-CM

## 2017-08-23 DIAGNOSIS — M7989 Other specified soft tissue disorders: Secondary | ICD-10-CM | POA: Diagnosis not present

## 2017-08-23 DIAGNOSIS — Z171 Estrogen receptor negative status [ER-]: Secondary | ICD-10-CM | POA: Diagnosis not present

## 2017-08-23 DIAGNOSIS — T451X5A Adverse effect of antineoplastic and immunosuppressive drugs, initial encounter: Secondary | ICD-10-CM

## 2017-08-23 DIAGNOSIS — C801 Malignant (primary) neoplasm, unspecified: Secondary | ICD-10-CM

## 2017-08-23 DIAGNOSIS — C7931 Secondary malignant neoplasm of brain: Secondary | ICD-10-CM | POA: Diagnosis not present

## 2017-08-23 DIAGNOSIS — Z95828 Presence of other vascular implants and grafts: Secondary | ICD-10-CM

## 2017-08-23 DIAGNOSIS — Z923 Personal history of irradiation: Secondary | ICD-10-CM

## 2017-08-23 DIAGNOSIS — Z515 Encounter for palliative care: Secondary | ICD-10-CM | POA: Insufficient documentation

## 2017-08-23 DIAGNOSIS — C3491 Malignant neoplasm of unspecified part of right bronchus or lung: Secondary | ICD-10-CM

## 2017-08-23 DIAGNOSIS — C7972 Secondary malignant neoplasm of left adrenal gland: Secondary | ICD-10-CM

## 2017-08-23 DIAGNOSIS — Z9011 Acquired absence of right breast and nipple: Secondary | ICD-10-CM | POA: Diagnosis not present

## 2017-08-23 DIAGNOSIS — D6481 Anemia due to antineoplastic chemotherapy: Secondary | ICD-10-CM

## 2017-08-23 DIAGNOSIS — C3431 Malignant neoplasm of lower lobe, right bronchus or lung: Secondary | ICD-10-CM

## 2017-08-23 LAB — COMPREHENSIVE METABOLIC PANEL
ALBUMIN: 3.3 g/dL — AB (ref 3.5–5.0)
ALK PHOS: 43 U/L (ref 40–150)
ALT: 19 U/L (ref 0–55)
AST: 24 U/L (ref 5–34)
Anion gap: 9 (ref 3–11)
BILIRUBIN TOTAL: 0.6 mg/dL (ref 0.2–1.2)
BUN: 23 mg/dL (ref 7–26)
CALCIUM: 9.7 mg/dL (ref 8.4–10.4)
CO2: 24 mmol/L (ref 22–29)
CREATININE: 0.71 mg/dL (ref 0.60–1.10)
Chloride: 105 mmol/L (ref 98–109)
GFR calc Af Amer: >60 mL/min (ref >60)
GFR calc non Af Amer: >60 mL/min (ref >60)
GLUCOSE: 102 mg/dL (ref 70–140)
Potassium: 4.4 mmol/L (ref 3.3–4.7)
Sodium: 138 mmol/L (ref 136–145)
Total Protein: 6.1 g/dL — ABNORMAL LOW (ref 6.4–8.3)

## 2017-08-23 LAB — CBC WITH DIFFERENTIAL/PLATELET
BASOS ABS: 0 10*3/uL (ref 0.0–0.1)
BASOS PCT: 0 %
EOS PCT: 0 %
Eosinophils Absolute: 0 10*3/uL (ref 0.0–0.5)
HCT: 35.5 % (ref 34.8–46.6)
Hemoglobin: 11.9 g/dL (ref 11.6–15.9)
Lymphocytes Relative: 7 %
Lymphs Abs: 0.4 10*3/uL — ABNORMAL LOW (ref 0.9–3.3)
MCH: 31.7 pg (ref 25.1–34.0)
MCHC: 33.5 g/dL (ref 31.5–36.0)
MCV: 94.7 fL (ref 79.5–101.0)
MONO ABS: 0.3 10*3/uL (ref 0.1–0.9)
MONOS PCT: 5 %
Neutro Abs: 5 10*3/uL (ref 1.5–6.5)
Neutrophils Relative %: 88 %
PLATELETS: 195 10*3/uL (ref 145–400)
RBC: 3.75 MIL/uL (ref 3.70–5.45)
RDW: 16.5 % — AB (ref 11.2–16.1)
WBC: 5.6 10*3/uL (ref 3.9–10.3)

## 2017-08-23 MED ORDER — FOLIC ACID 1 MG PO TABS: 1.0000 mg | ORAL_TABLET | Freq: Every day | ORAL | 0 refills | Status: DC

## 2017-08-23 MED ORDER — PROCHLORPERAZINE MALEATE 10 MG PO TABS
ORAL_TABLET | ORAL | Status: AC
  Filled 2017-08-23: qty 1

## 2017-08-23 MED ORDER — CYANOCOBALAMIN 1000 MCG/ML IJ SOLN
1000.0000 ug | Freq: Once | INTRAMUSCULAR | Status: AC
  Administered 2017-08-23: 1000 ug via INTRAMUSCULAR

## 2017-08-23 MED ORDER — CYANOCOBALAMIN 1000 MCG/ML IJ SOLN
INTRAMUSCULAR | Status: AC
  Filled 2017-08-23: qty 1

## 2017-08-23 MED ORDER — SODIUM CHLORIDE 0.9 % IV SOLN
500.0000 mg/m2 | Freq: Once | INTRAVENOUS | Status: AC
  Administered 2017-08-23: 900 mg via INTRAVENOUS
  Filled 2017-08-23: qty 20

## 2017-08-23 MED ORDER — DEXAMETHASONE 4 MG PO TABS: ORAL_TABLET | ORAL | 1 refills | Status: DC

## 2017-08-23 MED ORDER — PROCHLORPERAZINE MALEATE 10 MG PO TABS
10.0000 mg | ORAL_TABLET | Freq: Once | ORAL | Status: AC
  Administered 2017-08-23: 10 mg via ORAL

## 2017-08-23 MED ORDER — SODIUM CHLORIDE 0.9 % IV SOLN
Freq: Once | INTRAVENOUS | Status: AC
  Administered 2017-08-23: 13:00:00 via INTRAVENOUS

## 2017-08-23 MED ORDER — HEPARIN SOD (PORK) LOCK FLUSH 100 UNIT/ML IV SOLN
500.0000 [IU] | Freq: Once | INTRAVENOUS | Status: AC | PRN
  Administered 2017-08-23: 500 [IU]
  Filled 2017-08-23: qty 5

## 2017-08-23 MED ORDER — SODIUM CHLORIDE 0.9% FLUSH
10.0000 mL | INTRAVENOUS | Status: DC | PRN
  Administered 2017-08-23: 10 mL via INTRAVENOUS
  Filled 2017-08-23: qty 10

## 2017-08-23 MED ORDER — SODIUM CHLORIDE 0.9% FLUSH
10.0000 mL | INTRAVENOUS | Status: DC | PRN
  Administered 2017-08-23: 10 mL
  Filled 2017-08-23: qty 10
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
## 2017-08-23 NOTE — Telephone Encounter (Signed)
No additional appts to add per 1/28 los 3 cycles already scheduled .- Gave patient AVS and calender per los.

## 2017-08-23 NOTE — Patient Instructions (Signed)
Implanted Port Home Guide An implanted port is a type of central line that is placed under the skin. Central lines are used to provide IV access when treatment or nutrition needs to be given through a person's veins. Implanted ports are used for long-term IV access. An implanted port may be placed because:  You need IV medicine that would be irritating to the small veins in your hands or arms.  You need long-term IV medicines, such as antibiotics.  You need IV nutrition for a long period.  You need frequent blood draws for lab tests.  You need dialysis.  Implanted ports are usually placed in the chest area, but they can also be placed in the upper arm, the abdomen, or the leg. An implanted port has two main parts:  Reservoir. The reservoir is round and will appear as a small, raised area under your skin. The reservoir is the part where a needle is inserted to give medicines or draw blood.  Catheter. The catheter is a thin, flexible tube that extends from the reservoir. The catheter is placed into a large vein. Medicine that is inserted into the reservoir goes into the catheter and then into the vein.  How will I care for my incision site? Do not get the incision site wet. Bathe or shower as directed by your health care provider. How is my port accessed? Special steps must be taken to access the port:  Before the port is accessed, a numbing cream can be placed on the skin. This helps numb the skin over the port site.  Your health care provider uses a sterile technique to access the port. ? Your health care provider must put on a mask and sterile gloves. ? The skin over your port is cleaned carefully with an antiseptic and allowed to dry. ? The port is gently pinched between sterile gloves, and a needle is inserted into the port.  Only "non-coring" port needles should be used to access the port. Once the port is accessed, a blood return should be checked. This helps ensure that the port  is in the vein and is not clogged.  If your port needs to remain accessed for a constant infusion, a clear (transparent) bandage will be placed over the needle site. The bandage and needle will need to be changed every week, or as directed by your health care provider.  Keep the bandage covering the needle clean and dry. Do not get it wet. Follow your health care provider's instructions on how to take a shower or bath while the port is accessed.  If your port does not need to stay accessed, no bandage is needed over the port.  What is flushing? Flushing helps keep the port from getting clogged. Follow your health care provider's instructions on how and when to flush the port. Ports are usually flushed with saline solution or a medicine called heparin. The need for flushing will depend on how the port is used.  If the port is used for intermittent medicines or blood draws, the port will need to be flushed: ? After medicines have been given. ? After blood has been drawn. ? As part of routine maintenance.  If a constant infusion is running, the port may not need to be flushed.  How long will my port stay implanted? The port can stay in for as long as your health care provider thinks it is needed. When it is time for the port to come out, surgery will be   done to remove it. The procedure is similar to the one performed when the port was put in. When should I seek immediate medical care? When you have an implanted port, you should seek immediate medical care if:  You notice a bad smell coming from the incision site.  You have swelling, redness, or drainage at the incision site.  You have more swelling or pain at the port site or the surrounding area.  You have a fever that is not controlled with medicine.  This information is not intended to replace advice given to you by your health care provider. Make sure you discuss any questions you have with your health care provider. Document  Released: 07/13/2005 Document Revised: 12/19/2015 Document Reviewed: 03/20/2013 Elsevier Interactive Patient Education  2017 [employer]  

## 2017-08-23 NOTE — Patient Instructions (Signed)
[facility] Discharge Instructions for Patients Receiving Chemotherapy  Today you received the following chemotherapy agent:  Alimta.  To help prevent nausea and vomiting after your treatment, we encourage you to take your nausea medication as directed.   If you develop nausea and vomiting that is not controlled by your nausea medication, call the clinic.   BELOW ARE SYMPTOMS THAT SHOULD BE REPORTED IMMEDIATELY:  *FEVER GREATER THAN 100.5 F  *CHILLS WITH OR WITHOUT FEVER  NAUSEA AND VOMITING THAT IS NOT CONTROLLED WITH YOUR NAUSEA MEDICATION  *UNUSUAL SHORTNESS OF BREATH  *UNUSUAL BRUISING OR BLEEDING  TENDERNESS IN MOUTH AND THROAT WITH OR WITHOUT PRESENCE OF ULCERS  *URINARY PROBLEMS  *BOWEL PROBLEMS  UNUSUAL RASH Items with * indicate a potential emergency and should be followed up as soon as possible.  Feel free to call the clinic should you have any questions or concerns. The clinic phone number is [phone].  Please show the [facility] at check-in to the Emergency Department and triage nurse.

## 2017-09-01 ENCOUNTER — Telehealth: Payer: Self-pay | Admitting: Medical Oncology

## 2017-09-01 NOTE — Telephone Encounter (Signed)
Per Molly Black I told Dr Laurann Montana that Molly Black prefers she stay on lovenox b/c of lung cancer.

## 2017-09-02 ENCOUNTER — Other Ambulatory Visit: Payer: Self-pay | Admitting: Radiation Therapy

## 2017-09-02 DIAGNOSIS — C7949 Secondary malignant neoplasm of other parts of nervous system: Principal | ICD-10-CM

## 2017-09-02 DIAGNOSIS — C7931 Secondary malignant neoplasm of brain: Secondary | ICD-10-CM

## 2017-09-03 MED ORDER — PEGFILGRASTIM INJECTION 6 MG/0.6ML ~~LOC~~
PREFILLED_SYRINGE | SUBCUTANEOUS | Status: AC
  Filled 2017-09-03: qty 0.6

## 2017-09-10 ENCOUNTER — Other Ambulatory Visit: Payer: Self-pay | Admitting: Oncology

## 2017-09-10 DIAGNOSIS — C3491 Malignant neoplasm of unspecified part of right bronchus or lung: Secondary | ICD-10-CM

## 2017-09-13 ENCOUNTER — Encounter: Payer: Self-pay | Admitting: Internal Medicine

## 2017-09-13 ENCOUNTER — Inpatient Hospital Stay: Payer: Medicare Other

## 2017-09-13 ENCOUNTER — Inpatient Hospital Stay: Payer: Medicare Other | Attending: Urology | Admitting: Internal Medicine

## 2017-09-13 ENCOUNTER — Telehealth: Payer: Self-pay | Admitting: Internal Medicine

## 2017-09-13 VITALS — BP 117/74 | HR 74 | Temp 97.6°F | Resp 19 | Ht 62.0 in | Wt 174.1 lb

## 2017-09-13 DIAGNOSIS — Z85828 Personal history of other malignant neoplasm of skin: Secondary | ICD-10-CM | POA: Insufficient documentation

## 2017-09-13 DIAGNOSIS — Z853 Personal history of malignant neoplasm of breast: Secondary | ICD-10-CM | POA: Insufficient documentation

## 2017-09-13 DIAGNOSIS — K449 Diaphragmatic hernia without obstruction or gangrene: Secondary | ICD-10-CM | POA: Insufficient documentation

## 2017-09-13 DIAGNOSIS — C7972 Secondary malignant neoplasm of left adrenal gland: Secondary | ICD-10-CM | POA: Insufficient documentation

## 2017-09-13 DIAGNOSIS — G629 Polyneuropathy, unspecified: Secondary | ICD-10-CM | POA: Diagnosis not present

## 2017-09-13 DIAGNOSIS — K219 Gastro-esophageal reflux disease without esophagitis: Secondary | ICD-10-CM | POA: Diagnosis not present

## 2017-09-13 DIAGNOSIS — C7931 Secondary malignant neoplasm of brain: Secondary | ICD-10-CM

## 2017-09-13 DIAGNOSIS — Z5111 Encounter for antineoplastic chemotherapy: Secondary | ICD-10-CM

## 2017-09-13 DIAGNOSIS — Z79899 Other long term (current) drug therapy: Secondary | ICD-10-CM | POA: Insufficient documentation

## 2017-09-13 DIAGNOSIS — C801 Malignant (primary) neoplasm, unspecified: Secondary | ICD-10-CM

## 2017-09-13 DIAGNOSIS — Z95828 Presence of other vascular implants and grafts: Secondary | ICD-10-CM

## 2017-09-13 DIAGNOSIS — C3431 Malignant neoplasm of lower lobe, right bronchus or lung: Secondary | ICD-10-CM | POA: Diagnosis not present

## 2017-09-13 DIAGNOSIS — Z87440 Personal history of urinary (tract) infections: Secondary | ICD-10-CM | POA: Diagnosis not present

## 2017-09-13 DIAGNOSIS — F419 Anxiety disorder, unspecified: Secondary | ICD-10-CM | POA: Diagnosis not present

## 2017-09-13 DIAGNOSIS — C3490 Malignant neoplasm of unspecified part of unspecified bronchus or lung: Secondary | ICD-10-CM

## 2017-09-13 DIAGNOSIS — Z9011 Acquired absence of right breast and nipple: Secondary | ICD-10-CM | POA: Diagnosis not present

## 2017-09-13 DIAGNOSIS — C3491 Malignant neoplasm of unspecified part of right bronchus or lung: Secondary | ICD-10-CM

## 2017-09-13 DIAGNOSIS — G4733 Obstructive sleep apnea (adult) (pediatric): Secondary | ICD-10-CM | POA: Diagnosis not present

## 2017-09-13 LAB — CMP (CANCER CENTER ONLY)
ALBUMIN: 3.2 g/dL — AB (ref 3.5–5.0)
ALT: 18 U/L (ref 0–55)
ANION GAP: 9 (ref 3–11)
AST: 23 U/L (ref 5–34)
Alkaline Phosphatase: 42 U/L (ref 40–150)
BILIRUBIN TOTAL: 0.6 mg/dL (ref 0.2–1.2)
BUN: 21 mg/dL (ref 7–26)
CHLORIDE: 105 mmol/L (ref 98–109)
CO2: 22 mmol/L (ref 22–29)
Calcium: 9.8 mg/dL (ref 8.4–10.4)
Creatinine: 0.75 mg/dL (ref 0.60–1.10)
GFR, Est AFR Am: >60 mL/min (ref >60)
GFR, Estimated: >60 mL/min (ref >60)
GLUCOSE: 141 mg/dL — AB (ref 70–140)
Potassium: 4 mmol/L (ref 3.5–5.1)
SODIUM: 136 mmol/L (ref 136–145)
Total Protein: 6.1 g/dL — ABNORMAL LOW (ref 6.4–8.3)

## 2017-09-13 LAB — CBC WITH DIFFERENTIAL (CANCER CENTER ONLY)
BASOS ABS: 0 10*3/uL (ref 0.0–0.1)
BASOS PCT: 0 %
EOS ABS: 0 10*3/uL (ref 0.0–0.5)
Eosinophils Relative: 0 %
HCT: 34.3 % — ABNORMAL LOW (ref 34.8–46.6)
Hemoglobin: 11.6 g/dL (ref 11.6–15.9)
Lymphocytes Relative: 4 %
Lymphs Abs: 0.2 10*3/uL — ABNORMAL LOW (ref 0.9–3.3)
MCH: 31.6 pg (ref 25.1–34.0)
MCHC: 33.9 g/dL (ref 31.5–36.0)
MCV: 93.2 fL (ref 79.5–101.0)
MONO ABS: 0.3 10*3/uL (ref 0.1–0.9)
Monocytes Relative: 6 %
NEUTROS ABS: 4.6 10*3/uL (ref 1.5–6.5)
Neutrophils Relative %: 90 %
PLATELETS: 180 10*3/uL (ref 145–400)
RBC: 3.68 MIL/uL — ABNORMAL LOW (ref 3.70–5.45)
RDW: 17.1 % — AB (ref 11.2–14.5)
WBC Count: 5.2 10*3/uL (ref 3.9–10.3)

## 2017-09-13 MED ORDER — PROCHLORPERAZINE MALEATE 10 MG PO TABS
ORAL_TABLET | ORAL | Status: AC
  Filled 2017-09-13: qty 1

## 2017-09-13 MED ORDER — SODIUM CHLORIDE 0.9 % IV SOLN
Freq: Once | INTRAVENOUS | Status: AC
  Administered 2017-09-13: 12:00:00 via INTRAVENOUS

## 2017-09-13 MED ORDER — SODIUM CHLORIDE 0.9% FLUSH
10.0000 mL | INTRAVENOUS | Status: DC | PRN
  Administered 2017-09-13: 10 mL via INTRAVENOUS
  Filled 2017-09-13: qty 10

## 2017-09-13 MED ORDER — PROCHLORPERAZINE MALEATE 10 MG PO TABS
10.0000 mg | ORAL_TABLET | Freq: Once | ORAL | Status: AC
  Administered 2017-09-13: 10 mg via ORAL

## 2017-09-13 MED ORDER — SODIUM CHLORIDE 0.9% FLUSH
10.0000 mL | INTRAVENOUS | Status: DC | PRN
  Administered 2017-09-13: 10 mL
  Filled 2017-09-13: qty 10

## 2017-09-13 MED ORDER — PEMETREXED DISODIUM CHEMO INJECTION 500 MG
500.0000 mg/m2 | Freq: Once | INTRAVENOUS | Status: AC
  Administered 2017-09-13: 900 mg via INTRAVENOUS
  Filled 2017-09-13: qty 20

## 2017-09-13 MED ORDER — HEPARIN SOD (PORK) LOCK FLUSH 100 UNIT/ML IV SOLN
500.0000 [IU] | Freq: Once | INTRAVENOUS | Status: AC | PRN
  Administered 2017-09-13: 500 [IU]
  Filled 2017-09-13: qty 5

## 2017-09-13 NOTE — Patient Instructions (Signed)
[facility] Discharge Instructions for Patients Receiving Chemotherapy  Today you received the following chemotherapy agent:  Alimta.  To help prevent nausea and vomiting after your treatment, we encourage you to take your nausea medication as directed.   If you develop nausea and vomiting that is not controlled by your nausea medication, call the clinic.   BELOW ARE SYMPTOMS THAT SHOULD BE REPORTED IMMEDIATELY:  *FEVER GREATER THAN 100.5 F  *CHILLS WITH OR WITHOUT FEVER  NAUSEA AND VOMITING THAT IS NOT CONTROLLED WITH YOUR NAUSEA MEDICATION  *UNUSUAL SHORTNESS OF BREATH  *UNUSUAL BRUISING OR BLEEDING  TENDERNESS IN MOUTH AND THROAT WITH OR WITHOUT PRESENCE OF ULCERS  *URINARY PROBLEMS  *BOWEL PROBLEMS  UNUSUAL RASH Items with * indicate a potential emergency and should be followed up as soon as possible.  Feel free to call the clinic should you have any questions or concerns. The clinic phone number is [phone].  Please show the [facility] at check-in to the Emergency Department and triage nurse.
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
## 2017-09-13 NOTE — Telephone Encounter (Signed)
Scheduled appt per 2/18 los - Gave patient aVS and calender per los. Central radiology to contact pt with ct schedule.

## 2017-10-01 ENCOUNTER — Encounter (HOSPITAL_COMMUNITY): Payer: Self-pay | Admitting: Radiology

## 2017-10-01 ENCOUNTER — Ambulatory Visit (HOSPITAL_COMMUNITY)
Admission: RE | Admit: 2017-10-01 | Discharge: 2017-10-01 | Disposition: A | Discharge status: Home / Self Care | Payer: Medicare Other | Source: Ambulatory Visit | Attending: Internal Medicine | Admitting: Internal Medicine

## 2017-10-01 DIAGNOSIS — Y842 Radiological procedure and radiotherapy as the cause of abnormal reaction of the patient, or of later complication, without mention of misadventure at the time of the procedure: Secondary | ICD-10-CM | POA: Insufficient documentation

## 2017-10-01 DIAGNOSIS — C3490 Malignant neoplasm of unspecified part of unspecified bronchus or lung: Secondary | ICD-10-CM | POA: Diagnosis present

## 2017-10-01 MED ORDER — SODIUM CHLORIDE 0.9 % IJ SOLN
INTRAMUSCULAR | Status: AC
  Filled 2017-10-01: qty 50

## 2017-10-01 MED ORDER — HEPARIN SOD (PORK) LOCK FLUSH 100 UNIT/ML IV SOLN
500.0000 [IU] | Freq: Once | INTRAVENOUS | Status: AC
  Administered 2017-10-01: 500 [IU] via INTRAVENOUS

## 2017-10-01 MED ORDER — HEPARIN SOD (PORK) LOCK FLUSH 100 UNIT/ML IV SOLN
INTRAVENOUS | Status: AC
  Filled 2017-10-01: qty 5

## 2017-10-01 MED ORDER — IOPAMIDOL (ISOVUE-300) INJECTION 61%
INTRAVENOUS | Status: AC
Start: 2017-10-01 — End: 2017-10-01
  Administered 2017-10-01: 100 mL via INTRAVENOUS
  Filled 2017-10-01: qty 100

## 2017-10-04 ENCOUNTER — Inpatient Hospital Stay: Payer: Medicare Other

## 2017-10-04 ENCOUNTER — Telehealth: Payer: Self-pay

## 2017-10-04 ENCOUNTER — Encounter: Payer: Self-pay | Admitting: Internal Medicine

## 2017-10-04 ENCOUNTER — Inpatient Hospital Stay: Payer: Medicare Other | Attending: Urology | Admitting: Internal Medicine

## 2017-10-04 VITALS — BP 123/68 | HR 79 | Temp 97.7°F | Resp 20 | Ht 62.0 in | Wt 177.1 lb

## 2017-10-04 DIAGNOSIS — R42 Dizziness and giddiness: Secondary | ICD-10-CM | POA: Diagnosis not present

## 2017-10-04 DIAGNOSIS — C3431 Malignant neoplasm of lower lobe, right bronchus or lung: Secondary | ICD-10-CM | POA: Insufficient documentation

## 2017-10-04 DIAGNOSIS — C801 Malignant (primary) neoplasm, unspecified: Secondary | ICD-10-CM

## 2017-10-04 DIAGNOSIS — Z923 Personal history of irradiation: Secondary | ICD-10-CM | POA: Diagnosis not present

## 2017-10-04 DIAGNOSIS — C7972 Secondary malignant neoplasm of left adrenal gland: Secondary | ICD-10-CM

## 2017-10-04 DIAGNOSIS — C3491 Malignant neoplasm of unspecified part of right bronchus or lung: Secondary | ICD-10-CM

## 2017-10-04 DIAGNOSIS — C7931 Secondary malignant neoplasm of brain: Secondary | ICD-10-CM | POA: Insufficient documentation

## 2017-10-04 DIAGNOSIS — Z9221 Personal history of antineoplastic chemotherapy: Secondary | ICD-10-CM | POA: Insufficient documentation

## 2017-10-04 DIAGNOSIS — Z5111 Encounter for antineoplastic chemotherapy: Secondary | ICD-10-CM | POA: Insufficient documentation

## 2017-10-04 DIAGNOSIS — Z95828 Presence of other vascular implants and grafts: Secondary | ICD-10-CM

## 2017-10-04 DIAGNOSIS — C3490 Malignant neoplasm of unspecified part of unspecified bronchus or lung: Secondary | ICD-10-CM

## 2017-10-04 LAB — CMP (CANCER CENTER ONLY)
ALT: 24 U/L (ref 0–55)
ANION GAP: 8 (ref 3–11)
AST: 25 U/L (ref 5–34)
Albumin: 3.4 g/dL — ABNORMAL LOW (ref 3.5–5.0)
Alkaline Phosphatase: 40 U/L (ref 40–150)
BUN: 20 mg/dL (ref 7–26)
CO2: 25 mmol/L (ref 22–29)
Calcium: 10.4 mg/dL (ref 8.4–10.4)
Chloride: 104 mmol/L (ref 98–109)
Creatinine: 0.71 mg/dL (ref 0.60–1.10)
GFR, Est AFR Am: >60 mL/min (ref >60)
Glucose, Bld: 120 mg/dL (ref 70–140)
POTASSIUM: 4.4 mmol/L (ref 3.5–5.1)
Sodium: 137 mmol/L (ref 136–145)
Total Bilirubin: 0.7 mg/dL (ref 0.2–1.2)
Total Protein: 6.3 g/dL — ABNORMAL LOW (ref 6.4–8.3)

## 2017-10-04 LAB — CBC WITH DIFFERENTIAL (CANCER CENTER ONLY)
BASOS ABS: 0 10*3/uL (ref 0.0–0.1)
BASOS PCT: 0 %
EOS PCT: 0 %
Eosinophils Absolute: 0 10*3/uL (ref 0.0–0.5)
HCT: 36 % (ref 34.8–46.6)
Hemoglobin: 12.2 g/dL (ref 11.6–15.9)
LYMPHS PCT: 5 %
Lymphs Abs: 0.3 10*3/uL — ABNORMAL LOW (ref 0.9–3.3)
MCH: 31.8 pg (ref 25.1–34.0)
MCHC: 33.8 g/dL (ref 31.5–36.0)
MCV: 94 fL (ref 79.5–101.0)
Monocytes Absolute: 0.2 10*3/uL (ref 0.1–0.9)
Monocytes Relative: 4 %
NEUTROS ABS: 5.6 10*3/uL (ref 1.5–6.5)
Neutrophils Relative %: 91 %
PLATELETS: 177 10*3/uL (ref 145–400)
RBC: 3.83 MIL/uL (ref 3.70–5.45)
RDW: 17.2 % — ABNORMAL HIGH (ref 11.2–14.5)
WBC: 6.1 10*3/uL (ref 3.9–10.3)

## 2017-10-04 MED ORDER — SODIUM CHLORIDE 0.9% FLUSH
10.0000 mL | INTRAVENOUS | Status: DC | PRN
  Administered 2017-10-04: 10 mL via INTRAVENOUS
  Filled 2017-10-04: qty 10

## 2017-10-04 MED ORDER — SODIUM CHLORIDE 0.9% FLUSH
10.0000 mL | INTRAVENOUS | Status: DC | PRN
  Administered 2017-10-04: 10 mL
  Filled 2017-10-04: qty 10

## 2017-10-04 MED ORDER — HEPARIN SOD (PORK) LOCK FLUSH 100 UNIT/ML IV SOLN
500.0000 [IU] | Freq: Once | INTRAVENOUS | Status: AC | PRN
  Administered 2017-10-04: 500 [IU]
  Filled 2017-10-04: qty 5

## 2017-10-04 MED ORDER — SODIUM CHLORIDE 0.9 % IV SOLN
500.0000 mg/m2 | Freq: Once | INTRAVENOUS | Status: AC
  Administered 2017-10-04: 900 mg via INTRAVENOUS
  Filled 2017-10-04: qty 20

## 2017-10-04 MED ORDER — SODIUM CHLORIDE 0.9 % IV SOLN
Freq: Once | INTRAVENOUS | Status: AC
  Administered 2017-10-04: 13:00:00 via INTRAVENOUS

## 2017-10-04 MED ORDER — PROCHLORPERAZINE MALEATE 10 MG PO TABS
10.0000 mg | ORAL_TABLET | Freq: Once | ORAL | Status: AC
  Administered 2017-10-04: 10 mg via ORAL

## 2017-10-04 MED ORDER — PROCHLORPERAZINE MALEATE 10 MG PO TABS
ORAL_TABLET | ORAL | Status: AC
  Filled 2017-10-04: qty 1

## 2017-10-04 NOTE — Patient Instructions (Signed)
[facility] Discharge Instructions for Patients Receiving Chemotherapy  Today you received the following chemotherapy agent:  Alimta.  To help prevent nausea and vomiting after your treatment, we encourage you to take your nausea medication as directed.   If you develop nausea and vomiting that is not controlled by your nausea medication, call the clinic.   BELOW ARE SYMPTOMS THAT SHOULD BE REPORTED IMMEDIATELY:  *FEVER GREATER THAN 100.5 F  *CHILLS WITH OR WITHOUT FEVER  NAUSEA AND VOMITING THAT IS NOT CONTROLLED WITH YOUR NAUSEA MEDICATION  *UNUSUAL SHORTNESS OF BREATH  *UNUSUAL BRUISING OR BLEEDING  TENDERNESS IN MOUTH AND THROAT WITH OR WITHOUT PRESENCE OF ULCERS  *URINARY PROBLEMS  *BOWEL PROBLEMS  UNUSUAL RASH Items with * indicate a potential emergency and should be followed up as soon as possible.  Feel free to call the clinic should you have any questions or concerns. The clinic phone number is [phone].  Please show the [facility] at check-in to the Emergency Department and triage nurse.
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
## 2017-10-04 NOTE — Telephone Encounter (Signed)
Printed avs and calender of upcoming appointment . Per 3/11 los

## 2017-10-05 ENCOUNTER — Other Ambulatory Visit: Payer: Self-pay | Admitting: Adult Health

## 2017-10-05 DIAGNOSIS — C801 Malignant (primary) neoplasm, unspecified: Secondary | ICD-10-CM

## 2017-10-05 DIAGNOSIS — C7931 Secondary malignant neoplasm of brain: Secondary | ICD-10-CM

## 2017-10-05 DIAGNOSIS — C3491 Malignant neoplasm of unspecified part of right bronchus or lung: Secondary | ICD-10-CM

## 2017-10-05 DIAGNOSIS — C7972 Secondary malignant neoplasm of left adrenal gland: Secondary | ICD-10-CM

## 2017-10-05 DIAGNOSIS — Z5111 Encounter for antineoplastic chemotherapy: Secondary | ICD-10-CM

## 2017-10-14 ENCOUNTER — Telehealth: Payer: Self-pay | Admitting: Medical Oncology

## 2017-10-14 NOTE — Telephone Encounter (Signed)
Hoarseness , mucous in throat , 'taking a cold". I called back and left message to treat with OTC cold symptoms. Call back in am with update.

## 2017-10-18 ENCOUNTER — Other Ambulatory Visit: Payer: Self-pay | Admitting: Internal Medicine

## 2017-10-25 ENCOUNTER — Inpatient Hospital Stay: Payer: Medicare Other

## 2017-10-25 ENCOUNTER — Telehealth: Payer: Self-pay | Admitting: Internal Medicine

## 2017-10-25 ENCOUNTER — Encounter: Payer: Self-pay | Admitting: Internal Medicine

## 2017-10-25 ENCOUNTER — Inpatient Hospital Stay: Payer: Medicare Other | Attending: Urology | Admitting: Internal Medicine

## 2017-10-25 VITALS — BP 102/81 | HR 82 | Temp 97.5°F | Resp 16 | Ht 62.0 in | Wt 179.1 lb

## 2017-10-25 DIAGNOSIS — Z79899 Other long term (current) drug therapy: Secondary | ICD-10-CM | POA: Diagnosis not present

## 2017-10-25 DIAGNOSIS — K219 Gastro-esophageal reflux disease without esophagitis: Secondary | ICD-10-CM | POA: Diagnosis not present

## 2017-10-25 DIAGNOSIS — Z9221 Personal history of antineoplastic chemotherapy: Secondary | ICD-10-CM | POA: Insufficient documentation

## 2017-10-25 DIAGNOSIS — Z95828 Presence of other vascular implants and grafts: Secondary | ICD-10-CM

## 2017-10-25 DIAGNOSIS — C3491 Malignant neoplasm of unspecified part of right bronchus or lung: Secondary | ICD-10-CM

## 2017-10-25 DIAGNOSIS — C3431 Malignant neoplasm of lower lobe, right bronchus or lung: Secondary | ICD-10-CM | POA: Diagnosis not present

## 2017-10-25 DIAGNOSIS — C7972 Secondary malignant neoplasm of left adrenal gland: Secondary | ICD-10-CM | POA: Insufficient documentation

## 2017-10-25 DIAGNOSIS — Z171 Estrogen receptor negative status [ER-]: Secondary | ICD-10-CM | POA: Diagnosis not present

## 2017-10-25 DIAGNOSIS — C7931 Secondary malignant neoplasm of brain: Secondary | ICD-10-CM | POA: Insufficient documentation

## 2017-10-25 DIAGNOSIS — F419 Anxiety disorder, unspecified: Secondary | ICD-10-CM | POA: Insufficient documentation

## 2017-10-25 DIAGNOSIS — Z923 Personal history of irradiation: Secondary | ICD-10-CM | POA: Insufficient documentation

## 2017-10-25 DIAGNOSIS — Z9011 Acquired absence of right breast and nipple: Secondary | ICD-10-CM | POA: Diagnosis not present

## 2017-10-25 DIAGNOSIS — Z5111 Encounter for antineoplastic chemotherapy: Secondary | ICD-10-CM | POA: Diagnosis not present

## 2017-10-25 DIAGNOSIS — C801 Malignant (primary) neoplasm, unspecified: Principal | ICD-10-CM

## 2017-10-25 DIAGNOSIS — Z853 Personal history of malignant neoplasm of breast: Secondary | ICD-10-CM

## 2017-10-25 LAB — CMP (CANCER CENTER ONLY)
ALT: 21 U/L (ref 0–55)
ANION GAP: 7 (ref 3–11)
AST: 23 U/L (ref 5–34)
Albumin: 3.3 g/dL — ABNORMAL LOW (ref 3.5–5.0)
Alkaline Phosphatase: 39 U/L — ABNORMAL LOW (ref 40–150)
BILIRUBIN TOTAL: 0.5 mg/dL (ref 0.2–1.2)
BUN: 19 mg/dL (ref 7–26)
CHLORIDE: 107 mmol/L (ref 98–109)
CO2: 24 mmol/L (ref 22–29)
Calcium: 10 mg/dL (ref 8.4–10.4)
Creatinine: 0.69 mg/dL (ref 0.60–1.10)
GFR, Estimated: >60 mL/min (ref >60)
Glucose, Bld: 87 mg/dL (ref 70–140)
POTASSIUM: 4.3 mmol/L (ref 3.5–5.1)
Sodium: 138 mmol/L (ref 136–145)
TOTAL PROTEIN: 6.2 g/dL — AB (ref 6.4–8.3)

## 2017-10-25 LAB — CBC WITH DIFFERENTIAL (CANCER CENTER ONLY)
BASOS ABS: 0 10*3/uL (ref 0.0–0.1)
Basophils Relative: 0 %
EOS PCT: 0 %
Eosinophils Absolute: 0 10*3/uL (ref 0.0–0.5)
HCT: 36.8 % (ref 34.8–46.6)
HEMOGLOBIN: 12.6 g/dL (ref 11.6–15.9)
LYMPHS ABS: 0.3 10*3/uL — AB (ref 0.9–3.3)
Lymphocytes Relative: 5 %
MCH: 32 pg (ref 25.1–34.0)
MCHC: 34.2 g/dL (ref 31.5–36.0)
MCV: 93.5 fL (ref 79.5–101.0)
Monocytes Absolute: 0.4 10*3/uL (ref 0.1–0.9)
Monocytes Relative: 7 %
NEUTROS ABS: 4.9 10*3/uL (ref 1.5–6.5)
NEUTROS PCT: 88 %
PLATELETS: 176 10*3/uL (ref 145–400)
RBC: 3.94 MIL/uL (ref 3.70–5.45)
RDW: 17.1 % — ABNORMAL HIGH (ref 11.2–14.5)
WBC: 5.6 10*3/uL (ref 3.9–10.3)

## 2017-10-25 MED ORDER — HEPARIN SOD (PORK) LOCK FLUSH 100 UNIT/ML IV SOLN
500.0000 [IU] | Freq: Once | INTRAVENOUS | Status: AC | PRN
Start: 2017-10-25 — End: 2017-10-25
  Administered 2017-10-25: 500 [IU]
  Filled 2017-10-25: qty 5

## 2017-10-25 MED ORDER — PEMETREXED DISODIUM CHEMO INJECTION 500 MG
500.0000 mg/m2 | Freq: Once | INTRAVENOUS | Status: AC
  Administered 2017-10-25: 900 mg via INTRAVENOUS
  Filled 2017-10-25: qty 20

## 2017-10-25 MED ORDER — PROCHLORPERAZINE MALEATE 10 MG PO TABS
10.0000 mg | ORAL_TABLET | Freq: Once | ORAL | Status: AC
  Administered 2017-10-25: 10 mg via ORAL

## 2017-10-25 MED ORDER — SODIUM CHLORIDE 0.9 % IV SOLN
Freq: Once | INTRAVENOUS | Status: AC
  Administered 2017-10-25: 13:00:00 via INTRAVENOUS

## 2017-10-25 MED ORDER — CYANOCOBALAMIN 1000 MCG/ML IJ SOLN
INTRAMUSCULAR | Status: AC
Start: 2017-10-25 — End: 2017-10-25
  Filled 2017-10-25: qty 1

## 2017-10-25 MED ORDER — SODIUM CHLORIDE 0.9% FLUSH
10.0000 mL | INTRAVENOUS | Status: DC | PRN
  Administered 2017-10-25: 10 mL via INTRAVENOUS
  Filled 2017-10-25: qty 10

## 2017-10-25 MED ORDER — SODIUM CHLORIDE 0.9% FLUSH
10.0000 mL | INTRAVENOUS | Status: DC | PRN
  Administered 2017-10-25: 10 mL
  Filled 2017-10-25: qty 10

## 2017-10-25 MED ORDER — PROCHLORPERAZINE MALEATE 10 MG PO TABS
ORAL_TABLET | ORAL | Status: AC
Start: 2017-10-25 — End: 2017-10-25
  Filled 2017-10-25: qty 1

## 2017-10-25 MED ORDER — CYANOCOBALAMIN 1000 MCG/ML IJ SOLN
1000.0000 ug | Freq: Once | INTRAMUSCULAR | Status: AC
  Administered 2017-10-25: 1000 ug via INTRAMUSCULAR

## 2017-10-25 NOTE — Patient Instructions (Signed)
[facility] Discharge Instructions for Patients Receiving Chemotherapy  Today you received the following chemotherapy agents alimta  To help prevent nausea and vomiting after your treatment, we encourage you to take your nausea medication as directed  If you develop nausea and vomiting that is not controlled by your nausea medication, call the clinic.   BELOW ARE SYMPTOMS THAT SHOULD BE REPORTED IMMEDIATELY:  *FEVER GREATER THAN 100.5 F  *CHILLS WITH OR WITHOUT FEVER  NAUSEA AND VOMITING THAT IS NOT CONTROLLED WITH YOUR NAUSEA MEDICATION  *UNUSUAL SHORTNESS OF BREATH  *UNUSUAL BRUISING OR BLEEDING  TENDERNESS IN MOUTH AND THROAT WITH OR WITHOUT PRESENCE OF ULCERS  *URINARY PROBLEMS  *BOWEL PROBLEMS  UNUSUAL RASH Items with * indicate a potential emergency and should be followed up as soon as possible.  Feel free to call the clinic you have any questions or concerns. The clinic phone number is [phone].

## 2017-10-25 NOTE — Telephone Encounter (Signed)
Appointments scheduled AVS/Calendar printed per 4/1 los °
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
## 2017-10-26 ENCOUNTER — Telehealth: Payer: Self-pay | Admitting: Medical Oncology

## 2017-10-26 NOTE — Telephone Encounter (Signed)
appts confirmed.

## 2017-11-04 ENCOUNTER — Other Ambulatory Visit: Payer: Self-pay | Admitting: Internal Medicine

## 2017-11-04 ENCOUNTER — Other Ambulatory Visit: Payer: Self-pay | Admitting: Adult Health

## 2017-11-04 DIAGNOSIS — C801 Malignant (primary) neoplasm, unspecified: Secondary | ICD-10-CM

## 2017-11-04 DIAGNOSIS — C7972 Secondary malignant neoplasm of left adrenal gland: Secondary | ICD-10-CM

## 2017-11-04 DIAGNOSIS — Z1231 Encounter for screening mammogram for malignant neoplasm of breast: Secondary | ICD-10-CM

## 2017-11-04 DIAGNOSIS — C3491 Malignant neoplasm of unspecified part of right bronchus or lung: Secondary | ICD-10-CM

## 2017-11-04 DIAGNOSIS — C7931 Secondary malignant neoplasm of brain: Secondary | ICD-10-CM

## 2017-11-04 DIAGNOSIS — Z5111 Encounter for antineoplastic chemotherapy: Secondary | ICD-10-CM

## 2017-11-15 ENCOUNTER — Telehealth: Payer: Self-pay | Admitting: Internal Medicine

## 2017-11-15 ENCOUNTER — Encounter: Payer: Self-pay | Admitting: Internal Medicine

## 2017-11-15 ENCOUNTER — Inpatient Hospital Stay: Payer: Medicare Other

## 2017-11-15 ENCOUNTER — Ambulatory Visit (HOSPITAL_COMMUNITY): Payer: Medicare Other

## 2017-11-15 ENCOUNTER — Inpatient Hospital Stay: Payer: Medicare Other | Admitting: Internal Medicine

## 2017-11-15 VITALS — BP 119/70 | HR 74 | Temp 98.0°F | Resp 18 | Ht 62.0 in | Wt 178.8 lb

## 2017-11-15 DIAGNOSIS — Z95828 Presence of other vascular implants and grafts: Secondary | ICD-10-CM

## 2017-11-15 DIAGNOSIS — Z9221 Personal history of antineoplastic chemotherapy: Secondary | ICD-10-CM | POA: Diagnosis not present

## 2017-11-15 DIAGNOSIS — Z853 Personal history of malignant neoplasm of breast: Secondary | ICD-10-CM | POA: Diagnosis not present

## 2017-11-15 DIAGNOSIS — C7931 Secondary malignant neoplasm of brain: Secondary | ICD-10-CM | POA: Diagnosis not present

## 2017-11-15 DIAGNOSIS — C3491 Malignant neoplasm of unspecified part of right bronchus or lung: Secondary | ICD-10-CM

## 2017-11-15 DIAGNOSIS — Z79899 Other long term (current) drug therapy: Secondary | ICD-10-CM

## 2017-11-15 DIAGNOSIS — Z9011 Acquired absence of right breast and nipple: Secondary | ICD-10-CM

## 2017-11-15 DIAGNOSIS — C3431 Malignant neoplasm of lower lobe, right bronchus or lung: Secondary | ICD-10-CM | POA: Diagnosis not present

## 2017-11-15 DIAGNOSIS — C7972 Secondary malignant neoplasm of left adrenal gland: Secondary | ICD-10-CM

## 2017-11-15 DIAGNOSIS — Z923 Personal history of irradiation: Secondary | ICD-10-CM

## 2017-11-15 DIAGNOSIS — C801 Malignant (primary) neoplasm, unspecified: Secondary | ICD-10-CM

## 2017-11-15 DIAGNOSIS — Z5111 Encounter for antineoplastic chemotherapy: Secondary | ICD-10-CM | POA: Diagnosis not present

## 2017-11-15 DIAGNOSIS — Z171 Estrogen receptor negative status [ER-]: Secondary | ICD-10-CM

## 2017-11-15 LAB — CMP (CANCER CENTER ONLY)
ALT: 18 U/L (ref 0–55)
AST: 21 U/L (ref 5–34)
Albumin: 3.3 g/dL — ABNORMAL LOW (ref 3.5–5.0)
Alkaline Phosphatase: 40 U/L (ref 40–150)
Anion gap: 8 (ref 3–11)
BUN: 16 mg/dL (ref 7–26)
CHLORIDE: 106 mmol/L (ref 98–109)
CO2: 23 mmol/L (ref 22–29)
CREATININE: 0.67 mg/dL (ref 0.60–1.10)
Calcium: 9.7 mg/dL (ref 8.4–10.4)
GFR, Est AFR Am: >60 mL/min (ref >60)
Glucose, Bld: 81 mg/dL (ref 70–140)
POTASSIUM: 4.1 mmol/L (ref 3.5–5.1)
SODIUM: 137 mmol/L (ref 136–145)
Total Bilirubin: 0.5 mg/dL (ref 0.2–1.2)
Total Protein: 6.1 g/dL — ABNORMAL LOW (ref 6.4–8.3)

## 2017-11-15 LAB — CBC WITH DIFFERENTIAL (CANCER CENTER ONLY)
Basophils Absolute: 0 10*3/uL (ref 0.0–0.1)
Basophils Relative: 0 %
Eosinophils Absolute: 0 10*3/uL (ref 0.0–0.5)
Eosinophils Relative: 0 %
HCT: 35.7 % (ref 34.8–46.6)
HEMOGLOBIN: 12 g/dL (ref 11.6–15.9)
LYMPHS ABS: 0.3 10*3/uL — AB (ref 0.9–3.3)
LYMPHS PCT: 4 %
MCH: 31.9 pg (ref 25.1–34.0)
MCHC: 33.6 g/dL (ref 31.5–36.0)
MCV: 94.9 fL (ref 79.5–101.0)
MONOS PCT: 7 %
Monocytes Absolute: 0.5 10*3/uL (ref 0.1–0.9)
Neutro Abs: 6.1 10*3/uL (ref 1.5–6.5)
Neutrophils Relative %: 89 %
Platelet Count: 152 10*3/uL (ref 145–400)
RBC: 3.76 MIL/uL (ref 3.70–5.45)
RDW: 16.8 % — ABNORMAL HIGH (ref 11.2–14.5)
WBC: 6.9 10*3/uL (ref 3.9–10.3)

## 2017-11-15 MED ORDER — SODIUM CHLORIDE 0.9 % IV SOLN
Freq: Once | INTRAVENOUS | Status: AC
  Administered 2017-11-15: 15:00:00 via INTRAVENOUS

## 2017-11-15 MED ORDER — PROCHLORPERAZINE MALEATE 10 MG PO TABS
ORAL_TABLET | ORAL | Status: AC
  Filled 2017-11-15: qty 1

## 2017-11-15 MED ORDER — SODIUM CHLORIDE 0.9% FLUSH
10.0000 mL | INTRAVENOUS | Status: DC | PRN
  Administered 2017-11-15: 10 mL
  Filled 2017-11-15: qty 10

## 2017-11-15 MED ORDER — SODIUM CHLORIDE 0.9% FLUSH
10.0000 mL | INTRAVENOUS | Status: DC | PRN
  Administered 2017-11-15: 10 mL via INTRAVENOUS
  Filled 2017-11-15: qty 10

## 2017-11-15 MED ORDER — HEPARIN SOD (PORK) LOCK FLUSH 100 UNIT/ML IV SOLN
500.0000 [IU] | Freq: Once | INTRAVENOUS | Status: AC | PRN
  Administered 2017-11-15: 500 [IU]
  Filled 2017-11-15: qty 5

## 2017-11-15 MED ORDER — PROCHLORPERAZINE MALEATE 10 MG PO TABS
10.0000 mg | ORAL_TABLET | Freq: Once | ORAL | Status: AC
  Administered 2017-11-15: 10 mg via ORAL

## 2017-11-15 MED ORDER — SODIUM CHLORIDE 0.9 % IV SOLN
500.0000 mg/m2 | Freq: Once | INTRAVENOUS | Status: AC
  Administered 2017-11-15: 900 mg via INTRAVENOUS
  Filled 2017-11-15: qty 20

## 2017-11-15 NOTE — Telephone Encounter (Signed)
3 cycles already scheduled per 4/22 los.

## 2017-11-15 NOTE — Patient Instructions (Signed)
[facility] Discharge Instructions for Patients Receiving Chemotherapy  Today you received the following chemotherapy agents Alimta.  To help prevent nausea and vomiting after your treatment, we encourage you to take your nausea medication as directed.   If you develop nausea and vomiting that is not controlled by your nausea medication, call the clinic.   BELOW ARE SYMPTOMS THAT SHOULD BE REPORTED IMMEDIATELY:  *FEVER GREATER THAN 100.5 F  *CHILLS WITH OR WITHOUT FEVER  NAUSEA AND VOMITING THAT IS NOT CONTROLLED WITH YOUR NAUSEA MEDICATION  *UNUSUAL SHORTNESS OF BREATH  *UNUSUAL BRUISING OR BLEEDING  TENDERNESS IN MOUTH AND THROAT WITH OR WITHOUT PRESENCE OF ULCERS  *URINARY PROBLEMS  *BOWEL PROBLEMS  UNUSUAL RASH Items with * indicate a potential emergency and should be followed up as soon as possible.  Feel free to call the clinic should you have any questions or concerns. The clinic phone number is [phone].  Please show the [name] at check-in to the Emergency Department and triage nurse.
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
## 2017-11-16 ENCOUNTER — Ambulatory Visit (HOSPITAL_COMMUNITY)
Admission: RE | Admit: 2017-11-16 | Discharge: 2017-11-16 | Disposition: A | Discharge status: Home / Self Care | Payer: Medicare Other | Source: Ambulatory Visit | Attending: Radiation Oncology | Admitting: Radiation Oncology

## 2017-11-16 DIAGNOSIS — C7931 Secondary malignant neoplasm of brain: Secondary | ICD-10-CM | POA: Insufficient documentation

## 2017-11-16 DIAGNOSIS — C801 Malignant (primary) neoplasm, unspecified: Secondary | ICD-10-CM | POA: Insufficient documentation

## 2017-11-16 DIAGNOSIS — C7949 Secondary malignant neoplasm of other parts of nervous system: Secondary | ICD-10-CM | POA: Insufficient documentation

## 2017-11-16 MED ORDER — GADOBENATE DIMEGLUMINE 529 MG/ML IV SOLN
18.0000 mL | Freq: Once | INTRAVENOUS | Status: AC | PRN
  Administered 2017-11-16: 18 mL via INTRAVENOUS

## 2017-11-16 MED ORDER — HEPARIN SOD (PORK) LOCK FLUSH 100 UNIT/ML IV SOLN
500.0000 [IU] | INTRAVENOUS | Status: AC | PRN
  Administered 2017-11-16: 500 [IU]

## 2017-11-17 ENCOUNTER — Telehealth: Payer: Self-pay | Admitting: Radiation Oncology

## 2017-11-17 ENCOUNTER — Ambulatory Visit: Payer: Self-pay | Admitting: Radiation Oncology

## 2017-11-17 ENCOUNTER — Other Ambulatory Visit: Payer: Self-pay | Admitting: *Deleted

## 2017-11-17 MED ORDER — FOLIC ACID 1 MG PO TABS: 1.0000 mg | ORAL_TABLET | Freq: Every day | ORAL | 0 refills | Status: DC

## 2017-11-17 NOTE — Telephone Encounter (Signed)
I spoke with the patient and we reviewed her brain MRI results. She will follow up with Dr. Julien Nordmann and continues on Alimta. She will return in 3 months for repeat MRI brain.

## 2017-11-22 ENCOUNTER — Other Ambulatory Visit: Payer: Self-pay | Admitting: Radiation Therapy

## 2017-11-22 DIAGNOSIS — C7931 Secondary malignant neoplasm of brain: Secondary | ICD-10-CM

## 2017-11-22 DIAGNOSIS — C7949 Secondary malignant neoplasm of other parts of nervous system: Principal | ICD-10-CM

## 2017-11-26 ENCOUNTER — Telehealth: Payer: Self-pay | Admitting: Medical Oncology

## 2017-11-26 NOTE — Telephone Encounter (Signed)
Pain Swelling on left foot>right and increased swelling in hands -" I cannot get on my rings ". She reports pain  top of L foot and in calf  when she flexes her foot. I instructed pt to contact PCP.

## 2017-11-30 ENCOUNTER — Encounter (HOSPITAL_COMMUNITY): Payer: Self-pay | Admitting: Emergency Medicine

## 2017-11-30 ENCOUNTER — Emergency Department (HOSPITAL_COMMUNITY): Payer: Medicare Other

## 2017-11-30 ENCOUNTER — Emergency Department (HOSPITAL_COMMUNITY)
Admission: EM | Admit: 2017-11-30 | Discharge: 2017-12-01 | Disposition: A | Discharge status: Home / Self Care | Payer: Medicare Other | Attending: Emergency Medicine | Admitting: Emergency Medicine

## 2017-11-30 ENCOUNTER — Other Ambulatory Visit: Payer: Self-pay

## 2017-11-30 DIAGNOSIS — N3000 Acute cystitis without hematuria: Secondary | ICD-10-CM | POA: Diagnosis not present

## 2017-11-30 DIAGNOSIS — Z79899 Other long term (current) drug therapy: Secondary | ICD-10-CM | POA: Insufficient documentation

## 2017-11-30 DIAGNOSIS — Z87891 Personal history of nicotine dependence: Secondary | ICD-10-CM | POA: Insufficient documentation

## 2017-11-30 DIAGNOSIS — R600 Localized edema: Secondary | ICD-10-CM | POA: Insufficient documentation

## 2017-11-30 DIAGNOSIS — Z85118 Personal history of other malignant neoplasm of bronchus and lung: Secondary | ICD-10-CM | POA: Diagnosis not present

## 2017-11-30 DIAGNOSIS — Z7901 Long term (current) use of anticoagulants: Secondary | ICD-10-CM | POA: Insufficient documentation

## 2017-11-30 DIAGNOSIS — R2243 Localized swelling, mass and lump, lower limb, bilateral: Secondary | ICD-10-CM | POA: Diagnosis present

## 2017-11-30 DIAGNOSIS — Z85828 Personal history of other malignant neoplasm of skin: Secondary | ICD-10-CM | POA: Diagnosis not present

## 2017-11-30 DIAGNOSIS — R609 Edema, unspecified: Secondary | ICD-10-CM

## 2017-11-30 MED ORDER — LIDOCAINE-PRILOCAINE 2.5-2.5 % EX CREA
TOPICAL_CREAM | Freq: Once | CUTANEOUS | Status: AC
  Administered 2017-11-30: via TOPICAL
  Filled 2017-11-30: qty 5
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
## 2017-11-30 NOTE — ED Triage Notes (Addendum)
Patient c/o bilateral legs, arms, and facial swelling "for months," worsening since Friday. Called PCP Friday and states she was prescribed 20mg  Lasix to take for three days with no relief. Hx DVT. Redness noted to LLE. Denies SOB. Speaking in full sentences without difficulty. Hx lung cancer. Last chemo x3 weeks ago.

## 2017-12-01 ENCOUNTER — Ambulatory Visit (HOSPITAL_BASED_OUTPATIENT_CLINIC_OR_DEPARTMENT_OTHER)
Admission: RE | Admit: 2017-12-01 | Discharge: 2017-12-01 | Disposition: A | Discharge status: Home / Self Care | Payer: Medicare Other | Source: Ambulatory Visit | Attending: Emergency Medicine | Admitting: Emergency Medicine

## 2017-12-01 DIAGNOSIS — R609 Edema, unspecified: Secondary | ICD-10-CM

## 2017-12-01 LAB — URINALYSIS, ROUTINE W REFLEX MICROSCOPIC
BILIRUBIN URINE: NEGATIVE
Glucose, UA: NEGATIVE mg/dL
KETONES UR: NEGATIVE mg/dL
LEUKOCYTES UA: NEGATIVE
NITRITE: POSITIVE — AB
PH: 6 (ref 5.0–8.0)
Protein, ur: NEGATIVE mg/dL
SPECIFIC GRAVITY, URINE: 1.01 (ref 1.005–1.030)

## 2017-12-01 LAB — COMPREHENSIVE METABOLIC PANEL
ALT: 27 U/L (ref 14–54)
AST: 33 U/L (ref 15–41)
Albumin: 3.2 g/dL — ABNORMAL LOW (ref 3.5–5.0)
Alkaline Phosphatase: 33 U/L — ABNORMAL LOW (ref 38–126)
Anion gap: 10 (ref 5–15)
BUN: 14 mg/dL (ref 6–20)
CHLORIDE: 104 mmol/L (ref 101–111)
CO2: 25 mmol/L (ref 22–32)
CREATININE: 0.57 mg/dL (ref 0.44–1.00)
Calcium: 9.5 mg/dL (ref 8.9–10.3)
GFR calc Af Amer: >60 mL/min (ref >60)
GFR calc non Af Amer: >60 mL/min (ref >60)
Glucose, Bld: 106 mg/dL — ABNORMAL HIGH (ref 65–99)
Potassium: 3.7 mmol/L (ref 3.5–5.1)
SODIUM: 139 mmol/L (ref 135–145)
Total Bilirubin: 0.8 mg/dL (ref 0.3–1.2)
Total Protein: 5.9 g/dL — ABNORMAL LOW (ref 6.5–8.1)

## 2017-12-01 LAB — CBC WITH DIFFERENTIAL/PLATELET
BASOS ABS: 0 10*3/uL (ref 0.0–0.1)
BASOS PCT: 0 %
Eosinophils Absolute: 0.1 10*3/uL (ref 0.0–0.7)
Eosinophils Relative: 2 %
HEMATOCRIT: 35.6 % — AB (ref 36.0–46.0)
HEMOGLOBIN: 12.1 g/dL (ref 12.0–15.0)
Lymphocytes Relative: 9 %
Lymphs Abs: 0.3 10*3/uL — ABNORMAL LOW (ref 0.7–4.0)
MCH: 31.8 pg (ref 26.0–34.0)
MCHC: 34 g/dL (ref 30.0–36.0)
MCV: 93.7 fL (ref 78.0–100.0)
Monocytes Absolute: 0.8 10*3/uL (ref 0.1–1.0)
Monocytes Relative: 22 %
NEUTROS ABS: 2.6 10*3/uL (ref 1.7–7.7)
NEUTROS PCT: 67 %
Platelets: 156 10*3/uL (ref 150–400)
RBC: 3.8 MIL/uL — AB (ref 3.87–5.11)
RDW: 17 % — AB (ref 11.5–15.5)
WBC: 3.8 10*3/uL — AB (ref 4.0–10.5)

## 2017-12-01 LAB — PROTIME-INR
INR: 0.89
PROTHROMBIN TIME: 11.9 s (ref 11.4–15.2)

## 2017-12-01 LAB — APTT: aPTT: 52 seconds — ABNORMAL HIGH (ref 24–36)

## 2017-12-01 MED ORDER — HEPARIN SOD (PORK) LOCK FLUSH 100 UNIT/ML IV SOLN
500.0000 [IU] | Freq: Once | INTRAVENOUS | Status: AC
  Administered 2017-12-01: 500 [IU]
  Filled 2017-12-01: qty 5

## 2017-12-01 MED ORDER — FUROSEMIDE 40 MG PO TABS: 40.0000 mg | ORAL_TABLET | Freq: Every day | ORAL | 0 refills | Status: DC

## 2017-12-01 MED ORDER — CEPHALEXIN 500 MG PO CAPS: 500.0000 mg | ORAL_CAPSULE | Freq: Two times a day (BID) | ORAL | 0 refills | Status: DC

## 2017-12-01 MED ORDER — FUROSEMIDE 40 MG PO TABS
40.0000 mg | ORAL_TABLET | Freq: Once | ORAL | Status: AC
  Administered 2017-12-01: 40 mg via ORAL
  Filled 2017-12-01: qty 1

## 2017-12-01 NOTE — Discharge Instructions (Addendum)
An order has been placed for an ultrasound of the legs to check for blood clot.   Begin taking the increased dose of Lasix for the next 5 days.  Begin weighing yourself daily and recording the weights.  Please take all of your antibiotics until finished!   You may develop abdominal discomfort or diarrhea from the antibiotic.  You may help offset this with probiotics which you can buy or get in yogurt. Do not eat or take the probiotics until 2 hours after your antibiotic.   Follow-up with your primary care provider as soon as possible on this matter.  There is also evidence of a UTI on the urine testing. Please take all of your antibiotics until finished!   You may develop abdominal discomfort or diarrhea from the antibiotic.  You may help offset this with probiotics which you can buy or get in yogurt. Do not eat or take the probiotics until 2 hours after your antibiotic.   Return to the ED for shortness of breath, chest pain, fever, or any other major concerns.

## 2017-12-01 NOTE — Progress Notes (Signed)
VASCULAR LAB PRELIMINARY  PRELIMINARY  PRELIMINARY  PRELIMINARY  Bilateral lower extremity venous duplex completed.    Preliminary report:  There is no DVT or SVT noted in the bilateral lower extremities.   Jaydalynn Olivero, RVT 12/01/2017, 2:21 PM

## 2017-12-03 ENCOUNTER — Other Ambulatory Visit: Payer: Self-pay | Admitting: Adult Health

## 2017-12-03 ENCOUNTER — Telehealth: Payer: Self-pay

## 2017-12-03 DIAGNOSIS — Z5111 Encounter for antineoplastic chemotherapy: Secondary | ICD-10-CM

## 2017-12-03 DIAGNOSIS — C3491 Malignant neoplasm of unspecified part of right bronchus or lung: Secondary | ICD-10-CM

## 2017-12-03 DIAGNOSIS — C7972 Secondary malignant neoplasm of left adrenal gland: Secondary | ICD-10-CM

## 2017-12-03 DIAGNOSIS — C7931 Secondary malignant neoplasm of brain: Secondary | ICD-10-CM

## 2017-12-03 DIAGNOSIS — C801 Malignant (primary) neoplasm, unspecified: Secondary | ICD-10-CM

## 2017-12-03 LAB — URINE CULTURE: Culture: >=100000 — AB

## 2017-12-03 NOTE — Telephone Encounter (Signed)
err

## 2017-12-03 NOTE — Assessment & Plan Note (Addendum)
This is a very pleasant 77 year old white female with metastatic non-small cell lung cancer, adenocarcinoma with metastasis to the brain and adrenal gland. She underwent initial course of concurrent chemoradiation to the locally advanced disease in the chest as well as a stereotactic radiotherapy to the solitary adrenal lesions before she developed multiple brain metastases and she was treated with stereotactic radiotherapy to the brain lesions. The patient was treated with systemic chemotherapy with carboplatin, Alimta and Avastin status post 5 cycles. Avastin was discontinued at cycle #5 secondary to hypersensitivity reaction. The patient is currently undergoing maintenance treatment with single agent Alimta status post 16 cycles. The patient continues to do well with no concerning complaints except for increased edema.  The patient was seen with Dr. Julien Nordmann.  Discussed with the patient that the edema is not likely related to her Alimta.  Recommend that she proceed with cycle #17 as scheduled today.  The patient will have a restaging CT scan of the chest, abdomen, pelvis prior to her next visit.  She will follow-up in 3 weeks for evaluation prior to cycle #18 and to review her restaging CT scan results.  For her edema, recommend she continue to follow with her primary care provider for further management of this.  She has a 2D echocardiogram scheduled in the near future.  She was advised to call immediately if she has any concerning symptoms in the interval. The patient voices understanding of current disease status and treatment options and is in agreement with the current care plan. All questions were answered. The patient knows to call the clinic with any problems, questions or concerns. We can certainly see the patient much sooner if necessary.
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
## 2017-12-04 ENCOUNTER — Telehealth: Payer: Self-pay

## 2017-12-04 NOTE — Telephone Encounter (Signed)
Post ED Visit - Positive Culture Follow-up  Culture report reviewed by antimicrobial stewardship pharmacist:  []  Elenor Quinones, Pharm.D. []  Heide Guile, Pharm.D., BCPS AQ-ID []  Parks Neptune, Pharm.D., BCPS []  Alycia Rossetti, Pharm.D., BCPS []  [place], [place].D., BCPS, AAHIVP []  Legrand Como, Pharm.D., BCPS, AAHIVP [x]  Salome Arnt, PharmD, BCPS []  Wynell Balloon, PharmD []  Vincenza Hews, PharmD, BCPS  Positive urine culture Treated with Cephalexin, organism sensitive to the same and no further patient follow-up is required at this time.  Genia Del 12/04/2017, 10:15 AM

## 2017-12-06 ENCOUNTER — Telehealth: Payer: Self-pay | Admitting: Oncology

## 2017-12-06 ENCOUNTER — Encounter: Payer: Self-pay | Admitting: Oncology

## 2017-12-06 ENCOUNTER — Ambulatory Visit: Payer: Medicare Other

## 2017-12-06 ENCOUNTER — Inpatient Hospital Stay: Payer: Medicare Other | Attending: Urology

## 2017-12-06 ENCOUNTER — Other Ambulatory Visit: Payer: Medicare Other

## 2017-12-06 ENCOUNTER — Inpatient Hospital Stay: Payer: Medicare Other

## 2017-12-06 ENCOUNTER — Ambulatory Visit: Payer: Medicare Other | Admitting: Internal Medicine

## 2017-12-06 ENCOUNTER — Inpatient Hospital Stay: Payer: Medicare Other | Admitting: Oncology

## 2017-12-06 VITALS — BP 121/72 | HR 77 | Temp 97.6°F | Resp 20 | Ht 62.0 in | Wt 179.7 lb

## 2017-12-06 DIAGNOSIS — C3491 Malignant neoplasm of unspecified part of right bronchus or lung: Secondary | ICD-10-CM

## 2017-12-06 DIAGNOSIS — C7972 Secondary malignant neoplasm of left adrenal gland: Secondary | ICD-10-CM

## 2017-12-06 DIAGNOSIS — Z923 Personal history of irradiation: Secondary | ICD-10-CM

## 2017-12-06 DIAGNOSIS — Z5111 Encounter for antineoplastic chemotherapy: Secondary | ICD-10-CM | POA: Insufficient documentation

## 2017-12-06 DIAGNOSIS — C7931 Secondary malignant neoplasm of brain: Secondary | ICD-10-CM | POA: Diagnosis not present

## 2017-12-06 DIAGNOSIS — C7971 Secondary malignant neoplasm of right adrenal gland: Secondary | ICD-10-CM | POA: Diagnosis not present

## 2017-12-06 LAB — CMP (CANCER CENTER ONLY)
ALT: 19 U/L (ref 0–55)
ANION GAP: 6 (ref 3–11)
AST: 22 U/L (ref 5–34)
Albumin: 3.4 g/dL — ABNORMAL LOW (ref 3.5–5.0)
Alkaline Phosphatase: 41 U/L (ref 40–150)
BILIRUBIN TOTAL: 0.6 mg/dL (ref 0.2–1.2)
BUN: 19 mg/dL (ref 7–26)
CHLORIDE: 106 mmol/L (ref 98–109)
CO2: 26 mmol/L (ref 22–29)
CREATININE: 0.68 mg/dL (ref 0.60–1.10)
Calcium: 9.6 mg/dL (ref 8.4–10.4)
Glucose, Bld: 69 mg/dL — ABNORMAL LOW (ref 70–140)
POTASSIUM: 4.3 mmol/L (ref 3.5–5.1)
Sodium: 138 mmol/L (ref 136–145)
Total Protein: 6.1 g/dL — ABNORMAL LOW (ref 6.4–8.3)

## 2017-12-06 LAB — CBC WITH DIFFERENTIAL (CANCER CENTER ONLY)
Basophils Absolute: 0 10*3/uL (ref 0.0–0.1)
Basophils Relative: 0 %
Eosinophils Absolute: 0 10*3/uL (ref 0.0–0.5)
Eosinophils Relative: 0 %
HCT: 37 % (ref 34.8–46.6)
HEMOGLOBIN: 12.6 g/dL (ref 11.6–15.9)
LYMPHS ABS: 0.7 10*3/uL — AB (ref 0.9–3.3)
Lymphocytes Relative: 9 %
MCH: 32.1 pg (ref 25.1–34.0)
MCHC: 34.1 g/dL (ref 31.5–36.0)
MCV: 94.4 fL (ref 79.5–101.0)
Monocytes Absolute: 0.3 10*3/uL (ref 0.1–0.9)
Monocytes Relative: 4 %
NEUTROS PCT: 87 %
Neutro Abs: 6.5 10*3/uL (ref 1.5–6.5)
Platelet Count: 192 10*3/uL (ref 145–400)
RBC: 3.92 MIL/uL (ref 3.70–5.45)
RDW: 16.2 % — ABNORMAL HIGH (ref 11.2–14.5)
WBC: 7.5 10*3/uL (ref 3.9–10.3)

## 2017-12-06 MED ORDER — SODIUM CHLORIDE 0.9% FLUSH
10.0000 mL | INTRAVENOUS | Status: DC | PRN
  Administered 2017-12-06: 10 mL
  Filled 2017-12-06: qty 10

## 2017-12-06 MED ORDER — SODIUM CHLORIDE 0.9 % IV SOLN
500.0000 mg/m2 | Freq: Once | INTRAVENOUS | Status: AC
  Administered 2017-12-06: 900 mg via INTRAVENOUS
  Filled 2017-12-06: qty 20

## 2017-12-06 MED ORDER — PROCHLORPERAZINE MALEATE 10 MG PO TABS
10.0000 mg | ORAL_TABLET | Freq: Once | ORAL | Status: AC
  Administered 2017-12-06: 10 mg via ORAL

## 2017-12-06 MED ORDER — HEPARIN SOD (PORK) LOCK FLUSH 100 UNIT/ML IV SOLN
500.0000 [IU] | Freq: Once | INTRAVENOUS | Status: AC | PRN
  Administered 2017-12-06: 500 [IU]
  Filled 2017-12-06: qty 5

## 2017-12-06 MED ORDER — SODIUM CHLORIDE 0.9 % IV SOLN
Freq: Once | INTRAVENOUS | Status: AC
  Administered 2017-12-06: 12:00:00 via INTRAVENOUS

## 2017-12-06 MED ORDER — PROCHLORPERAZINE MALEATE 10 MG PO TABS
ORAL_TABLET | ORAL | Status: AC
  Filled 2017-12-06: qty 1

## 2017-12-06 NOTE — Patient Instructions (Signed)
[facility] Discharge Instructions for Patients Receiving Chemotherapy  Today you received the following chemotherapy agents: Alimta.  To help prevent nausea and vomiting after your treatment, we encourage you to take your nausea medication as directed.   If you develop nausea and vomiting that is not controlled by your nausea medication, call the clinic.   BELOW ARE SYMPTOMS THAT SHOULD BE REPORTED IMMEDIATELY:  *FEVER GREATER THAN 100.5 F  *CHILLS WITH OR WITHOUT FEVER  NAUSEA AND VOMITING THAT IS NOT CONTROLLED WITH YOUR NAUSEA MEDICATION  *UNUSUAL SHORTNESS OF BREATH  *UNUSUAL BRUISING OR BLEEDING  TENDERNESS IN MOUTH AND THROAT WITH OR WITHOUT PRESENCE OF ULCERS  *URINARY PROBLEMS  *BOWEL PROBLEMS  UNUSUAL RASH Items with * indicate a potential emergency and should be followed up as soon as possible.  Feel free to call the clinic should you have any questions or concerns. The clinic phone number is [phone].  Please show the [facility] at check-in to the Emergency Department and triage nurse.

## 2017-12-06 NOTE — Telephone Encounter (Signed)
Scheduled appt per 5/13 los - gave pt avs and calender per los. - central radiology to contact pt with ct scan.

## 2017-12-08 ENCOUNTER — Other Ambulatory Visit (HOSPITAL_COMMUNITY): Payer: Medicare Other

## 2017-12-09 ENCOUNTER — Other Ambulatory Visit: Payer: Self-pay

## 2017-12-09 ENCOUNTER — Other Ambulatory Visit (HOSPITAL_COMMUNITY): Payer: Self-pay | Admitting: Internal Medicine

## 2017-12-09 ENCOUNTER — Ambulatory Visit (HOSPITAL_COMMUNITY): Payer: Medicare Other | Attending: Cardiovascular Disease

## 2017-12-09 DIAGNOSIS — R600 Localized edema: Secondary | ICD-10-CM

## 2017-12-09 DIAGNOSIS — Z853 Personal history of malignant neoplasm of breast: Secondary | ICD-10-CM | POA: Insufficient documentation

## 2017-12-09 DIAGNOSIS — R0600 Dyspnea, unspecified: Secondary | ICD-10-CM | POA: Diagnosis not present

## 2017-12-09 DIAGNOSIS — Z8249 Family history of ischemic heart disease and other diseases of the circulatory system: Secondary | ICD-10-CM | POA: Diagnosis not present

## 2017-12-09 DIAGNOSIS — Z9221 Personal history of antineoplastic chemotherapy: Secondary | ICD-10-CM | POA: Insufficient documentation

## 2017-12-09 DIAGNOSIS — Z6832 Body mass index (BMI) 32.0-32.9, adult: Secondary | ICD-10-CM | POA: Diagnosis not present

## 2017-12-09 DIAGNOSIS — Z87891 Personal history of nicotine dependence: Secondary | ICD-10-CM | POA: Diagnosis not present

## 2017-12-09 DIAGNOSIS — E669 Obesity, unspecified: Secondary | ICD-10-CM | POA: Diagnosis not present

## 2017-12-09 DIAGNOSIS — Z85118 Personal history of other malignant neoplasm of bronchus and lung: Secondary | ICD-10-CM | POA: Insufficient documentation

## 2017-12-09 DIAGNOSIS — G4733 Obstructive sleep apnea (adult) (pediatric): Secondary | ICD-10-CM | POA: Insufficient documentation

## 2017-12-09 DIAGNOSIS — Z923 Personal history of irradiation: Secondary | ICD-10-CM | POA: Insufficient documentation

## 2017-12-16 ENCOUNTER — Ambulatory Visit
Admission: RE | Admit: 2017-12-16 | Discharge: 2017-12-16 | Disposition: A | Discharge status: Home / Self Care | Payer: Medicare Other | Source: Ambulatory Visit | Attending: Internal Medicine | Admitting: Internal Medicine

## 2017-12-16 DIAGNOSIS — Z1231 Encounter for screening mammogram for malignant neoplasm of breast: Secondary | ICD-10-CM

## 2017-12-17 ENCOUNTER — Other Ambulatory Visit: Payer: Self-pay | Admitting: Internal Medicine

## 2017-12-17 DIAGNOSIS — R921 Mammographic calcification found on diagnostic imaging of breast: Secondary | ICD-10-CM

## 2017-12-18 ENCOUNTER — Other Ambulatory Visit: Payer: Self-pay | Admitting: Internal Medicine

## 2017-12-22 ENCOUNTER — Other Ambulatory Visit: Payer: Self-pay | Admitting: Internal Medicine

## 2017-12-22 ENCOUNTER — Ambulatory Visit
Admission: RE | Admit: 2017-12-22 | Discharge: 2017-12-22 | Disposition: A | Discharge status: Home / Self Care | Payer: Medicare Other | Source: Ambulatory Visit | Attending: Internal Medicine | Admitting: Internal Medicine

## 2017-12-22 DIAGNOSIS — R921 Mammographic calcification found on diagnostic imaging of breast: Secondary | ICD-10-CM

## 2017-12-23 ENCOUNTER — Encounter (HOSPITAL_COMMUNITY): Payer: Self-pay

## 2017-12-23 ENCOUNTER — Ambulatory Visit (HOSPITAL_COMMUNITY)
Admission: RE | Admit: 2017-12-23 | Discharge: 2017-12-23 | Disposition: A | Discharge status: Home / Self Care | Payer: Medicare Other | Source: Ambulatory Visit | Attending: Oncology | Admitting: Oncology

## 2017-12-23 DIAGNOSIS — J701 Chronic and other pulmonary manifestations due to radiation: Secondary | ICD-10-CM | POA: Diagnosis not present

## 2017-12-23 DIAGNOSIS — R918 Other nonspecific abnormal finding of lung field: Secondary | ICD-10-CM | POA: Diagnosis not present

## 2017-12-23 DIAGNOSIS — K449 Diaphragmatic hernia without obstruction or gangrene: Secondary | ICD-10-CM | POA: Diagnosis not present

## 2017-12-23 DIAGNOSIS — C3491 Malignant neoplasm of unspecified part of right bronchus or lung: Secondary | ICD-10-CM | POA: Diagnosis present

## 2017-12-23 DIAGNOSIS — K769 Liver disease, unspecified: Secondary | ICD-10-CM | POA: Insufficient documentation

## 2017-12-23 DIAGNOSIS — M4316 Spondylolisthesis, lumbar region: Secondary | ICD-10-CM | POA: Insufficient documentation

## 2017-12-23 DIAGNOSIS — K5730 Diverticulosis of large intestine without perforation or abscess without bleeding: Secondary | ICD-10-CM | POA: Insufficient documentation

## 2017-12-23 DIAGNOSIS — M5136 Other intervertebral disc degeneration, lumbar region: Secondary | ICD-10-CM | POA: Insufficient documentation

## 2017-12-23 DIAGNOSIS — J439 Emphysema, unspecified: Secondary | ICD-10-CM | POA: Diagnosis not present

## 2017-12-23 DIAGNOSIS — Y842 Radiological procedure and radiotherapy as the cause of abnormal reaction of the patient, or of later complication, without mention of misadventure at the time of the procedure: Secondary | ICD-10-CM | POA: Diagnosis not present

## 2017-12-23 DIAGNOSIS — I700 Atherosclerosis of aorta: Secondary | ICD-10-CM | POA: Insufficient documentation

## 2017-12-23 DIAGNOSIS — Q638 Other specified congenital malformations of kidney: Secondary | ICD-10-CM | POA: Diagnosis not present

## 2017-12-23 DIAGNOSIS — I2510 Atherosclerotic heart disease of native coronary artery without angina pectoris: Secondary | ICD-10-CM | POA: Insufficient documentation

## 2017-12-23 MED ORDER — HEPARIN SOD (PORK) LOCK FLUSH 100 UNIT/ML IV SOLN
500.0000 [IU] | Freq: Once | INTRAVENOUS | Status: AC
  Administered 2017-12-23: 500 [IU] via INTRAVENOUS

## 2017-12-23 MED ORDER — IOPAMIDOL (ISOVUE-300) INJECTION 61%
100.0000 mL | Freq: Once | INTRAVENOUS | Status: AC | PRN
  Administered 2017-12-23: 100 mL via INTRAVENOUS

## 2017-12-23 MED ORDER — IOPAMIDOL (ISOVUE-300) INJECTION 61%
INTRAVENOUS | Status: AC
  Filled 2017-12-23: qty 100

## 2017-12-23 MED ORDER — HEPARIN SOD (PORK) LOCK FLUSH 100 UNIT/ML IV SOLN
INTRAVENOUS | Status: AC
  Administered 2017-12-23: 500 [IU] via INTRAVENOUS
  Filled 2017-12-23: qty 5

## 2017-12-27 ENCOUNTER — Ambulatory Visit: Payer: Medicare Other

## 2017-12-27 ENCOUNTER — Ambulatory Visit: Payer: Medicare Other | Admitting: Internal Medicine

## 2017-12-27 ENCOUNTER — Inpatient Hospital Stay: Payer: Medicare Other

## 2017-12-27 ENCOUNTER — Other Ambulatory Visit: Payer: Medicare Other

## 2017-12-27 ENCOUNTER — Telehealth: Payer: Self-pay | Admitting: Internal Medicine

## 2017-12-27 ENCOUNTER — Inpatient Hospital Stay: Payer: Medicare Other | Attending: Urology

## 2017-12-27 ENCOUNTER — Encounter: Payer: Self-pay | Admitting: Internal Medicine

## 2017-12-27 ENCOUNTER — Inpatient Hospital Stay: Payer: Medicare Other | Admitting: Internal Medicine

## 2017-12-27 VITALS — BP 119/72 | HR 75 | Temp 97.8°F | Resp 18 | Ht 62.0 in | Wt 179.0 lb

## 2017-12-27 DIAGNOSIS — Z853 Personal history of malignant neoplasm of breast: Secondary | ICD-10-CM | POA: Insufficient documentation

## 2017-12-27 DIAGNOSIS — C801 Malignant (primary) neoplasm, unspecified: Secondary | ICD-10-CM

## 2017-12-27 DIAGNOSIS — Z923 Personal history of irradiation: Secondary | ICD-10-CM | POA: Diagnosis not present

## 2017-12-27 DIAGNOSIS — Z79899 Other long term (current) drug therapy: Secondary | ICD-10-CM | POA: Diagnosis not present

## 2017-12-27 DIAGNOSIS — Z95828 Presence of other vascular implants and grafts: Secondary | ICD-10-CM

## 2017-12-27 DIAGNOSIS — C3491 Malignant neoplasm of unspecified part of right bronchus or lung: Secondary | ICD-10-CM

## 2017-12-27 DIAGNOSIS — C7971 Secondary malignant neoplasm of right adrenal gland: Secondary | ICD-10-CM | POA: Diagnosis not present

## 2017-12-27 DIAGNOSIS — Z9011 Acquired absence of right breast and nipple: Secondary | ICD-10-CM | POA: Insufficient documentation

## 2017-12-27 DIAGNOSIS — Z171 Estrogen receptor negative status [ER-]: Secondary | ICD-10-CM | POA: Insufficient documentation

## 2017-12-27 DIAGNOSIS — C7931 Secondary malignant neoplasm of brain: Secondary | ICD-10-CM | POA: Insufficient documentation

## 2017-12-27 DIAGNOSIS — Z5111 Encounter for antineoplastic chemotherapy: Secondary | ICD-10-CM | POA: Diagnosis present

## 2017-12-27 DIAGNOSIS — C7972 Secondary malignant neoplasm of left adrenal gland: Secondary | ICD-10-CM

## 2017-12-27 LAB — CMP (CANCER CENTER ONLY)
ALBUMIN: 3.6 g/dL (ref 3.5–5.0)
ALT: 18 U/L (ref 0–55)
ANION GAP: 8 (ref 3–11)
AST: 24 U/L (ref 5–34)
Alkaline Phosphatase: 45 U/L (ref 40–150)
BILIRUBIN TOTAL: 0.7 mg/dL (ref 0.2–1.2)
BUN: 21 mg/dL (ref 7–26)
CALCIUM: 9.7 mg/dL (ref 8.4–10.4)
CO2: 25 mmol/L (ref 22–29)
CREATININE: 0.68 mg/dL (ref 0.60–1.10)
Chloride: 104 mmol/L (ref 98–109)
GFR, Est AFR Am: >60 mL/min (ref >60)
GFR, Estimated: >60 mL/min (ref >60)
Glucose, Bld: 78 mg/dL (ref 70–140)
Potassium: 4.2 mmol/L (ref 3.5–5.1)
SODIUM: 137 mmol/L (ref 136–145)
Total Protein: 6.2 g/dL — ABNORMAL LOW (ref 6.4–8.3)

## 2017-12-27 LAB — CBC WITH DIFFERENTIAL (CANCER CENTER ONLY)
BASOS PCT: 0 %
Basophils Absolute: 0 10*3/uL (ref 0.0–0.1)
EOS ABS: 0 10*3/uL (ref 0.0–0.5)
Eosinophils Relative: 0 %
HCT: 37.8 % (ref 34.8–46.6)
Hemoglobin: 12.8 g/dL (ref 11.6–15.9)
Lymphocytes Relative: 5 %
Lymphs Abs: 0.3 10*3/uL — ABNORMAL LOW (ref 0.9–3.3)
MCH: 32.2 pg (ref 25.1–34.0)
MCHC: 33.9 g/dL (ref 31.5–36.0)
MCV: 95 fL (ref 79.5–101.0)
MONO ABS: 0.4 10*3/uL (ref 0.1–0.9)
MONOS PCT: 7 %
Neutro Abs: 4.9 10*3/uL (ref 1.5–6.5)
Neutrophils Relative %: 88 %
Platelet Count: 167 10*3/uL (ref 145–400)
RBC: 3.98 MIL/uL (ref 3.70–5.45)
RDW: 16.5 % — AB (ref 11.2–14.5)
WBC Count: 5.6 10*3/uL (ref 3.9–10.3)

## 2017-12-27 MED ORDER — HEPARIN SOD (PORK) LOCK FLUSH 100 UNIT/ML IV SOLN
500.0000 [IU] | Freq: Once | INTRAVENOUS | Status: AC | PRN
  Administered 2017-12-27: 500 [IU]
  Filled 2017-12-27: qty 5

## 2017-12-27 MED ORDER — PROCHLORPERAZINE MALEATE 10 MG PO TABS
10.0000 mg | ORAL_TABLET | Freq: Once | ORAL | Status: AC
  Administered 2017-12-27: 10 mg via ORAL

## 2017-12-27 MED ORDER — SODIUM CHLORIDE 0.9 % IV SOLN
Freq: Once | INTRAVENOUS | Status: AC
  Administered 2017-12-27: 12:00:00 via INTRAVENOUS

## 2017-12-27 MED ORDER — SODIUM CHLORIDE 0.9 % IV SOLN
500.0000 mg/m2 | Freq: Once | INTRAVENOUS | Status: AC
  Administered 2017-12-27: 900 mg via INTRAVENOUS
  Filled 2017-12-27: qty 36

## 2017-12-27 MED ORDER — SODIUM CHLORIDE 0.9% FLUSH
10.0000 mL | INTRAVENOUS | Status: DC | PRN
  Administered 2017-12-27: 10 mL via INTRAVENOUS
  Filled 2017-12-27: qty 10

## 2017-12-27 MED ORDER — SODIUM CHLORIDE 0.9% FLUSH
10.0000 mL | INTRAVENOUS | Status: DC | PRN
  Administered 2017-12-27: 10 mL
  Filled 2017-12-27: qty 10

## 2017-12-27 MED ORDER — CYANOCOBALAMIN 1000 MCG/ML IJ SOLN
1000.0000 ug | Freq: Once | INTRAMUSCULAR | Status: AC
  Administered 2017-12-27: 1000 ug via INTRAMUSCULAR

## 2017-12-27 NOTE — Telephone Encounter (Signed)
Appointments scheduled AVS/Calendar printed per 6/3 los
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
## 2017-12-27 NOTE — Patient Instructions (Signed)
[facility] Discharge Instructions for Patients Receiving Chemotherapy  Today you received the following chemotherapy agents Alimta  To help prevent nausea and vomiting after your treatment, we encourage you to take your nausea medication as directed   If you develop nausea and vomiting that is not controlled by your nausea medication, call the clinic.   BELOW ARE SYMPTOMS THAT SHOULD BE REPORTED IMMEDIATELY:  *FEVER GREATER THAN 100.5 F  *CHILLS WITH OR WITHOUT FEVER  NAUSEA AND VOMITING THAT IS NOT CONTROLLED WITH YOUR NAUSEA MEDICATION  *UNUSUAL SHORTNESS OF BREATH  *UNUSUAL BRUISING OR BLEEDING  TENDERNESS IN MOUTH AND THROAT WITH OR WITHOUT PRESENCE OF ULCERS  *URINARY PROBLEMS  *BOWEL PROBLEMS  UNUSUAL RASH Items with * indicate a potential emergency and should be followed up as soon as possible.  Feel free to call the clinic should you have any questions or concerns. The clinic phone number is [phone].  Please show the [facility] at check-in to the Emergency Department and triage nurse.

## 2018-01-17 ENCOUNTER — Inpatient Hospital Stay: Payer: Medicare Other

## 2018-01-17 ENCOUNTER — Inpatient Hospital Stay: Payer: Medicare Other | Admitting: Internal Medicine

## 2018-01-17 ENCOUNTER — Encounter: Payer: Self-pay | Admitting: Internal Medicine

## 2018-01-17 ENCOUNTER — Telehealth: Payer: Self-pay

## 2018-01-17 VITALS — BP 122/77 | HR 84 | Temp 97.8°F | Resp 18 | Ht 62.0 in | Wt 178.6 lb

## 2018-01-17 DIAGNOSIS — Z923 Personal history of irradiation: Secondary | ICD-10-CM | POA: Diagnosis not present

## 2018-01-17 DIAGNOSIS — C3491 Malignant neoplasm of unspecified part of right bronchus or lung: Secondary | ICD-10-CM

## 2018-01-17 DIAGNOSIS — Z5111 Encounter for antineoplastic chemotherapy: Secondary | ICD-10-CM | POA: Diagnosis not present

## 2018-01-17 DIAGNOSIS — Z853 Personal history of malignant neoplasm of breast: Secondary | ICD-10-CM

## 2018-01-17 DIAGNOSIS — Z171 Estrogen receptor negative status [ER-]: Secondary | ICD-10-CM

## 2018-01-17 DIAGNOSIS — Z95828 Presence of other vascular implants and grafts: Secondary | ICD-10-CM

## 2018-01-17 DIAGNOSIS — C7971 Secondary malignant neoplasm of right adrenal gland: Secondary | ICD-10-CM | POA: Diagnosis not present

## 2018-01-17 DIAGNOSIS — C7972 Secondary malignant neoplasm of left adrenal gland: Secondary | ICD-10-CM | POA: Diagnosis not present

## 2018-01-17 DIAGNOSIS — C7931 Secondary malignant neoplasm of brain: Secondary | ICD-10-CM | POA: Diagnosis not present

## 2018-01-17 DIAGNOSIS — Z79899 Other long term (current) drug therapy: Secondary | ICD-10-CM

## 2018-01-17 DIAGNOSIS — Z9011 Acquired absence of right breast and nipple: Secondary | ICD-10-CM

## 2018-01-17 DIAGNOSIS — C801 Malignant (primary) neoplasm, unspecified: Secondary | ICD-10-CM

## 2018-01-17 LAB — CMP (CANCER CENTER ONLY)
ALK PHOS: 42 U/L (ref 40–150)
ALT: 17 U/L (ref 0–55)
AST: 22 U/L (ref 5–34)
Albumin: 3.5 g/dL (ref 3.5–5.0)
Anion gap: 8 (ref 3–11)
BUN: 20 mg/dL (ref 7–26)
CHLORIDE: 102 mmol/L (ref 98–109)
CO2: 26 mmol/L (ref 22–29)
CREATININE: 0.78 mg/dL (ref 0.60–1.10)
Calcium: 9.9 mg/dL (ref 8.4–10.4)
GFR, Estimated: >60 mL/min (ref >60)
Glucose, Bld: 110 mg/dL (ref 70–140)
Potassium: 4.1 mmol/L (ref 3.5–5.1)
SODIUM: 136 mmol/L (ref 136–145)
Total Bilirubin: 0.8 mg/dL (ref 0.2–1.2)
Total Protein: 6.2 g/dL — ABNORMAL LOW (ref 6.4–8.3)

## 2018-01-17 LAB — CBC WITH DIFFERENTIAL (CANCER CENTER ONLY)
Basophils Absolute: 0 10*3/uL (ref 0.0–0.1)
Basophils Relative: 0 %
EOS ABS: 0 10*3/uL (ref 0.0–0.5)
Eosinophils Relative: 0 %
HEMATOCRIT: 37 % (ref 34.8–46.6)
HEMOGLOBIN: 12.8 g/dL (ref 11.6–15.9)
LYMPHS ABS: 0.3 10*3/uL — AB (ref 0.9–3.3)
Lymphocytes Relative: 6 %
MCH: 32.6 pg (ref 25.1–34.0)
MCHC: 34.6 g/dL (ref 31.5–36.0)
MCV: 94.1 fL (ref 79.5–101.0)
Monocytes Absolute: 0.2 10*3/uL (ref 0.1–0.9)
Monocytes Relative: 5 %
NEUTROS ABS: 4.3 10*3/uL (ref 1.5–6.5)
NEUTROS PCT: 89 %
Platelet Count: 155 10*3/uL (ref 145–400)
RBC: 3.93 MIL/uL (ref 3.70–5.45)
RDW: 16.2 % — ABNORMAL HIGH (ref 11.2–14.5)
WBC: 4.8 10*3/uL (ref 3.9–10.3)

## 2018-01-17 MED ORDER — SODIUM CHLORIDE 0.9 % IV SOLN
500.0000 mg/m2 | Freq: Once | INTRAVENOUS | Status: AC
  Administered 2018-01-17: 900 mg via INTRAVENOUS
  Filled 2018-01-17: qty 16

## 2018-01-17 MED ORDER — SODIUM CHLORIDE 0.9% FLUSH
10.0000 mL | INTRAVENOUS | Status: DC | PRN
  Administered 2018-01-17: 10 mL via INTRAVENOUS
  Filled 2018-01-17: qty 10

## 2018-01-17 MED ORDER — SODIUM CHLORIDE 0.9 % IV SOLN
Freq: Once | INTRAVENOUS | Status: AC
  Administered 2018-01-17: 12:00:00 via INTRAVENOUS

## 2018-01-17 MED ORDER — HEPARIN SOD (PORK) LOCK FLUSH 100 UNIT/ML IV SOLN
500.0000 [IU] | Freq: Once | INTRAVENOUS | Status: AC | PRN
  Administered 2018-01-17: 500 [IU]
  Filled 2018-01-17: qty 5

## 2018-01-17 MED ORDER — SODIUM CHLORIDE 0.9% FLUSH
10.0000 mL | INTRAVENOUS | Status: DC | PRN
  Administered 2018-01-17: 10 mL
  Filled 2018-01-17: qty 10

## 2018-01-17 MED ORDER — PROCHLORPERAZINE MALEATE 10 MG PO TABS
10.0000 mg | ORAL_TABLET | Freq: Once | ORAL | Status: AC
  Administered 2018-01-17: 10 mg via ORAL

## 2018-01-17 MED ORDER — PROCHLORPERAZINE MALEATE 10 MG PO TABS
ORAL_TABLET | ORAL | Status: AC
  Filled 2018-01-17: qty 1

## 2018-01-17 NOTE — Patient Instructions (Signed)
[facility] Discharge Instructions for Patients Receiving Chemotherapy  Today you received the following chemotherapy agents Alimta  To help prevent nausea and vomiting after your treatment, we encourage you to take your nausea medication as directed.    If you develop nausea and vomiting that is not controlled by your nausea medication, call the clinic.   BELOW ARE SYMPTOMS THAT SHOULD BE REPORTED IMMEDIATELY:  *FEVER GREATER THAN 100.5 F  *CHILLS WITH OR WITHOUT FEVER  NAUSEA AND VOMITING THAT IS NOT CONTROLLED WITH YOUR NAUSEA MEDICATION  *UNUSUAL SHORTNESS OF BREATH  *UNUSUAL BRUISING OR BLEEDING  TENDERNESS IN MOUTH AND THROAT WITH OR WITHOUT PRESENCE OF ULCERS  *URINARY PROBLEMS  *BOWEL PROBLEMS  UNUSUAL RASH Items with * indicate a potential emergency and should be followed up as soon as possible.  Feel free to call the clinic should you have any questions or concerns. The clinic phone number is [phone].  Please show the [facility] at check-in to the Emergency Department and triage nurse.

## 2018-01-17 NOTE — Telephone Encounter (Signed)
Already scheduled per 6/24 los x3 out
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
## 2018-01-19 ENCOUNTER — Telehealth: Payer: Self-pay | Admitting: *Deleted

## 2018-01-19 ENCOUNTER — Other Ambulatory Visit: Payer: Self-pay | Admitting: Internal Medicine

## 2018-01-19 NOTE — Telephone Encounter (Signed)
I was seen Monday.  Folic acid refill was to be sent.  My drug store has not recieved refill and has faxed request.  Not sure how important it is but I took my last pill yesterday"  eRx request noted.

## 2018-02-07 ENCOUNTER — Inpatient Hospital Stay: Payer: Medicare Other

## 2018-02-07 ENCOUNTER — Inpatient Hospital Stay (HOSPITAL_BASED_OUTPATIENT_CLINIC_OR_DEPARTMENT_OTHER): Payer: Medicare Other | Admitting: Internal Medicine

## 2018-02-07 ENCOUNTER — Inpatient Hospital Stay: Payer: Medicare Other | Attending: Urology

## 2018-02-07 ENCOUNTER — Encounter: Payer: Self-pay | Admitting: Internal Medicine

## 2018-02-07 ENCOUNTER — Telehealth: Payer: Self-pay

## 2018-02-07 VITALS — BP 101/73 | HR 71 | Temp 97.8°F | Resp 18 | Ht 62.0 in | Wt 181.7 lb

## 2018-02-07 DIAGNOSIS — T451X5A Adverse effect of antineoplastic and immunosuppressive drugs, initial encounter: Secondary | ICD-10-CM

## 2018-02-07 DIAGNOSIS — Z923 Personal history of irradiation: Secondary | ICD-10-CM

## 2018-02-07 DIAGNOSIS — C7971 Secondary malignant neoplasm of right adrenal gland: Secondary | ICD-10-CM | POA: Insufficient documentation

## 2018-02-07 DIAGNOSIS — Z171 Estrogen receptor negative status [ER-]: Secondary | ICD-10-CM

## 2018-02-07 DIAGNOSIS — D6481 Anemia due to antineoplastic chemotherapy: Secondary | ICD-10-CM

## 2018-02-07 DIAGNOSIS — C7972 Secondary malignant neoplasm of left adrenal gland: Secondary | ICD-10-CM | POA: Diagnosis not present

## 2018-02-07 DIAGNOSIS — C3491 Malignant neoplasm of unspecified part of right bronchus or lung: Secondary | ICD-10-CM | POA: Diagnosis not present

## 2018-02-07 DIAGNOSIS — Z853 Personal history of malignant neoplasm of breast: Secondary | ICD-10-CM

## 2018-02-07 DIAGNOSIS — Z5111 Encounter for antineoplastic chemotherapy: Secondary | ICD-10-CM | POA: Insufficient documentation

## 2018-02-07 DIAGNOSIS — C801 Malignant (primary) neoplasm, unspecified: Secondary | ICD-10-CM

## 2018-02-07 DIAGNOSIS — Z79899 Other long term (current) drug therapy: Secondary | ICD-10-CM

## 2018-02-07 DIAGNOSIS — Z9011 Acquired absence of right breast and nipple: Secondary | ICD-10-CM | POA: Insufficient documentation

## 2018-02-07 DIAGNOSIS — C7931 Secondary malignant neoplasm of brain: Secondary | ICD-10-CM | POA: Insufficient documentation

## 2018-02-07 DIAGNOSIS — Z95828 Presence of other vascular implants and grafts: Secondary | ICD-10-CM

## 2018-02-07 DIAGNOSIS — C3490 Malignant neoplasm of unspecified part of unspecified bronchus or lung: Secondary | ICD-10-CM

## 2018-02-07 LAB — CBC WITH DIFFERENTIAL (CANCER CENTER ONLY)
BASOS ABS: 0 10*3/uL (ref 0.0–0.1)
BASOS PCT: 0 %
Eosinophils Absolute: 0 10*3/uL (ref 0.0–0.5)
Eosinophils Relative: 0 %
HEMATOCRIT: 35.9 % (ref 34.8–46.6)
HEMOGLOBIN: 12.4 g/dL (ref 11.6–15.9)
LYMPHS PCT: 4 %
Lymphs Abs: 0.2 10*3/uL — ABNORMAL LOW (ref 0.9–3.3)
MCH: 32.6 pg (ref 25.1–34.0)
MCHC: 34.5 g/dL (ref 31.5–36.0)
MCV: 94.3 fL (ref 79.5–101.0)
MONO ABS: 0.3 10*3/uL (ref 0.1–0.9)
Monocytes Relative: 5 %
NEUTROS ABS: 5.7 10*3/uL (ref 1.5–6.5)
NEUTROS PCT: 91 %
Platelet Count: 157 10*3/uL (ref 145–400)
RBC: 3.8 MIL/uL (ref 3.70–5.45)
RDW: 16.6 % — AB (ref 11.2–14.5)
WBC Count: 6.2 10*3/uL (ref 3.9–10.3)

## 2018-02-07 LAB — CMP (CANCER CENTER ONLY)
ALBUMIN: 3.4 g/dL — AB (ref 3.5–5.0)
ALK PHOS: 40 U/L (ref 38–126)
ALT: 20 U/L (ref 0–44)
AST: 23 U/L (ref 15–41)
Anion gap: 7 (ref 5–15)
BILIRUBIN TOTAL: 0.8 mg/dL (ref 0.3–1.2)
BUN: 21 mg/dL (ref 8–23)
CALCIUM: 10.1 mg/dL (ref 8.9–10.3)
CO2: 27 mmol/L (ref 22–32)
CREATININE: 0.69 mg/dL (ref 0.44–1.00)
Chloride: 104 mmol/L (ref 98–111)
GFR, Est AFR Am: >60 mL/min (ref >60)
Glucose, Bld: 101 mg/dL — ABNORMAL HIGH (ref 70–99)
Potassium: 4.2 mmol/L (ref 3.5–5.1)
Sodium: 138 mmol/L (ref 135–145)
TOTAL PROTEIN: 6 g/dL — AB (ref 6.5–8.1)

## 2018-02-07 MED ORDER — HEPARIN SOD (PORK) LOCK FLUSH 100 UNIT/ML IV SOLN
500.0000 [IU] | Freq: Once | INTRAVENOUS | Status: AC | PRN
  Administered 2018-02-07: 500 [IU]
  Filled 2018-02-07: qty 5

## 2018-02-07 MED ORDER — SODIUM CHLORIDE 0.9 % IV SOLN
500.0000 mg/m2 | Freq: Once | INTRAVENOUS | Status: AC
  Administered 2018-02-07: 900 mg via INTRAVENOUS
  Filled 2018-02-07: qty 20

## 2018-02-07 MED ORDER — SODIUM CHLORIDE 0.9% FLUSH
10.0000 mL | INTRAVENOUS | Status: DC | PRN
  Administered 2018-02-07: 10 mL
  Filled 2018-02-07: qty 10

## 2018-02-07 MED ORDER — PROCHLORPERAZINE MALEATE 10 MG PO TABS
ORAL_TABLET | ORAL | Status: AC
  Filled 2018-02-07: qty 1

## 2018-02-07 MED ORDER — SODIUM CHLORIDE 0.9 % IV SOLN
Freq: Once | INTRAVENOUS | Status: AC
  Administered 2018-02-07: 12:00:00 via INTRAVENOUS

## 2018-02-07 MED ORDER — SODIUM CHLORIDE 0.9% FLUSH
10.0000 mL | INTRAVENOUS | Status: DC | PRN
  Administered 2018-02-07: 10 mL via INTRAVENOUS
  Filled 2018-02-07: qty 10

## 2018-02-07 MED ORDER — PROCHLORPERAZINE MALEATE 10 MG PO TABS
10.0000 mg | ORAL_TABLET | Freq: Once | ORAL | Status: AC
  Administered 2018-02-07: 10 mg via ORAL

## 2018-02-07 NOTE — Telephone Encounter (Signed)
Printed avs and calender of upcoming appointment. Per 7/15 los. Gave patient contrast and instructions
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
## 2018-02-17 ENCOUNTER — Other Ambulatory Visit: Payer: Self-pay | Admitting: Internal Medicine

## 2018-02-18 ENCOUNTER — Telehealth: Payer: Self-pay

## 2018-02-18 NOTE — Telephone Encounter (Deleted)
Patient calls requesting a refill on her folic acid.

## 2018-02-18 NOTE — Telephone Encounter (Signed)
Rx has been sent  

## 2018-02-18 NOTE — Telephone Encounter (Signed)
Patient needs refill on folic acid.

## 2018-02-21 ENCOUNTER — Ambulatory Visit (HOSPITAL_COMMUNITY): Payer: Medicare Other

## 2018-02-22 NOTE — Progress Notes (Signed)
Molly Black. [place]75 y.o.woman with 4 right frontal brain metastases from non-small cell cancer of the left lower lung completed 07-31-16 brain,review 02-24-18 MRI brain w contrast,FU.   Headache:No Dizziness:Yes Nausea/vomiting:Yes for a certain amount of time if she sits it's better. Ringing in ears:No Visual changes:No problem with eyes. Fatigue:Yes Cognitive changes:Alert and oriented x three. Very talkative today. No problems with short and long term memory. Taking Decadron 4 mg the day before the day of and the day after chemotherapy no sign of thrush. Swelling of right lower leg "stated she hit her leg and that's when the swelling started"  will see her PCP on Friday. Mentioned port-a-cath site has redness to her will have it checked today by Ulyess Blossom, PA in Radiology. Weight: Wt Readings from Last 3 Encounters:  02/28/18 178 lb 3.2 oz (80.8 kg)  02/07/18 181 lb 11.2 oz (82.4 kg)  01/17/18 178 lb 9.6 oz (81 kg)  BP 124/73 (BP Location: Left Arm, Patient Position: Sitting, Cuff Size: Normal)   Pulse 73   Temp 98 F (36.7 C) (Oral)   Resp 18   Ht 5\' 2"  (1.575 m)   Wt 178 lb 3.2 oz (80.8 kg)   SpO2 100%   BMI 32.59 kg/m  BP 117/75 (BP Location: Left Arm, Patient Position: Standing, Cuff Size: Normal)   Pulse 79   Temp 98 F (36.7 C) (Oral)   Resp 18   Ht 5\' 2"  (1.575 m)   Wt 178 lb 3.2 oz (80.8 kg)   SpO2 100%   BMI 32.59 kg/m

## 2018-02-24 ENCOUNTER — Ambulatory Visit: Payer: Self-pay | Admitting: Urology

## 2018-02-24 ENCOUNTER — Ambulatory Visit (HOSPITAL_COMMUNITY)
Admission: RE | Admit: 2018-02-24 | Discharge: 2018-02-24 | Disposition: A | Discharge status: Home / Self Care | Payer: Medicare Other | Source: Ambulatory Visit | Attending: Radiation Oncology | Admitting: Radiation Oncology

## 2018-02-24 DIAGNOSIS — H748X3 Other specified disorders of middle ear and mastoid, bilateral: Secondary | ICD-10-CM | POA: Diagnosis not present

## 2018-02-24 DIAGNOSIS — R9082 White matter disease, unspecified: Secondary | ICD-10-CM | POA: Insufficient documentation

## 2018-02-24 DIAGNOSIS — G9389 Other specified disorders of brain: Secondary | ICD-10-CM | POA: Insufficient documentation

## 2018-02-24 DIAGNOSIS — G319 Degenerative disease of nervous system, unspecified: Secondary | ICD-10-CM | POA: Diagnosis not present

## 2018-02-24 DIAGNOSIS — C7931 Secondary malignant neoplasm of brain: Secondary | ICD-10-CM | POA: Insufficient documentation

## 2018-02-24 DIAGNOSIS — C7949 Secondary malignant neoplasm of other parts of nervous system: Secondary | ICD-10-CM | POA: Diagnosis not present

## 2018-02-24 MED ORDER — HEPARIN SOD (PORK) LOCK FLUSH 100 UNIT/ML IV SOLN
500.0000 [IU] | INTRAVENOUS | Status: AC | PRN
  Administered 2018-02-24: 500 [IU]

## 2018-02-24 MED ORDER — GADOBENATE DIMEGLUMINE 529 MG/ML IV SOLN
18.0000 mL | Freq: Once | INTRAVENOUS | Status: AC | PRN
  Administered 2018-02-24: 18 mL via INTRAVENOUS

## 2018-02-25 ENCOUNTER — Encounter (HOSPITAL_COMMUNITY): Payer: Self-pay

## 2018-02-25 ENCOUNTER — Telehealth: Payer: Self-pay | Admitting: Medical Oncology

## 2018-02-25 ENCOUNTER — Ambulatory Visit (HOSPITAL_COMMUNITY)
Admission: RE | Admit: 2018-02-25 | Discharge: 2018-02-25 | Disposition: A | Discharge status: Home / Self Care | Payer: Medicare Other | Source: Ambulatory Visit | Attending: Internal Medicine | Admitting: Internal Medicine

## 2018-02-25 DIAGNOSIS — C3491 Malignant neoplasm of unspecified part of right bronchus or lung: Secondary | ICD-10-CM | POA: Insufficient documentation

## 2018-02-25 DIAGNOSIS — I700 Atherosclerosis of aorta: Secondary | ICD-10-CM | POA: Diagnosis not present

## 2018-02-25 DIAGNOSIS — C3490 Malignant neoplasm of unspecified part of unspecified bronchus or lung: Secondary | ICD-10-CM | POA: Diagnosis present

## 2018-02-25 DIAGNOSIS — I2510 Atherosclerotic heart disease of native coronary artery without angina pectoris: Secondary | ICD-10-CM | POA: Insufficient documentation

## 2018-02-25 DIAGNOSIS — K5730 Diverticulosis of large intestine without perforation or abscess without bleeding: Secondary | ICD-10-CM | POA: Diagnosis not present

## 2018-02-25 MED ORDER — HEPARIN SOD (PORK) LOCK FLUSH 100 UNIT/ML IV SOLN
INTRAVENOUS | Status: AC
  Filled 2018-02-25: qty 5

## 2018-02-25 MED ORDER — IOPAMIDOL (ISOVUE-300) INJECTION 61%
INTRAVENOUS | Status: AC
  Filled 2018-02-25: qty 100

## 2018-02-25 MED ORDER — IOPAMIDOL (ISOVUE-300) INJECTION 61%
100.0000 mL | Freq: Once | INTRAVENOUS | Status: AC | PRN
  Administered 2018-02-25: 100 mL via INTRAVENOUS

## 2018-02-25 NOTE — Telephone Encounter (Signed)
Pt had CT scan today . Port site noted to be red and tender ,no drainage, afebrile. Pt was instructed to contact provider if symptoms worsen and to f/u 02/28/18.
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
## 2018-02-28 ENCOUNTER — Inpatient Hospital Stay: Payer: Medicare Other

## 2018-02-28 ENCOUNTER — Other Ambulatory Visit: Payer: Self-pay

## 2018-02-28 ENCOUNTER — Inpatient Hospital Stay: Payer: Medicare Other | Attending: Urology

## 2018-02-28 ENCOUNTER — Encounter: Payer: Self-pay | Admitting: Internal Medicine

## 2018-02-28 ENCOUNTER — Telehealth: Payer: Self-pay | Admitting: Internal Medicine

## 2018-02-28 ENCOUNTER — Ambulatory Visit
Admission: RE | Admit: 2018-02-28 | Discharge: 2018-02-28 | Disposition: A | Discharge status: Home / Self Care | Payer: Medicare Other | Source: Ambulatory Visit | Attending: Radiation Oncology | Admitting: Radiation Oncology

## 2018-02-28 ENCOUNTER — Inpatient Hospital Stay (HOSPITAL_BASED_OUTPATIENT_CLINIC_OR_DEPARTMENT_OTHER): Payer: Medicare Other | Admitting: Internal Medicine

## 2018-02-28 ENCOUNTER — Encounter: Payer: Self-pay | Admitting: Radiation Oncology

## 2018-02-28 VITALS — BP 115/72 | HR 74 | Temp 97.7°F | Resp 17 | Ht 62.0 in | Wt 182.5 lb

## 2018-02-28 VITALS — BP 117/75 | HR 79 | Temp 98.0°F | Resp 18 | Ht 62.0 in | Wt 178.2 lb

## 2018-02-28 DIAGNOSIS — Z87891 Personal history of nicotine dependence: Secondary | ICD-10-CM | POA: Diagnosis not present

## 2018-02-28 DIAGNOSIS — C7931 Secondary malignant neoplasm of brain: Secondary | ICD-10-CM | POA: Insufficient documentation

## 2018-02-28 DIAGNOSIS — Z79899 Other long term (current) drug therapy: Secondary | ICD-10-CM | POA: Diagnosis not present

## 2018-02-28 DIAGNOSIS — Z5111 Encounter for antineoplastic chemotherapy: Secondary | ICD-10-CM | POA: Diagnosis not present

## 2018-02-28 DIAGNOSIS — Z08 Encounter for follow-up examination after completed treatment for malignant neoplasm: Secondary | ICD-10-CM | POA: Diagnosis present

## 2018-02-28 DIAGNOSIS — C3491 Malignant neoplasm of unspecified part of right bronchus or lung: Secondary | ICD-10-CM

## 2018-02-28 DIAGNOSIS — Z9011 Acquired absence of right breast and nipple: Secondary | ICD-10-CM | POA: Diagnosis not present

## 2018-02-28 DIAGNOSIS — C7972 Secondary malignant neoplasm of left adrenal gland: Secondary | ICD-10-CM

## 2018-02-28 DIAGNOSIS — Z95828 Presence of other vascular implants and grafts: Secondary | ICD-10-CM

## 2018-02-28 DIAGNOSIS — C3431 Malignant neoplasm of lower lobe, right bronchus or lung: Secondary | ICD-10-CM

## 2018-02-28 DIAGNOSIS — Z9221 Personal history of antineoplastic chemotherapy: Secondary | ICD-10-CM | POA: Diagnosis not present

## 2018-02-28 DIAGNOSIS — Z85828 Personal history of other malignant neoplasm of skin: Secondary | ICD-10-CM | POA: Diagnosis not present

## 2018-02-28 DIAGNOSIS — R42 Dizziness and giddiness: Secondary | ICD-10-CM | POA: Insufficient documentation

## 2018-02-28 DIAGNOSIS — R600 Localized edema: Secondary | ICD-10-CM | POA: Insufficient documentation

## 2018-02-28 DIAGNOSIS — C801 Malignant (primary) neoplasm, unspecified: Secondary | ICD-10-CM

## 2018-02-28 DIAGNOSIS — Z923 Personal history of irradiation: Secondary | ICD-10-CM

## 2018-02-28 DIAGNOSIS — C7971 Secondary malignant neoplasm of right adrenal gland: Secondary | ICD-10-CM | POA: Insufficient documentation

## 2018-02-28 LAB — CMP (CANCER CENTER ONLY)
ALT: 18 U/L (ref 0–44)
AST: 24 U/L (ref 15–41)
Albumin: 3.3 g/dL — ABNORMAL LOW (ref 3.5–5.0)
Alkaline Phosphatase: 40 U/L (ref 38–126)
Anion gap: 9 (ref 5–15)
BUN: 13 mg/dL (ref 8–23)
CHLORIDE: 104 mmol/L (ref 98–111)
CO2: 25 mmol/L (ref 22–32)
CREATININE: 0.69 mg/dL (ref 0.44–1.00)
Calcium: 9.6 mg/dL (ref 8.9–10.3)
GFR, Est AFR Am: >60 mL/min (ref >60)
Glucose, Bld: 95 mg/dL (ref 70–99)
Potassium: 4.3 mmol/L (ref 3.5–5.1)
Sodium: 138 mmol/L (ref 135–145)
Total Bilirubin: 0.7 mg/dL (ref 0.3–1.2)
Total Protein: 6 g/dL — ABNORMAL LOW (ref 6.5–8.1)

## 2018-02-28 LAB — CBC WITH DIFFERENTIAL (CANCER CENTER ONLY)
Basophils Absolute: 0 10*3/uL (ref 0.0–0.1)
Basophils Relative: 0 %
EOS PCT: 0 %
Eosinophils Absolute: 0 10*3/uL (ref 0.0–0.5)
HCT: 35.2 % (ref 34.8–46.6)
Hemoglobin: 12.2 g/dL (ref 11.6–15.9)
LYMPHS ABS: 0.2 10*3/uL — AB (ref 0.9–3.3)
Lymphocytes Relative: 4 %
MCH: 32.9 pg (ref 25.1–34.0)
MCHC: 34.8 g/dL (ref 31.5–36.0)
MCV: 94.5 fL (ref 79.5–101.0)
MONOS PCT: 5 %
Monocytes Absolute: 0.3 10*3/uL (ref 0.1–0.9)
Neutro Abs: 5.5 10*3/uL (ref 1.5–6.5)
Neutrophils Relative %: 91 %
PLATELETS: 170 10*3/uL (ref 145–400)
RBC: 3.72 MIL/uL (ref 3.70–5.45)
RDW: 16.8 % — AB (ref 11.2–14.5)
WBC Count: 6 10*3/uL (ref 3.9–10.3)

## 2018-02-28 MED ORDER — CYANOCOBALAMIN 1000 MCG/ML IJ SOLN
1000.0000 ug | Freq: Once | INTRAMUSCULAR | Status: AC
  Administered 2018-02-28: 1000 ug via INTRAMUSCULAR

## 2018-02-28 MED ORDER — CYANOCOBALAMIN 1000 MCG/ML IJ SOLN
INTRAMUSCULAR | Status: AC
  Filled 2018-02-28: qty 1

## 2018-02-28 MED ORDER — HEPARIN SOD (PORK) LOCK FLUSH 100 UNIT/ML IV SOLN
500.0000 [IU] | Freq: Once | INTRAVENOUS | Status: DC | PRN
  Filled 2018-02-28: qty 5

## 2018-02-28 MED ORDER — SODIUM CHLORIDE 0.9 % IV SOLN
500.0000 mg/m2 | Freq: Once | INTRAVENOUS | Status: AC
  Administered 2018-02-28: 900 mg via INTRAVENOUS
  Filled 2018-02-28: qty 20

## 2018-02-28 MED ORDER — PROCHLORPERAZINE MALEATE 10 MG PO TABS
10.0000 mg | ORAL_TABLET | Freq: Once | ORAL | Status: AC
  Administered 2018-02-28: 10 mg via ORAL

## 2018-02-28 MED ORDER — SODIUM CHLORIDE 0.9 % IV SOLN
Freq: Once | INTRAVENOUS | Status: AC
  Administered 2018-02-28: 12:00:00 via INTRAVENOUS
  Filled 2018-02-28: qty 250

## 2018-02-28 MED ORDER — PROCHLORPERAZINE MALEATE 10 MG PO TABS
ORAL_TABLET | ORAL | Status: AC
  Filled 2018-02-28: qty 1

## 2018-02-28 MED ORDER — SODIUM CHLORIDE 0.9% FLUSH
10.0000 mL | INTRAVENOUS | Status: DC | PRN
  Administered 2018-02-28: 10 mL via INTRAVENOUS
  Filled 2018-02-28: qty 10

## 2018-02-28 MED ORDER — SODIUM CHLORIDE 0.9% FLUSH
10.0000 mL | INTRAVENOUS | Status: DC | PRN
  Filled 2018-02-28: qty 10

## 2018-02-28 NOTE — Progress Notes (Signed)
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 evaluation before starting cycle #22. She was advised to call immediately if she has any concerning symptoms in the interval. The patient voices understanding of current disease status and treatment options and is in agreement with the current care plan. All questions were answered. The patient knows to call the clinic with any problems, questions or concerns. We can certainly see the patient much sooner if necessary.  Disclaimer: This note was dictated with voice recognition software. Similar sounding words can inadvertently be transcribed and may not be corrected upon review.

## 2018-02-28 NOTE — Telephone Encounter (Signed)
Appointments scheduled AVS/Calendar printed per 8/5 los

## 2018-02-28 NOTE — Progress Notes (Signed)
Pt was worried that her port a cath was infected. Had Sandi Mealy PA come and look at it. He thinks it is not infected but that she may have a reaction from the tape from last weeks procedures. Applied opsite dressing and instructed patient to keep an eye on it.

## 2018-02-28 NOTE — Patient Instructions (Signed)
[facility] Discharge Instructions for Patients Receiving Chemotherapy  Today you received the following chemotherapy agents: Alimta  To help prevent nausea and vomiting after your treatment, we encourage you to take your nausea medication as directed.   If you develop nausea and vomiting that is not controlled by your nausea medication, call the clinic.   BELOW ARE SYMPTOMS THAT SHOULD BE REPORTED IMMEDIATELY:  *FEVER GREATER THAN 100.5 F  *CHILLS WITH OR WITHOUT FEVER  NAUSEA AND VOMITING THAT IS NOT CONTROLLED WITH YOUR NAUSEA MEDICATION  *UNUSUAL SHORTNESS OF BREATH  *UNUSUAL BRUISING OR BLEEDING  TENDERNESS IN MOUTH AND THROAT WITH OR WITHOUT PRESENCE OF ULCERS  *URINARY PROBLEMS  *BOWEL PROBLEMS  UNUSUAL RASH Items with * indicate a potential emergency and should be followed up as soon as possible.  Feel free to call the clinic should you have any questions or concerns. The clinic phone number is [phone].  Please show the [place] at check-in to the Emergency Department and triage nurse.

## 2018-02-28 NOTE — Addendum Note (Signed)
Encounter addended by: Malena Edman, RN on: 02/28/2018 10:20 AM  Actions taken: Charge Capture section accepted

## 2018-02-28 NOTE — Progress Notes (Signed)
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 radiographically doing well. She continues to be stable with single agent Alimta systemically. She will return for repeat brain MRI imaging in 3-4 months. She is in agreement with this plan. 2. Bilateral lower extremity edema. I have encouraged the patient to revisit this discussion with her PCP on Friday. She had a normal echo in May, but may need additional diuresis. She understands to be seen sooner if she has cardiopulmonary symptoms.  3. Dizziness. I am not sure that this is due to orthostasis since we could not reproduce symptoms and her BP was stable sitting and standing. She does not have features on her brain MRI to make me concerned that this would be the source of her symptoms. We will refer her to ENT to evaluate for possible BPV.     Carola Rhine, PAC

## 2018-03-01 ENCOUNTER — Telehealth: Payer: Self-pay | Admitting: *Deleted

## 2018-03-01 NOTE — Telephone Encounter (Signed)
CALLED PATIENT TO INFORM OF APPT. WITH DR. BATES ON 03-15-18 - ARRIVAL TIME- 1:15 PM , ADDRESS- [place], [place]. NO. - [phone], SPOKE WITH PATIENT AND SHE IS AWARE OF THIS APPT.

## 2018-03-21 ENCOUNTER — Inpatient Hospital Stay: Payer: Medicare Other

## 2018-03-21 ENCOUNTER — Encounter: Payer: Self-pay | Admitting: Internal Medicine

## 2018-03-21 ENCOUNTER — Inpatient Hospital Stay (HOSPITAL_BASED_OUTPATIENT_CLINIC_OR_DEPARTMENT_OTHER): Payer: Medicare Other | Admitting: Internal Medicine

## 2018-03-21 VITALS — BP 93/65 | HR 71 | Temp 97.6°F | Resp 16 | Ht 62.0 in | Wt 180.5 lb

## 2018-03-21 DIAGNOSIS — C3491 Malignant neoplasm of unspecified part of right bronchus or lung: Secondary | ICD-10-CM

## 2018-03-21 DIAGNOSIS — C7972 Secondary malignant neoplasm of left adrenal gland: Secondary | ICD-10-CM | POA: Diagnosis not present

## 2018-03-21 DIAGNOSIS — Z923 Personal history of irradiation: Secondary | ICD-10-CM

## 2018-03-21 DIAGNOSIS — C7931 Secondary malignant neoplasm of brain: Secondary | ICD-10-CM | POA: Diagnosis not present

## 2018-03-21 DIAGNOSIS — Z9221 Personal history of antineoplastic chemotherapy: Secondary | ICD-10-CM

## 2018-03-21 DIAGNOSIS — C801 Malignant (primary) neoplasm, unspecified: Principal | ICD-10-CM

## 2018-03-21 DIAGNOSIS — Z5111 Encounter for antineoplastic chemotherapy: Secondary | ICD-10-CM | POA: Diagnosis not present

## 2018-03-21 DIAGNOSIS — Z95828 Presence of other vascular implants and grafts: Secondary | ICD-10-CM

## 2018-03-21 LAB — CBC WITH DIFFERENTIAL (CANCER CENTER ONLY)
BASOS ABS: 0 10*3/uL (ref 0.0–0.1)
BASOS PCT: 0 %
EOS ABS: 0 10*3/uL (ref 0.0–0.5)
EOS PCT: 0 %
HCT: 35.7 % (ref 34.8–46.6)
HEMOGLOBIN: 12.2 g/dL (ref 11.6–15.9)
Lymphocytes Relative: 4 %
Lymphs Abs: 0.3 10*3/uL — ABNORMAL LOW (ref 0.9–3.3)
MCH: 32.7 pg (ref 25.1–34.0)
MCHC: 34.2 g/dL (ref 31.5–36.0)
MCV: 95.7 fL (ref 79.5–101.0)
Monocytes Absolute: 0.4 10*3/uL (ref 0.1–0.9)
Monocytes Relative: 5 %
Neutro Abs: 6.8 10*3/uL — ABNORMAL HIGH (ref 1.5–6.5)
Neutrophils Relative %: 91 %
PLATELETS: 159 10*3/uL (ref 145–400)
RBC: 3.73 MIL/uL (ref 3.70–5.45)
RDW: 16.3 % — ABNORMAL HIGH (ref 11.2–14.5)
WBC: 7.5 10*3/uL (ref 3.9–10.3)

## 2018-03-21 LAB — CMP (CANCER CENTER ONLY)
ALT: 17 U/L (ref 0–44)
AST: 22 U/L (ref 15–41)
Albumin: 3.2 g/dL — ABNORMAL LOW (ref 3.5–5.0)
Alkaline Phosphatase: 41 U/L (ref 38–126)
Anion gap: 7 (ref 5–15)
BILIRUBIN TOTAL: 0.6 mg/dL (ref 0.3–1.2)
BUN: 15 mg/dL (ref 8–23)
CO2: 26 mmol/L (ref 22–32)
CREATININE: 0.69 mg/dL (ref 0.44–1.00)
Calcium: 9.8 mg/dL (ref 8.9–10.3)
Chloride: 105 mmol/L (ref 98–111)
Glucose, Bld: 82 mg/dL (ref 70–99)
Potassium: 4.4 mmol/L (ref 3.5–5.1)
Sodium: 138 mmol/L (ref 135–145)
TOTAL PROTEIN: 5.9 g/dL — AB (ref 6.5–8.1)

## 2018-03-21 MED ORDER — SODIUM CHLORIDE 0.9% FLUSH
10.0000 mL | INTRAVENOUS | Status: DC | PRN
  Administered 2018-03-21: 10 mL via INTRAVENOUS
  Filled 2018-03-21: qty 10

## 2018-03-21 MED ORDER — HEPARIN SOD (PORK) LOCK FLUSH 100 UNIT/ML IV SOLN
500.0000 [IU] | Freq: Once | INTRAVENOUS | Status: AC | PRN
  Administered 2018-03-21: 500 [IU]
  Filled 2018-03-21: qty 5

## 2018-03-21 MED ORDER — PROCHLORPERAZINE MALEATE 10 MG PO TABS
ORAL_TABLET | ORAL | Status: AC
Start: 2018-03-21 — End: ?
  Filled 2018-03-21: qty 1

## 2018-03-21 MED ORDER — SODIUM CHLORIDE 0.9 % IV SOLN
500.0000 mg/m2 | Freq: Once | INTRAVENOUS | Status: AC
  Administered 2018-03-21: 900 mg via INTRAVENOUS
  Filled 2018-03-21: qty 36

## 2018-03-21 MED ORDER — SODIUM CHLORIDE 0.9 % IV SOLN
Freq: Once | INTRAVENOUS | Status: AC
  Administered 2018-03-21: 12:00:00 via INTRAVENOUS
  Filled 2018-03-21: qty 250

## 2018-03-21 MED ORDER — SODIUM CHLORIDE 0.9% FLUSH
10.0000 mL | INTRAVENOUS | Status: DC | PRN
  Administered 2018-03-21: 10 mL
  Filled 2018-03-21: qty 10

## 2018-03-21 MED ORDER — PROCHLORPERAZINE MALEATE 10 MG PO TABS
10.0000 mg | ORAL_TABLET | Freq: Once | ORAL | Status: AC
  Administered 2018-03-21: 10 mg via ORAL
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
## 2018-03-21 NOTE — Patient Instructions (Signed)
[facility] Discharge Instructions for Patients Receiving Chemotherapy  Today you received the following chemotherapy agents alimta.  To help prevent nausea and vomiting after your treatment, we encourage you to take your nausea medication as directed.   If you develop nausea and vomiting that is not controlled by your nausea medication, call the clinic.   BELOW ARE SYMPTOMS THAT SHOULD BE REPORTED IMMEDIATELY:  *FEVER GREATER THAN 100.5 F  *CHILLS WITH OR WITHOUT FEVER  NAUSEA AND VOMITING THAT IS NOT CONTROLLED WITH YOUR NAUSEA MEDICATION  *UNUSUAL SHORTNESS OF BREATH  *UNUSUAL BRUISING OR BLEEDING  TENDERNESS IN MOUTH AND THROAT WITH OR WITHOUT PRESENCE OF ULCERS  *URINARY PROBLEMS  *BOWEL PROBLEMS  UNUSUAL RASH Items with * indicate a potential emergency and should be followed up as soon as possible.  Feel free to call the clinic should you have any questions or concerns. The clinic phone number is [phone].  Please show the [facility] at check-in to the Emergency Department and triage nurse.

## 2018-03-27 DIAGNOSIS — K652 Spontaneous bacterial peritonitis: Secondary | ICD-10-CM

## 2018-03-27 HISTORY — DX: Spontaneous bacterial peritonitis: K65.2

## 2018-04-11 ENCOUNTER — Inpatient Hospital Stay: Payer: Medicare Other

## 2018-04-11 ENCOUNTER — Inpatient Hospital Stay: Payer: Medicare Other | Attending: Urology

## 2018-04-11 ENCOUNTER — Encounter: Payer: Self-pay | Admitting: Internal Medicine

## 2018-04-11 ENCOUNTER — Inpatient Hospital Stay (HOSPITAL_BASED_OUTPATIENT_CLINIC_OR_DEPARTMENT_OTHER): Payer: Medicare Other | Admitting: Internal Medicine

## 2018-04-11 VITALS — BP 125/80 | HR 73 | Temp 97.5°F | Resp 18 | Ht 62.0 in | Wt 180.7 lb

## 2018-04-11 DIAGNOSIS — C3491 Malignant neoplasm of unspecified part of right bronchus or lung: Secondary | ICD-10-CM

## 2018-04-11 DIAGNOSIS — C7972 Secondary malignant neoplasm of left adrenal gland: Secondary | ICD-10-CM

## 2018-04-11 DIAGNOSIS — Z923 Personal history of irradiation: Secondary | ICD-10-CM | POA: Insufficient documentation

## 2018-04-11 DIAGNOSIS — Z171 Estrogen receptor negative status [ER-]: Secondary | ICD-10-CM | POA: Diagnosis not present

## 2018-04-11 DIAGNOSIS — Z79899 Other long term (current) drug therapy: Secondary | ICD-10-CM | POA: Insufficient documentation

## 2018-04-11 DIAGNOSIS — F419 Anxiety disorder, unspecified: Secondary | ICD-10-CM | POA: Insufficient documentation

## 2018-04-11 DIAGNOSIS — C3431 Malignant neoplasm of lower lobe, right bronchus or lung: Secondary | ICD-10-CM | POA: Insufficient documentation

## 2018-04-11 DIAGNOSIS — C7931 Secondary malignant neoplasm of brain: Secondary | ICD-10-CM | POA: Insufficient documentation

## 2018-04-11 DIAGNOSIS — Z853 Personal history of malignant neoplasm of breast: Secondary | ICD-10-CM | POA: Diagnosis not present

## 2018-04-11 DIAGNOSIS — K449 Diaphragmatic hernia without obstruction or gangrene: Secondary | ICD-10-CM | POA: Insufficient documentation

## 2018-04-11 DIAGNOSIS — D6481 Anemia due to antineoplastic chemotherapy: Secondary | ICD-10-CM

## 2018-04-11 DIAGNOSIS — Z87440 Personal history of urinary (tract) infections: Secondary | ICD-10-CM | POA: Diagnosis not present

## 2018-04-11 DIAGNOSIS — K219 Gastro-esophageal reflux disease without esophagitis: Secondary | ICD-10-CM | POA: Insufficient documentation

## 2018-04-11 DIAGNOSIS — Z5111 Encounter for antineoplastic chemotherapy: Secondary | ICD-10-CM | POA: Insufficient documentation

## 2018-04-11 DIAGNOSIS — Z85828 Personal history of other malignant neoplasm of skin: Secondary | ICD-10-CM

## 2018-04-11 DIAGNOSIS — G4733 Obstructive sleep apnea (adult) (pediatric): Secondary | ICD-10-CM | POA: Insufficient documentation

## 2018-04-11 DIAGNOSIS — Z95828 Presence of other vascular implants and grafts: Secondary | ICD-10-CM

## 2018-04-11 DIAGNOSIS — Z9011 Acquired absence of right breast and nipple: Secondary | ICD-10-CM | POA: Insufficient documentation

## 2018-04-11 DIAGNOSIS — C801 Malignant (primary) neoplasm, unspecified: Secondary | ICD-10-CM

## 2018-04-11 DIAGNOSIS — T451X5A Adverse effect of antineoplastic and immunosuppressive drugs, initial encounter: Secondary | ICD-10-CM

## 2018-04-11 LAB — CMP (CANCER CENTER ONLY)
ALBUMIN: 3.2 g/dL — AB (ref 3.5–5.0)
ALK PHOS: 40 U/L (ref 38–126)
ALT: 14 U/L (ref 0–44)
AST: 21 U/L (ref 15–41)
Anion gap: 8 (ref 5–15)
BUN: 16 mg/dL (ref 8–23)
CHLORIDE: 104 mmol/L (ref 98–111)
CO2: 26 mmol/L (ref 22–32)
CREATININE: 0.69 mg/dL (ref 0.44–1.00)
Calcium: 10 mg/dL (ref 8.9–10.3)
GFR, Estimated: >60 mL/min (ref >60)
GLUCOSE: 109 mg/dL — AB (ref 70–99)
Potassium: 4.4 mmol/L (ref 3.5–5.1)
SODIUM: 138 mmol/L (ref 135–145)
Total Bilirubin: 0.7 mg/dL (ref 0.3–1.2)
Total Protein: 6 g/dL — ABNORMAL LOW (ref 6.5–8.1)

## 2018-04-11 LAB — CBC WITH DIFFERENTIAL (CANCER CENTER ONLY)
BASOS ABS: 0 10*3/uL (ref 0.0–0.1)
BASOS PCT: 0 %
EOS ABS: 0 10*3/uL (ref 0.0–0.5)
EOS PCT: 0 %
HCT: 34.2 % — ABNORMAL LOW (ref 34.8–46.6)
Hemoglobin: 11.6 g/dL (ref 11.6–15.9)
LYMPHS ABS: 0.3 10*3/uL — AB (ref 0.9–3.3)
Lymphocytes Relative: 4 %
MCH: 32.3 pg (ref 25.1–34.0)
MCHC: 33.9 g/dL (ref 31.5–36.0)
MCV: 95.3 fL (ref 79.5–101.0)
MONOS PCT: 4 %
Monocytes Absolute: 0.3 10*3/uL (ref 0.1–0.9)
NEUTROS PCT: 92 %
Neutro Abs: 6.5 10*3/uL (ref 1.5–6.5)
PLATELETS: 185 10*3/uL (ref 145–400)
RBC: 3.59 MIL/uL — AB (ref 3.70–5.45)
RDW: 16.3 % — ABNORMAL HIGH (ref 11.2–14.5)
WBC Count: 7.1 10*3/uL (ref 3.9–10.3)

## 2018-04-11 MED ORDER — HEPARIN SOD (PORK) LOCK FLUSH 100 UNIT/ML IV SOLN
500.0000 [IU] | Freq: Once | INTRAVENOUS | Status: AC | PRN
  Administered 2018-04-11: 500 [IU]
  Filled 2018-04-11: qty 5

## 2018-04-11 MED ORDER — SODIUM CHLORIDE 0.9 % IV SOLN
Freq: Once | INTRAVENOUS | Status: AC
  Administered 2018-04-11: 12:00:00 via INTRAVENOUS
  Filled 2018-04-11: qty 250

## 2018-04-11 MED ORDER — PROCHLORPERAZINE MALEATE 10 MG PO TABS
ORAL_TABLET | ORAL | Status: AC
  Filled 2018-04-11: qty 1

## 2018-04-11 MED ORDER — PROCHLORPERAZINE MALEATE 10 MG PO TABS
10.0000 mg | ORAL_TABLET | Freq: Once | ORAL | Status: AC
  Administered 2018-04-11: 10 mg via ORAL

## 2018-04-11 MED ORDER — SODIUM CHLORIDE 0.9% FLUSH
10.0000 mL | INTRAVENOUS | Status: DC | PRN
  Administered 2018-04-11: 10 mL
  Filled 2018-04-11: qty 10

## 2018-04-11 MED ORDER — SODIUM CHLORIDE 0.9% FLUSH
10.0000 mL | INTRAVENOUS | Status: DC | PRN
  Administered 2018-04-11: 10 mL via INTRAVENOUS
  Filled 2018-04-11: qty 10

## 2018-04-11 MED ORDER — SODIUM CHLORIDE 0.9 % IV SOLN
500.0000 mg/m2 | Freq: Once | INTRAVENOUS | Status: AC
  Administered 2018-04-11: 900 mg via INTRAVENOUS
  Filled 2018-04-11: qty 20

## 2018-04-11 NOTE — Progress Notes (Signed)
Pt finished the treatment, no blood return noted on her port Dr. Julien Nordmann made aware and okay to let the be discharged.

## 2018-04-11 NOTE — Patient Instructions (Signed)
[facility] Discharge Instructions for Patients Receiving Chemotherapy  Today you received the following chemotherapy agents Alimta To help prevent nausea and vomiting after your treatment, we encourage you to take your nausea medication as directed If you develop nausea and vomiting that is not controlled by your nausea medication, call the clinic.   BELOW ARE SYMPTOMS THAT SHOULD BE REPORTED IMMEDIATELY:  *FEVER GREATER THAN 100.5 F  *CHILLS WITH OR WITHOUT FEVER  NAUSEA AND VOMITING THAT IS NOT CONTROLLED WITH YOUR NAUSEA MEDICATION  *UNUSUAL SHORTNESS OF BREATH  *UNUSUAL BRUISING OR BLEEDING  TENDERNESS IN MOUTH AND THROAT WITH OR WITHOUT PRESENCE OF ULCERS  *URINARY PROBLEMS  *BOWEL PROBLEMS  UNUSUAL RASH Items with * indicate a potential emergency and should be followed up as soon as possible.  Feel free to call the clinic should you have any questions or concerns. The clinic phone number is [phone].  Please show the [facility] at check-in to the Emergency Department and triage nurse.
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
## 2018-04-15 ENCOUNTER — Encounter (HOSPITAL_COMMUNITY): Payer: Self-pay | Admitting: Emergency Medicine

## 2018-04-15 ENCOUNTER — Emergency Department (HOSPITAL_COMMUNITY)
Admission: EM | Admit: 2018-04-15 | Discharge: 2018-04-16 | Disposition: A | Discharge status: Home / Self Care | Payer: Medicare Other | Attending: Emergency Medicine | Admitting: Emergency Medicine

## 2018-04-15 ENCOUNTER — Other Ambulatory Visit: Payer: Self-pay

## 2018-04-15 DIAGNOSIS — I959 Hypotension, unspecified: Secondary | ICD-10-CM | POA: Diagnosis not present

## 2018-04-15 DIAGNOSIS — Z85118 Personal history of other malignant neoplasm of bronchus and lung: Secondary | ICD-10-CM | POA: Diagnosis not present

## 2018-04-15 DIAGNOSIS — Z853 Personal history of malignant neoplasm of breast: Secondary | ICD-10-CM | POA: Insufficient documentation

## 2018-04-15 DIAGNOSIS — Z79899 Other long term (current) drug therapy: Secondary | ICD-10-CM | POA: Insufficient documentation

## 2018-04-15 DIAGNOSIS — Z87891 Personal history of nicotine dependence: Secondary | ICD-10-CM | POA: Diagnosis not present

## 2018-04-15 NOTE — ED Triage Notes (Addendum)
Patient states she had chemo on Monday. Patient has been having fatigue. Patient states that when she checked her BP at home it was 86/64. Patient has no complaints of dizziness, light headedness, shortness of breath or chest pain. 102/69 on assessment.

## 2018-04-16 LAB — CBC WITH DIFFERENTIAL/PLATELET
Basophils Absolute: 0 10*3/uL (ref 0.0–0.1)
Basophils Relative: 0 %
EOS PCT: 2 %
Eosinophils Absolute: 0.1 10*3/uL (ref 0.0–0.7)
HCT: 32.7 % — ABNORMAL LOW (ref 36.0–46.0)
HEMOGLOBIN: 11.2 g/dL — AB (ref 12.0–15.0)
LYMPHS ABS: 0.4 10*3/uL — AB (ref 0.7–4.0)
LYMPHS PCT: 8 %
MCH: 32.1 pg (ref 26.0–34.0)
MCHC: 34.3 g/dL (ref 30.0–36.0)
MCV: 93.7 fL (ref 78.0–100.0)
Monocytes Absolute: 0.1 10*3/uL (ref 0.1–1.0)
Monocytes Relative: 2 %
Neutro Abs: 4.1 10*3/uL (ref 1.7–7.7)
Neutrophils Relative %: 88 %
Platelets: 174 10*3/uL (ref 150–400)
RBC: 3.49 MIL/uL — ABNORMAL LOW (ref 3.87–5.11)
RDW: 15.6 % — ABNORMAL HIGH (ref 11.5–15.5)
WBC: 4.7 10*3/uL (ref 4.0–10.5)

## 2018-04-16 LAB — BASIC METABOLIC PANEL
Anion gap: 8 (ref 5–15)
BUN: 19 mg/dL (ref 8–23)
CHLORIDE: 104 mmol/L (ref 98–111)
CO2: 27 mmol/L (ref 22–32)
CREATININE: 0.51 mg/dL (ref 0.44–1.00)
Calcium: 8.8 mg/dL — ABNORMAL LOW (ref 8.9–10.3)
GFR calc Af Amer: >60 mL/min (ref >60)
GFR calc non Af Amer: >60 mL/min (ref >60)
Glucose, Bld: 121 mg/dL — ABNORMAL HIGH (ref 70–99)
Potassium: 3.4 mmol/L — ABNORMAL LOW (ref 3.5–5.1)
SODIUM: 139 mmol/L (ref 135–145)

## 2018-04-16 MED ORDER — SODIUM CHLORIDE 0.9 % IV BOLUS
1000.0000 mL | Freq: Once | INTRAVENOUS | Status: AC
  Administered 2018-04-16: 1000 mL via INTRAVENOUS

## 2018-04-16 MED ORDER — HEPARIN SOD (PORK) LOCK FLUSH 100 UNIT/ML IV SOLN
INTRAVENOUS | Status: AC
  Administered 2018-04-16: 100 [IU]
  Filled 2018-04-16: qty 5

## 2018-04-16 NOTE — Discharge Instructions (Addendum)
Plenty of fluids for the next several days.  Follow-up with your oncologist next week and return to the ER if you experience additional problems.
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
## 2018-04-22 ENCOUNTER — Other Ambulatory Visit: Payer: Self-pay | Admitting: Oncology

## 2018-05-02 ENCOUNTER — Inpatient Hospital Stay: Payer: Medicare Other

## 2018-05-02 ENCOUNTER — Encounter: Payer: Self-pay | Admitting: Internal Medicine

## 2018-05-02 ENCOUNTER — Other Ambulatory Visit: Payer: Self-pay | Admitting: Internal Medicine

## 2018-05-02 ENCOUNTER — Inpatient Hospital Stay (HOSPITAL_BASED_OUTPATIENT_CLINIC_OR_DEPARTMENT_OTHER): Payer: Medicare Other | Admitting: Internal Medicine

## 2018-05-02 ENCOUNTER — Telehealth: Payer: Self-pay | Admitting: Internal Medicine

## 2018-05-02 ENCOUNTER — Inpatient Hospital Stay: Payer: Medicare Other | Attending: Urology

## 2018-05-02 VITALS — BP 93/61 | HR 84 | Temp 98.1°F | Resp 15 | Ht 62.0 in | Wt 185.4 lb

## 2018-05-02 DIAGNOSIS — K219 Gastro-esophageal reflux disease without esophagitis: Secondary | ICD-10-CM | POA: Diagnosis not present

## 2018-05-02 DIAGNOSIS — Z853 Personal history of malignant neoplasm of breast: Secondary | ICD-10-CM | POA: Insufficient documentation

## 2018-05-02 DIAGNOSIS — C801 Malignant (primary) neoplasm, unspecified: Secondary | ICD-10-CM

## 2018-05-02 DIAGNOSIS — Z171 Estrogen receptor negative status [ER-]: Secondary | ICD-10-CM

## 2018-05-02 DIAGNOSIS — Z5111 Encounter for antineoplastic chemotherapy: Secondary | ICD-10-CM | POA: Diagnosis not present

## 2018-05-02 DIAGNOSIS — C3490 Malignant neoplasm of unspecified part of unspecified bronchus or lung: Secondary | ICD-10-CM

## 2018-05-02 DIAGNOSIS — Z85828 Personal history of other malignant neoplasm of skin: Secondary | ICD-10-CM

## 2018-05-02 DIAGNOSIS — G4733 Obstructive sleep apnea (adult) (pediatric): Secondary | ICD-10-CM | POA: Insufficient documentation

## 2018-05-02 DIAGNOSIS — Z9011 Acquired absence of right breast and nipple: Secondary | ICD-10-CM | POA: Diagnosis not present

## 2018-05-02 DIAGNOSIS — C3491 Malignant neoplasm of unspecified part of right bronchus or lung: Secondary | ICD-10-CM

## 2018-05-02 DIAGNOSIS — K449 Diaphragmatic hernia without obstruction or gangrene: Secondary | ICD-10-CM | POA: Insufficient documentation

## 2018-05-02 DIAGNOSIS — C3431 Malignant neoplasm of lower lobe, right bronchus or lung: Secondary | ICD-10-CM | POA: Diagnosis not present

## 2018-05-02 DIAGNOSIS — C7931 Secondary malignant neoplasm of brain: Secondary | ICD-10-CM | POA: Diagnosis not present

## 2018-05-02 DIAGNOSIS — F419 Anxiety disorder, unspecified: Secondary | ICD-10-CM | POA: Insufficient documentation

## 2018-05-02 DIAGNOSIS — Z923 Personal history of irradiation: Secondary | ICD-10-CM

## 2018-05-02 DIAGNOSIS — Z87440 Personal history of urinary (tract) infections: Secondary | ICD-10-CM | POA: Diagnosis not present

## 2018-05-02 DIAGNOSIS — Z79899 Other long term (current) drug therapy: Secondary | ICD-10-CM | POA: Insufficient documentation

## 2018-05-02 DIAGNOSIS — C7972 Secondary malignant neoplasm of left adrenal gland: Secondary | ICD-10-CM | POA: Insufficient documentation

## 2018-05-02 DIAGNOSIS — Z95828 Presence of other vascular implants and grafts: Secondary | ICD-10-CM

## 2018-05-02 LAB — CMP (CANCER CENTER ONLY)
ALBUMIN: 2.8 g/dL — AB (ref 3.5–5.0)
ALK PHOS: 36 U/L — AB (ref 38–126)
ALT: 11 U/L (ref 0–44)
ANION GAP: 8 (ref 5–15)
AST: 24 U/L (ref 15–41)
BUN: 16 mg/dL (ref 8–23)
CO2: 26 mmol/L (ref 22–32)
Calcium: 9.2 mg/dL (ref 8.9–10.3)
Chloride: 101 mmol/L (ref 98–111)
Creatinine: 0.74 mg/dL (ref 0.44–1.00)
GFR, Est AFR Am: >60 mL/min (ref >60)
GFR, Estimated: >60 mL/min (ref >60)
GLUCOSE: 115 mg/dL — AB (ref 70–99)
POTASSIUM: 4 mmol/L (ref 3.5–5.1)
Sodium: 135 mmol/L (ref 135–145)
Total Bilirubin: 0.9 mg/dL (ref 0.3–1.2)
Total Protein: 5.5 g/dL — ABNORMAL LOW (ref 6.5–8.1)

## 2018-05-02 LAB — CBC WITH DIFFERENTIAL (CANCER CENTER ONLY)
Basophils Absolute: 0 10*3/uL (ref 0.0–0.1)
Basophils Relative: 0 %
Eosinophils Absolute: 0 10*3/uL (ref 0.0–0.5)
Eosinophils Relative: 0 %
HEMATOCRIT: 32.6 % — AB (ref 34.8–46.6)
Hemoglobin: 11.3 g/dL — ABNORMAL LOW (ref 11.6–15.9)
LYMPHS PCT: 3 %
Lymphs Abs: 0.2 10*3/uL — ABNORMAL LOW (ref 0.9–3.3)
MCH: 32.1 pg (ref 25.1–34.0)
MCHC: 34.8 g/dL (ref 31.5–36.0)
MCV: 92.3 fL (ref 79.5–101.0)
MONO ABS: 0.3 10*3/uL (ref 0.1–0.9)
MONOS PCT: 5 %
NEUTROS ABS: 6.1 10*3/uL (ref 1.5–6.5)
Neutrophils Relative %: 92 %
Platelet Count: 232 10*3/uL (ref 145–400)
RBC: 3.53 MIL/uL — ABNORMAL LOW (ref 3.70–5.45)
RDW: 16.3 % — AB (ref 11.2–14.5)
WBC Count: 6.6 10*3/uL (ref 3.9–10.3)

## 2018-05-02 MED ORDER — HEPARIN SOD (PORK) LOCK FLUSH 100 UNIT/ML IV SOLN
500.0000 [IU] | Freq: Once | INTRAVENOUS | Status: AC | PRN
  Administered 2018-05-02: 500 [IU]
  Filled 2018-05-02: qty 5

## 2018-05-02 MED ORDER — PROCHLORPERAZINE MALEATE 10 MG PO TABS
10.0000 mg | ORAL_TABLET | Freq: Once | ORAL | Status: AC
  Administered 2018-05-02: 10 mg via ORAL

## 2018-05-02 MED ORDER — SODIUM CHLORIDE 0.9% FLUSH
10.0000 mL | INTRAVENOUS | Status: DC | PRN
  Administered 2018-05-02: 10 mL
  Filled 2018-05-02: qty 10

## 2018-05-02 MED ORDER — SODIUM CHLORIDE 0.9 % IV SOLN
Freq: Once | INTRAVENOUS | Status: AC
  Administered 2018-05-02: 13:00:00 via INTRAVENOUS
  Filled 2018-05-02: qty 250

## 2018-05-02 MED ORDER — PROCHLORPERAZINE MALEATE 10 MG PO TABS
ORAL_TABLET | ORAL | Status: AC
  Filled 2018-05-02: qty 1

## 2018-05-02 MED ORDER — SODIUM CHLORIDE 0.9 % IV SOLN
500.0000 mg/m2 | Freq: Once | INTRAVENOUS | Status: AC
  Administered 2018-05-02: 900 mg via INTRAVENOUS
  Filled 2018-05-02: qty 36

## 2018-05-02 MED ORDER — CYANOCOBALAMIN 1000 MCG/ML IJ SOLN
1000.0000 ug | Freq: Once | INTRAMUSCULAR | Status: AC
  Administered 2018-05-02: 1000 ug via INTRAMUSCULAR

## 2018-05-02 MED ORDER — CYANOCOBALAMIN 1000 MCG/ML IJ SOLN
INTRAMUSCULAR | Status: AC
  Filled 2018-05-02: qty 1

## 2018-05-02 MED ORDER — SODIUM CHLORIDE 0.9% FLUSH
10.0000 mL | INTRAVENOUS | Status: DC | PRN
  Administered 2018-05-02: 10 mL via INTRAVENOUS
  Filled 2018-05-02: qty 10

## 2018-05-02 NOTE — Patient Instructions (Signed)
[facility] Discharge Instructions for Patients Receiving Chemotherapy  Today you received the following chemotherapy agents Alimta  To help prevent nausea and vomiting after your treatment, we encourage you to take your nausea medication as directed If you develop nausea and vomiting that is not controlled by your nausea medication, call the clinic.   BELOW ARE SYMPTOMS THAT SHOULD BE REPORTED IMMEDIATELY:  *FEVER GREATER THAN 100.5 F  *CHILLS WITH OR WITHOUT FEVER  NAUSEA AND VOMITING THAT IS NOT CONTROLLED WITH YOUR NAUSEA MEDICATION  *UNUSUAL SHORTNESS OF BREATH  *UNUSUAL BRUISING OR BLEEDING  TENDERNESS IN MOUTH AND THROAT WITH OR WITHOUT PRESENCE OF ULCERS  *URINARY PROBLEMS  *BOWEL PROBLEMS  UNUSUAL RASH Items with * indicate a potential emergency and should be followed up as soon as possible.  Feel free to call the clinic should you have any questions or concerns. The clinic phone number is [phone].  Please show the [facility] at check-in to the Emergency Department and triage nurse.
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
## 2018-05-02 NOTE — Telephone Encounter (Signed)
Apts already scheduled per 10/7 los - not additional appts added.

## 2018-05-05 ENCOUNTER — Other Ambulatory Visit: Payer: Self-pay

## 2018-05-05 ENCOUNTER — Emergency Department (HOSPITAL_COMMUNITY): Payer: Medicare Other

## 2018-05-05 ENCOUNTER — Inpatient Hospital Stay (HOSPITAL_COMMUNITY)
Admission: EM | Admit: 2018-05-05 | Discharge: 2018-05-13 | DRG: 372 | Disposition: A | Discharge status: Home Health | Payer: Medicare Other | Attending: Internal Medicine | Admitting: Internal Medicine

## 2018-05-05 ENCOUNTER — Telehealth: Payer: Self-pay | Admitting: *Deleted

## 2018-05-05 ENCOUNTER — Encounter (HOSPITAL_COMMUNITY): Payer: Self-pay

## 2018-05-05 DIAGNOSIS — D6481 Anemia due to antineoplastic chemotherapy: Secondary | ICD-10-CM | POA: Diagnosis present

## 2018-05-05 DIAGNOSIS — K769 Liver disease, unspecified: Secondary | ICD-10-CM | POA: Diagnosis present

## 2018-05-05 DIAGNOSIS — N390 Urinary tract infection, site not specified: Secondary | ICD-10-CM | POA: Diagnosis present

## 2018-05-05 DIAGNOSIS — R188 Other ascites: Secondary | ICD-10-CM | POA: Diagnosis present

## 2018-05-05 DIAGNOSIS — E876 Hypokalemia: Secondary | ICD-10-CM | POA: Diagnosis not present

## 2018-05-05 DIAGNOSIS — Z853 Personal history of malignant neoplasm of breast: Secondary | ICD-10-CM

## 2018-05-05 DIAGNOSIS — K5900 Constipation, unspecified: Secondary | ICD-10-CM

## 2018-05-05 DIAGNOSIS — Z515 Encounter for palliative care: Secondary | ICD-10-CM

## 2018-05-05 DIAGNOSIS — Z87891 Personal history of nicotine dependence: Secondary | ICD-10-CM

## 2018-05-05 DIAGNOSIS — D72819 Decreased white blood cell count, unspecified: Secondary | ICD-10-CM | POA: Diagnosis not present

## 2018-05-05 DIAGNOSIS — Z9989 Dependence on other enabling machines and devices: Secondary | ICD-10-CM

## 2018-05-05 DIAGNOSIS — C3491 Malignant neoplasm of unspecified part of right bronchus or lung: Secondary | ICD-10-CM | POA: Diagnosis not present

## 2018-05-05 DIAGNOSIS — Z90710 Acquired absence of both cervix and uterus: Secondary | ICD-10-CM

## 2018-05-05 DIAGNOSIS — Z8249 Family history of ischemic heart disease and other diseases of the circulatory system: Secondary | ICD-10-CM

## 2018-05-05 DIAGNOSIS — R197 Diarrhea, unspecified: Secondary | ICD-10-CM | POA: Diagnosis present

## 2018-05-05 DIAGNOSIS — Z6835 Body mass index (BMI) 35.0-35.9, adult: Secondary | ICD-10-CM

## 2018-05-05 DIAGNOSIS — Z85828 Personal history of other malignant neoplasm of skin: Secondary | ICD-10-CM

## 2018-05-05 DIAGNOSIS — C786 Secondary malignant neoplasm of retroperitoneum and peritoneum: Secondary | ICD-10-CM | POA: Diagnosis present

## 2018-05-05 DIAGNOSIS — R079 Chest pain, unspecified: Secondary | ICD-10-CM

## 2018-05-05 DIAGNOSIS — R6510 Systemic inflammatory response syndrome (SIRS) of non-infectious origin without acute organ dysfunction: Secondary | ICD-10-CM | POA: Diagnosis present

## 2018-05-05 DIAGNOSIS — R109 Unspecified abdominal pain: Secondary | ICD-10-CM | POA: Diagnosis present

## 2018-05-05 DIAGNOSIS — C787 Secondary malignant neoplasm of liver and intrahepatic bile duct: Secondary | ICD-10-CM | POA: Diagnosis present

## 2018-05-05 DIAGNOSIS — I10 Essential (primary) hypertension: Secondary | ICD-10-CM | POA: Diagnosis present

## 2018-05-05 DIAGNOSIS — Z7189 Other specified counseling: Secondary | ICD-10-CM

## 2018-05-05 DIAGNOSIS — R1084 Generalized abdominal pain: Secondary | ICD-10-CM | POA: Diagnosis not present

## 2018-05-05 DIAGNOSIS — C7989 Secondary malignant neoplasm of other specified sites: Secondary | ICD-10-CM | POA: Diagnosis present

## 2018-05-05 DIAGNOSIS — E871 Hypo-osmolality and hyponatremia: Secondary | ICD-10-CM | POA: Diagnosis present

## 2018-05-05 DIAGNOSIS — R180 Malignant ascites: Secondary | ICD-10-CM | POA: Diagnosis present

## 2018-05-05 DIAGNOSIS — K652 Spontaneous bacterial peritonitis: Principal | ICD-10-CM

## 2018-05-05 DIAGNOSIS — Z808 Family history of malignant neoplasm of other organs or systems: Secondary | ICD-10-CM

## 2018-05-05 DIAGNOSIS — Z171 Estrogen receptor negative status [ER-]: Secondary | ICD-10-CM

## 2018-05-05 DIAGNOSIS — T451X5A Adverse effect of antineoplastic and immunosuppressive drugs, initial encounter: Secondary | ICD-10-CM | POA: Diagnosis present

## 2018-05-05 DIAGNOSIS — C7931 Secondary malignant neoplasm of brain: Secondary | ICD-10-CM | POA: Diagnosis present

## 2018-05-05 DIAGNOSIS — R569 Unspecified convulsions: Secondary | ICD-10-CM

## 2018-05-05 DIAGNOSIS — B961 Klebsiella pneumoniae [K. pneumoniae] as the cause of diseases classified elsewhere: Secondary | ICD-10-CM | POA: Diagnosis present

## 2018-05-05 DIAGNOSIS — E860 Dehydration: Secondary | ICD-10-CM | POA: Diagnosis present

## 2018-05-05 DIAGNOSIS — G40909 Epilepsy, unspecified, not intractable, without status epilepticus: Secondary | ICD-10-CM | POA: Diagnosis present

## 2018-05-05 DIAGNOSIS — E669 Obesity, unspecified: Secondary | ICD-10-CM | POA: Diagnosis present

## 2018-05-05 DIAGNOSIS — Z86718 Personal history of other venous thrombosis and embolism: Secondary | ICD-10-CM

## 2018-05-05 DIAGNOSIS — Z79899 Other long term (current) drug therapy: Secondary | ICD-10-CM

## 2018-05-05 DIAGNOSIS — K219 Gastro-esophageal reflux disease without esophagitis: Secondary | ICD-10-CM | POA: Diagnosis present

## 2018-05-05 DIAGNOSIS — Z9221 Personal history of antineoplastic chemotherapy: Secondary | ICD-10-CM

## 2018-05-05 DIAGNOSIS — Z9011 Acquired absence of right breast and nipple: Secondary | ICD-10-CM

## 2018-05-05 DIAGNOSIS — G4733 Obstructive sleep apnea (adult) (pediatric): Secondary | ICD-10-CM

## 2018-05-05 DIAGNOSIS — I471 Supraventricular tachycardia: Secondary | ICD-10-CM | POA: Diagnosis not present

## 2018-05-05 LAB — CBC
HCT: 31 % — ABNORMAL LOW (ref 36.0–46.0)
HEMOGLOBIN: 10.4 g/dL — AB (ref 12.0–15.0)
MCH: 30.9 pg (ref 26.0–34.0)
MCHC: 33.5 g/dL (ref 30.0–36.0)
MCV: 92 fL (ref 80.0–100.0)
PLATELETS: 214 10*3/uL (ref 150–400)
RBC: 3.37 MIL/uL — ABNORMAL LOW (ref 3.87–5.11)
RDW: 15.8 % — AB (ref 11.5–15.5)
WBC: 11.2 10*3/uL — AB (ref 4.0–10.5)
nRBC: 0 % (ref 0.0–0.2)

## 2018-05-05 LAB — COMPREHENSIVE METABOLIC PANEL
ALK PHOS: 25 U/L — AB (ref 38–126)
ALT: 23 U/L (ref 0–44)
AST: 30 U/L (ref 15–41)
Albumin: 2.3 g/dL — ABNORMAL LOW (ref 3.5–5.0)
Anion gap: 10 (ref 5–15)
BILIRUBIN TOTAL: 1.5 mg/dL — AB (ref 0.3–1.2)
BUN: 17 mg/dL (ref 8–23)
CALCIUM: 8.4 mg/dL — AB (ref 8.9–10.3)
CO2: 23 mmol/L (ref 22–32)
CREATININE: 0.61 mg/dL (ref 0.44–1.00)
Chloride: 96 mmol/L — ABNORMAL LOW (ref 98–111)
GFR calc Af Amer: >60 mL/min (ref >60)
GLUCOSE: 113 mg/dL — AB (ref 70–99)
POTASSIUM: 3.2 mmol/L — AB (ref 3.5–5.1)
Sodium: 129 mmol/L — ABNORMAL LOW (ref 135–145)
TOTAL PROTEIN: 4.7 g/dL — AB (ref 6.5–8.1)

## 2018-05-05 LAB — LIPASE, BLOOD: Lipase: 23 U/L (ref 11–51)

## 2018-05-05 LAB — I-STAT TROPONIN, ED: Troponin i, poc: 0 ng/mL (ref 0.00–0.08)

## 2018-05-05 LAB — I-STAT CG4 LACTIC ACID, ED: Lactic Acid, Venous: 0.73 mmol/L (ref 0.5–1.9)

## 2018-05-05 MED ORDER — SODIUM CHLORIDE 0.9 % IV BOLUS
500.0000 mL | Freq: Once | INTRAVENOUS | Status: AC
  Administered 2018-05-06: 500 mL via INTRAVENOUS

## 2018-05-05 MED ORDER — IOPAMIDOL (ISOVUE-300) INJECTION 61%
INTRAVENOUS | Status: AC
  Filled 2018-05-05: qty 100

## 2018-05-05 MED ORDER — SODIUM CHLORIDE 0.9 % IJ SOLN
INTRAMUSCULAR | Status: AC
  Filled 2018-05-05: qty 50

## 2018-05-05 MED ORDER — IOPAMIDOL (ISOVUE-300) INJECTION 61%
100.0000 mL | Freq: Once | INTRAVENOUS | Status: AC | PRN
  Administered 2018-05-05: 100 mL via INTRAVENOUS

## 2018-05-05 MED ORDER — SODIUM CHLORIDE 0.9 % IV BOLUS
500.0000 mL | Freq: Once | INTRAVENOUS | Status: AC
  Administered 2018-05-05: 500 mL via INTRAVENOUS
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
## 2018-05-05 NOTE — Telephone Encounter (Signed)
"  Page [phone]).  Last two days my upper and lower stomach feels pressure and soreness.  When I'm up moving my stomach hurts about eight out of ten on pain scale.  As long as I'm seated or lying down I feel fine.   Liquid diarrhea stool a few minutes ago.  Yesterday stool was balls.  Tuesday I had a normal BM.  Took Maalox last night.   My stomach feels heavy.  This is the worst my stomach has been after treatment.  CT Abdomen is not scheduled."  Notifying provider.  Encouraged to increase water and warm beverages at this time.

## 2018-05-05 NOTE — ED Triage Notes (Signed)
Patient reports that she last had chemo  3 days ago and states she began having abdominal distention and pain since yesterday. Patient states she has gained 5 lbs since yesterday. Patient also reports N/V/D. Patient also took Miralax and a suppository today.

## 2018-05-06 ENCOUNTER — Observation Stay (HOSPITAL_COMMUNITY): Payer: Medicare Other

## 2018-05-06 DIAGNOSIS — C3491 Malignant neoplasm of unspecified part of right bronchus or lung: Secondary | ICD-10-CM | POA: Diagnosis not present

## 2018-05-06 DIAGNOSIS — R1084 Generalized abdominal pain: Secondary | ICD-10-CM | POA: Diagnosis not present

## 2018-05-06 LAB — URINALYSIS, ROUTINE W REFLEX MICROSCOPIC
Bilirubin Urine: NEGATIVE
Glucose, UA: NEGATIVE mg/dL
Ketones, ur: 5 mg/dL — AB
Leukocytes, UA: NEGATIVE
Nitrite: NEGATIVE
PROTEIN: NEGATIVE mg/dL
Specific Gravity, Urine: 1.041 — ABNORMAL HIGH (ref 1.005–1.030)
pH: 5 (ref 5.0–8.0)

## 2018-05-06 LAB — COMPREHENSIVE METABOLIC PANEL
ALBUMIN: 2.4 g/dL — AB (ref 3.5–5.0)
ALT: 21 U/L (ref 0–44)
ANION GAP: 7 (ref 5–15)
AST: 27 U/L (ref 15–41)
Alkaline Phosphatase: 25 U/L — ABNORMAL LOW (ref 38–126)
BILIRUBIN TOTAL: 1.6 mg/dL — AB (ref 0.3–1.2)
BUN: 15 mg/dL (ref 8–23)
CO2: 24 mmol/L (ref 22–32)
Calcium: 8.5 mg/dL — ABNORMAL LOW (ref 8.9–10.3)
Chloride: 100 mmol/L (ref 98–111)
Creatinine, Ser: 0.62 mg/dL (ref 0.44–1.00)
Glucose, Bld: 125 mg/dL — ABNORMAL HIGH (ref 70–99)
POTASSIUM: 3.4 mmol/L — AB (ref 3.5–5.1)
SODIUM: 131 mmol/L — AB (ref 135–145)
TOTAL PROTEIN: 4.8 g/dL — AB (ref 6.5–8.1)

## 2018-05-06 LAB — SODIUM, URINE, RANDOM: SODIUM UR: 27 mmol/L

## 2018-05-06 LAB — CBC WITH DIFFERENTIAL/PLATELET
Abs Immature Granulocytes: 0.09 10*3/uL — ABNORMAL HIGH (ref 0.00–0.07)
Basophils Absolute: 0 10*3/uL (ref 0.0–0.1)
Basophils Relative: 0 %
EOS ABS: 0.1 10*3/uL (ref 0.0–0.5)
Eosinophils Relative: 1 %
HEMATOCRIT: 31.6 % — AB (ref 36.0–46.0)
Hemoglobin: 10.5 g/dL — ABNORMAL LOW (ref 12.0–15.0)
Immature Granulocytes: 1 %
Lymphocytes Relative: 2 %
Lymphs Abs: 0.2 10*3/uL — ABNORMAL LOW (ref 0.7–4.0)
MCH: 31.3 pg (ref 26.0–34.0)
MCHC: 33.2 g/dL (ref 30.0–36.0)
MCV: 94 fL (ref 80.0–100.0)
Monocytes Absolute: 0.1 10*3/uL (ref 0.1–1.0)
Monocytes Relative: 1 %
NEUTROS PCT: 95 %
NRBC: 0 % (ref 0.0–0.2)
Neutro Abs: 9.9 10*3/uL — ABNORMAL HIGH (ref 1.7–7.7)
Platelets: 199 10*3/uL (ref 150–400)
RBC: 3.36 MIL/uL — ABNORMAL LOW (ref 3.87–5.11)
RDW: 15.7 % — ABNORMAL HIGH (ref 11.5–15.5)
WBC: 10.4 10*3/uL (ref 4.0–10.5)

## 2018-05-06 LAB — MAGNESIUM: Magnesium: 1.8 mg/dL (ref 1.7–2.4)

## 2018-05-06 LAB — PROTEIN, PLEURAL OR PERITONEAL FLUID

## 2018-05-06 LAB — BODY FLUID CELL COUNT WITH DIFFERENTIAL
Eos, Fluid: 0 %
LYMPHS FL: 19 %
MONOCYTE-MACROPHAGE-SEROUS FLUID: 22 % — AB (ref 50–90)
NEUTROPHIL FLUID: 59 % — AB (ref 0–25)
Total Nucleated Cell Count, Fluid: 6269 cu mm — ABNORMAL HIGH (ref 0–1000)

## 2018-05-06 LAB — OSMOLALITY, URINE: Osmolality, Ur: 695 mOsm/kg (ref 300–900)

## 2018-05-06 LAB — ALBUMIN, PLEURAL OR PERITONEAL FLUID: ALBUMIN FL: 1.6 g/dL

## 2018-05-06 LAB — MRSA PCR SCREENING: MRSA by PCR: NEGATIVE

## 2018-05-06 MED ORDER — SODIUM CHLORIDE 0.9% FLUSH
3.0000 mL | Freq: Two times a day (BID) | INTRAVENOUS | Status: DC
  Administered 2018-05-06 – 2018-05-11 (×7): 3 mL via INTRAVENOUS

## 2018-05-06 MED ORDER — STERILE WATER FOR INJECTION IJ SOLN
INTRAMUSCULAR | Status: AC
  Filled 2018-05-06: qty 10

## 2018-05-06 MED ORDER — SODIUM CHLORIDE 0.9% FLUSH
3.0000 mL | Freq: Two times a day (BID) | INTRAVENOUS | Status: DC
  Administered 2018-05-06 (×2): 3 mL via INTRAVENOUS

## 2018-05-06 MED ORDER — ONDANSETRON HCL 4 MG/2ML IJ SOLN
4.0000 mg | Freq: Four times a day (QID) | INTRAMUSCULAR | Status: DC | PRN
  Administered 2018-05-06 – 2018-05-10 (×3): 4 mg via INTRAVENOUS
  Filled 2018-05-06 (×3): qty 2

## 2018-05-06 MED ORDER — ACETAMINOPHEN 650 MG RE SUPP: 650.0000 mg | Freq: Four times a day (QID) | RECTAL | Status: DC | PRN

## 2018-05-06 MED ORDER — SENNOSIDES-DOCUSATE SODIUM 8.6-50 MG PO TABS
1.0000 | ORAL_TABLET | Freq: Every evening | ORAL | Status: DC | PRN
  Administered 2018-05-08: 1 via ORAL
  Filled 2018-05-06: qty 1

## 2018-05-06 MED ORDER — ADULT MULTIVITAMIN W/MINERALS CH
1.0000 | ORAL_TABLET | Freq: Every day | ORAL | Status: DC
  Administered 2018-05-06 – 2018-05-13 (×8): 1 via ORAL
  Filled 2018-05-06 (×8): qty 1

## 2018-05-06 MED ORDER — FENTANYL CITRATE (PF) 100 MCG/2ML IJ SOLN
25.0000 ug | INTRAMUSCULAR | Status: DC | PRN
  Administered 2018-05-06 – 2018-05-08 (×2): 25 ug via INTRAVENOUS
  Filled 2018-05-06 (×4): qty 2

## 2018-05-06 MED ORDER — POTASSIUM CHLORIDE CRYS ER 20 MEQ PO TBCR
20.0000 meq | EXTENDED_RELEASE_TABLET | Freq: Once | ORAL | Status: AC
  Administered 2018-05-06: 20 meq via ORAL
  Filled 2018-05-06: qty 1

## 2018-05-06 MED ORDER — PANTOPRAZOLE SODIUM 40 MG PO TBEC
40.0000 mg | DELAYED_RELEASE_TABLET | Freq: Every day | ORAL | Status: DC
  Administered 2018-05-06 – 2018-05-09 (×4): 40 mg via ORAL
  Filled 2018-05-06 (×4): qty 1

## 2018-05-06 MED ORDER — ALTEPLASE 2 MG IJ SOLR
2.0000 mg | Freq: Once | INTRAMUSCULAR | Status: AC
  Administered 2018-05-06: 2 mg

## 2018-05-06 MED ORDER — ENOXAPARIN SODIUM 120 MG/0.8ML ~~LOC~~ SOLN
120.0000 mg | SUBCUTANEOUS | Status: DC
  Administered 2018-05-07 – 2018-05-13 (×7): 120 mg via SUBCUTANEOUS
  Filled 2018-05-06 (×8): qty 0.8

## 2018-05-06 MED ORDER — LEVETIRACETAM 500 MG PO TABS
500.0000 mg | ORAL_TABLET | Freq: Two times a day (BID) | ORAL | Status: DC
  Administered 2018-05-06 – 2018-05-13 (×16): 500 mg via ORAL
  Filled 2018-05-06 (×16): qty 1

## 2018-05-06 MED ORDER — NEOMYCIN-POLYMYXIN-DEXAMETH 3.5-10000-0.1 OP SUSP
2.0000 [drp] | Freq: Four times a day (QID) | OPHTHALMIC | Status: DC
  Administered 2018-05-06 – 2018-05-13 (×28): 2 [drp] via OPHTHALMIC
  Filled 2018-05-06: qty 5

## 2018-05-06 MED ORDER — CHLORHEXIDINE GLUCONATE CLOTH 2 % EX PADS
6.0000 | MEDICATED_PAD | Freq: Every day | CUTANEOUS | Status: DC
  Administered 2018-05-07: 6 via TOPICAL

## 2018-05-06 MED ORDER — SODIUM CHLORIDE 0.9% FLUSH
10.0000 mL | Freq: Two times a day (BID) | INTRAVENOUS | Status: DC
  Administered 2018-05-06 – 2018-05-11 (×3): 10 mL

## 2018-05-06 MED ORDER — ONDANSETRON HCL 4 MG PO TABS: 4.0000 mg | ORAL_TABLET | Freq: Four times a day (QID) | ORAL | Status: DC | PRN

## 2018-05-06 MED ORDER — ENSURE ENLIVE PO LIQD
237.0000 mL | Freq: Every morning | ORAL | Status: DC
  Administered 2018-05-06 – 2018-05-12 (×7): 237 mL via ORAL

## 2018-05-06 MED ORDER — LIDOCAINE HCL 1 % IJ SOLN
INTRAMUSCULAR | Status: AC
  Filled 2018-05-06: qty 10

## 2018-05-06 MED ORDER — FOLIC ACID 1 MG PO TABS
1.0000 mg | ORAL_TABLET | Freq: Every day | ORAL | Status: DC
  Administered 2018-05-06 – 2018-05-13 (×8): 1 mg via ORAL
  Filled 2018-05-06 (×8): qty 1

## 2018-05-06 MED ORDER — SODIUM CHLORIDE 0.9% FLUSH: 3.0000 mL | INTRAVENOUS | Status: DC | PRN

## 2018-05-06 MED ORDER — POTASSIUM CHLORIDE 10 MEQ/100ML IV SOLN
10.0000 meq | Freq: Once | INTRAVENOUS | Status: AC
  Administered 2018-05-06: 10 meq via INTRAVENOUS
  Filled 2018-05-06: qty 100

## 2018-05-06 MED ORDER — CALCIUM CARBONATE-VITAMIN D 500-200 MG-UNIT PO TABS
1.0000 | ORAL_TABLET | Freq: Every day | ORAL | Status: DC
  Administered 2018-05-06 – 2018-05-13 (×7): 1 via ORAL
  Filled 2018-05-06 (×8): qty 1

## 2018-05-06 MED ORDER — ACETAMINOPHEN 325 MG PO TABS
650.0000 mg | ORAL_TABLET | Freq: Four times a day (QID) | ORAL | Status: DC | PRN
  Administered 2018-05-07: 650 mg via ORAL
  Filled 2018-05-06: qty 2

## 2018-05-06 MED ORDER — OXYCODONE HCL 5 MG PO TABS
10.0000 mg | ORAL_TABLET | Freq: Four times a day (QID) | ORAL | Status: DC | PRN
  Administered 2018-05-06 – 2018-05-13 (×13): 10 mg via ORAL
  Filled 2018-05-06 (×14): qty 2

## 2018-05-06 MED ORDER — SODIUM CHLORIDE 0.9% FLUSH
10.0000 mL | INTRAVENOUS | Status: DC | PRN
  Administered 2018-05-06: 10 mL
  Administered 2018-05-11: 30 mL
  Administered 2018-05-13: 10 mL
  Filled 2018-05-06 (×3): qty 40

## 2018-05-06 MED ORDER — SUCRALFATE 1 G PO TABS
1.0000 g | ORAL_TABLET | Freq: Four times a day (QID) | ORAL | Status: DC
  Administered 2018-05-06 – 2018-05-11 (×22): 1 g via ORAL
  Filled 2018-05-06 (×23): qty 1

## 2018-05-06 MED ORDER — ALBUMIN HUMAN 25 % IV SOLN
50.0000 g | Freq: Once | INTRAVENOUS | Status: DC | PRN
  Filled 2018-05-06: qty 200

## 2018-05-06 MED ORDER — SODIUM CHLORIDE 0.9 % IV SOLN
250.0000 mL | INTRAVENOUS | Status: DC | PRN
  Administered 2018-05-06 – 2018-05-12 (×4): 250 mL via INTRAVENOUS

## 2018-05-06 MED ORDER — ALPRAZOLAM 0.5 MG PO TABS
0.5000 mg | ORAL_TABLET | Freq: Two times a day (BID) | ORAL | Status: DC | PRN
  Administered 2018-05-06 – 2018-05-12 (×6): 0.5 mg via ORAL
  Filled 2018-05-06 (×6): qty 1

## 2018-05-06 NOTE — Procedures (Signed)
Ultrasound-guided diagnostic and therapeutic paracentesis performed yielding 1.9 liters of hazy, amber fluid. No immediate complications.  A portion of the fluid was submitted to the lab for preordered studies.
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
## 2018-05-06 NOTE — Progress Notes (Signed)
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  CT imaging done on this admission showed new hepatic masses, moderate ascites, involvement of the small bowel and omentum.  Undergoing paracentesis today.  Assessment & Plan:   Principal Problem:   Abdominal pain Active Problems:   Adenocarcinoma of right lung, stage 4 (HCC)   OSA on CPAP   Convulsions/seizures (HCC)   Brain metastases (HCC)   Hypokalemia   Antineoplastic chemotherapy induced anemia   Personal history of DVT (deep vein thrombosis)   SIRS (systemic inflammatory response syndrome) (HCC)   Hyponatremia   Liver lesion   Ascites   Diarrhea with dehydration  Abdominal pain: This is secondary to progressive metastatic disease and moderate ascites as seen on the scan.  Patient's abdomen pain has already improved this morning.  She is undergoing palliative paracentesis today.  We will also check fluid cytology and other labs.  Metastatic non-small cell lung cancer: Underwent chemotherapy on 05/02/2018. Follows with oncology,Dr Julien Nordmann , for treatment of Stage IV NSCLC with adenopathy, adrenal met, and brain mets  -Currently receiving Alimta every 3 weeks, last infusion on 10/7 - As noted above, ED workup reveals numerous new liver masses, masslike thickening of proximal jejunum, and omental implants with ascites, all concerning for metastatic disease. I discussed with Dr. Julien Nordmann today about the findings of the CT scan.  She will be seen at his office as an outpatient and can be discharged after paracentesis.  CEA and CA 125 will be sent.  We will follow cytology report from  paracentesis.  SIRS: Resolved.  Currently her vitals are stable.  Hypokalemia: Potassium supplemented.  Hypertension: Currently blood pressure is acceptable.  Losartan hydrochlorthiazide on hold due to low blood pressure previously on admission.  Normocytic anemia: Currently H&H is stable  Seizure disorder: Continue Keppra  History of DVT: Continue Lovenox at 1.5 mg/kg q24h    DVT prophylaxis: Lovenox Code Status: Full Family Communication: Family member present at the bed side Disposition Plan: Home tomorrow   Consultants: None  Procedures:Paracentesis  Antimicrobials:None  Subjective: Patient seen and examined the bedside this morning.  Feels better today.  Abdominal pain has improved.  Undergoing paracentesis today.  Nausea, vomiting, fever or chills.  Objective: Vitals:   05/06/18 0700 05/06/18 0800 05/06/18 1002 05/06/18 1013  BP: 99/68 (!) 93/58 116/70 108/70  Pulse: 95 98    Resp: (!) 29 (!) 24    Temp:  98.3 F (36.8 C)    TempSrc:  Oral    SpO2: 97% 94%    Weight:      Height:        Intake/Output Summary (Last 24 hours) at 05/06/2018 1016 Last data filed at 05/06/2018 0900 Gross per 24 hour  Intake 720 ml  Output 100 ml  Net 620 ml   Filed Weights   05/05/18 1839 05/06/18 0149 05/06/18 0439  Weight: 85.7 kg 87.2 kg 87.5 kg    Examination:  General exam: Appears calm and comfortable ,Not in distress,obese HEENT:PERRL,Oral mucosa moist, Ear/Nose normal on gross exam Respiratory system: Mostly clear with mild bibasilar crackles Cardiovascular system: S1 & S2 heard, RRR. No JVD, murmurs, rubs, gallops or clicks.  Trace pedal edema. Gastrointestinal system: Abdomen is distended, soft and nontender. No organomegaly or masses felt. Normal bowel sounds heard. Central nervous system: Alert and oriented. No focal neurological deficits. Extremities: Trace edema, no clubbing ,no cyanosis, distal peripheral pulses palpable. Skin: No rashes, lesions or  ulcers,no icterus ,no pallor MSK: Normal muscle bulk,tone ,power Psychiatry: Judgement and insight appear normal. Mood & affect appropriate.     Data Reviewed: I have personally reviewed following labs and imaging studies  CBC: Recent Labs  Lab 05/02/18 1058 05/05/18 1842 05/06/18 0225  WBC 6.6 11.2* 10.4  NEUTROABS 6.1  --  9.9*  HGB 11.3* 10.4* 10.5*  HCT 32.6* 31.0* 31.6*  MCV 92.3 92.0 94.0  PLT 232 214 979   Basic Metabolic Panel: Recent Labs  Lab 05/02/18 1058 05/05/18 1842 05/06/18 0225  NA 135 129* 131*  K 4.0 3.2* 3.4*  CL 101 96* 100  CO2 _0 GLUCOSE 115* 113* 125*  BUN _1 CREATININE 0.74 0.61 0.62  CALCIUM 9.2 8.4* 8.5*  MG  --   --  1.8   GFR: Estimated Creatinine Clearance: 63.4 mL/min (by C-G formula based on SCr of 0.62 mg/dL). Liver Function Tests: Recent Labs  Lab 05/02/18 1058 05/05/18 1842 05/06/18 0225  AST _2 ALT _3 ALKPHOS 36* 25* 25*  BILITOT 0.9 1.5* 1.6*  PROT 5.5* 4.7* 4.8*  ALBUMIN 2.8* 2.3* 2.4*   Recent Labs  Lab 05/05/18 1842  LIPASE 23   No results for input(s): AMMONIA in the last 168 hours. Coagulation Profile: No results for input(s): INR, PROTIME in the last 168 hours. Cardiac Enzymes: No results for input(s): CKTOTAL, CKMB, CKMBINDEX, TROPONINI in the last 168 hours. BNP (last 3 results) No results for input(s): PROBNP in the last 8760 hours. HbA1C: No results for input(s): HGBA1C in the last 72 hours. CBG: No results for input(s): GLUCAP in the last 168 hours. Lipid Profile: No results for input(s): CHOL, HDL, LDLCALC, TRIG, CHOLHDL, LDLDIRECT in the last 72 hours. Thyroid Function Tests: No results for input(s): TSH, T4TOTAL, FREET4, T3FREE, THYROIDAB in the last 72 hours. Anemia Panel: No results for input(s): VITAMINB12, FOLATE, FERRITIN, TIBC, IRON, RETICCTPCT in the last 72 hours. Sepsis Labs: Recent Labs  Lab 05/05/18 2108  LATICACIDVEN 0.73    Recent Results (from  the past 240 hour(s))  MRSA PCR Screening     Status: None   Collection Time: 05/06/18  1:51 AM  Result Value Ref Range Status   MRSA by PCR NEGATIVE NEGATIVE Final    Comment:        The GeneXpert MRSA Assay (FDA approved for NASAL specimens only), is one component of a comprehensive MRSA colonization surveillance program. It is not intended to diagnose MRSA infection nor to guide or monitor treatment for MRSA infections. Performed at [facility], [place] [address], [place],  [address]          Radiology Studies: Ct Chest W Contrast  Result Date: 05/05/2018 CLINICAL DATA:  Abdominal distention and pain. Weight loss. Chemotherapy for metastatic lung cancer EXAM: CT CHEST, ABDOMEN, AND PELVIS WITH CONTRAST TECHNIQUE: Multidetector CT imaging of the chest, abdomen and pelvis was performed following the standard protocol during bolus administration of intravenous contrast. CONTRAST:  165m ISOVUE-300 IOPAMIDOL (ISOVUE-300) INJECTION 61% COMPARISON:  02/25/2018 FINDINGS: CT CHEST FINDINGS Cardiovascular: Left Port-A-Cath tip: SVC. Atherosclerotic calcification of the aortic arch as well as the right and left anterior descending  coronary arteries. Mediastinum/Nodes: Mild chronic stranding in the mediastinum without discrete adenopathy in the chest. Lungs/Pleura: Centrilobular emphysema. Bilateral central scarring in the lungs favoring radiation port. This includes a bandlike density in the left lower lobe which is chronic. Ground-glass density nodule in the superior segment left lower lobe 1.3 by 1.0 cm, formerly 1.4 by 1.1 cm. Subsegmental atelectasis in the lingula is increased compared to the prior exam. A second ground-glass nodular density in the left lower lobe on image 45/4 stable at about 1.0 cm in diameter. Musculoskeletal: Thoracic spondylosis. Right mastectomy with breast implant. CT ABDOMEN PELVIS FINDINGS Hepatobiliary: In addition to some pre-existing  hepatic cysts, we demonstrate approximately 13 new solid masses in the liver compatible with metastatic disease, were not present on 02/25/2018. An index mass posteriorly in the lateral segment left hepatic lobe measures 3.2 by 2.9 cm (image 48/2). Cholecystectomy. Pancreas: Unremarkable Spleen: Unremarkable Adrenals/Urinary Tract: Unremarkable Stomach/Bowel: Abnormal focal thickening of the distal transverse colon is probably attributable to adjacent new omental tumor for example on image 57/2. There is also a markedly thickened short segment of proximal jejunum about 5 cm in length shown on image 83/5, tumor involvement of this segment of the gene jejunum is a distinct possibility. A new omental nodule measuring 1.1 cm diameter on image 81/2 is compatible with tumor deposit. Other more subtle omental tumor deposits are present. Vascular/Lymphatic: Aortoiliac atherosclerotic vascular disease. Reproductive: Uterus absent.  Adnexa unremarkable. Other: Considerable new ascites, likely malignant. Musculoskeletal: Subcutaneous edema along the anterior abdominal wall Fully segmental S1 level. 7 mm of degenerative anterolisthesis at L5-S1 with resulting bilateral foraminal impingement and at least moderate central narrowing of the thecal sac. IMPRESSION: 1. Numerous new hepatic masses compatible with metastatic lesions. 2. Masslike thickening of the proximal jejunum with adjacent nodularity, possibly from metastatic disease to the jejunum. 3. New omental tumor implants especially in the left upper quadrant along with moderate ascites compatible with peritoneal spread of tumor. There is tumor along the margin of a mildly thickened segment of distal transverse colon. 4. Stable ground-glass density nodules in the left lower lobe. 5. Other imaging findings of potential clinical significance: Aortic Atherosclerosis (ICD10-I70.0) and Emphysema (ICD10-J43.9). Coronary atherosclerosis. Subcutaneous edema along the anterior  abdominal wall. 7 mm anterolisthesis at L5-S1 with central and foraminal impingement. Fully segmental S1 vertebral level. Electronically Signed   By: Van Clines M.D.   On: 05/05/2018 23:16   Ct Abdomen Pelvis W Contrast  Result Date: 05/05/2018 CLINICAL DATA:  Abdominal distention and pain. Weight loss. Chemotherapy for metastatic lung cancer EXAM: CT CHEST, ABDOMEN, AND PELVIS WITH CONTRAST TECHNIQUE: Multidetector CT imaging of the chest, abdomen and pelvis was performed following the standard protocol during bolus administration of intravenous contrast. CONTRAST:  120m ISOVUE-300 IOPAMIDOL (ISOVUE-300) INJECTION 61% COMPARISON:  02/25/2018 FINDINGS: CT CHEST FINDINGS Cardiovascular: Left Port-A-Cath tip: SVC. Atherosclerotic calcification of the aortic arch as well as the right and left anterior descending coronary arteries. Mediastinum/Nodes: Mild chronic stranding in the mediastinum without discrete adenopathy in the chest. Lungs/Pleura: Centrilobular emphysema. Bilateral central scarring in the lungs favoring radiation port. This includes a bandlike density in the left lower lobe which is chronic. Ground-glass density nodule in the superior segment left lower lobe 1.3 by 1.0 cm, formerly 1.4 by 1.1 cm. Subsegmental atelectasis in the lingula is increased compared to the prior exam. A second ground-glass nodular density in the left lower lobe on image 45/4 stable at about 1.0 cm in diameter. Musculoskeletal: Thoracic spondylosis. Right mastectomy with  breast implant. CT ABDOMEN PELVIS FINDINGS Hepatobiliary: In addition to some pre-existing hepatic cysts, we demonstrate approximately 13 new solid masses in the liver compatible with metastatic disease, were not present on 02/25/2018. An index mass posteriorly in the lateral segment left hepatic lobe measures 3.2 by 2.9 cm (image 48/2). Cholecystectomy. Pancreas: Unremarkable Spleen: Unremarkable Adrenals/Urinary Tract: Unremarkable Stomach/Bowel:  Abnormal focal thickening of the distal transverse colon is probably attributable to adjacent new omental tumor for example on image 57/2. There is also a markedly thickened short segment of proximal jejunum about 5 cm in length shown on image 83/5, tumor involvement of this segment of the gene jejunum is a distinct possibility. A new omental nodule measuring 1.1 cm diameter on image 81/2 is compatible with tumor deposit. Other more subtle omental tumor deposits are present. Vascular/Lymphatic: Aortoiliac atherosclerotic vascular disease. Reproductive: Uterus absent.  Adnexa unremarkable. Other: Considerable new ascites, likely malignant. Musculoskeletal: Subcutaneous edema along the anterior abdominal wall Fully segmental S1 level. 7 mm of degenerative anterolisthesis at L5-S1 with resulting bilateral foraminal impingement and at least moderate central narrowing of the thecal sac. IMPRESSION: 1. Numerous new hepatic masses compatible with metastatic lesions. 2. Masslike thickening of the proximal jejunum with adjacent nodularity, possibly from metastatic disease to the jejunum. 3. New omental tumor implants especially in the left upper quadrant along with moderate ascites compatible with peritoneal spread of tumor. There is tumor along the margin of a mildly thickened segment of distal transverse colon. 4. Stable ground-glass density nodules in the left lower lobe. 5. Other imaging findings of potential clinical significance: Aortic Atherosclerosis (ICD10-I70.0) and Emphysema (ICD10-J43.9). Coronary atherosclerosis. Subcutaneous edema along the anterior abdominal wall. 7 mm anterolisthesis at L5-S1 with central and foraminal impingement. Fully segmental S1 vertebral level. Electronically Signed   By: Van Clines M.D.   On: 05/05/2018 23:16        Scheduled Meds: . lidocaine      . calcium-vitamin D  1 tablet Oral Q breakfast  . Chlorhexidine Gluconate Cloth  6 each Topical Daily  . enoxaparin  120  mg Subcutaneous Q24H  . feeding supplement (ENSURE ENLIVE)  237 mL Oral AC breakfast  . folic acid  1 mg Oral Daily  . levETIRAcetam  500 mg Oral BID  . multivitamin with minerals  1 tablet Oral Daily  . neomycin-polymyxin b-dexamethasone  2 drop Both Eyes Q6H  . pantoprazole  40 mg Oral Daily  . sodium chloride flush  10-40 mL Intracatheter Q12H  . sodium chloride flush  3 mL Intravenous Q12H  . sodium chloride flush  3 mL Intravenous Q12H  . sucralfate  1 g Oral QID   Continuous Infusions: . sodium chloride    . albumin human       LOS: 0 days    Time spent: 35 mins.More than 50% of that time was spent in counseling and/or coordination of care.      Shelly Coss, MD [facility] Pager [phone]  If 7PM-7AM, please contact night-coverage [email or web address] Password [email or web address] 05/06/2018, 10:16 AM

## 2018-05-07 ENCOUNTER — Other Ambulatory Visit: Payer: Self-pay

## 2018-05-07 DIAGNOSIS — K652 Spontaneous bacterial peritonitis: Secondary | ICD-10-CM

## 2018-05-07 DIAGNOSIS — D6481 Anemia due to antineoplastic chemotherapy: Secondary | ICD-10-CM | POA: Diagnosis not present

## 2018-05-07 DIAGNOSIS — Z515 Encounter for palliative care: Secondary | ICD-10-CM | POA: Diagnosis not present

## 2018-05-07 DIAGNOSIS — G4733 Obstructive sleep apnea (adult) (pediatric): Secondary | ICD-10-CM | POA: Diagnosis present

## 2018-05-07 DIAGNOSIS — Z86718 Personal history of other venous thrombosis and embolism: Secondary | ICD-10-CM | POA: Diagnosis not present

## 2018-05-07 DIAGNOSIS — C3491 Malignant neoplasm of unspecified part of right bronchus or lung: Secondary | ICD-10-CM | POA: Diagnosis present

## 2018-05-07 DIAGNOSIS — Z87891 Personal history of nicotine dependence: Secondary | ICD-10-CM | POA: Diagnosis not present

## 2018-05-07 DIAGNOSIS — Z85828 Personal history of other malignant neoplasm of skin: Secondary | ICD-10-CM | POA: Diagnosis not present

## 2018-05-07 DIAGNOSIS — E871 Hypo-osmolality and hyponatremia: Secondary | ICD-10-CM | POA: Diagnosis present

## 2018-05-07 DIAGNOSIS — K5900 Constipation, unspecified: Secondary | ICD-10-CM | POA: Diagnosis not present

## 2018-05-07 DIAGNOSIS — Z808 Family history of malignant neoplasm of other organs or systems: Secondary | ICD-10-CM | POA: Diagnosis not present

## 2018-05-07 DIAGNOSIS — C786 Secondary malignant neoplasm of retroperitoneum and peritoneum: Secondary | ICD-10-CM | POA: Diagnosis present

## 2018-05-07 DIAGNOSIS — C7492 Malignant neoplasm of unspecified part of left adrenal gland: Secondary | ICD-10-CM | POA: Diagnosis not present

## 2018-05-07 DIAGNOSIS — C787 Secondary malignant neoplasm of liver and intrahepatic bile duct: Secondary | ICD-10-CM | POA: Diagnosis present

## 2018-05-07 DIAGNOSIS — Z7189 Other specified counseling: Secondary | ICD-10-CM | POA: Diagnosis not present

## 2018-05-07 DIAGNOSIS — Z9011 Acquired absence of right breast and nipple: Secondary | ICD-10-CM | POA: Diagnosis not present

## 2018-05-07 DIAGNOSIS — R180 Malignant ascites: Secondary | ICD-10-CM | POA: Diagnosis present

## 2018-05-07 DIAGNOSIS — B961 Klebsiella pneumoniae [K. pneumoniae] as the cause of diseases classified elsewhere: Secondary | ICD-10-CM | POA: Diagnosis present

## 2018-05-07 DIAGNOSIS — C3490 Malignant neoplasm of unspecified part of unspecified bronchus or lung: Secondary | ICD-10-CM | POA: Diagnosis not present

## 2018-05-07 DIAGNOSIS — I471 Supraventricular tachycardia: Secondary | ICD-10-CM | POA: Diagnosis not present

## 2018-05-07 DIAGNOSIS — Z90710 Acquired absence of both cervix and uterus: Secondary | ICD-10-CM | POA: Diagnosis not present

## 2018-05-07 DIAGNOSIS — N390 Urinary tract infection, site not specified: Secondary | ICD-10-CM | POA: Diagnosis present

## 2018-05-07 DIAGNOSIS — C7989 Secondary malignant neoplasm of other specified sites: Secondary | ICD-10-CM | POA: Diagnosis present

## 2018-05-07 DIAGNOSIS — E876 Hypokalemia: Secondary | ICD-10-CM | POA: Diagnosis present

## 2018-05-07 DIAGNOSIS — G40909 Epilepsy, unspecified, not intractable, without status epilepticus: Secondary | ICD-10-CM | POA: Diagnosis present

## 2018-05-07 DIAGNOSIS — Z853 Personal history of malignant neoplasm of breast: Secondary | ICD-10-CM | POA: Diagnosis not present

## 2018-05-07 DIAGNOSIS — Z9221 Personal history of antineoplastic chemotherapy: Secondary | ICD-10-CM | POA: Diagnosis not present

## 2018-05-07 DIAGNOSIS — Z8249 Family history of ischemic heart disease and other diseases of the circulatory system: Secondary | ICD-10-CM | POA: Diagnosis not present

## 2018-05-07 DIAGNOSIS — C7931 Secondary malignant neoplasm of brain: Secondary | ICD-10-CM | POA: Diagnosis present

## 2018-05-07 DIAGNOSIS — R188 Other ascites: Secondary | ICD-10-CM | POA: Diagnosis present

## 2018-05-07 DIAGNOSIS — Z171 Estrogen receptor negative status [ER-]: Secondary | ICD-10-CM | POA: Diagnosis not present

## 2018-05-07 LAB — BASIC METABOLIC PANEL
Anion gap: 7 (ref 5–15)
BUN: 14 mg/dL (ref 8–23)
CHLORIDE: 100 mmol/L (ref 98–111)
CO2: 25 mmol/L (ref 22–32)
CREATININE: 0.59 mg/dL (ref 0.44–1.00)
Calcium: 8.2 mg/dL — ABNORMAL LOW (ref 8.9–10.3)
GFR calc non Af Amer: >60 mL/min (ref >60)
GLUCOSE: 104 mg/dL — AB (ref 70–99)
Potassium: 4 mmol/L (ref 3.5–5.1)
Sodium: 132 mmol/L — ABNORMAL LOW (ref 135–145)

## 2018-05-07 MED ORDER — SODIUM CHLORIDE 0.9 % IV SOLN
2.0000 g | Freq: Every day | INTRAVENOUS | Status: AC
  Administered 2018-05-07 – 2018-05-11 (×5): 2 g via INTRAVENOUS
  Filled 2018-05-07 (×5): qty 2

## 2018-05-07 MED ORDER — SODIUM CHLORIDE 0.9 % IV BOLUS
500.0000 mL | Freq: Once | INTRAVENOUS | Status: AC
  Administered 2018-05-07: 500 mL via INTRAVENOUS

## 2018-05-07 MED ORDER — METOPROLOL TARTRATE 5 MG/5ML IV SOLN
INTRAVENOUS | Status: AC
  Filled 2018-05-07: qty 5

## 2018-05-07 MED ORDER — METOPROLOL TARTRATE 5 MG/5ML IV SOLN
5.0000 mg | Freq: Once | INTRAVENOUS | Status: AC
  Administered 2018-05-07: 5 mg via INTRAVENOUS

## 2018-05-07 NOTE — Progress Notes (Signed)
Consistent low blood pressures reported to MD. Received order for 500cc bolus.

## 2018-05-07 NOTE — Progress Notes (Signed)
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  CT imaging done on this admission showed new hepatic masses, moderate ascites, involvement of the small bowel and omentum.  Underwent paracentesis and the fluid analysis is suggestive of spontaneous bacterial peritonitis.  Started on antibiotics.  Assessment & Plan:   Principal Problem:   SBP (spontaneous bacterial peritonitis) ([place]) Active Problems:   Adenocarcinoma of right lung, stage 4 (HCC)   OSA on CPAP   Convulsions/seizures (HCC)   Brain metastases (HCC)   Hypokalemia   Antineoplastic chemotherapy induced anemia   Personal history of DVT (deep vein thrombosis)   SIRS (systemic inflammatory response syndrome) (HCC)   Hyponatremia   Liver lesion   Ascites   Diarrhea with dehydration   Abdominal pain  SBP: Ascitic fluid analysis suggestive of SBP.  We will follow-up ascitic fluid culture.  Started on ceftriaxone.  Plan is to continue IV antibiotics until culture and sensitivity of the fluid analysis come back.  Abdominal pain: This is secondary to progressive metastatic disease ,SBPand moderate ascites as seen on the scan.  Patient's abdomen pain has already improved this morning.  She underwent paracentesis on 05/06/18 with removal of 1.9 L of fluid .We will also check fluid cytology .  Metastatic non-small cell lung cancer: Underwent chemotherapy on 05/02/2018. Follows with oncology,Dr Julien Nordmann , for treatment of Stage IV NSCLC with adenopathy, adrenal met, and brain mets  -Currently receiving Alimta every 3 weeks, last infusion on 10/7 - As noted above, ED workup reveals numerous  new liver masses, masslike thickening of proximal jejunum, and omental implants with ascites, all concerning for metastatic disease. I have discussed with Dr. Julien Nordmann  about the findings of the CT scan.  She will be seen at his office as an outpatient and can be discharged after paracentesis.  CEA and CA 125 pending.  We will follow cytology report from paracentesis.  SIRS: Resolved.  Currently her vitals are stable.  Hypokalemia: Potassium supplemented.  Hypertension: Currently blood pressure is acceptable.  Losartan hydrochlorthiazide on hold due to low blood pressure previously on admission.  Normocytic anemia: Currently H&H is stable  Seizure disorder: Continue Keppra  History of DVT: Continue Lovenox at 1.5 mg/kg q24h    DVT prophylaxis: Lovenox Code Status: Full Family Communication: Family members present at the bed side Disposition Plan: Home after final culture and sensitivity report of ascitic fluid.   Consultants: None  Procedures:Paracentesis  Antimicrobials:None  Subjective: Patient seen and examined the bedside this morning.  Feels better today.  Abdominal pain has improved.  No Nausea, vomiting, fever or chills.  She was having difficulty voiding.  Objective: Vitals:   05/07/18 0800 05/07/18 0820 05/07/18 0915 05/07/18 0950  BP: 103/61  91/74 (!) 101/56  Pulse: 80  88 89  Resp: 20  20 (!) 22  Temp:  99.8 F (37.7 C)    TempSrc:  Axillary    SpO2: 97%  100% 100%  Weight:      Height:        Intake/Output Summary (Last 24 hours) at 05/07/2018 1113 Last data filed at 05/07/2018 0750 Gross per 24 hour  Intake 657.46 ml  Output 450 ml  Net 207.46 ml   Filed Weights   05/06/18 0149 05/06/18 0439 05/07/18 0500  Weight: 87.2 kg 87.5 kg 86.6 kg    Examination:  General exam: Appears calm and comfortable ,Not in distress,obese HEENT:PERRL,Oral mucosa moist, Ear/Nose normal on gross exam Respiratory system: Mostly clear with mild bibasilar  crackles Cardiovascular system: S1 & S2 heard, RRR. No JVD, murmurs, rubs, gallops or clicks.  Trace pedal edema. Gastrointestinal system: Abdomen is distended, soft and has mild generalized tenderness. No organomegaly or masses felt. Normal bowel sounds heard. Central nervous system: Alert and oriented. No focal neurological deficits. Extremities: Trace edema, no clubbing ,no cyanosis, distal peripheral pulses palpable. Skin: No rashes, lesions or ulcers,no icterus ,no pallor MSK: Normal muscle bulk,tone ,power Psychiatry: Judgement and insight appear normal. Mood & affect appropriate.     Data Reviewed: I have personally reviewed following labs and imaging studies  CBC: Recent Labs  Lab 05/02/18 1058 05/05/18 1842 05/06/18 0225  WBC 6.6 11.2* 10.4  NEUTROABS 6.1  --  9.9*  HGB 11.3* 10.4* 10.5*  HCT 32.6* 31.0* 31.6*  MCV 92.3 92.0 94.0  PLT 232 214 712   Basic Metabolic Panel: Recent Labs  Lab 05/02/18 1058 05/05/18 1842 05/06/18 0225 05/07/18 0618  NA 135 129* 131* 132*  K 4.0 3.2* 3.4* 4.0  CL 101 96* 100 100  CO2 26 23 24 25   GLUCOSE 115* 113* 125* 104*  BUN 16 17 15 14   CREATININE 0.74 0.61 0.62 0.59  CALCIUM 9.2 8.4* 8.5* 8.2*  MG  --   --  1.8  --    GFR: Estimated Creatinine Clearance: 63 mL/min (by C-G formula based on SCr of 0.59 mg/dL). Liver Function Tests: Recent Labs  Lab 05/02/18 1058 05/05/18 1842 05/06/18 0225  AST 24 30 27   ALT 11 23 21   ALKPHOS 36* 25* 25*  BILITOT 0.9 1.5* 1.6*  PROT 5.5* 4.7* 4.8*  ALBUMIN 2.8* 2.3* 2.4*   Recent Labs  Lab 05/05/18 1842  LIPASE 23   No results for input(s): AMMONIA in the last 168 hours. Coagulation Profile: No results for input(s): INR, PROTIME in the last 168 hours. Cardiac Enzymes: No results for input(s): CKTOTAL, CKMB, CKMBINDEX, TROPONINI in the last 168 hours. BNP (last 3 results) No results for input(s): PROBNP in the last 8760 hours. HbA1C: No results for input(s): HGBA1C in the  last 72 hours. CBG: No results for input(s): GLUCAP in the last 168 hours. Lipid Profile: No results for input(s): CHOL, HDL, LDLCALC, TRIG, CHOLHDL, LDLDIRECT in the last 72 hours. Thyroid Function Tests: No results for input(s): TSH, T4TOTAL, FREET4, T3FREE, THYROIDAB in the last 72 hours. Anemia Panel: No results for input(s): VITAMINB12, FOLATE, FERRITIN, TIBC, IRON, RETICCTPCT in the last 72 hours. Sepsis Labs: Recent Labs  Lab 05/05/18 2108  LATICACIDVEN 0.73    Recent Results (from the past 240 hour(s))  Culture, Urine     Status: Abnormal (Preliminary result)   Collection Time: 05/06/18 12:48 AM  Result Value Ref Range Status   Specimen Description   Final    URINE, CLEAN CATCH Performed at [facility], [place] [address], [place], [place] [address]    Special Requests   Final    NONE Performed at [facility], [place] [address], [place], [place] [address]    Culture >=100,000 COLONIES/mL GRAM NEGATIVE RODS (A)  Final   Report Status PENDING  Incomplete  MRSA PCR Screening     Status: None   Collection  Time: 05/06/18  1:51 AM  Result Value Ref Range Status   MRSA by PCR NEGATIVE NEGATIVE Final    Comment:        The GeneXpert MRSA Assay (FDA approved for NASAL specimens only), is one component of a comprehensive MRSA colonization surveillance program. It is not intended to diagnose MRSA infection nor to guide or monitor treatment for MRSA infections. Performed at [facility], [place] [address] [place], [place] [address]   Culture, blood (routine x 2)     Status: None (Preliminary result)   Collection Time: 05/06/18  2:25 AM  Result Value Ref Range Status   Specimen Description   Final    BLOOD LEFT HAND Performed at [place] [address] [place], [place] [address]    Special Requests   Final    BOTTLES DRAWN AEROBIC AND ANAEROBIC Blood Culture adequate volume Performed at [place] [address] [place], [place] [address]    Culture   Final    NO GROWTH 1 DAY Performed at [facility], [place] [address], [place], [place] [address]    Report Status PENDING  Incomplete  Culture, blood (routine x 2)     Status: None (Preliminary result)   Collection Time: 05/06/18  2:25 AM  Result Value Ref Range Status   Specimen Description   Final    BLOOD LEFT ANTECUBITAL Performed at [place] [address], [place], [place] [address]    Special Requests   Final    BOTTLES DRAWN AEROBIC AND ANAEROBIC Blood Culture adequate volume Performed at [place] [address], [place], [place] [address]    Culture   Final    NO GROWTH 1 DAY Performed at [facility], [place] [address], [place], [place] [address]    Report Status PENDING  Incomplete  Body fluid culture     Status: None (Preliminary result)   Collection Time: 05/06/18 10:18 AM  Result Value Ref Range Status   Specimen Description   Final    PERITONEAL CAVITY Performed at [place] [address], [place], [place] [address]    Special Requests   Final    NONE Performed at [facility], [place] [address] [place], [place] [address]    Gram Stain   Final    MODERATE WBC PRESENT, PREDOMINANTLY PMN NO ORGANISMS SEEN    Culture   Final    NO GROWTH < 24 HOURS Performed at [place] [address] [place], [place] [address]    Report Status PENDING  Incomplete         Radiology Studies: Ct Chest W Contrast  Result Date: 05/05/2018 CLINICAL DATA:  Abdominal distention and pain. Weight loss. Chemotherapy for metastatic lung cancer EXAM: CT CHEST, ABDOMEN, AND PELVIS WITH CONTRAST TECHNIQUE: Multidetector CT imaging of the chest, abdomen and pelvis was performed following the standard protocol during bolus administration of intravenous contrast. CONTRAST:  153m ISOVUE-300  IOPAMIDOL (ISOVUE-300) INJECTION 61% COMPARISON:  02/25/2018 FINDINGS: CT CHEST FINDINGS Cardiovascular: Left Port-A-Cath tip: SVC. Atherosclerotic calcification of the aortic arch as well as the right and left anterior descending coronary arteries. Mediastinum/Nodes: Mild chronic stranding in the mediastinum without discrete adenopathy in the chest. Lungs/Pleura: Centrilobular emphysema. Bilateral central scarring in the lungs favoring radiation port. This includes a bandlike density in the left lower lobe which is chronic. Ground-glass density nodule in the superior  segment left lower lobe 1.3 by 1.0 cm, formerly 1.4 by 1.1 cm. Subsegmental atelectasis in the lingula is increased compared to the prior exam. A second ground-glass nodular density in the left lower lobe on image 45/4 stable at about 1.0 cm in diameter. Musculoskeletal: Thoracic spondylosis. Right mastectomy with breast implant. CT ABDOMEN PELVIS FINDINGS Hepatobiliary: In addition to some pre-existing hepatic cysts, we demonstrate approximately 13 new solid masses in the liver compatible with metastatic disease, were not present on 02/25/2018. An index mass posteriorly in the lateral segment left hepatic lobe measures 3.2 by 2.9 cm (image 48/2). Cholecystectomy. Pancreas: Unremarkable Spleen: Unremarkable Adrenals/Urinary Tract: Unremarkable Stomach/Bowel: Abnormal focal thickening of the distal transverse colon is probably attributable to adjacent new omental tumor for example on image 57/2. There is also a markedly thickened short segment of proximal jejunum about 5 cm in length shown on image 83/5, tumor involvement of this segment of the gene jejunum is a distinct possibility. A new omental nodule measuring 1.1 cm diameter on image 81/2 is compatible with tumor deposit. Other more subtle omental tumor deposits are present. Vascular/Lymphatic: Aortoiliac atherosclerotic vascular disease. Reproductive: Uterus absent.  Adnexa unremarkable. Other:  Considerable new ascites, likely malignant. Musculoskeletal: Subcutaneous edema along the anterior abdominal wall Fully segmental S1 level. 7 mm of degenerative anterolisthesis at L5-S1 with resulting bilateral foraminal impingement and at least moderate central narrowing of the thecal sac. IMPRESSION: 1. Numerous new hepatic masses compatible with metastatic lesions. 2. Masslike thickening of the proximal jejunum with adjacent nodularity, possibly from metastatic disease to the jejunum. 3. New omental tumor implants especially in the left upper quadrant along with moderate ascites compatible with peritoneal spread of tumor. There is tumor along the margin of a mildly thickened segment of distal transverse colon. 4. Stable ground-glass density nodules in the left lower lobe. 5. Other imaging findings of potential clinical significance: Aortic Atherosclerosis (ICD10-I70.0) and Emphysema (ICD10-J43.9). Coronary atherosclerosis. Subcutaneous edema along the anterior abdominal wall. 7 mm anterolisthesis at L5-S1 with central and foraminal impingement. Fully segmental S1 vertebral level. Electronically Signed   By: Van Clines M.D.   On: 05/05/2018 23:16   Ct Abdomen Pelvis W Contrast  Result Date: 05/05/2018 CLINICAL DATA:  Abdominal distention and pain. Weight loss. Chemotherapy for metastatic lung cancer EXAM: CT CHEST, ABDOMEN, AND PELVIS WITH CONTRAST TECHNIQUE: Multidetector CT imaging of the chest, abdomen and pelvis was performed following the standard protocol during bolus administration of intravenous contrast. CONTRAST:  154m ISOVUE-300 IOPAMIDOL (ISOVUE-300) INJECTION 61% COMPARISON:  02/25/2018 FINDINGS: CT CHEST FINDINGS Cardiovascular: Left Port-A-Cath tip: SVC. Atherosclerotic calcification of the aortic arch as well as the right and left anterior descending coronary arteries. Mediastinum/Nodes: Mild chronic stranding in the mediastinum without discrete adenopathy in the chest. Lungs/Pleura:  Centrilobular emphysema. Bilateral central scarring in the lungs favoring radiation port. This includes a bandlike density in the left lower lobe which is chronic. Ground-glass density nodule in the superior segment left lower lobe 1.3 by 1.0 cm, formerly 1.4 by 1.1 cm. Subsegmental atelectasis in the lingula is increased compared to the prior exam. A second ground-glass nodular density in the left lower lobe on image 45/4 stable at about 1.0 cm in diameter. Musculoskeletal: Thoracic spondylosis. Right mastectomy with breast implant. CT ABDOMEN PELVIS FINDINGS Hepatobiliary: In addition to some pre-existing hepatic cysts, we demonstrate approximately 13 new solid masses in the liver compatible with metastatic disease, were not present on 02/25/2018. An index mass posteriorly in the lateral segment left hepatic lobe measures 3.2  by 2.9 cm (image 48/2). Cholecystectomy. Pancreas: Unremarkable Spleen: Unremarkable Adrenals/Urinary Tract: Unremarkable Stomach/Bowel: Abnormal focal thickening of the distal transverse colon is probably attributable to adjacent new omental tumor for example on image 57/2. There is also a markedly thickened short segment of proximal jejunum about 5 cm in length shown on image 83/5, tumor involvement of this segment of the gene jejunum is a distinct possibility. A new omental nodule measuring 1.1 cm diameter on image 81/2 is compatible with tumor deposit. Other more subtle omental tumor deposits are present. Vascular/Lymphatic: Aortoiliac atherosclerotic vascular disease. Reproductive: Uterus absent.  Adnexa unremarkable. Other: Considerable new ascites, likely malignant. Musculoskeletal: Subcutaneous edema along the anterior abdominal wall Fully segmental S1 level. 7 mm of degenerative anterolisthesis at L5-S1 with resulting bilateral foraminal impingement and at least moderate central narrowing of the thecal sac. IMPRESSION: 1. Numerous new hepatic masses compatible with metastatic  lesions. 2. Masslike thickening of the proximal jejunum with adjacent nodularity, possibly from metastatic disease to the jejunum. 3. New omental tumor implants especially in the left upper quadrant along with moderate ascites compatible with peritoneal spread of tumor. There is tumor along the margin of a mildly thickened segment of distal transverse colon. 4. Stable ground-glass density nodules in the left lower lobe. 5. Other imaging findings of potential clinical significance: Aortic Atherosclerosis (ICD10-I70.0) and Emphysema (ICD10-J43.9). Coronary atherosclerosis. Subcutaneous edema along the anterior abdominal wall. 7 mm anterolisthesis at L5-S1 with central and foraminal impingement. Fully segmental S1 vertebral level. Electronically Signed   By: Van Clines M.D.   On: 05/05/2018 23:16   US Paracentesis  Result Date: 05/06/2018 INDICATION: Patient with prior history of breast cancer, stage IV non-small cell lung cancer, abdominal discomfort/distension, ascites. Request made for diagnostic and therapeutic paracentesis. EXAM: ULTRASOUND GUIDED DIAGNOSTIC AND THERAPEUTIC PARACENTESIS MEDICATIONS: None COMPLICATIONS: None immediate. PROCEDURE: Informed written consent was obtained from the patient after a discussion of the risks, benefits and alternatives to treatment. A timeout was performed prior to the initiation of the procedure. Initial ultrasound scanning demonstrates a small to moderate amount of ascites within the left mid to lower abdominal quadrant. The left mid to lower abdomen was prepped and draped in the usual sterile fashion. 1% lidocaine was used for local anesthesia. Following this, a 19 gauge, 10-cm, Yueh catheter was introduced. An ultrasound image was saved for documentation purposes. The paracentesis was performed. The catheter was removed and a dressing was applied. The patient tolerated the procedure well without immediate post procedural complication. FINDINGS: A total of  approximately 1.9 liters of hazy, amber fluid was removed. Samples were sent to the laboratory as requested by the clinical team. IMPRESSION: Successful ultrasound-guided diagnostic and therapeutic paracentesis yielding 1.9 liters of peritoneal fluid. Read by: Rowe Robert, PA-C Electronically Signed   By: Jacqulynn Cadet M.D.   On: 05/06/2018 10:58        Scheduled Meds: . calcium-vitamin D  1 tablet Oral Q breakfast  . Chlorhexidine Gluconate Cloth  6 each Topical Daily  . enoxaparin  120 mg Subcutaneous Q24H  . feeding supplement (ENSURE ENLIVE)  237 mL Oral AC breakfast  . folic acid  1 mg Oral Daily  . levETIRAcetam  500 mg Oral BID  . multivitamin with minerals  1 tablet Oral Daily  . neomycin-polymyxin b-dexamethasone  2 drop Both Eyes Q6H  . pantoprazole  40 mg Oral Daily  . sodium chloride flush  10-40 mL Intracatheter Q12H  . sodium chloride flush  3 mL Intravenous Q12H  .  sodium chloride flush  3 mL Intravenous Q12H  . sucralfate  1 g Oral QID   Continuous Infusions: . sodium chloride 250 mL (05/06/18 2133)  . albumin human    . cefTRIAXone (ROCEPHIN)  IV 2 g (05/07/18 1028)     LOS: 0 days    Time spent: 25 mins.More than 50% of that time was spent in counseling and/or coordination of care.      Shelly Coss, MD [facility] Pager [phone]  If 7PM-7AM, please contact night-coverage [email or web address] Password [email or web address] 05/07/2018, 11:13 AM

## 2018-05-07 NOTE — Evaluation (Signed)
Physical Therapy Evaluation Patient Details Name: Molly Black MRN: [id number] DOB: 03/07/1941 Today's Date: 05/07/2018   History of Present Illness  77 yo female with onset of abd pain and admission for peritonitis was referred to PT for mobility and dc planning.  Her SIRS is being managed and now is hoping to go home.  PMHx:  mult cancers including new liver mets, seizures, ascites, OSA, brain mets, UTI, schatzki's ring, chemotherapy, shoulder dislocation L, port a cath  Clinical Impression  Pt was seen for evaluation of mobility with good tolerance for gait using a paced protocol, but is weak in appearance to control descent of sitting.  Pt is able to manage with UE support, but noted the difficulty is not strength of legs but rather abd fluid making sitting awkward.  Follow up with PT for balance and endurance as needed.  HHPT for progression from hospital.    Follow Up Recommendations Home health PT;Supervision for mobility/OOB    Equipment Recommendations  Rolling walker with 5" wheels(if hers is not in good condition)    Recommendations for Other Services       Precautions / Restrictions Precautions Precautions: Fall Precaution Comments: cues for safety by asking nursing for help to walk Restrictions Weight Bearing Restrictions: No      Mobility  Bed Mobility Overal bed mobility: Needs Assistance Bed Mobility: Supine to Sit     Supine to sit: Min guard;Min assist     General bed mobility comments: minor help as she is weak in trunk but could scoot out to EOB once sitting  Transfers Overall transfer level: Modified independent Equipment used: Rolling walker (2 wheeled);1 person hand held assist             General transfer comment: one person to assure safety  Ambulation/Gait Ambulation/Gait assistance: Min guard Gait Distance (Feet): 120 Feet Assistive device: Rolling walker (2 wheeled);IV Pole Gait Pattern/deviations: Step-through pattern;Wide base of  support;Drifts right/left;Decreased stride length Gait velocity: reduced Gait velocity interpretation: <1.31 ft/sec, indicative of household ambulator General Gait Details: care to provide support standign with IV pole so took a trip in her room with RW  Stairs            Wheelchair Mobility    Modified Rankin (Stroke Patients Only)       Balance Overall balance assessment: Needs assistance Sitting-balance support: Feet supported Sitting balance-Leahy Scale: Good     Standing balance support: Single extremity supported;Bilateral upper extremity supported Standing balance-Leahy Scale: Fair Standing balance comment: needs BUE support as she fatigues                             Pertinent Vitals/Pain Pain Assessment: No/denies pain    Home Living Family/patient expects to be discharged to:: Private residence Living Arrangements: Alone Available Help at Discharge: Family;Available PRN/intermittently Type of Home: House       Home Layout: One level Home Equipment: Vega - 2 wheels;Cane - single point Additional Comments: has equipment despite not recently needing any    Prior Function Level of Independence: Independent         Comments: has been getting chemotherapy recently with two admissions     Hand Dominance   Dominant Hand: Right    Extremity/Trunk Assessment   Upper Extremity Assessment Upper Extremity Assessment: Overall WFL for tasks assessed    Lower Extremity Assessment Lower Extremity Assessment: Overall WFL for tasks assessed    Cervical / Trunk  Assessment Cervical / Trunk Assessment: Normal  Communication   Communication: No difficulties  Cognition Arousal/Alertness: Awake/alert Behavior During Therapy: WFL for tasks assessed/performed Overall Cognitive Status: Within Functional Limits for tasks assessed                                        General Comments General comments (skin integrity, edema,  etc.): Pt is having pain meds with note that she is maybe a bit more unsteady from them    Exercises     Assessment/Plan    PT Assessment Patient needs continued PT services  PT Problem List Decreased range of motion;Decreased activity tolerance;Decreased balance;Decreased mobility;Decreased coordination;Decreased knowledge of use of DME;Decreased safety awareness;Obesity       PT Treatment Interventions DME instruction;Gait training;Functional mobility training;Therapeutic activities;Therapeutic exercise;Balance training;Neuromuscular re-education;Patient/family education    PT Goals (Current goals can be found in the Care Plan section)  Acute Rehab PT Goals Patient Stated Goal: to walk and get home PT Goal Formulation: With patient/family Time For Goal Achievement: 05/21/18 Potential to Achieve Goals: Good    Frequency Min 3X/week   Barriers to discharge Decreased caregiver support home alone with no continuous support    Co-evaluation               AM-PAC PT "6 Clicks" Daily Activity  Outcome Measure Difficulty turning over in bed (including adjusting bedclothes, sheets and blankets)?: A Little Difficulty moving from lying on back to sitting on the side of the bed? : Unable Difficulty sitting down on and standing up from a chair with arms (e.g., wheelchair, bedside commode, etc,.)?: Unable Help needed moving to and from a bed to chair (including a wheelchair)?: A Little Help needed walking in hospital room?: A Little Help needed climbing 3-5 steps with a railing? : A Little 6 Click Score: 14    End of Session Equipment Utilized During Treatment: Gait belt Activity Tolerance: Patient tolerated treatment well;Patient limited by fatigue Patient left: in bed;with call bell/phone within reach;with family/visitor present;with nursing/sitter in room(sitting on side of bed) Nurse Communication: Mobility status PT Visit Diagnosis: Unsteadiness on feet (R26.81);Difficulty  in walking, not elsewhere classified (R26.2)    Time: 1725-1750 PT Time Calculation (min) (ACUTE ONLY): 25 min   Charges:   PT Evaluation $PT Eval Moderate Complexity: 1 Mod PT Treatments $Gait Training: 8-22 mins       Ramond Dial 05/07/2018, 6:04 PM   Mee Hives, PT MS Acute Rehab Dept. Number: [place] [facility] [place]

## 2018-05-07 NOTE — Progress Notes (Signed)
At 0535 pt's heart rate increased from 60's to 160's. EKG obtained, valsalva maneuver used without response. Dr. Posey Pronto notified, lopressor 5mg  iv ordered given at 0605, HR 83 at 0610. Pt had no sx. BP stable at 93/65 (70). Will continue to monitor and report off to oncoming RN, Maudie Mercury. Hoyle Barr, RN

## 2018-05-07 NOTE — Progress Notes (Signed)
Patient unable to void in using bedpan. Bladder scan showed 329 ml in bladder. Assisted patient to bedside commode with assist x 2 and patient able to void 200 ml dark amber urine.

## 2018-05-08 ENCOUNTER — Other Ambulatory Visit: Payer: Self-pay

## 2018-05-08 ENCOUNTER — Inpatient Hospital Stay (HOSPITAL_COMMUNITY): Payer: Medicare Other

## 2018-05-08 DIAGNOSIS — Z7189 Other specified counseling: Secondary | ICD-10-CM

## 2018-05-08 DIAGNOSIS — Z515 Encounter for palliative care: Secondary | ICD-10-CM

## 2018-05-08 LAB — URINE CULTURE

## 2018-05-08 LAB — CA 125: CANCER ANTIGEN (CA) 125: 84.3 U/mL — AB (ref 0.0–38.1)

## 2018-05-08 LAB — CEA: CEA: 2.1 ng/mL (ref 0.0–4.7)

## 2018-05-08 LAB — TROPONIN I: Troponin I: <0.03 ng/mL (ref <0.03)

## 2018-05-08 MED ORDER — BISACODYL 10 MG RE SUPP
10.0000 mg | Freq: Once | RECTAL | Status: AC
  Administered 2018-05-08: 10 mg via RECTAL
  Filled 2018-05-08: qty 1

## 2018-05-08 MED ORDER — SENNOSIDES-DOCUSATE SODIUM 8.6-50 MG PO TABS
2.0000 | ORAL_TABLET | Freq: Every day | ORAL | Status: DC
  Administered 2018-05-09 – 2018-05-11 (×3): 2 via ORAL
  Filled 2018-05-08 (×3): qty 2

## 2018-05-08 MED ORDER — GADOBUTROL 1 MMOL/ML IV SOLN
10.0000 mL | Freq: Once | INTRAVENOUS | Status: AC | PRN
  Administered 2018-05-08: 8.5 mL via INTRAVENOUS

## 2018-05-08 MED ORDER — POLYETHYLENE GLYCOL 3350 17 G PO PACK
17.0000 g | PACK | Freq: Every day | ORAL | Status: DC
  Administered 2018-05-08 – 2018-05-11 (×4): 17 g via ORAL
  Filled 2018-05-08 (×4): qty 1

## 2018-05-08 MED ORDER — MORPHINE SULFATE (PF) 2 MG/ML IV SOLN
INTRAVENOUS | Status: AC
  Filled 2018-05-08: qty 1

## 2018-05-08 MED ORDER — MORPHINE SULFATE (PF) 2 MG/ML IV SOLN
2.0000 mg | Freq: Once | INTRAVENOUS | Status: AC
  Administered 2018-05-08: 2 mg via INTRAVENOUS

## 2018-05-08 NOTE — Progress Notes (Signed)
RRT called to room reference chest pain, EGK done, Dr. Rozann Lesches in room to evaluate.  Orders given.  Monitored for roughly 1 hour, no signs of distress, Chest pain is now more in the back between shoulder blades, pain is releived with towel rolled up and placed to back.  Family in room at time, concerned that they have not hear or talked with [name] or Oncology regarding her care.  Did not see an oncology consult on chart will recommend to Dr. Rozann Lesches.  Felecia Jan ICU/SD Care Coordinator / Rapid Response Nurse Rapid Response Number:  [phone]

## 2018-05-08 NOTE — Progress Notes (Signed)
Palliative care brief progress note  Met today with patient and her family.   We discussed her clinical course including discovery of progression of cancer and malignant ascites with SBP.  Molly Black is resistant in her acceptance of changes and progression of cancer and reports that she needs to discuss with Dr. Julien Nordmann prior to accepting changes and discussing care plan moving forward.  Full note to follow.  Molly Rough, MD [place] Team [phone]

## 2018-05-08 NOTE — Progress Notes (Signed)
Per RN, pt will be given suppository soon.  Pt prefers to wait until after that is given before wearing cpap.  RN will call me when pt is ready for cpap.

## 2018-05-08 NOTE — Progress Notes (Signed)
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  CT imaging done on this admission showed new hepatic masses, moderate ascites, involvement of the small bowel and omentum.  Underwent paracentesis and the fluid analysis is suggestive of spontaneous bacterial peritonitis.  Started on antibiotics.  Assessment & Plan:   Principal Problem:   SBP (spontaneous bacterial peritonitis) ([place]) Active Problems:   Adenocarcinoma of right lung, stage 4 (HCC)   OSA on CPAP   Convulsions/seizures (HCC)   Brain metastases (HCC)   Hypokalemia   Antineoplastic chemotherapy induced anemia   Personal history of DVT (deep vein thrombosis)   SIRS (systemic inflammatory response syndrome) (HCC)   Hyponatremia   Liver lesion   Ascites   Diarrhea with dehydration   Abdominal pain  SBP: Ascitic fluid analysis suggestive of SBP.  We will follow-up ascitic fluid culture.  Started on ceftriaxone.  Plan is to continue IV antibiotics until culture and sensitivity of the fluid analysis come back.  Abdominal pain: This is secondary to progressive metastatic disease ,SBPand moderate ascites as seen on the scan.  Patient's abdomen pain has already improved after apa.  She underwent paracentesis on 05/06/18 with removal of 1.9 L of fluid .We will also check fluid cytology .  Metastatic non-small cell lung cancer: Underwent chemotherapy on 05/02/2018. Follows with oncology,Dr Julien Nordmann , for treatment of Stage IV NSCLC with adenopathy, adrenal met, and brain mets  Currently receiving Alimta every 3 weeks, last infusion on 10/7 - As noted above, ED workup reveals numerous new  liver masses, masslike thickening of proximal jejunum, and omental implants with ascites, all concerning for metastatic disease. I have discussed with Dr. Julien Nordmann  about the findings of the CT scan.  She will be seen at his office as an outpatient. CEA and CA 125 pending.  We will follow cytology report from paracentesis.\ After discussion with her family this morning, we decided to consult palliative care for goals of care.  Hypokalemia: Potassium supplemented.  Hypertension: Currently blood pressure is acceptable.  Losartan hydrochlorthiazide on hold due to low blood pressure previously on admission.  Normocytic anemia: Currently H&H is stable  Seizure disorder: Continue Keppra  History of DVT: Continue Lovenox at 1.5 mg/kg q24h  Constipation: Bowel regimen started.    DVT prophylaxis: Lovenox Code Status: Full Family Communication: Family members present at the bed side Disposition Plan: Home after final culture and sensitivity report of ascitic fluid, palliative care evaluation   Consultants: None  Procedures:Paracentesis  Antimicrobials:None  Subjective: Patient seen and examined the bedside this morning.  Feels better today.  Abdominal pain has improved.  No Nausea, vomiting, fever or chills.  She has been voiding fine now.  Problem is constipation now.  Objective: Vitals:   05/07/18 1333 05/07/18 2104 05/07/18 2300 05/08/18 0555  BP: (!) 94/57 97/64  97/64  Pulse: 89 94  80  Resp:  18  20  Temp:  99.5 F (37.5 C) 97.7 F (36.5 C) 98.3 F (36.8 C)  TempSrc:  Oral Oral Oral  SpO2:  92%  100%  Weight:    87.9 kg  Height:        Intake/Output Summary (Last 24 hours) at  05/08/2018 1300 Last data filed at 05/07/2018 2200 Gross per 24 hour  Intake 940 ml  Output 350 ml  Net 590 ml   Filed Weights   05/06/18 0439 05/07/18 0500 05/08/18 0555  Weight: 87.5 kg 86.6 kg 87.9 kg    Examination:  General exam: Appears calm and comfortable ,Not in  distress,obese HEENT:PERRL,Oral mucosa moist, Ear/Nose normal on gross exam Respiratory system: Mostly clear with mild bibasilar crackles Cardiovascular system: S1 & S2 heard, RRR. No JVD, murmurs, rubs, gallops or clicks.  Trace pedal edema. Gastrointestinal system: Abdomen is distended, soft and has mild generalized tenderness. No organomegaly or masses felt. Normal bowel sounds heard. Central nervous system: Alert and oriented. No focal neurological deficits. Extremities: Trace edema, no clubbing ,no cyanosis, distal peripheral pulses palpable. Skin: No rashes, lesions or ulcers,no icterus ,no pallor MSK: Normal muscle bulk,tone ,power Psychiatry: Judgement and insight appear normal. Mood & affect appropriate.     Data Reviewed: I have personally reviewed following labs and imaging studies  CBC: Recent Labs  Lab 05/02/18 1058 05/05/18 1842 05/06/18 0225  WBC 6.6 11.2* 10.4  NEUTROABS 6.1  --  9.9*  HGB 11.3* 10.4* 10.5*  HCT 32.6* 31.0* 31.6*  MCV 92.3 92.0 94.0  PLT 232 214 655   Basic Metabolic Panel: Recent Labs  Lab 05/02/18 1058 05/05/18 1842 05/06/18 0225 05/07/18 0618  NA 135 129* 131* 132*  K 4.0 3.2* 3.4* 4.0  CL 101 96* 100 100  CO2 26 23 24 25   GLUCOSE 115* 113* 125* 104*  BUN 16 17 15 14   CREATININE 0.74 0.61 0.62 0.59  CALCIUM 9.2 8.4* 8.5* 8.2*  MG  --   --  1.8  --    GFR: Estimated Creatinine Clearance: 63.5 mL/min (by C-G formula based on SCr of 0.59 mg/dL). Liver Function Tests: Recent Labs  Lab 05/02/18 1058 05/05/18 1842 05/06/18 0225  AST 24 30 27   ALT 11 23 21   ALKPHOS 36* 25* 25*  BILITOT 0.9 1.5* 1.6*  PROT 5.5* 4.7* 4.8*  ALBUMIN 2.8* 2.3* 2.4*   Recent Labs  Lab 05/05/18 1842  LIPASE 23   No results for input(s): AMMONIA in the last 168 hours. Coagulation Profile: No results for input(s): INR, PROTIME in the last 168 hours. Cardiac Enzymes: No results for input(s): CKTOTAL, CKMB, CKMBINDEX, TROPONINI in the last 168  hours. BNP (last 3 results) No results for input(s): PROBNP in the last 8760 hours. HbA1C: No results for input(s): HGBA1C in the last 72 hours. CBG: No results for input(s): GLUCAP in the last 168 hours. Lipid Profile: No results for input(s): CHOL, HDL, LDLCALC, TRIG, CHOLHDL, LDLDIRECT in the last 72 hours. Thyroid Function Tests: No results for input(s): TSH, T4TOTAL, FREET4, T3FREE, THYROIDAB in the last 72 hours. Anemia Panel: No results for input(s): VITAMINB12, FOLATE, FERRITIN, TIBC, IRON, RETICCTPCT in the last 72 hours. Sepsis Labs: Recent Labs  Lab 05/05/18 2108  LATICACIDVEN 0.73    Recent Results (from the past 240 hour(s))  Culture, Urine     Status: Abnormal   Collection Time: 05/06/18 12:48 AM  Result Value Ref Range Status   Specimen Description   Final    URINE, CLEAN CATCH Performed at [facility], [place] [address] [place], [place] [address]    Special Requests   Final    NONE Performed at [facility] [place] [address], [place], [place] [address]    Culture >=100,000 COLONIES/mL KLEBSIELLA PNEUMONIAE (A)  Final  Report Status 05/08/2018 FINAL  Final   Organism ID, Bacteria KLEBSIELLA PNEUMONIAE (A)  Final      Susceptibility   Klebsiella pneumoniae - MIC*    AMPICILLIN >=32 RESISTANT Resistant     CEFAZOLIN <=4 SENSITIVE Sensitive     CEFTRIAXONE <=1 SENSITIVE Sensitive     CIPROFLOXACIN <=0.25 SENSITIVE Sensitive     GENTAMICIN <=1 SENSITIVE Sensitive     IMIPENEM <=0.25 SENSITIVE Sensitive     NITROFURANTOIN 128 RESISTANT Resistant     TRIMETH/SULFA <=20 SENSITIVE Sensitive     AMPICILLIN/SULBACTAM 8 SENSITIVE Sensitive     PIP/TAZO <=4 SENSITIVE Sensitive     Extended ESBL NEGATIVE Sensitive     * >=100,000 COLONIES/mL KLEBSIELLA PNEUMONIAE  MRSA PCR Screening     Status: None   Collection Time: 05/06/18  1:51 AM  Result Value Ref Range Status   MRSA by PCR NEGATIVE NEGATIVE Final    Comment:        The  GeneXpert MRSA Assay (FDA approved for NASAL specimens only), is one component of a comprehensive MRSA colonization surveillance program. It is not intended to diagnose MRSA infection nor to guide or monitor treatment for MRSA infections. Performed at [facility], [place] [address], [place], [place] [address]   Culture, blood (routine x 2)     Status: None (Preliminary result)   Collection Time: 05/06/18  2:25 AM  Result Value Ref Range Status   Specimen Description   Final    BLOOD LEFT HAND Performed at [facility] [address], [place], [place] [address]    Special Requests   Final    BOTTLES DRAWN AEROBIC AND ANAEROBIC Blood Culture adequate volume Performed at [facility] [address] [place], [place] [address]    Culture   Final    NO GROWTH 2 DAYS Performed at [facility] [address], [place], [place] [address]    Report Status PENDING  Incomplete  Culture, blood (routine x 2)     Status: None (Preliminary result)   Collection Time: 05/06/18  2:25 AM  Result Value Ref Range Status   Specimen Description   Final    BLOOD LEFT ANTECUBITAL Performed at [facility] [address], [place], [place] [address]    Special Requests   Final    BOTTLES DRAWN AEROBIC AND ANAEROBIC Blood Culture adequate volume Performed at [facility] [address], [place], [place] [address]    Culture   Final    NO GROWTH 2 DAYS Performed at [facility] [address], [place], [place] [address]    Report Status PENDING  Incomplete  Body fluid culture     Status: None (Preliminary result)   Collection Time: 05/06/18 10:18 AM  Result Value Ref Range Status   Specimen Description   Final    PERITONEAL CAVITY Performed at [facility] [address], [place], [place] [address]    Special Requests   Final    NONE Performed at [facility], [place] [address], [place], [place] [address]    Gram Stain   Final    MODERATE WBC PRESENT, PREDOMINANTLY PMN NO ORGANISMS SEEN    Culture   Final    NO GROWTH 2 DAYS Performed at [facility] [address], [place],  [address]    Report Status PENDING  Incomplete  Radiology Studies: No results found.      Scheduled Meds: . calcium-vitamin D  1 tablet Oral Q breakfast  . enoxaparin  120 mg Subcutaneous Q24H  . feeding supplement (ENSURE ENLIVE)  237 mL Oral AC breakfast  . folic acid  1 mg Oral Daily  . levETIRAcetam  500 mg Oral BID  . multivitamin with minerals  1 tablet Oral Daily  . neomycin-polymyxin b-dexamethasone  2 drop Both Eyes Q6H  . pantoprazole  40 mg Oral Daily  . polyethylene glycol  17 g Oral Daily  . [START ON 05/09/2018] senna-docusate  2 tablet Oral QHS  . sodium chloride flush  10-40 mL Intracatheter Q12H  . sodium chloride flush  3 mL Intravenous Q12H  . sucralfate  1 g Oral QID   Continuous Infusions: . sodium chloride 20 mL/hr at 05/07/18 1526  . albumin human    . cefTRIAXone (ROCEPHIN)  IV 2 g (05/08/18 1057)     LOS: 1 day    Time spent: 25 mins.More than 50% of that time was spent in counseling and/or coordination of care.      Shelly Coss, MD [facility] Pager [phone]  If 7PM-7AM, please contact night-coverage [email or web address] Password [email or web address] 05/08/2018, 1:00 PM

## 2018-05-08 NOTE — Progress Notes (Signed)
   05/08/18 1754  Vitals  BP 99/63  MAP (mmHg) 74  BP Location Left Arm  BP Method Automatic  Patient Position (if appropriate) Lying  Pulse Rate 88  Resp 16  Oxygen Therapy  SpO2 94 %  O2 Device Room Air   Patient c/o 8/10 Heaviness in upper/mid chest. At first patient stated that it felt like it could be anxiety related and asked for a Xanax. Xanax given and patient laid back down in the bed with the [facility] at 45 degrees. Patient still c/o chest heaviness. RN obtained vital signs as listed above. EKG obtained and placed on chart. Patient also requested Oxycodone for pain at this time. It was given per orders on MAR. Will make MD aware of chest heaviness and vital signs/EKG. Will continue to monitor.   Joann Kulpa, [place] 05/08/2018 6:25 PM

## 2018-05-09 LAB — BASIC METABOLIC PANEL
Anion gap: 9 (ref 5–15)
BUN: 13 mg/dL (ref 8–23)
CALCIUM: 8.3 mg/dL — AB (ref 8.9–10.3)
CO2: 25 mmol/L (ref 22–32)
CREATININE: 0.65 mg/dL (ref 0.44–1.00)
Chloride: 98 mmol/L (ref 98–111)
GFR calc non Af Amer: >60 mL/min (ref >60)
GLUCOSE: 120 mg/dL — AB (ref 70–99)
Potassium: 4.7 mmol/L (ref 3.5–5.1)
Sodium: 132 mmol/L — ABNORMAL LOW (ref 135–145)

## 2018-05-09 LAB — BODY FLUID CULTURE: Culture: NO GROWTH

## 2018-05-09 LAB — TROPONIN I
Troponin I: <0.03 ng/mL (ref <0.03)
Troponin I: <0.03 ng/mL (ref <0.03)

## 2018-05-09 MED ORDER — GUAIFENESIN-DM 100-10 MG/5ML PO SYRP
10.0000 mL | ORAL_SOLUTION | ORAL | Status: DC | PRN
  Administered 2018-05-10: 10 mL via ORAL
  Filled 2018-05-09: qty 10

## 2018-05-09 MED ORDER — PANTOPRAZOLE SODIUM 40 MG PO TBEC
40.0000 mg | DELAYED_RELEASE_TABLET | Freq: Two times a day (BID) | ORAL | Status: DC
  Administered 2018-05-09 – 2018-05-13 (×9): 40 mg via ORAL
  Filled 2018-05-09 (×8): qty 1

## 2018-05-09 NOTE — Progress Notes (Signed)
Physical Therapy in to see Patient. Patient had reported to Physical Therapy c/o chest pain. RN reassessed Pt and there are no acute changes with VS, tele. Or assessment. Pt reports to RN "It feels like more congestion and then pain when I cough." Pt requesting anxiety medication which was given. Will monitor closely and maintain current plan of care  Patient made aware that MD has ordered PRN Cough medicine Pt states "I don't want that right now" RN instructed Pt to call if she would like cough medicine.

## 2018-05-09 NOTE — Progress Notes (Addendum)
PROGRESS NOTE    [facility]olly Black [facility]  FUX:[id number] DOB: 1940/11/07 DOA: 05/05/2018 PCP: [name], MD   Brief Narrative: Patient is a 77 year old female with past medical history of hypertension, breast cancer, stage IV non-small cell lung cancer on chemotherapy who presented to the emergency department for the evaluation of abdominal pain and distention.  CT imaging done on this admission showed new hepatic masses, moderate ascites, involvement of the small bowel and omentum.  Underwent paracentesis and the fluid analysis is suggestive of spontaneous bacterial peritonitis.  Started on antibiotics.  Assessment & Plan:   Principal Problem:   SBP (spontaneous bacterial peritonitis) ([place]) Active Problems:   Adenocarcinoma of right lung, stage 4 (HCC)   OSA on CPAP   Convulsions/seizures (HCC)   Brain metastases (HCC)   Hypokalemia   Antineoplastic chemotherapy induced anemia   Personal history of DVT (deep vein thrombosis)   SIRS (systemic inflammatory response syndrome) (HCC)   Hyponatremia   Liver lesion   Ascites   Diarrhea with dehydration   Abdominal pain  SBP: Ascitic fluid analysis suggestive of SBP.  We will follow-up ascitic fluid culture.  NGTD.Started on ceftriaxone.  Plan is to continue IV antibiotics until culture and sensitivity of the fluid analysis come back.  Abdominal pain: This is secondary to progressive metastatic disease ,SBPand moderate ascites as seen on the scan.  Patient's abdomen pain has already improved after paracentesis.  She underwent paracentesis on 05/06/18 with removal of 1.9 L of fluid .We will also check fluid cytology which is pending.  Metastatic non-small cell lung cancer: Underwent chemotherapy on 05/02/2018. Follows with oncology,Dr Julien Nordmann , for treatment of Stage IV NSCLC with adenopathy, adrenal met, and brain mets  Currently receiving Alimta every 3 weeks, last infusion on 10/7 - As noted above, ED workup reveals numerous new liver  masses, masslike thickening of proximal jejunum, and omental implants with ascites, all concerning for metastatic disease. I have discussed with Dr. Julien Nordmann  about the findings of the CT scan.  She will be seen at his office as an outpatient. CEA normal but elevated CA 125 .  We will follow cytology report from paracentesis.\ After discussion with her family this morning,they have requested to discuss with Dr. Julien Nordmann.  I called him again this morning and he says he will be there tomorrow to see the patient. We have also consulted palliative care for goals of care.  Hypokalemia: Potassium supplemented.  Hypertension: Currently blood pressure is soft.  Losartan hydrochlorthiazide on hold due to low blood pressure .  Normocytic anemia: Currently H&H is stable  Seizure disorder: Continue Keppra  History of DVT: Continue Lovenox at 1.5 mg/kg q24h  Constipation: Bowel regimen started.  Chest pain: Resolved this morning.  Was complaining of chest pain and pain between the shoulder blades.  MRI of the thoracic spine did not show any definite areas of osseous metastatic disease.  UTI: Urine culture grew Klebsiella pneumonia.  Already on ceftriaxone    DVT prophylaxis: Lovenox Code Status: Full Family Communication: Family members present at the bed side Disposition Plan: Home after final culture and sensitivity report of ascitic fluid, palliative care evaluation and discussion with Dr. Julien Nordmann   Consultants: None  Procedures:Paracentesis  Antimicrobials:Ceftraixone  Subjective: Patient seen and examined the bedside this morning.  Feels better today.  Abdominal pain has improved.  No Nausea, vomiting, fever or chills.  She has been voiding fine now.  She had a small bowel movement this morning.  She really wants to  speak to Dr. Julien Nordmann  objective: Vitals:   05/08/18 1905 05/08/18 2152 05/09/18 0024 05/09/18 0625  BP: 103/77 109/74  93/70  Pulse: 85 90 68 95  Resp: (!) 24 (!) 28 (!)  24 20  Temp: 99.6 F (37.6 C) 98.4 F (36.9 C)  97.9 F (36.6 C)  TempSrc: Oral Oral  Oral  SpO2: 94% 92% 96% 93%  Weight:      Height:        Intake/Output Summary (Last 24 hours) at 05/09/2018 1053 Last data filed at 05/09/2018 0700 Gross per 24 hour  Intake 314.64 ml  Output 500 ml  Net -185.36 ml   Filed Weights   05/06/18 0439 05/07/18 0500 05/08/18 0555  Weight: 87.5 kg 86.6 kg 87.9 kg    Examination:  General exam: Appears calm and comfortable ,Not in distress,obese HEENT:PERRL,Oral mucosa moist, Ear/Nose normal on gross exam Respiratory system: Mostly clear with mild bibasilar crackles Cardiovascular system: S1 & S2 heard, RRR. No JVD, murmurs, rubs, gallops or clicks.  Trace pedal edema. Gastrointestinal system: Abdomen is distended, soft and has mild generalized tenderness. No organomegaly or masses felt. Normal bowel sounds heard. Central nervous system: Alert and oriented. No focal neurological deficits. Extremities: Trace edema, no clubbing ,no cyanosis, distal peripheral pulses palpable. Skin: No rashes, lesions or ulcers,no icterus ,no pallor MSK: Normal muscle bulk,tone ,power Psychiatry: Judgement and insight appear normal. Mood & affect appropriate.     Data Reviewed: I have personally reviewed following labs and imaging studies  CBC: Recent Labs  Lab 05/02/18 1058 05/05/18 1842 05/06/18 0225  WBC 6.6 11.2* 10.4  NEUTROABS 6.1  --  9.9*  HGB 11.3* 10.4* 10.5*  HCT 32.6* 31.0* 31.6*  MCV 92.3 92.0 94.0  PLT 232 214 749   Basic Metabolic Panel: Recent Labs  Lab 05/02/18 1058 05/05/18 1842 05/06/18 0225 05/07/18 0618 05/09/18 0203  NA 135 129* 131* 132* 132*  K 4.0 3.2* 3.4* 4.0 4.7  CL 101 96* 100 100 98  CO2 _0 GLUCOSE 115* 113* 125* 104* 120*  BUN _1 CREATININE 0.74 0.61 0.62 0.59 0.65  CALCIUM 9.2 8.4* 8.5* 8.2* 8.3*  MG  --   --  1.8  --   --    GFR: Estimated Creatinine Clearance: 63.5 mL/min (by  C-G formula based on SCr of 0.65 mg/dL). Liver Function Tests: Recent Labs  Lab 05/02/18 1058 05/05/18 1842 05/06/18 0225  AST _2 ALT _3 ALKPHOS 36* 25* 25*  BILITOT 0.9 1.5* 1.6*  PROT 5.5* 4.7* 4.8*  ALBUMIN 2.8* 2.3* 2.4*   Recent Labs  Lab 05/05/18 1842  LIPASE 23   No results for input(s): AMMONIA in the last 168 hours. Coagulation Profile: No results for input(s): INR, PROTIME in the last 168 hours. Cardiac Enzymes: Recent Labs  Lab 05/08/18 2000 05/09/18 0203 05/09/18 0805  TROPONINI <0.03 <0.03 <0.03   BNP (last 3 results) No results for input(s): PROBNP in the last 8760 hours. HbA1C: No results for input(s): HGBA1C in the last 72 hours. CBG: No results for input(s): GLUCAP in the last 168 hours. Lipid Profile: No results for input(s): CHOL, HDL, LDLCALC, TRIG, CHOLHDL, LDLDIRECT in the last 72 hours. Thyroid Function Tests: No results for input(s): TSH, T4TOTAL, FREET4, T3FREE, THYROIDAB in the last 72 hours. Anemia Panel: No results for input(s): VITAMINB12, FOLATE, FERRITIN, TIBC, IRON, RETICCTPCT in the last 72 hours. Sepsis Labs: Recent Labs  Lab 05/05/18 2108  LATICACIDVEN 0.73    Recent Results (from the past 240 hour(s))  Culture, Urine     Status: Abnormal   Collection Time: 05/06/18 12:48 AM  Result Value Ref Range Status   Specimen Description   Final    URINE, CLEAN CATCH Performed at [facility], [place] [address], [place], [place] [address]    Special Requests   Final    NONE Performed at [facility], [place] [address], [place], [place] [address]    Culture >=100,000 COLONIES/mL KLEBSIELLA PNEUMONIAE (A)  Final   Report Status 05/08/2018 FINAL  Final   Organism ID, Bacteria KLEBSIELLA PNEUMONIAE (A)  Final      Susceptibility   Klebsiella pneumoniae - MIC*    AMPICILLIN >=32 RESISTANT Resistant     CEFAZOLIN <=4 SENSITIVE Sensitive     CEFTRIAXONE <=1 SENSITIVE Sensitive      CIPROFLOXACIN <=0.25 SENSITIVE Sensitive     GENTAMICIN <=1 SENSITIVE Sensitive     IMIPENEM <=0.25 SENSITIVE Sensitive     NITROFURANTOIN 128 RESISTANT Resistant     TRIMETH/SULFA <=20 SENSITIVE Sensitive     AMPICILLIN/SULBACTAM 8 SENSITIVE Sensitive     PIP/TAZO <=4 SENSITIVE Sensitive     Extended ESBL NEGATIVE Sensitive     * >=100,000 COLONIES/mL KLEBSIELLA PNEUMONIAE  MRSA PCR Screening     Status: None   Collection Time: 05/06/18  1:51 AM  Result Value Ref Range Status   MRSA by PCR NEGATIVE NEGATIVE Final    Comment:        The GeneXpert MRSA Assay (FDA approved for NASAL specimens only), is one component of a comprehensive MRSA colonization surveillance program. It is not intended to diagnose MRSA infection nor to guide or monitor treatment for MRSA infections. Performed at [facility], [place] [address], [place], [place] [address]   Culture, blood (routine x 2)     Status: None (Preliminary result)   Collection Time: 05/06/18  2:25 AM  Result Value Ref Range Status   Specimen Description   Final    BLOOD LEFT HAND Performed at [facility] [address], [place], [place] [address]    Special Requests   Final    BOTTLES DRAWN AEROBIC AND ANAEROBIC Blood Culture adequate volume Performed at [facility] [address] [place], [place] [address]    Culture   Final    NO GROWTH 2 DAYS Performed at [facility] [address], [place], [place] [address]    Report Status PENDING  Incomplete  Culture, blood (routine x 2)     Status: None (Preliminary result)   Collection Time: 05/06/18  2:25 AM  Result Value Ref Range Status   Specimen Description   Final    BLOOD LEFT ANTECUBITAL Performed at [facility] [address] [place], [place] [address]    Special Requests   Final    BOTTLES DRAWN AEROBIC AND ANAEROBIC Blood Culture adequate volume Performed at [facility] [address] [place], [place] [address]    Culture   Final    NO GROWTH 2 DAYS Performed at [facility] [address], [place],  [address]    Report Status PENDING  Incomplete  Body fluid culture     Status: None   Collection Time: 05/06/18 10:18 AM  Result Value Ref Range Status   Specimen Description   Final    PERITONEAL  CAVITY Performed at [facility], [place] [address] [place], [place] [address]    Special Requests   Final    NONE Performed at [facility], [place] [address], [place], [place] [address]    Gram Stain   Final    MODERATE WBC PRESENT, PREDOMINANTLY PMN NO ORGANISMS SEEN    Culture   Final    NO GROWTH 3 DAYS Performed at [facility] [address], [place],  [address]    Report Status 05/09/2018 FINAL  Final         Radiology Studies: Mr Thoracic Spine W Wo Contrast  Result Date: 05/08/2018 CLINICAL DATA:  Back pain.  Metastatic lung cancer. EXAM: MRI THORACIC WITHOUT AND WITH CONTRAST TECHNIQUE: Multiplanar and multiecho pulse sequences of the thoracic spine were obtained without and with intravenous contrast. CONTRAST:  Gadavist 8.5 mL COMPARISON:  Chest CT 05/05/2018 FINDINGS: The patient was unable to remain motionless for the exam. Small or subtle lesions could be overlooked. MRI THORACIC SPINE FINDINGS Alignment:  Physiologic. Vertebrae: No fracture, evidence of discitis, or bone lesion. Scattered hemangiomata. Post infusion, no abnormal vertebral body is enhancement. Cord: Essentially normal signal and morphology. Slight deformity related to disc protrusion described below, with no abnormal signal. Paraspinal and other soft tissues: BILATERAL effusions. Ascites. Hepatic tumor and omental lesions poorly visualized. Disc levels: No compressive disc herniation except for T5-6 on the LEFT, where there is mild mass effect on the subarachnoid space and slight LEFT-sided cord  flattening. No significant spinal stenosis. No abnormal cord signal. IMPRESSION: Suboptimal, motion degraded MRI of the thoracic spine without and with contrast demonstrates no definite areas of osseous metastatic disease. Slight cord deformity related to calcified T5-6 protrusion on the LEFT, but no stenosis or abnormal cord signal. No abnormal enhancement of the cord or intraspinal contents. Additional extra-spinal findings better detailed on CT chest abdomen pelvis, including BILATERAL pleural effusions and ascites, as well as visceral disease. Electronically Signed   By: Staci Righter M.D.   On: 05/08/2018 21:51   Dg Abd Acute W/chest  Result Date: 05/08/2018 CLINICAL DATA:  Patient with abdominal distension. EXAM: DG ABDOMEN ACUTE W/ 1V CHEST COMPARISON:  Abdominal CT 05/05/2018 FINDINGS: Left anterior chest wall Port-A-Cath is present with tip projecting over the superior vena cava. The stable cardiac and mediastinal contours. Low lung volumes. Bibasilar atelectasis. Gas is demonstrated within nondilated loops of large and small bowel in a nonobstructed pattern. Large amount of stool throughout the colon. Decubitus view demonstrates no free intraperitoneal air. Cholecystectomy clips. IMPRESSION: Large amount of stool throughout the colon as can be seen with constipation. Nonobstructed bowel gas pattern. Low lung volumes with basilar atelectasis. Cardiomegaly. Electronically Signed   By: Lovey Newcomer M.D.   On: 05/08/2018 21:10        Scheduled Meds: . calcium-vitamin D  1 tablet Oral Q breakfast  . enoxaparin  120 mg Subcutaneous Q24H  . feeding supplement (ENSURE ENLIVE)  237 mL Oral AC breakfast  . folic acid  1 mg Oral Daily  . levETIRAcetam  500 mg Oral BID  . multivitamin with minerals  1 tablet Oral Daily  . neomycin-polymyxin b-dexamethasone  2 drop Both Eyes Q6H  . pantoprazole  40 mg Oral Daily  . polyethylene glycol  17 g Oral Daily  . senna-docusate  2 tablet Oral QHS  . sodium  chloride flush  10-40 mL Intracatheter Q12H  . sodium chloride flush  3 mL Intravenous Q12H  .  sucralfate  1 g Oral QID   Continuous Infusions: . sodium chloride 10 mL/hr at 05/08/18 1832  . albumin human    . cefTRIAXone (ROCEPHIN)  IV 2 g (05/09/18 0942)     LOS: 2 days    Time spent: 25 mins.More than 50% of that time was spent in counseling and/or coordination of care.      Shelly Coss, MD [facility] Pager [phone]  If 7PM-7AM, please contact night-coverage [email or web address] Password [email or web address] 05/09/2018, 10:53 AM
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
## 2018-05-09 NOTE — Progress Notes (Signed)
Pt given Soap Suds Enema early this am. Pt assisted up to [facility] and water expelled along with scant amount of BM.

## 2018-05-09 NOTE — Progress Notes (Signed)
Palliative care progress note  I checked in with Ms. Molly Black today.  Denies needs and reports that she is hopeful to discuss with Dr. Julien Nordmann tomorrow and is not interested in further discussion with our team until he has weighed in.  Molly Rough, MD [place] Palliative Medicine Team [phone]  NO CHARGE NOTE

## 2018-05-09 NOTE — Progress Notes (Signed)
PT Cancellation Note  Patient Details Name: Molly Black MRN: [id number] DOB: 08-19-1940   Cancelled Treatment:    Reason Eval/Treat Not Completed: Pain limiting ability to participate Pt sitting upright and requesting assist back to bed.  Pt declined to participate, reports she 'sat up too long'.  Pt having chest pain and reports from coughing.  Pt requesting pain meds, RN informed of pain and pain meds request.   Yamilex Borgwardt,KATHrine E 05/09/2018, 11:11 AM Carmelia Bake, PT, DPT Acute Rehabilitation Services Office: [phone] Pager: [phone]

## 2018-05-09 NOTE — Progress Notes (Signed)
Pt resting quietly. Denies chest pain or other needs. Will continue to monitor and maintain plan of care.

## 2018-05-10 ENCOUNTER — Inpatient Hospital Stay (HOSPITAL_COMMUNITY): Payer: Medicare Other

## 2018-05-10 DIAGNOSIS — K652 Spontaneous bacterial peritonitis: Principal | ICD-10-CM

## 2018-05-10 DIAGNOSIS — C7492 Malignant neoplasm of unspecified part of left adrenal gland: Secondary | ICD-10-CM

## 2018-05-10 DIAGNOSIS — Z923 Personal history of irradiation: Secondary | ICD-10-CM

## 2018-05-10 DIAGNOSIS — R188 Other ascites: Secondary | ICD-10-CM

## 2018-05-10 DIAGNOSIS — C3490 Malignant neoplasm of unspecified part of unspecified bronchus or lung: Secondary | ICD-10-CM

## 2018-05-10 DIAGNOSIS — C7931 Secondary malignant neoplasm of brain: Secondary | ICD-10-CM

## 2018-05-10 LAB — BASIC METABOLIC PANEL
Anion gap: 8 (ref 5–15)
BUN: 11 mg/dL (ref 8–23)
CALCIUM: 8.1 mg/dL — AB (ref 8.9–10.3)
CO2: 26 mmol/L (ref 22–32)
Chloride: 93 mmol/L — ABNORMAL LOW (ref 98–111)
Creatinine, Ser: 0.6 mg/dL (ref 0.44–1.00)
GFR calc Af Amer: >60 mL/min (ref >60)
GLUCOSE: 133 mg/dL — AB (ref 70–99)
Potassium: 4 mmol/L (ref 3.5–5.1)
Sodium: 127 mmol/L — ABNORMAL LOW (ref 135–145)

## 2018-05-10 LAB — MAGNESIUM: Magnesium: 1.7 mg/dL (ref 1.7–2.4)

## 2018-05-10 MED ORDER — MINERAL OIL RE ENEM
1.0000 | ENEMA | Freq: Once | RECTAL | Status: AC
  Administered 2018-05-10: 1 via RECTAL
  Filled 2018-05-10 (×2): qty 1

## 2018-05-10 MED ORDER — BISACODYL 10 MG RE SUPP: 10.0000 mg | Freq: Once | RECTAL | Status: DC

## 2018-05-10 MED ORDER — FUROSEMIDE 40 MG PO TABS
40.0000 mg | ORAL_TABLET | Freq: Every day | ORAL | Status: DC
  Administered 2018-05-11: 40 mg via ORAL
  Filled 2018-05-10: qty 1

## 2018-05-10 MED ORDER — GUAIFENESIN ER 600 MG PO TB12
1200.0000 mg | ORAL_TABLET | Freq: Two times a day (BID) | ORAL | Status: DC
  Administered 2018-05-10 – 2018-05-13 (×7): 1200 mg via ORAL
  Filled 2018-05-10 (×7): qty 2

## 2018-05-10 MED ORDER — FUROSEMIDE 10 MG/ML IJ SOLN
40.0000 mg | Freq: Four times a day (QID) | INTRAMUSCULAR | Status: AC
  Administered 2018-05-10 (×2): 40 mg via INTRAVENOUS
  Filled 2018-05-10 (×2): qty 4

## 2018-05-10 NOTE — Progress Notes (Signed)
Patient with malignant ascites s/p diagnostic and therapeutic paracentesis yielding 1.9 L of fluid. Request for therapeutic paracentesis today due to distention noted on exam. Patient states she is not experiencing pain nor does she feel very distended.   Limited abdominal US performed today which shows small amount of ascites in left upper quadrant. Discussed with patients risks vs benefits of proceeding with therapeutic paracentesis today given she is essentially asymptomatic and recently underwent diagnostic paracentesis. After discussion patient has elected to postpone therapeutic paracentesis until she again becomes symptomatic. All questions answered, patient returned to floor.  Please call IR with questions or concerns.  Candiss Norse, PA-C 05/10/18 1620

## 2018-05-10 NOTE — Progress Notes (Signed)
MD updated via phone. Pt on telemetry with 2 runs of SVT vs rapid A fib with rates of 180-185. At this time rate has dropped to mid 80s and NSR. VSS Blood Pressure 10/71. No other acute changes noted with Pt's assessment. VS charted. Pt is sitting up in a chair and denies needs at this time. Monitor closely and maintain current plan of care for patient
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
## 2018-05-10 NOTE — Care Management Note (Signed)
Case Management Note  Patient Details  Name: HONESTEE REVARD MRN: [id number] Date of Birth: [birth year]  Subjective/Objective: PT recc HHPT;provided w/HHC agency list-await choice.                    Action/Plan:dc home w/HHC.   Expected Discharge Date:  (unknown)               Expected Discharge Plan:     In-House Referral:     Discharge planning Services  CM Consult  Post Acute Care Choice:  Durable Medical Equipment(rw,cpap) Choice offered to:  Patient  DME Arranged:    DME Agency:     HH Arranged:    [place] Agency:     Status of Service:  In process, will continue to follow  If discussed at Long Length of Stay Meetings, dates discussed:    Additional Comments:  Dessa Phi, RN 05/10/2018, 3:25 PM

## 2018-05-10 NOTE — Progress Notes (Signed)
Mineral Oil Enema given this afternoon. Pt has expelled the liquid from the enema No stool noted at this time

## 2018-05-10 NOTE — Progress Notes (Signed)
Subjective: The patient seen and examined today.  Several family members including her sister-in-law and 2 nieces were at the bedside.  She was admitted with shortness of breath as well as abdominal distention.  She continues to have abdominal distention secondary to reaccumulation of ascites.  She denied having any nausea or vomiting.  She denied having any fever or chills.  She has no chest pain but continues to have shortness of breath.  Objective: Vital signs in last 24 hours: Temp:  [97.6 F (36.4 C)-98.3 F (36.8 C)] 98.2 F (36.8 C) (10/15 1430) Pulse Rate:  [80-85] 85 (10/15 0506) Resp:  [18-22] 20 (10/15 1430) BP: (99-105)/(66-71) 105/66 (10/15 1430) SpO2:  [93 %-94 %] 93 % (10/15 1430) Weight:  [199 lb 6.4 oz (90.4 kg)] 199 lb 6.4 oz (90.4 kg) (10/15 0506)  Intake/Output from previous day: 10/14 0701 - 10/15 0700 In: 794.6 [P.O.:360; I.V.:234.6; IV Piggyback:200] Out: 200 [Urine:200] Intake/Output this shift: Total I/O In: -  Out: 400 [Urine:400]  General appearance: alert, cooperative, fatigued and mild distress Resp: clear to auscultation bilaterally Cardio: regular rate and rhythm, S1, S2 normal, no murmur, click, rub or gallop GI: Abdominal distention Extremities: extremities normal, atraumatic, no cyanosis or edema  Lab Results:  No results for input(s): WBC, HGB, HCT, PLT in the last 72 hours. BMET Recent Labs    05/09/18 0203  NA 132*  K 4.7  CL 98  CO2 25  GLUCOSE 120*  BUN 13  CREATININE 0.65  CALCIUM 8.3*    Studies/Results: Mr Thoracic Spine W Wo Contrast  Result Date: 05/08/2018 CLINICAL DATA:  Back pain.  Metastatic lung cancer. EXAM: MRI THORACIC WITHOUT AND WITH CONTRAST TECHNIQUE: Multiplanar and multiecho pulse sequences of the thoracic spine were obtained without and with intravenous contrast. CONTRAST:  Gadavist 8.5 mL COMPARISON:  Chest CT 05/05/2018 FINDINGS: The patient was unable to remain motionless for the exam. Small or subtle  lesions could be overlooked. MRI THORACIC SPINE FINDINGS Alignment:  Physiologic. Vertebrae: No fracture, evidence of discitis, or bone lesion. Scattered hemangiomata. Post infusion, no abnormal vertebral body is enhancement. Cord: Essentially normal signal and morphology. Slight deformity related to disc protrusion described below, with no abnormal signal. Paraspinal and other soft tissues: BILATERAL effusions. Ascites. Hepatic tumor and omental lesions poorly visualized. Disc levels: No compressive disc herniation except for T5-6 on the LEFT, where there is mild mass effect on the subarachnoid space and slight LEFT-sided cord flattening. No significant spinal stenosis. No abnormal cord signal. IMPRESSION: Suboptimal, motion degraded MRI of the thoracic spine without and with contrast demonstrates no definite areas of osseous metastatic disease. Slight cord deformity related to calcified T5-6 protrusion on the LEFT, but no stenosis or abnormal cord signal. No abnormal enhancement of the cord or intraspinal contents. Additional extra-spinal findings better detailed on CT chest abdomen pelvis, including BILATERAL pleural effusions and ascites, as well as visceral disease. Electronically Signed   By: Staci Righter M.D.   On: 05/08/2018 21:51   Dg Abd Acute W/chest  Result Date: 05/08/2018 CLINICAL DATA:  Patient with abdominal distension. EXAM: DG ABDOMEN ACUTE W/ 1V CHEST COMPARISON:  Abdominal CT 05/05/2018 FINDINGS: Left anterior chest wall Port-A-Cath is present with tip projecting over the superior vena cava. The stable cardiac and mediastinal contours. Low lung volumes. Bibasilar atelectasis. Gas is demonstrated within nondilated loops of large and small bowel in a nonobstructed pattern. Large amount of stool throughout the colon. Decubitus view demonstrates no free intraperitoneal air. Cholecystectomy clips. IMPRESSION:  Large amount of stool throughout the colon as can be seen with constipation.  Nonobstructed bowel gas pattern. Low lung volumes with basilar atelectasis. Cardiomegaly. Electronically Signed   By: Lovey Newcomer M.D.   On: 05/08/2018 21:10    Medications: I have reviewed the patient's current medications.  Assessment/Plan: This is a very pleasant 77 years old white female with stage IV non-small cell lung cancer diagnosed in October 2017.  She is status post several regimens of treatment including a course of concurrent chemoradiation as well as a stereotactic radiotherapy to left adrenal gland as well as metastatic brain lesions.  The patient was also started on systemic chemotherapy with carboplatin, Alimta and Avastin for 4 cycles followed by maintenance treatment with single agent Ketruda (pembrolizumab) status post 24 cycles.  She has been tolerating this treatment well with no concerning adverse effect. During his current admission imaging studies showed numerous new hepatic masses compatible with metastatic disease in addition to a new omental tumor implants in the left upper quadrant along with moderate ascites.  She underwent ultrasound guided paracentesis twice during this admission and the cytology was consistent with metastatic adenocarcinoma. I had a lengthy discussion with the patient and her family today about her current condition and treatment options. I discussed with the patient the option of palliative care and hospice referral versus consideration of palliative systemic chemotherapy with docetaxel and Cyramza versus consideration of clinical trial with Keytruda and Cyramza if she is eligible. The family were pushing for palliative care and hospice but the patient would like to consider further treatment. For the recurrent ascites, this is a sign of poor prognosis.  We will continue with the paracentesis on as-needed basis. For discharge planning, the patient may benefit from discharge to a skilled nursing facility until improvement of her general condition.  Her  family indicated that they would not be able to take care of her at home with her current condition. The patient will have a follow-up appointment with me at the cancer center after discharge for more detailed discussion of her treatment options based on her performance status. Thank you for taking good care of Ms. Glenaire, please call if you have any questions.   LOS: 3 days    Eilleen Kempf 05/10/2018

## 2018-05-10 NOTE — Care Management Important Message (Signed)
Important Message  Patient Details  Name: Molly Black MRN: [id number] Date of Birth: [birth year]   Medicare Important Message Given:  Yes    Kerin Salen 05/10/2018, 11:12 AMImportant Message  Patient Details  Name: Molly Black MRN: [id number] Date of Birth: [birth year]   Medicare Important Message Given:  Yes    Kerin Salen 05/10/2018, 11:12 AM

## 2018-05-10 NOTE — Progress Notes (Addendum)
PROGRESS NOTE    Molly Black [facility]  GGE:[id number] DOB: [birth year] DOA: 05/05/2018 PCP: [name], MD   Brief Narrative: Patient is a 77 year old female with past medical history of hypertension, breast cancer, stage IV non-small cell lung cancer on chemotherapy who presented to the emergency department for the evaluation of abdominal pain and distention.  CT imaging done on this admission showed new hepatic masses, moderate ascites, involvement of the small bowel and omentum suggesting possible progression of her metastatic lung cancer.  Underwent paracentesis and the fluid analysis is suggestive of spontaneous bacterial peritonitis.  Started on antibiotics.  Assessment & Plan:   Principal Problem:   SBP (spontaneous bacterial peritonitis) ([place]) Active Problems:   Adenocarcinoma of right lung, stage 4 (HCC)   OSA on CPAP   Convulsions/seizures (HCC)   Brain metastases (HCC)   Hypokalemia   Antineoplastic chemotherapy induced anemia   Personal history of DVT (deep vein thrombosis)   SIRS (systemic inflammatory response syndrome) (HCC)   Hyponatremia   Liver lesion   Ascites   Diarrhea with dehydration   Abdominal pain  SBP: Ascitic fluid analysis suggestive of SBP.  We will follow-up ascitic fluid culture.  NGTD.Started on ceftriaxone.  Plan is to continue IV antibiotics till tomorrow to complete 5 days course.  Abdominal pain/Ascitis: This is secondary to progressive metastatic disease ,SBP and moderate ascites as seen on the scan.  Patient's abdomen pain has  improved after last paracentesis.  She underwent paracentesis on 05/06/18 with removal of 1.9 L of fluid . Abdomen again found to be distended today.  Order for repeat paracentesis today.  Metastatic non-small cell lung cancer: Underwent chemotherapy on 05/02/2018. Follows with oncology,Dr Julien Nordmann , for treatment of Stage IV NSCLC with adenopathy, adrenal met, and brain mets  Currently receiving Alimta every 3 weeks, last  infusion on 10/7.Imaging done here  reveals numerous new liver masses, masslike thickening of proximal jejunum, and omental implants with ascites, all concerning for metastatic disease. CEA normal but elevated CA 125 .  Cytology report from paracentesis showed MALIGNANT CELLS CONSISTENT WITH METASTATIC ADENOCARCINOMA suggesting progression of metastatic lung cancer.  Dr. Julien Nordmann discussed with patient and family.At this point,patient wants to continue chemotherapy and does not want to give up. We have also consulted palliative care for goals of care. I have discussed extensively with the family that she might be a candidate for hospice and the further treatment plan can be futile.  Patient is still on denial and does not want to stop her treatment for cancer.  Hypokalemia/Hyponatremia: Potassium supplemented.Lab this afternoon showed hyponatremia. Most likely hypervolemic hyponatremia.Will check level tomorrow.  Hypertension: Currently blood pressure is soft.  Losartan hydrochlorthiazide on hold due to low blood pressure .  Normocytic anemia: Currently H&H is stable  Seizure disorder: Continue Keppra  History of DVT: Continue Lovenox at 1.5 mg/kg q24h  Constipation: Continue Bowel regimen .Will give her a mineral oil enema.  Chest pain: Resolved .  Was complaining of chest pain and pain between the shoulder blades few days ago.  MRI of the thoracic spine did not show any definite areas of osseous metastatic disease.  UTI: Urine culture grew Klebsiella pneumonia.  Already on ceftriaxone and will complete course tomorrow.  Bilateral severe lower extremity edema: Started on Lasix IV for 2 doses.  Continue oral Lasix from tomorrow.Echo on 12/09/2017 showed normal ejection fraction, grade 1 diastolic dysfunction.  SVT ?: Reported that she had few runs of narrow complex tachycardia.  Will check electrolytes.  Disposition: Difficult situation.  Patient eval by PT and recommended home health PT  versus skilled nursing facility.  Since patient lives by herself, skilled nursing facility is the most appropriate choice.  But in the meantime, patient continues to deteriorate , she wished to conitnue chemotherapy and not willing to give up.  Might need to monitor for 1-2 days more until discharge can be safely planned. Still remains full code.  DVT prophylaxis: Lovenox Code Status: Full Family Communication: Family members present at the bed side Disposition Plan: As above   Consultants: None  Procedures:Paracentesis  Antimicrobials:Ceftraixone  Subjective: Patient seen and examined the bedside this morning.  Complained of abdominal pain again this morning.  Cough is also bothering her.  She developed worsening of lower extremity edema today.  objective: Vitals:   05/09/18 2035 05/09/18 2231 05/10/18 0506 05/10/18 1224  BP: 101/68  99/67 100/71  Pulse: 80  85   Resp: _0 (!) 22  Temp: 98.2 F (36.8 C)  98.3 F (36.8 C) 97.6 F (36.4 C)  TempSrc: Oral  Oral Oral  SpO2: 94%  93% 94%  Weight:   90.4 kg   Height:        Intake/Output Summary (Last 24 hours) at 05/10/2018 1230 Last data filed at 05/10/2018 7867 Gross per 24 hour  Intake 407.87 ml  Output 400 ml  Net 7.87 ml   Filed Weights   05/07/18 0500 05/08/18 0555 05/10/18 0506  Weight: 86.6 kg 87.9 kg 90.4 kg    Examination:  General exam: Weak,Not in obvious distress,Obese HEENT:PERRL,Oral mucosa moist, Ear/Nose normal on gross exam Respiratory system: Mostly clear with mild bibasilar crackles Cardiovascular system: S1 & S2 heard, RRR. No JVD, murmurs, rubs, gallops or clicks.  2-3 + pedal edema. Gastrointestinal system: Abdomen is distended, soft and has mild generalized tenderness. No organomegaly or masses felt. Normal bowel sounds heard. Central nervous system: Alert and oriented. No focal neurological deficits. Extremities: 2-3 +_ edema, no clubbing ,no cyanosis, distal peripheral pulses  palpable. Skin: No rashes, lesions or ulcers,no icterus ,no pallor  Data Reviewed: I have personally reviewed following labs and imaging studies  CBC: Recent Labs  Lab 05/05/18 1842 05/06/18 0225  WBC 11.2* 10.4  NEUTROABS  --  9.9*  HGB 10.4* 10.5*  HCT 31.0* 31.6*  MCV 92.0 94.0  PLT 214 544   Basic Metabolic Panel: Recent Labs  Lab 05/05/18 1842 05/06/18 0225 05/07/18 0618 05/09/18 0203  NA 129* 131* 132* 132*  K 3.2* 3.4* 4.0 4.7  CL 96* 100 100 98  CO2 _1 GLUCOSE 113* 125* 104* 120*  BUN _2 CREATININE 0.61 0.62 0.59 0.65  CALCIUM 8.4* 8.5* 8.2* 8.3*  MG  --  1.8  --   --    GFR: Estimated Creatinine Clearance: 64.5 mL/min (by C-G formula based on SCr of 0.65 mg/dL). Liver Function Tests: Recent Labs  Lab 05/05/18 1842 05/06/18 0225  AST 30 27  ALT 23 21  ALKPHOS 25* 25*  BILITOT 1.5* 1.6*  PROT 4.7* 4.8*  ALBUMIN 2.3* 2.4*   Recent Labs  Lab 05/05/18 1842  LIPASE 23   No results for input(s): AMMONIA in the last 168 hours. Coagulation Profile: No results for input(s): INR, PROTIME in the last 168 hours. Cardiac Enzymes: Recent Labs  Lab 05/08/18 2000 05/09/18 0203 05/09/18 0805  TROPONINI <0.03 <0.03 <0.03   BNP (last 3 results) No results for input(s): PROBNP in the last  8760 hours. HbA1C: No results for input(s): HGBA1C in the last 72 hours. CBG: No results for input(s): GLUCAP in the last 168 hours. Lipid Profile: No results for input(s): CHOL, HDL, LDLCALC, TRIG, CHOLHDL, LDLDIRECT in the last 72 hours. Thyroid Function Tests: No results for input(s): TSH, T4TOTAL, FREET4, T3FREE, THYROIDAB in the last 72 hours. Anemia Panel: No results for input(s): VITAMINB12, FOLATE, FERRITIN, TIBC, IRON, RETICCTPCT in the last 72 hours. Sepsis Labs: Recent Labs  Lab 05/05/18 2108  LATICACIDVEN 0.73    Recent Results (from the past 240 hour(s))  Culture, Urine     Status: Abnormal   Collection Time: 05/06/18 12:48 AM   Result Value Ref Range Status   Specimen Description   Final    URINE, CLEAN CATCH Performed at [facility], [place] [address] [place], [place] [address]    Special Requests   Final    NONE Performed at [facility], [place] [address], [place], [place] [address]    Culture >=100,000 COLONIES/mL KLEBSIELLA PNEUMONIAE (A)  Final   Report Status 05/08/2018 FINAL  Final   Organism ID, Bacteria KLEBSIELLA PNEUMONIAE (A)  Final      Susceptibility   Klebsiella pneumoniae - MIC*    AMPICILLIN >=32 RESISTANT Resistant     CEFAZOLIN <=4 SENSITIVE Sensitive     CEFTRIAXONE <=1 SENSITIVE Sensitive     CIPROFLOXACIN <=0.25 SENSITIVE Sensitive     GENTAMICIN <=1 SENSITIVE Sensitive     IMIPENEM <=0.25 SENSITIVE Sensitive     NITROFURANTOIN 128 RESISTANT Resistant     TRIMETH/SULFA <=20 SENSITIVE Sensitive     AMPICILLIN/SULBACTAM 8 SENSITIVE Sensitive     PIP/TAZO <=4 SENSITIVE Sensitive     Extended ESBL NEGATIVE Sensitive     * >=100,000 COLONIES/mL KLEBSIELLA PNEUMONIAE  MRSA PCR Screening     Status: None   Collection Time: 05/06/18  1:51 AM  Result Value Ref Range Status   MRSA by PCR NEGATIVE NEGATIVE Final    Comment:        The GeneXpert MRSA Assay (FDA approved for NASAL specimens only), is one component of a comprehensive MRSA colonization surveillance program. It is not intended to diagnose MRSA infection nor to guide or monitor treatment for MRSA infections. Performed at [facility], [place] [address], [place], [place] [address]   Culture, blood (routine x 2)     Status: None (Preliminary result)   Collection Time: 05/06/18  2:25 AM  Result Value Ref Range Status   Specimen Description   Final    BLOOD LEFT HAND Performed at [place] [address] [place], [place] [address]    Special Requests   Final    BOTTLES DRAWN AEROBIC AND ANAEROBIC Blood Culture adequate volume Performed at [facility] [address], [place], [place] [address]    Culture   Final    NO GROWTH 2 DAYS Performed at [facility] [address], [place], [place] [address]    Report Status PENDING  Incomplete  Culture, blood (routine x 2)     Status: None (Preliminary result)   Collection Time: 05/06/18  2:25 AM  Result Value Ref Range Status   Specimen Description   Final    BLOOD LEFT ANTECUBITAL Performed at [place] [address] [place], [name] [address]    Special Requests   Final    BOTTLES DRAWN AEROBIC AND ANAEROBIC Blood Culture adequate volume Performed at  [facility], [place] [address], [place], [place] [address]    Culture   Final    NO GROWTH 2 DAYS Performed at [facility] [address], [place], [place] [address]    Report Status PENDING  Incomplete  Body fluid culture     Status: None   Collection Time: 05/06/18 10:18 AM  Result Value Ref Range Status   Specimen Description   Final    PERITONEAL CAVITY Performed at [facility] [address] [place], [place] [address]    Special Requests   Final    NONE Performed at [facility], [place] [address], [place], [place] [address]    Gram Stain   Final    MODERATE WBC PRESENT, PREDOMINANTLY PMN NO ORGANISMS SEEN    Culture   Final    NO GROWTH 3 DAYS Performed at [facility] [address], [place],  [address]    Report Status 05/09/2018 FINAL  Final         Radiology Studies: Mr Thoracic Spine W Wo Contrast  Result Date: 05/08/2018 CLINICAL DATA:  Back pain.  Metastatic lung cancer. EXAM: MRI THORACIC WITHOUT AND WITH CONTRAST TECHNIQUE: Multiplanar and multiecho pulse sequences of the thoracic spine were obtained without and with intravenous contrast. CONTRAST:  Gadavist 8.5 mL COMPARISON:  Chest CT 05/05/2018 FINDINGS: The patient was unable to remain motionless for the exam. Small or  subtle lesions could be overlooked. MRI THORACIC SPINE FINDINGS Alignment:  Physiologic. Vertebrae: No fracture, evidence of discitis, or bone lesion. Scattered hemangiomata. Post infusion, no abnormal vertebral body is enhancement. Cord: Essentially normal signal and morphology. Slight deformity related to disc protrusion described below, with no abnormal signal. Paraspinal and other soft tissues: BILATERAL effusions. Ascites. Hepatic tumor and omental lesions poorly visualized. Disc levels: No compressive disc herniation except for T5-6 on the LEFT, where there is mild mass effect on the subarachnoid space and slight LEFT-sided cord flattening. No significant spinal stenosis. No abnormal cord signal. IMPRESSION: Suboptimal, motion degraded MRI of the thoracic spine without and with contrast demonstrates no definite areas of osseous metastatic disease. Slight cord deformity related to calcified T5-6 protrusion on the LEFT, but no stenosis or abnormal cord signal. No abnormal enhancement of the cord or intraspinal contents. Additional extra-spinal findings better detailed on CT chest abdomen pelvis, including BILATERAL pleural effusions and ascites, as well as visceral disease. Electronically Signed   By: Staci Righter M.D.   On: 05/08/2018 21:51   Dg Abd Acute W/chest  Result Date: 05/08/2018 CLINICAL DATA:  Patient with abdominal distension. EXAM: DG ABDOMEN ACUTE W/ 1V CHEST COMPARISON:  Abdominal CT 05/05/2018 FINDINGS: Left anterior chest wall Port-A-Cath is present with tip projecting over the superior vena cava. The stable cardiac and mediastinal contours. Low lung volumes. Bibasilar atelectasis. Gas is demonstrated within nondilated loops of large and small bowel in a nonobstructed pattern. Large amount of stool throughout the colon. Decubitus view demonstrates no free intraperitoneal air. Cholecystectomy clips. IMPRESSION: Large amount of stool throughout the colon as can be seen with constipation.  Nonobstructed bowel gas pattern. Low lung volumes with basilar atelectasis. Cardiomegaly. Electronically Signed   By: Lovey Newcomer M.D.   On: 05/08/2018 21:10        Scheduled Meds: . calcium-vitamin D  1 tablet Oral Q breakfast  . enoxaparin  120 mg Subcutaneous Q24H  . feeding supplement (ENSURE ENLIVE)  237 mL Oral AC breakfast  .  folic acid  1 mg Oral Daily  . furosemide  40 mg Intravenous Q6H  . [START ON 05/11/2018] furosemide  40 mg Oral Daily  . guaiFENesin  1,200 mg Oral BID  . levETIRAcetam  500 mg Oral BID  . mineral oil  1 enema Rectal Once  . multivitamin with minerals  1 tablet Oral Daily  . neomycin-polymyxin b-dexamethasone  2 drop Both Eyes Q6H  . pantoprazole  40 mg Oral BID  . polyethylene glycol  17 g Oral Daily  . senna-docusate  2 tablet Oral QHS  . sodium chloride flush  10-40 mL Intracatheter Q12H  . sodium chloride flush  3 mL Intravenous Q12H  . sucralfate  1 g Oral QID   Continuous Infusions: . sodium chloride 10 mL/hr at 05/09/18 0942  . albumin human    . cefTRIAXone (ROCEPHIN)  IV 2 g (05/10/18 0847)     LOS: 3 days    Time spent: 35 mins.More than 50% of that time was spent in counseling and/or coordination of care.      Shelly Coss, MD [facility] Pager [phone]  If 7PM-7AM, please contact night-coverage [email or web address] Password [email or web address] 05/10/2018, 12:30 PM

## 2018-05-11 ENCOUNTER — Inpatient Hospital Stay (HOSPITAL_COMMUNITY): Payer: Medicare Other

## 2018-05-11 DIAGNOSIS — K5900 Constipation, unspecified: Secondary | ICD-10-CM

## 2018-05-11 DIAGNOSIS — Z515 Encounter for palliative care: Secondary | ICD-10-CM

## 2018-05-11 DIAGNOSIS — I471 Supraventricular tachycardia: Secondary | ICD-10-CM

## 2018-05-11 DIAGNOSIS — R569 Unspecified convulsions: Secondary | ICD-10-CM

## 2018-05-11 DIAGNOSIS — D72819 Decreased white blood cell count, unspecified: Secondary | ICD-10-CM

## 2018-05-11 DIAGNOSIS — N390 Urinary tract infection, site not specified: Secondary | ICD-10-CM

## 2018-05-11 DIAGNOSIS — Z7189 Other specified counseling: Secondary | ICD-10-CM

## 2018-05-11 DIAGNOSIS — B961 Klebsiella pneumoniae [K. pneumoniae] as the cause of diseases classified elsewhere: Secondary | ICD-10-CM

## 2018-05-11 LAB — CBC WITH DIFFERENTIAL/PLATELET
ABS IMMATURE GRANULOCYTES: 0.07 10*3/uL (ref 0.00–0.07)
BASOS PCT: 1 %
Basophils Absolute: 0 10*3/uL (ref 0.0–0.1)
EOS ABS: 0.1 10*3/uL (ref 0.0–0.5)
Eosinophils Relative: 3 %
HCT: 28.7 % — ABNORMAL LOW (ref 36.0–46.0)
Hemoglobin: 9.7 g/dL — ABNORMAL LOW (ref 12.0–15.0)
Immature Granulocytes: 4 %
Lymphocytes Relative: 14 %
Lymphs Abs: 0.3 10*3/uL — ABNORMAL LOW (ref 0.7–4.0)
MCH: 30.9 pg (ref 26.0–34.0)
MCHC: 33.8 g/dL (ref 30.0–36.0)
MCV: 91.4 fL (ref 80.0–100.0)
MONOS PCT: 31 %
Monocytes Absolute: 0.6 10*3/uL (ref 0.1–1.0)
Neutro Abs: 1 10*3/uL — ABNORMAL LOW (ref 1.7–7.7)
Neutrophils Relative %: 47 %
PLATELETS: 219 10*3/uL (ref 150–400)
RBC: 3.14 MIL/uL — ABNORMAL LOW (ref 3.87–5.11)
RDW: 15 % (ref 11.5–15.5)
WBC: 2 10*3/uL — ABNORMAL LOW (ref 4.0–10.5)
nRBC: 0 % (ref 0.0–0.2)

## 2018-05-11 LAB — BASIC METABOLIC PANEL
Anion gap: 8 (ref 5–15)
BUN: 12 mg/dL (ref 8–23)
CO2: 30 mmol/L (ref 22–32)
Calcium: 8.8 mg/dL — ABNORMAL LOW (ref 8.9–10.3)
Chloride: 94 mmol/L — ABNORMAL LOW (ref 98–111)
Creatinine, Ser: 0.74 mg/dL (ref 0.44–1.00)
GFR calc Af Amer: >60 mL/min (ref >60)
GLUCOSE: 113 mg/dL — AB (ref 70–99)
POTASSIUM: 4.1 mmol/L (ref 3.5–5.1)
Sodium: 132 mmol/L — ABNORMAL LOW (ref 135–145)

## 2018-05-11 LAB — CULTURE, BLOOD (ROUTINE X 2)
CULTURE: NO GROWTH
CULTURE: NO GROWTH
SPECIAL REQUESTS: ADEQUATE
Special Requests: ADEQUATE

## 2018-05-11 MED ORDER — CEPHALEXIN 500 MG PO CAPS
500.0000 mg | ORAL_CAPSULE | Freq: Two times a day (BID) | ORAL | Status: DC
  Administered 2018-05-12 – 2018-05-13 (×3): 500 mg via ORAL
  Filled 2018-05-11 (×2): qty 1

## 2018-05-11 MED ORDER — POLYETHYLENE GLYCOL 3350 17 G PO PACK
17.0000 g | PACK | Freq: Two times a day (BID) | ORAL | Status: DC
  Administered 2018-05-11 – 2018-05-13 (×3): 17 g via ORAL
  Filled 2018-05-11 (×3): qty 1

## 2018-05-11 MED ORDER — INSULIN ASPART 100 UNIT/ML ~~LOC~~ SOLN: 5.0000 [IU] | Freq: Once | SUBCUTANEOUS | Status: DC

## 2018-05-11 MED ORDER — METOCLOPRAMIDE HCL 5 MG/ML IJ SOLN
10.0000 mg | Freq: Three times a day (TID) | INTRAMUSCULAR | Status: DC
  Administered 2018-05-11 – 2018-05-13 (×6): 10 mg via INTRAVENOUS
  Filled 2018-05-11 (×6): qty 2

## 2018-05-11 MED ORDER — MAGNESIUM CITRATE PO SOLN
1.0000 | Freq: Once | ORAL | Status: AC
  Administered 2018-05-11: 1 via ORAL
  Filled 2018-05-11: qty 296

## 2018-05-11 MED ORDER — METOPROLOL TARTRATE 25 MG PO TABS
25.0000 mg | ORAL_TABLET | Freq: Two times a day (BID) | ORAL | Status: DC
  Administered 2018-05-11 – 2018-05-13 (×2): 25 mg via ORAL
  Filled 2018-05-11 (×4): qty 1

## 2018-05-11 NOTE — Care Management Note (Signed)
Case Management Note  Patient Details  Name: Molly Black MRN: [id number] Date of Birth: [birth year]  Subjective/Objective: Rounding team discussed w/patient d/c plans-patient became tearful when recommendation for SNF became an option-patient adamantly only wants home w/HHC.Her sister n law(Molly Black) expressed her concerns about her own ability to help patient @ home-limited. Patient wanted me to contact her niece n law-Molly Black about which [facility] to [facility] chosen-rep Santiago Glad aware of HHRN/PT/OT/aide/CSW;rw,3n1 to be delivered to rm prior d/c. Patient has decided to go home with family to transport home-they have a ramp, & patient will use rw to get to the rm where the bed is located. Offered hospital bed, & PTAR-patient declines both. PTAR forms in shadow chart just in case the need for ambulance for safety is agreed by patient-Nsg aware.                Action/Plan:d/c home w/HHC/dme   Expected Discharge Date:  (unknown)               Expected Discharge Plan:  [place]  In-House Referral:     Discharge planning Services  CM Consult  Post Acute Care Choice:  Durable Medical Equipment(rw,cpap) Choice offered to:  Patient, Adult Children  DME Arranged:  3-N-1, [place] rolling DME Agency:  [place]:  RN, PT, OT, Nurse's Aide, Social Work [employer] Agency:  [place]  Status of Service:  Completed, signed off  If discussed at [employer], dates discussed:    Additional Comments:  Dessa Phi, RN 05/11/2018, 12:43 PM

## 2018-05-11 NOTE — Progress Notes (Signed)
PROGRESS NOTE    [facility]   XVQ:[id number]  DOB: 1940/08/10  DOA: 05/05/2018 PCP: [name], MD   Brief Narrative:  [name] a 77 year old female with past medical history of hypertension, breast cancer, stage IV non-small cell lung cancer on chemotherapy who presented to the emergency department for the evaluation of abdominal pain and distention.  CT imaging done on this admission showed new hepatic masses, moderate ascites, involvement of the small bowel and omentum suggesting possible progression of her metastatic lung cancer.  Underwent paracentesis and the fluid analysis is suggestive of spontaneous bacterial peritonitis.  Started on antibiotics.   Subjective: Has not had a BM in 6 days and feels bloated- she is very weak.  Assessment & Plan:   Principal Problem:  Ascites with SBP (spontaneous bacterial peritonitis) - underwent paracentesis on 05/06/18 with removal of 1.9 L of fluid . 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 (liver, Jejunum, omentum, adrenal glands and brain - patient would like to continue chemo with Dr Julien Nordmann - she has had a palliative care discussion and has declined hospice - I have discussed her case with case management today  Mild leukopenia today  - follow- cause uncertain  Klebsiella pneumoniae UTI - cont to treat for total 7 days- transition for Ceftriaxaone to Keflex  Seizure disorder  Continue  Keppra  History of DVT  Continue Lovenox at 1.5 mg/kg q24h  Hyponatremia - sodium 127 has improved to 132- cont to hold HCTZ  Episodes of SVT - noted on telemetry - add Metoprolol and cont to follow  HTN - cont Losartan- hold HCTZ    DVT prophylaxis: Lovenox Code Status: Full code Family Communication: extensive conversation with family at bedside Disposition Plan: home possibly tomorrow- f/u on SVT and constipation Consultants:   Oncology  Palliative care Procedures:   paracentesis Antimicrobials:  Anti-infectives (From admission, onward)   Start     Dose/Rate Route Frequency Ordered Stop   05/12/18 1000  cephALEXin (KEFLEX) capsule 500 mg     500 mg Oral Every 12 hours 05/11/18 1112 05/14/18 0959   05/07/18 0900  cefTRIAXone (ROCEPHIN) 2 g in sodium chloride 0.9 % 100 mL IVPB     2 g 200 mL/hr over 30 Minutes Intravenous Daily 05/07/18 0754 05/11/18 1244       Objective: Vitals:   05/10/18 2123 05/10/18 2241 05/11/18 0500 05/11/18 0545  BP: 98/70   90/70  Pulse: 95 61  92  Resp: 18 20  16   Temp: 98.2 F (36.8 C)   99.6 F (37.6 C)  TempSrc: Oral   Oral  SpO2: 94% 95%  93%  Weight:   87.4 kg   Height:        Intake/Output Summary (Last 24 hours) at 05/11/2018 1355 Last data filed at 05/11/2018 0830 Gross per 24 hour  Intake 880.71 ml  Output 200 ml  Net 680.71 ml   [employer]  05/08/18 0555 05/10/18 0506 05/11/18 0500  Weight: 87.9 kg 90.4 kg 87.4 kg    Examination: General exam: Appears comfortable  HEENT: PERRLA, oral mucosa moist, no sclera icterus or thrush Respiratory system: Clear to auscultation. Respiratory effort normal. Cardiovascular system: S1 & S2 heard, RRR.   Gastrointestinal system: Abdomen soft, non-tender, moderately distended. Normal bowel sound.   Central nervous system: Alert and oriented. No focal neurological deficits. Extremities: No cyanosis, clubbing or edema Skin: No rashes or ulcers Psychiatry:  Mood &  affect appropriate.     Data Reviewed: I have personally reviewed following labs and imaging studies  CBC: Recent Labs  Lab 05/05/18 1842 05/06/18 0225 05/11/18 0403  WBC 11.2* 10.4 2.0*  NEUTROABS  --  9.9* 1.0*  HGB 10.4* 10.5* 9.7*  HCT 31.0* 31.6* 28.7*  MCV 92.0 94.0 91.4  PLT 214 199 414   Basic Metabolic Panel: Recent Labs  Lab 05/06/18 0225 05/07/18 0618 05/09/18 0203 05/10/18 1441 05/11/18 0403  NA 131* 132* 132* 127* 132*  K 3.4* 4.0 4.7 4.0 4.1  CL 100 100 98 93* 94*  CO2 24 25 25 26 30   GLUCOSE 125* 104* 120* 133* 113*  BUN 15 14 13 11 12   CREATININE 0.62 0.59 0.65 0.60 0.74  CALCIUM 8.5* 8.2* 8.3* 8.1* 8.8*  MG 1.8  --   --  1.7  --    GFR: Estimated Creatinine Clearance: 63.3 mL/min (by C-G formula based on SCr of 0.74 mg/dL). Liver Function Tests: Recent Labs  Lab 05/05/18 1842 05/06/18 0225  AST 30 27  ALT 23 21  ALKPHOS 25* 25*  BILITOT 1.5* 1.6*  PROT 4.7* 4.8*  ALBUMIN 2.3* 2.4*   Recent Labs  Lab 05/05/18 1842  LIPASE 23   No results for input(s): AMMONIA in the last 168 hours. Coagulation Profile: No results for input(s): INR, PROTIME in the last 168 hours. Cardiac Enzymes: Recent Labs  Lab 05/08/18 2000 05/09/18 0203 05/09/18 0805  TROPONINI <0.03 <0.03 <0.03   BNP (last 3 results) No results for input(s): PROBNP in the last 8760 hours. HbA1C: No results for input(s): HGBA1C in the last 72 hours. CBG: No results for input(s): GLUCAP in the last 168 hours. Lipid Profile: No results for input(s): CHOL, HDL, LDLCALC, TRIG, CHOLHDL, LDLDIRECT in the last 72 hours. Thyroid Function Tests: No results for input(s): TSH, T4TOTAL, FREET4, T3FREE, THYROIDAB in the last 72 hours. Anemia Panel: No results for input(s): VITAMINB12, FOLATE, FERRITIN, TIBC, IRON, RETICCTPCT in the last 72 hours. Urine analysis:    Component Value Date/Time   COLORURINE YELLOW 05/06/2018 0048   APPEARANCEUR CLEAR 05/06/2018 0048   LABSPEC 1.041  (H) 05/06/2018 0048   LABSPEC 1.010 10/16/2016 1530   PHURINE 5.0 05/06/2018 0048   GLUCOSEU NEGATIVE 05/06/2018 0048   GLUCOSEU Negative 10/16/2016 1530   HGBUR SMALL (A) 05/06/2018 0048   BILIRUBINUR NEGATIVE 05/06/2018 0048   BILIRUBINUR Negative 10/16/2016 1530   KETONESUR 5 (A) 05/06/2018 0048   PROTEINUR NEGATIVE 05/06/2018 0048   UROBILINOGEN 0.2 10/16/2016 1530   NITRITE NEGATIVE 05/06/2018 0048   LEUKOCYTESUR NEGATIVE 05/06/2018 0048   LEUKOCYTESUR Negative 10/16/2016 1530   Sepsis Labs: @LABRCNTIP (procalcitonin:4,lacticidven:4) ) Recent Results (from the past 240 hour(s))  Culture, Urine     Status: Abnormal   Collection Time: 05/06/18 12:48 AM  Result Value Ref Range Status   Specimen Description   Final    URINE, CLEAN CATCH Performed at [facility], [place] [address], [place], [place] [address]  Special Requests   Final    NONE Performed at [facility], [place] [address] [place], [place] [address]    Culture >=100,000 COLONIES/mL KLEBSIELLA PNEUMONIAE (A)  Final   Report Status 05/08/2018 FINAL  Final   Organism ID, Bacteria KLEBSIELLA PNEUMONIAE (A)  Final      Susceptibility   Klebsiella pneumoniae - MIC*    AMPICILLIN >=32 RESISTANT Resistant     CEFAZOLIN <=4 SENSITIVE Sensitive     CEFTRIAXONE <=1 SENSITIVE Sensitive     CIPROFLOXACIN <=0.25 SENSITIVE Sensitive     GENTAMICIN <=1 SENSITIVE Sensitive     IMIPENEM <=0.25 SENSITIVE Sensitive     NITROFURANTOIN 128 RESISTANT Resistant     TRIMETH/SULFA <=20 SENSITIVE Sensitive     AMPICILLIN/SULBACTAM 8 SENSITIVE Sensitive     PIP/TAZO <=4 SENSITIVE Sensitive     Extended ESBL NEGATIVE Sensitive     * >=100,000 COLONIES/mL KLEBSIELLA PNEUMONIAE  MRSA PCR Screening     Status: None   Collection Time: 05/06/18  1:51 AM  Result Value Ref Range Status   MRSA by PCR NEGATIVE NEGATIVE Final    Comment:        The GeneXpert MRSA Assay (FDA approved for NASAL  specimens only), is one component of a comprehensive MRSA colonization surveillance program. It is not intended to diagnose MRSA infection nor to guide or monitor treatment for MRSA infections. Performed at [facility], [place] [address] [place], [place] [address]   Culture, blood (routine x 2)     Status: None   Collection Time: 05/06/18  2:25 AM  Result Value Ref Range Status   Specimen Description   Final    BLOOD LEFT HAND Performed at [place] [address], [place], [place] [address]    Special Requests   Final    BOTTLES DRAWN AEROBIC AND ANAEROBIC Blood Culture adequate volume Performed at [place] [address] [place], [place] [address]    Culture   Final    NO GROWTH 5 DAYS Performed at [facility], [place] [address], [place], [place] [address]    Report Status 05/11/2018 FINAL  Final  Culture, blood (routine x 2)     Status: None   Collection Time: 05/06/18  2:25 AM  Result Value Ref Range Status   Specimen Description   Final    BLOOD LEFT ANTECUBITAL Performed at [place] [address], [place], [place] [address]    Special Requests   Final    BOTTLES DRAWN AEROBIC AND ANAEROBIC Blood Culture adequate volume Performed at [place] [address], [place], [place] [address]    Culture   Final    NO GROWTH 5 DAYS Performed at [facility], [place] [address], [place], [place] [address]    Report Status 05/11/2018 FINAL  Final  Body fluid culture     Status: None   Collection Time: 05/06/18 10:18 AM  Result Value Ref Range Status   Specimen Description   Final    PERITONEAL CAVITY Performed at [place] [address] [place], [place] [address]    Special Requests   Final    NONE Performed at [facility], [place] [address], [place], [place] [address]    Gram Stain   Final    MODERATE WBC  PRESENT, PREDOMINANTLY PMN NO ORGANISMS SEEN    Culture   Final    NO  GROWTH 3 DAYS Performed at [facility], [place] [address] [place], [place] [address]    Report Status 05/09/2018 FINAL  Final         Radiology Studies: US Abdomen Limited  Result Date: 05/10/2018 CLINICAL DATA:  77 year old female with recurrent ascites secondary to malignancy EXAM: LIMITED ABDOMEN ULTRASOUND FOR ASCITES TECHNIQUE: Limited ultrasound survey for ascites was performed in all four abdominal quadrants. COMPARISON:  None. FINDINGS: Ultrasound survey demonstrates small volume ascites. The patient discussed the case with the interventional team, and did not want paracentesis performed. IMPRESSION: Small volume ascites with no paracentesis performed. Electronically Signed   By: Corrie Mckusick D.O.   On: 05/10/2018 16:27   Dg Abd 2 Views  Result Date: 05/11/2018 CLINICAL DATA:  Shortness of breath, abdominal distension EXAM: ABDOMEN - 2 VIEW COMPARISON:  CT abdomen pelvis of 05/05/2017 FINDINGS: There is a moderately large amount of feces throughout the colon. No bowel obstruction is seen. On the left large decubitus of the abdomen no free air is seen. However there is opacity at the left lung base which may reflect atelectasis, but pneumonia cannot be excluded. No opaque calculi are seen. The bones are unremarkable. IMPRESSION: 1. Large amount of feces throughout the colon. No bowel obstruction. 2. No free air. 3. Opacity at the left lung base may reflect atelectasis or possibly pneumonia. Electronically Signed   By: Ivar Drape M.D.   On: 05/11/2018 13:12      Scheduled Meds: . calcium-vitamin D  1 tablet Oral Q breakfast  . [START ON 05/12/2018] cephALEXin  500 mg Oral Q12H  . enoxaparin  120 mg Subcutaneous Q24H  . feeding supplement (ENSURE ENLIVE)  237 mL Oral AC breakfast  . folic acid  1 mg Oral Daily  . furosemide  40 mg Oral Daily  . guaiFENesin  1,200 mg Oral BID  . levETIRAcetam  500 mg  Oral BID  . metoprolol tartrate  25 mg Oral BID  . multivitamin with minerals  1 tablet Oral Daily  . neomycin-polymyxin b-dexamethasone  2 drop Both Eyes Q6H  . pantoprazole  40 mg Oral BID  . polyethylene glycol  17 g Oral Daily  . senna-docusate  2 tablet Oral QHS  . sodium chloride flush  10-40 mL Intracatheter Q12H  . sodium chloride flush  3 mL Intravenous Q12H  . sucralfate  1 g Oral QID   Continuous Infusions: . sodium chloride 250 mL (05/11/18 0605)  . albumin human       LOS: 4 days    Time spent in minutes: 45- > 50 % of time taken in discussing plan of care with caretakers, other consultants, case management, pharmacy, RN    Debbe Odea, MD [facility] Pager: [email or web address] Password [email or web address] 05/11/2018, 1:55 PM

## 2018-05-11 NOTE — Progress Notes (Signed)
PT Cancellation Note  Patient Details Name: Molly Black MRN: [id number] DOB: 1940/08/10   Cancelled Treatment:    Reason Eval/Treat Not Completed: Fatigue/lethargy limiting ability to participate(pt sleeping soundly, caregiver requested pt not be disturbed. Will check back tomorrow. )  Philomena Doheny PT 05/11/2018  Acute Rehabilitation Services Pager [phone] Office [phone]

## 2018-05-11 NOTE — Progress Notes (Signed)
PMT progress note  77 y.o. female  with past medical history of HTN, breast cancer, Stage IV NSCLC admitted on 05/05/2018 with abdominal pain and distention with new findings consistent with liver and peritoneal mets with malignant ascites with SBP.  Palliative consulted for [place].  Patient seen and examined, sister in law Ruby and caregiver present at bedside. Care manager Dessa Phi also present.   BP 90/70 (BP Location: Left Arm)   Pulse 92   Temp 99.6 F (37.6 C) (Oral)   Resp 16   Ht 5\' 2"  (1.575 m)   Wt 87.4 kg   SpO2 93%   BMI 35.25 kg/m  Labs and imaging noted.  CT from 10-10 noted as well.   Dr Lew Dawes note also reviewed.   Awake alert resting in bed Lungs are clear S1-S2 Abdomen is distended Extremities no edema warm to touch Non focal  Palliative performance scale 40%  Stage IV non-small cell lung cancer Constipation Functional decline Imaging evidence of new hepatic masses, metastatic disease in left upper quadrant along with ascites. Patient is status post paracenteses in this hospitalization with cytology showing metastatic adenocarcinoma.  Appreciate medical oncology input. Patient is looking forward to her next appointment with her primary oncologist Dr Earlie Server to further discuss treatment options. Her goals at this time are for any and all life maintaining/life-prolonging measures to continue.  We discussed about the severity of her illness in detail. We discussed about the symptoms she is having. Sister-in-law present at the bedside is most concerned about her constipation. Patient's last bowel movement reportedly was 6 days ago. Patient is on several bowel regimen agents. This was discussed with the family. Discussed about metastatic burden in the abdomen in detail, also discussed about patient having had malignant ascites. Cancer related functional decline, cancer related cachexia and anorexia also discussed in detail with family. Patient may need further  abdominal imaging if she does not have a bowel movement or if her distention persists. Interventional radiology did attempt a limited abdominal ultrasound on 05-10-18, but the patient elected to postpone therapeutic paracentesis until she again becomes symptomatic.  Patient has named her nephew Elta Guadeloupe is her healthcare power of attorney agent. Elta Guadeloupe is Ruby's son. Ruby is the patient's sister-in-law.  Safe disposition options were also discussed in great detail along with care manager being present at the bedside. Different options including home with home based physical therapy as well as short-term skilled nursing facility rehabilitation attempt with palliative care following was discussed in great detail. Patient is alert awake oriented and decisional, makes a strong desire for going back home. She does not want to go to rehabilitation at this time.  All of the patient and family's questions answered to the best of my ability. It is anticipated that the patient will likely be discharged home with home health care soon and will follow-up with her medical oncologist Dr Earlie Server in the outpatient setting.   Gently discussed with the patient, that as of now, while the focus remains on continuation of life maintaining/life-prolonging therapies, I discussed with her extensively about "palliative" treatment options versus "curative" treatment options. Gently also introduced hospice philosophy of care.   35 minutes spent.  Loistine Chance MD [facility] health palliative medicine team [phone]

## 2018-05-11 NOTE — Progress Notes (Signed)
CSW consulted for SNF. CSW notified by patient's RNCM that the plan is for patient to discharge home. CSW signing off, no other needs identified at this time.  Abundio Miu, [place] Social Worker [facility] Cell#: [phone]

## 2018-05-12 DIAGNOSIS — D6481 Anemia due to antineoplastic chemotherapy: Secondary | ICD-10-CM

## 2018-05-12 DIAGNOSIS — R197 Diarrhea, unspecified: Secondary | ICD-10-CM

## 2018-05-12 MED ORDER — METOPROLOL TARTRATE 25 MG PO TABS: 25.0000 mg | ORAL_TABLET | Freq: Two times a day (BID) | ORAL | 0 refills | Status: DC

## 2018-05-12 MED ORDER — CEPHALEXIN 500 MG PO CAPS: 500.0000 mg | ORAL_CAPSULE | Freq: Two times a day (BID) | ORAL | 0 refills | Status: DC

## 2018-05-12 MED ORDER — SENNOSIDES-DOCUSATE SODIUM 8.6-50 MG PO TABS: 2.0000 | ORAL_TABLET | Freq: Every day | ORAL | 0 refills | Status: AC

## 2018-05-12 MED ORDER — GUAIFENESIN ER 600 MG PO TB12: 600.0000 mg | ORAL_TABLET | Freq: Two times a day (BID) | ORAL | 0 refills | Status: AC | PRN

## 2018-05-12 MED ORDER — ACETAMINOPHEN 325 MG PO TABS: 650.0000 mg | ORAL_TABLET | Freq: Four times a day (QID) | ORAL | Status: AC | PRN

## 2018-05-12 MED ORDER — ONDANSETRON HCL 4 MG PO TABS: 4.0000 mg | ORAL_TABLET | Freq: Four times a day (QID) | ORAL | 0 refills | Status: AC | PRN

## 2018-05-12 MED ORDER — OXYCODONE HCL 10 MG PO TABS: 10.0000 mg | ORAL_TABLET | Freq: Four times a day (QID) | ORAL | 0 refills | Status: DC | PRN

## 2018-05-12 MED ORDER — MAGNESIUM CITRATE PO SOLN
1.0000 | Freq: Once | ORAL | Status: AC
  Administered 2018-05-12: 1 via ORAL
  Filled 2018-05-12: qty 296

## 2018-05-12 MED ORDER — POLYETHYLENE GLYCOL 3350 17 G PO PACK: 17.0000 g | PACK | Freq: Two times a day (BID) | ORAL | 0 refills | Status: AC

## 2018-05-12 NOTE — Care Management Note (Signed)
Case Management Note  Patient Details  Name: Molly Black MRN: [id number] Date of Birth: 07/25/1941  Subjective/Objective:Spoke to patient, & Sheila(per patient request) they both agree to d/c by ambulance transp-PTAR-forms already in chart for nsg to call when ready. No further CM needs.                    Action/Plan:d/c home w/HHC/dme/PTAR.   Expected Discharge Date:  (unknown)               Expected Discharge Plan:  [place]  In-House Referral:     Discharge planning Services  CM Consult  Post Acute Care Choice:  Durable Medical Equipment(rw,cpap) Choice offered to:  Patient, Adult Children  DME Arranged:  3-N-1, Walker rolling DME Agency:  [place]:  RN, PT, OT, Nurse's Aide, Social Work [employer] Agency:  [place]  Status of Service:  Completed, signed off  If discussed at [employer], dates discussed:    Additional Comments:  Dessa Phi, RN 05/12/2018, 11:51 AM

## 2018-05-12 NOTE — Progress Notes (Signed)
Physical Therapy Treatment Patient Details Name: Molly Black MRN: [id number] DOB: [birth year] Today's Date: 05/12/2018    History of Present Illness 77 yo female with onset of abd pain and admission for peritonitis was referred to PT for mobility and dc planning.  Her SIRS is being managed and now is hoping to go home.  PMHx:  mult cancers including new liver mets, seizures, ascites, OSA, brain mets, UTI, schatzki's ring, chemotherapy, shoulder dislocation L, port a cath    PT Comments    Assisted pt OOB to amb to bathroom.  Required assist to safely navigate walker around obstacles and complete turns.  Pt appears slow/sluggish and required repeat VC's to complete even a functional task.  Assisted with amb in hallway using a 4WW with seat, pt required assist to properly lock brakes and complete turn prior to sit.  Pt c/o increased dizziness.  Once seated BP 86/65, HR 105, RA 94%.  Recliner brought to pt to return to room.  Reported to RN low BP and reported to NT loose stool.  Follow Up Recommendations  Home health PT;SNF;Supervision/Assistance - 24 hour(pt declines SNF)     Equipment Recommendations  Rolling walker with 5" wheels;3in1 (PT);Wheelchair (measurements PT);Wheelchair cushion (measurements PT)    Recommendations for Other Services       Precautions / Restrictions Precautions Precautions: Fall Restrictions Weight Bearing Restrictions: No    Mobility  Bed Mobility Overal bed mobility: Needs Assistance Bed Mobility: Supine to Sit     Supine to sit: Min guard;Min assist     General bed mobility comments: increased time and assist upper body   Transfers Overall transfer level: Needs assistance Equipment used: Rolling walker (2 wheeled);4-wheeled walker Transfers: Sit to/from [facility] Sit to Stand: Min assist Stand pivot transfers: Min assist;Mod assist       General transfer comment: required assist to safely navigate walker around  odsticles.  Pt slightly slow/sluggish to respond to directions   Ambulation/Gait Ambulation/Gait assistance: Min assist;Mod assist Gait Distance (Feet): 55 Feet Assistive device: 4-wheeled walker Gait Pattern/deviations: Step-through pattern;Decreased stride length;Drifts right/left     General Gait Details: VCs for positioning in RW, min A to manage RW with turns, VCs hand placement on RW,   Distance limited by dizziness.     Stairs             Wheelchair Mobility    Modified Rankin (Stroke Patients Only)       Balance                                            Cognition Arousal/Alertness: Awake/alert Behavior During Therapy: WFL for tasks assessed/performed Overall Cognitive Status: Impaired/Different from baseline Area of Impairment: Awareness;Safety/judgement;Following commands                               General Comments: required repeat cueing with functional mobility and assist to correctly navigate walker around obsticles      Exercises      General Comments        Pertinent Vitals/Pain Pain Assessment: No/denies pain    Home Living                      Prior Function            PT Goals (  current goals can now be found in the care plan section) Progress towards PT goals: Progressing toward goals    Frequency    Min 3X/week      PT Plan Current plan remains appropriate    Co-evaluation              AM-PAC PT "6 Clicks" Daily Activity  Outcome Measure  Difficulty turning over in bed (including adjusting bedclothes, sheets and blankets)?: A Little Difficulty moving from lying on back to sitting on the side of the bed? : A Lot Difficulty sitting down on and standing up from a chair with arms (e.g., wheelchair, bedside commode, etc,.)?: A Lot Help needed moving to and from a bed to chair (including a wheelchair)?: A Lot Help needed walking in hospital room?: A Lot Help needed climbing  3-5 steps with a railing? : A Lot 6 Click Score: 13    End of Session Equipment Utilized During Treatment: Gait belt Activity Tolerance: Patient tolerated treatment well;Patient limited by fatigue Patient left: with call bell/phone within reach;with family/visitor present;in chair Nurse Communication: Mobility status PT Visit Diagnosis: Unsteadiness on feet (R26.81);Difficulty in walking, not elsewhere classified (R26.2)     Time: 7939-0300 PT Time Calculation (min) (ACUTE ONLY): 24 min  Charges:  $Gait Training: 8-22 mins $Therapeutic Activity: 8-22 mins                     Rica Koyanagi  PTA Acute  Rehabilitation Services Pager      [phone] Office      [phone]

## 2018-05-12 NOTE — Discharge Summary (Addendum)
Physician Discharge Summary  Loney Peto [facility] UMP:[id number] DOB: [birth year] DOA: 05/05/2018  PCP: [name], MD  Admit date: 05/05/2018 Discharge date: 05/12/2018  Admitted From: home  Disposition:  home   Recommendations for Outpatient Follow-up:  1. She has declined hospice and declined SNF- high risk for readmission  2. Bmet in 1 wk to follow sodium level 3. CBC in 1 wk (WBC 2.0 today, Hb 9.7)  Home Health:  ordered    Discharge Condition:  stable   CODE STATUS:  Full code   Diet recommendation:  regular Consultations:  Oncology  Palliative care    Discharge Diagnoses:  Principal Problem:   SBP (spontaneous bacterial peritonitis) ([place]) Active Problems:   Adenocarcinoma of right lung, stage 4 -   Brain metastases, abdominal metastasis   OSA on CPAP   Convulsions/seizures (HCC)   Hypokalemia   Antineoplastic chemotherapy induced anemia   Personal history of DVT (deep vein thrombosis)   Hyponatremia   Ascites   Constipated   Encounter for palliative care   Goals of care, counseling/discussion  SVT      Brief Summary: Molly Black a 77 year old female with past medical history of hypertension, breast cancer, stage IV non-small cell lung cancer on chemotherapy who presented to the emergency department for the evaluation of abdominal pain and distention. CT imaging done on this admission showed new hepatic masses, moderate ascites, involvement of the small bowel and omentum suggesting possible progression of her metastatic lung cancer.Underwent paracentesis and the fluid analysis is suggestive of spontaneous bacterial peritonitis. Started on antibiotics.   Hospital Course:    Principal Problem:  Ascites with SBP (spontaneous bacterial peritonitis) - underwent paracentesis on 05/06/18 with removal of 1.9 L of fluid . - has completed 5 days of Ceftriaxone - ascitic fluid culture was negative - abdomen still noted to be distended by yesterday there was  not enough fluid on ultrasound to drain out of abdomen  Active Problems: Constipation- abdominal distension - xray of abdomen>> has a large stool burden- no obstruction - no stool output in 6 days despite being given enemas - started Mag Citrate & doubled the Miralax - D/c Carafate (for now) - has had multiple large BMs and is stable to d/c home    Adenocarcinoma of right lung, stage 4  With metastasis to abdomen (liver, Jejunum, omentum, adrenal glands and brain - patient would like to continue chemo with Dr Julien Nordmann - she has had a palliative care discussion and has declined hospice - I have discussed her case with palliative and case management - she will go home with home health  Mild leukopenia    - follow- cause uncertain  Klebsiella pneumoniae UTI - cont to treat for total 7 days- transitioned from Ceftriaxaone to Keflex  Seizure disorder Continue Keppra  History of DVT  Continue Lovenox at 1.5 mg/kg q24h  Hyponatremia - sodium 127 has improved to 132- cont to hold HCTZ  Episodes of SVT - noted on telemetry - added Metoprolol and subsequently resolved  HTN - added Metoprolol - holding Losartan & HCTZ  Procedures:   paracentesis   Discharge Exam: Vitals:   05/12/18 0349 05/12/18 0947  BP: 108/68 98/73  Pulse: 100 95  Resp: 18 16  Temp: 98.1 F (36.7 C)   SpO2: 91% 100%   Vitals:   05/11/18 2126 05/12/18 0349 05/12/18 0716 05/12/18 0947  BP: 99/63 108/68  98/73  Pulse: 92 100  95  Resp:  18  16  Temp:  98.1 F (36.7 C)    TempSrc:  Oral    SpO2:  91%  100%  Weight:   88.9 kg   Height:        General: Pt is alert, awake, not in acute distress Cardiovascular: RRR, S1/S2 +, no rubs, no gallops Respiratory: CTA bilaterally, no wheezing, no rhonchi Abdominal: Soft, NT, ND, bowel sounds + Extremities: no edema, no cyanosis   Discharge Instructions  Discharge Instructions    Diet - low sodium heart healthy   Complete by:  As directed     Increase activity slowly   Complete by:  As directed      Allergies as of 05/12/2018      Reactions   Other Rash   A chemo drug      Medication List    STOP taking these medications   hydrochlorothiazide 12.5 MG capsule Commonly known as:  MICROZIDE   losartan 50 MG tablet Commonly known as:  COZAAR   sucralfate 1 g tablet Commonly known as:  CARAFATE     TAKE these medications   acetaminophen 325 MG tablet Commonly known as:  TYLENOL Take 2 tablets (650 mg total) by mouth every 6 (six) hours as needed for mild pain, fever or headache (or Fever >/= 101).   ALPRAZolam 0.5 MG tablet Commonly known as:  XANAX Take 0.5 mg by mouth as needed for anxiety.   beta carotene w/minerals tablet Take 1 tablet by mouth daily.   calcium-vitamin D 500-200 MG-UNIT tablet Commonly known as:  OSCAL WITH D Take 1 tablet by mouth daily with breakfast.   cephALEXin 500 MG capsule Commonly known as:  KEFLEX Take 1 capsule (500 mg total) by mouth every 12 (twelve) hours.   dexamethasone 4 MG tablet Commonly known as:  DECADRON 4 mg by mouth twice a day the day before, day of and day after the chemotherapy every 3 weeks.   enoxaparin 120 MG/0.8ML injection Commonly known as:  LOVENOX Inject 120 mg into the skin daily.   feeding supplement Liqd Take 237 mLs by mouth AC breakfast. Takes occasionally   folic acid 1 MG tablet Commonly known as:  FOLVITE TAKE 1 TABLET BY MOUTH ONCE A DAY.   guaiFENesin 600 MG 12 hr tablet Commonly known as:  MUCINEX Take 1 tablet (600 mg total) by mouth 2 (two) times daily as needed.   levETIRAcetam 500 MG tablet Commonly known as:  KEPPRA Take 1 tablet (500 mg total) by mouth 2 (two) times daily.   lidocaine-prilocaine cream Commonly known as:  EMLA Apply 1 application topically as needed. Apply 1-2  tsp over port site 1.5 -2 hours prior to chemotherapy.   metoprolol tartrate 25 MG tablet Commonly known as:  LOPRESSOR Take 1 tablet (25  mg total) by mouth 2 (two) times daily.   multivitamin with minerals Tabs tablet Take 1 tablet by mouth daily.   neomycin-polymyxin-hydrocortisone 3.5-10000-1 ophthalmic suspension Commonly known as:  CORTISPORIN Place 2 drops 4 (four) times daily into both eyes.   omeprazole 40 MG capsule Commonly known as:  PRILOSEC Take 1 capsule (40 mg total) by mouth 2 (two) times daily. 30 minutes before breakfast and 30 minutes before dinner   ondansetron 4 MG tablet Commonly known as:  ZOFRAN Take 1 tablet (4 mg total) by mouth every 6 (six) hours as needed for nausea.   Oxycodone HCl 10 MG Tabs Take 1 tablet (10 mg total) by mouth every 6 (six) hours as needed for moderate pain.  polyethylene glycol packet Commonly known as:  MIRALAX / GLYCOLAX Take 17 g by mouth 2 (two) times daily.   prochlorperazine 10 MG tablet Commonly known as:  COMPAZINE Take 10 mg by mouth every 6 (six) hours as needed for nausea or vomiting.   senna-docusate 8.6-50 MG tablet Commonly known as:  Senokot-S Take 2 tablets by mouth at bedtime.            Durable Medical Equipment  (From admission, onward)         Start     Ordered   05/11/18 1242  For home use only DME Walker rolling  Once    Question:  Patient needs a walker to treat with the following condition  Answer:  Unsteady gait   05/11/18 1241   05/11/18 1242  For home use only DME 3 n 1  Once     05/11/18 [place] Follow up.   Why:  rolling walker, bedside commode Contact information: [address] [place] [address] [phone]        [facility], [facility] Follow up.   Specialty:  Home Health Services Why:  [facility] nursing/physical/occupational therapy/aide,social worker Contact information: [place] [address] [phone]          Allergies  Allergen Reactions  . Other Rash    A chemo drug      Procedures/Studies:    Ct Chest W Contrast  Result Date: 05/05/2018 CLINICAL DATA:  Abdominal distention and pain. Weight loss. Chemotherapy for metastatic lung cancer EXAM: CT CHEST, ABDOMEN, AND PELVIS WITH CONTRAST TECHNIQUE: Multidetector CT imaging of the chest, abdomen and pelvis was performed following the standard protocol during bolus administration of intravenous contrast. CONTRAST:  135mL ISOVUE-300 IOPAMIDOL (ISOVUE-300) INJECTION 61% COMPARISON:  02/25/2018 FINDINGS: CT CHEST FINDINGS Cardiovascular: Left Port-A-Cath tip: SVC. Atherosclerotic calcification of the aortic arch as well as the right and left anterior descending coronary arteries. Mediastinum/Nodes: Mild chronic stranding in the mediastinum without discrete adenopathy in the chest. Lungs/Pleura: Centrilobular emphysema. Bilateral central scarring in the lungs favoring radiation port. This includes a bandlike density in the left lower lobe which is chronic. Ground-glass density nodule in the superior segment left lower lobe 1.3 by 1.0 cm, formerly 1.4 by 1.1 cm. Subsegmental atelectasis in the lingula is increased compared to the prior exam. A second ground-glass nodular density in the left lower lobe on image 45/4 stable at about 1.0 cm in diameter. Musculoskeletal: Thoracic spondylosis. Right mastectomy with breast implant. CT ABDOMEN PELVIS FINDINGS Hepatobiliary: In addition to some pre-existing hepatic cysts, we demonstrate approximately 13 new solid masses in the liver compatible with metastatic disease, were not present on 02/25/2018. An index mass posteriorly in the lateral segment left hepatic lobe measures 3.2 by 2.9 cm (image 48/2). Cholecystectomy. Pancreas: Unremarkable Spleen: Unremarkable Adrenals/Urinary Tract: Unremarkable Stomach/Bowel: Abnormal focal thickening of the distal transverse colon is probably attributable to adjacent new omental tumor for example on image 57/2. There is also a markedly thickened  short segment of proximal jejunum about 5 cm in length shown on image 83/5, tumor involvement of this segment of the gene jejunum is a distinct possibility. A new omental nodule measuring 1.1 cm diameter on image 81/2 is compatible with tumor deposit. Other more subtle omental tumor deposits are present. Vascular/Lymphatic: Aortoiliac atherosclerotic vascular disease. Reproductive: Uterus absent.  Adnexa unremarkable. Other: Considerable new ascites, likely  malignant. Musculoskeletal: Subcutaneous edema along the anterior abdominal wall Fully segmental S1 level. 7 mm of degenerative anterolisthesis at L5-S1 with resulting bilateral foraminal impingement and at least moderate central narrowing of the thecal sac. IMPRESSION: 1. Numerous new hepatic masses compatible with metastatic lesions. 2. Masslike thickening of the proximal jejunum with adjacent nodularity, possibly from metastatic disease to the jejunum. 3. New omental tumor implants especially in the left upper quadrant along with moderate ascites compatible with peritoneal spread of tumor. There is tumor along the margin of a mildly thickened segment of distal transverse colon. 4. Stable ground-glass density nodules in the left lower lobe. 5. Other imaging findings of potential clinical significance: Aortic Atherosclerosis (ICD10-I70.0) and Emphysema (ICD10-J43.9). Coronary atherosclerosis. Subcutaneous edema along the anterior abdominal wall. 7 mm anterolisthesis at L5-S1 with central and foraminal impingement. Fully segmental S1 vertebral level. Electronically Signed   By: Van Clines M.D.   On: 05/05/2018 23:16   Mr Thoracic Spine W Wo Contrast  Result Date: 05/08/2018 CLINICAL DATA:  Back pain.  Metastatic lung cancer. EXAM: MRI THORACIC WITHOUT AND WITH CONTRAST TECHNIQUE: Multiplanar and multiecho pulse sequences of the thoracic spine were obtained without and with intravenous contrast. CONTRAST:  Gadavist 8.5 mL COMPARISON:  Chest CT  05/05/2018 FINDINGS: The patient was unable to remain motionless for the exam. Small or subtle lesions could be overlooked. MRI THORACIC SPINE FINDINGS Alignment:  Physiologic. Vertebrae: No fracture, evidence of discitis, or bone lesion. Scattered hemangiomata. Post infusion, no abnormal vertebral body is enhancement. Cord: Essentially normal signal and morphology. Slight deformity related to disc protrusion described below, with no abnormal signal. Paraspinal and other soft tissues: BILATERAL effusions. Ascites. Hepatic tumor and omental lesions poorly visualized. Disc levels: No compressive disc herniation except for T5-6 on the LEFT, where there is mild mass effect on the subarachnoid space and slight LEFT-sided cord flattening. No significant spinal stenosis. No abnormal cord signal. IMPRESSION: Suboptimal, motion degraded MRI of the thoracic spine without and with contrast demonstrates no definite areas of osseous metastatic disease. Slight cord deformity related to calcified T5-6 protrusion on the LEFT, but no stenosis or abnormal cord signal. No abnormal enhancement of the cord or intraspinal contents. Additional extra-spinal findings better detailed on CT chest abdomen pelvis, including BILATERAL pleural effusions and ascites, as well as visceral disease. Electronically Signed   By: Staci Righter M.D.   On: 05/08/2018 21:51   Ct Abdomen Pelvis W Contrast  Result Date: 05/05/2018 CLINICAL DATA:  Abdominal distention and pain. Weight loss. Chemotherapy for metastatic lung cancer EXAM: CT CHEST, ABDOMEN, AND PELVIS WITH CONTRAST TECHNIQUE: Multidetector CT imaging of the chest, abdomen and pelvis was performed following the standard protocol during bolus administration of intravenous contrast. CONTRAST:  14mL ISOVUE-300 IOPAMIDOL (ISOVUE-300) INJECTION 61% COMPARISON:  02/25/2018 FINDINGS: CT CHEST FINDINGS Cardiovascular: Left Port-A-Cath tip: SVC. Atherosclerotic calcification of the aortic arch as  well as the right and left anterior descending coronary arteries. Mediastinum/Nodes: Mild chronic stranding in the mediastinum without discrete adenopathy in the chest. Lungs/Pleura: Centrilobular emphysema. Bilateral central scarring in the lungs favoring radiation port. This includes a bandlike density in the left lower lobe which is chronic. Ground-glass density nodule in the superior segment left lower lobe 1.3 by 1.0 cm, formerly 1.4 by 1.1 cm. Subsegmental atelectasis in the lingula is increased compared to the prior exam. A second ground-glass nodular density in the left lower lobe on image 45/4 stable at about 1.0 cm in diameter. Musculoskeletal: Thoracic spondylosis. Right mastectomy with breast  implant. CT ABDOMEN PELVIS FINDINGS Hepatobiliary: In addition to some pre-existing hepatic cysts, we demonstrate approximately 13 new solid masses in the liver compatible with metastatic disease, were not present on 02/25/2018. An index mass posteriorly in the lateral segment left hepatic lobe measures 3.2 by 2.9 cm (image 48/2). Cholecystectomy. Pancreas: Unremarkable Spleen: Unremarkable Adrenals/Urinary Tract: Unremarkable Stomach/Bowel: Abnormal focal thickening of the distal transverse colon is probably attributable to adjacent new omental tumor for example on image 57/2. There is also a markedly thickened short segment of proximal jejunum about 5 cm in length shown on image 83/5, tumor involvement of this segment of the gene jejunum is a distinct possibility. A new omental nodule measuring 1.1 cm diameter on image 81/2 is compatible with tumor deposit. Other more subtle omental tumor deposits are present. Vascular/Lymphatic: Aortoiliac atherosclerotic vascular disease. Reproductive: Uterus absent.  Adnexa unremarkable. Other: Considerable new ascites, likely malignant. Musculoskeletal: Subcutaneous edema along the anterior abdominal wall Fully segmental S1 level. 7 mm of degenerative anterolisthesis at L5-S1  with resulting bilateral foraminal impingement and at least moderate central narrowing of the thecal sac. IMPRESSION: 1. Numerous new hepatic masses compatible with metastatic lesions. 2. Masslike thickening of the proximal jejunum with adjacent nodularity, possibly from metastatic disease to the jejunum. 3. New omental tumor implants especially in the left upper quadrant along with moderate ascites compatible with peritoneal spread of tumor. There is tumor along the margin of a mildly thickened segment of distal transverse colon. 4. Stable ground-glass density nodules in the left lower lobe. 5. Other imaging findings of potential clinical significance: Aortic Atherosclerosis (ICD10-I70.0) and Emphysema (ICD10-J43.9). Coronary atherosclerosis. Subcutaneous edema along the anterior abdominal wall. 7 mm anterolisthesis at L5-S1 with central and foraminal impingement. Fully segmental S1 vertebral level. Electronically Signed   By: Van Clines M.D.   On: 05/05/2018 23:16   US Abdomen Limited  Result Date: 05/10/2018 CLINICAL DATA:  77 year old female with recurrent ascites secondary to malignancy EXAM: LIMITED ABDOMEN ULTRASOUND FOR ASCITES TECHNIQUE: Limited ultrasound survey for ascites was performed in all four abdominal quadrants. COMPARISON:  None. FINDINGS: Ultrasound survey demonstrates small volume ascites. The patient discussed the case with the interventional team, and did not want paracentesis performed. IMPRESSION: Small volume ascites with no paracentesis performed. Electronically Signed   By: Corrie Mckusick D.O.   On: 05/10/2018 16:27   US Paracentesis  Result Date: 05/06/2018 INDICATION: Patient with prior history of breast cancer, stage IV non-small cell lung cancer, abdominal discomfort/distension, ascites. Request made for diagnostic and therapeutic paracentesis. EXAM: ULTRASOUND GUIDED DIAGNOSTIC AND THERAPEUTIC PARACENTESIS MEDICATIONS: None COMPLICATIONS: None immediate. PROCEDURE:  Informed written consent was obtained from the patient after a discussion of the risks, benefits and alternatives to treatment. A timeout was performed prior to the initiation of the procedure. Initial ultrasound scanning demonstrates a small to moderate amount of ascites within the left mid to lower abdominal quadrant. The left mid to lower abdomen was prepped and draped in the usual sterile fashion. 1% lidocaine was used for local anesthesia. Following this, a 19 gauge, 10-cm, Yueh catheter was introduced. An ultrasound image was saved for documentation purposes. The paracentesis was performed. The catheter was removed and a dressing was applied. The patient tolerated the procedure well without immediate post procedural complication. FINDINGS: A total of approximately 1.9 liters of hazy, amber fluid was removed. Samples were sent to the laboratory as requested by the clinical team. IMPRESSION: Successful ultrasound-guided diagnostic and therapeutic paracentesis yielding 1.9 liters of peritoneal fluid. Read by: Lennette Bihari  Allred, PA-C Electronically Signed   By: Jacqulynn Cadet M.D.   On: 05/06/2018 10:58   Dg Abd 2 Views  Result Date: 05/11/2018 CLINICAL DATA:  Shortness of breath, abdominal distension EXAM: ABDOMEN - 2 VIEW COMPARISON:  CT abdomen pelvis of 05/05/2017 FINDINGS: There is a moderately large amount of feces throughout the colon. No bowel obstruction is seen. On the left large decubitus of the abdomen no free air is seen. However there is opacity at the left lung base which may reflect atelectasis, but pneumonia cannot be excluded. No opaque calculi are seen. The bones are unremarkable. IMPRESSION: 1. Large amount of feces throughout the colon. No bowel obstruction. 2. No free air. 3. Opacity at the left lung base may reflect atelectasis or possibly pneumonia. Electronically Signed   By: Ivar Drape M.D.   On: 05/11/2018 13:12   Dg Abd Acute W/chest  Result Date: 05/08/2018 CLINICAL DATA:   Patient with abdominal distension. EXAM: DG ABDOMEN ACUTE W/ 1V CHEST COMPARISON:  Abdominal CT 05/05/2018 FINDINGS: Left anterior chest wall Port-A-Cath is present with tip projecting over the superior vena cava. The stable cardiac and mediastinal contours. Low lung volumes. Bibasilar atelectasis. Gas is demonstrated within nondilated loops of large and small bowel in a nonobstructed pattern. Large amount of stool throughout the colon. Decubitus view demonstrates no free intraperitoneal air. Cholecystectomy clips. IMPRESSION: Large amount of stool throughout the colon as can be seen with constipation. Nonobstructed bowel gas pattern. Low lung volumes with basilar atelectasis. Cardiomegaly. Electronically Signed   By: Lovey Newcomer M.D.   On: 05/08/2018 21:10     The results of significant diagnostics from this hospitalization (including imaging, microbiology, ancillary and laboratory) are listed below for reference.     Microbiology: Recent Results (from the past 240 hour(s))  Culture, Urine     Status: Abnormal   Collection Time: 05/06/18 12:48 AM  Result Value Ref Range Status   Specimen Description   Final    URINE, CLEAN CATCH Performed at [facility], [place] [address], [place], [place] [address]    Special Requests   Final    NONE Performed at [facility], [place] [address], [place],  [address]    Culture >=100,000 COLONIES/mL KLEBSIELLA PNEUMONIAE (A)  Final   Report Status 05/08/2018 FINAL  Final   Organism ID, Bacteria KLEBSIELLA PNEUMONIAE (A)  Final      Susceptibility   Klebsiella pneumoniae - MIC*    AMPICILLIN >=32 RESISTANT Resistant     CEFAZOLIN <=4 SENSITIVE Sensitive     CEFTRIAXONE <=1 SENSITIVE Sensitive     CIPROFLOXACIN <=0.25 SENSITIVE Sensitive     GENTAMICIN <=1 SENSITIVE Sensitive     IMIPENEM <=0.25 SENSITIVE Sensitive     NITROFURANTOIN 128 RESISTANT Resistant     TRIMETH/SULFA <=20 SENSITIVE Sensitive      AMPICILLIN/SULBACTAM 8 SENSITIVE Sensitive     PIP/TAZO <=4 SENSITIVE Sensitive     Extended ESBL NEGATIVE Sensitive     * >=100,000 COLONIES/mL KLEBSIELLA PNEUMONIAE  MRSA PCR Screening     Status: None   Collection Time: 05/06/18  1:51 AM  Result Value Ref Range Status   MRSA by PCR NEGATIVE NEGATIVE Final    Comment:        The GeneXpert MRSA Assay (FDA approved for NASAL specimens only), is one component of a comprehensive MRSA colonization surveillance program. It is not intended to diagnose MRSA infection nor to guide or monitor treatment for MRSA infections. Performed  at [facility], [place] [address], [place], [place] [address]   Culture, blood (routine x 2)     Status: None   Collection Time: 05/06/18  2:25 AM  Result Value Ref Range Status   Specimen Description   Final    BLOOD LEFT HAND Performed at [facility] [place] [address], [place], [place] [address]    Special Requests   Final    BOTTLES DRAWN AEROBIC AND ANAEROBIC Blood Culture adequate volume Performed at [place] [address] [place], [place] [address]    Culture   Final    NO GROWTH 5 DAYS Performed at [facility], [place] [address], [place], [place] [address]    Report Status 05/11/2018 FINAL  Final  Culture, blood (routine x 2)     Status: None   Collection Time: 05/06/18  2:25 AM  Result Value Ref Range Status   Specimen Description   Final    BLOOD LEFT ANTECUBITAL Performed at [place] [address], [place], [place] [address]    Special Requests   Final    BOTTLES DRAWN AEROBIC AND ANAEROBIC Blood Culture adequate volume Performed at [place] [address] [place], [place] [address]    Culture   Final    NO GROWTH 5 DAYS Performed at [facility], [place] [address], [place], [place] [address]    Report Status 05/11/2018 FINAL  Final  Body fluid culture     Status: None    Collection Time: 05/06/18 10:18 AM  Result Value Ref Range Status   Specimen Description   Final    PERITONEAL CAVITY Performed at [place] [address], [place], [place] [address]    Special Requests   Final    NONE Performed at [facility], [place] [address], [place], [place] [address]    Gram Stain   Final    MODERATE WBC PRESENT, PREDOMINANTLY PMN NO ORGANISMS SEEN    Culture   Final    NO GROWTH 3 DAYS Performed at [place] [address], [place], [place] [address]    Report Status 05/09/2018 FINAL  Final     Labs: BNP (last 3 results) No results for input(s): BNP in the last 8760 hours. Basic Metabolic Panel: Recent Labs  Lab 05/06/18 0225 05/07/18 0618 05/09/18 0203 05/10/18 1441 05/11/18 0403  NA 131* 132* 132* 127* 132*  K 3.4* 4.0 4.7 4.0 4.1  CL 100 100 98 93* 94*  CO2 24 25 25 26 30   GLUCOSE 125* 104* 120* 133* 113*  BUN 15 14 13 11 12   CREATININE 0.62 0.59 0.65 0.60 0.74  CALCIUM 8.5* 8.2* 8.3* 8.1* 8.8*  MG 1.8  --   --  1.7  --    Liver Function Tests: Recent Labs  Lab 05/05/18 1842 05/06/18 0225  AST 30 27  ALT 23 21  ALKPHOS 25* 25*  BILITOT 1.5* 1.6*  PROT 4.7* 4.8*  ALBUMIN 2.3* 2.4*   Recent Labs  Lab 05/05/18 1842  LIPASE 23   No results for input(s): AMMONIA in the last 168 hours. CBC: Recent Labs  Lab 05/05/18 1842 05/06/18 0225 05/11/18 0403  WBC 11.2* 10.4 2.0*  NEUTROABS  --  9.9* 1.0*  HGB 10.4* 10.5* 9.7*  HCT 31.0* 31.6* 28.7*  MCV 92.0 94.0 91.4  PLT 214 199 219   Cardiac Enzymes: Recent Labs  Lab  05/08/18 2000 05/09/18 0203 05/09/18 0805  TROPONINI <0.03 <0.03 <0.03   BNP: Invalid input(s): POCBNP CBG: No results for input(s): GLUCAP in the last 168 hours. D-Dimer No results for input(s): DDIMER in the last 72 hours. Hgb A1c No results for input(s): HGBA1C in the last 72 hours. Lipid Profile No results for input(s): CHOL, HDL, LDLCALC,  TRIG, CHOLHDL, LDLDIRECT in the last 72 hours. Thyroid function studies No results for input(s): TSH, T4TOTAL, T3FREE, THYROIDAB in the last 72 hours.  Invalid input(s): FREET3 Anemia work up No results for input(s): VITAMINB12, FOLATE, FERRITIN, TIBC, IRON, RETICCTPCT in the last 72 hours. Urinalysis    Component Value Date/Time   COLORURINE YELLOW 05/06/2018 0048   APPEARANCEUR CLEAR 05/06/2018 0048   LABSPEC 1.041 (H) 05/06/2018 0048   LABSPEC 1.010 10/16/2016 1530   PHURINE 5.0 05/06/2018 0048   GLUCOSEU NEGATIVE 05/06/2018 0048   GLUCOSEU Negative 10/16/2016 1530   HGBUR SMALL (A) 05/06/2018 0048   BILIRUBINUR NEGATIVE 05/06/2018 0048   BILIRUBINUR Negative 10/16/2016 1530   KETONESUR 5 (A) 05/06/2018 0048   PROTEINUR NEGATIVE 05/06/2018 0048   UROBILINOGEN 0.2 10/16/2016 1530   NITRITE NEGATIVE 05/06/2018 0048   LEUKOCYTESUR NEGATIVE 05/06/2018 0048   LEUKOCYTESUR Negative 10/16/2016 1530   Sepsis Labs Invalid input(s): PROCALCITONIN,  WBC,  LACTICIDVEN Microbiology Recent Results (from the past 240 hour(s))  Culture, Urine     Status: Abnormal   Collection Time: 05/06/18 12:48 AM  Result Value Ref Range Status   Specimen Description   Final    URINE, CLEAN CATCH Performed at [facility], [place] [address], [place], [place] [address]    Special Requests   Final    NONE Performed at [facility], [place] [address] [place], [place] [address]    Culture >=100,000 COLONIES/mL KLEBSIELLA PNEUMONIAE (A)  Final   Report Status 05/08/2018 FINAL  Final   Organism ID, Bacteria KLEBSIELLA PNEUMONIAE (A)  Final      Susceptibility   Klebsiella pneumoniae - MIC*    AMPICILLIN >=32 RESISTANT Resistant     CEFAZOLIN <=4 SENSITIVE Sensitive     CEFTRIAXONE <=1 SENSITIVE Sensitive     CIPROFLOXACIN <=0.25 SENSITIVE Sensitive     GENTAMICIN <=1 SENSITIVE Sensitive     IMIPENEM <=0.25 SENSITIVE Sensitive     NITROFURANTOIN 128 RESISTANT  Resistant     TRIMETH/SULFA <=20 SENSITIVE Sensitive     AMPICILLIN/SULBACTAM 8 SENSITIVE Sensitive     PIP/TAZO <=4 SENSITIVE Sensitive     Extended ESBL NEGATIVE Sensitive     * >=100,000 COLONIES/mL KLEBSIELLA PNEUMONIAE  MRSA PCR Screening     Status: None   Collection Time: 05/06/18  1:51 AM  Result Value Ref Range Status   MRSA by PCR NEGATIVE NEGATIVE Final    Comment:        The GeneXpert MRSA Assay (FDA approved for NASAL specimens only), is one component of a comprehensive MRSA colonization surveillance program. It is not intended to diagnose MRSA infection nor to guide or monitor treatment for MRSA infections. Performed at [facility], [place] [address] [place], [place] [address]   Culture, blood (routine x 2)     Status: None   Collection Time: 05/06/18  2:25 AM  Result Value Ref Range Status   Specimen Description   Final    BLOOD LEFT HAND Performed at [place] [address], [place], [place] [address]    Special Requests   Final  BOTTLES DRAWN AEROBIC AND ANAEROBIC Blood Culture adequate volume Performed at [place] [address], [place], [place] [address]    Culture   Final    NO GROWTH 5 DAYS Performed at [facility], [place] [address] [place], [place] [address]    Report Status 05/11/2018 FINAL  Final  Culture, blood (routine x 2)     Status: None   Collection Time: 05/06/18  2:25 AM  Result Value Ref Range Status   Specimen Description   Final    BLOOD LEFT ANTECUBITAL Performed at [place] [address], [place], [place] [address]    Special Requests   Final    BOTTLES DRAWN AEROBIC AND ANAEROBIC Blood Culture adequate volume Performed at [facility] [address] [place], [place] [address]    Culture   Final    NO GROWTH 5 DAYS Performed at [facility], [place] [address] [place], [place] [address]    Report Status  05/11/2018 FINAL  Final  Body fluid culture     Status: None   Collection Time: 05/06/18 10:18 AM  Result Value Ref Range Status   Specimen Description   Final    PERITONEAL CAVITY Performed at [place] [address] [place], [place] [address]    Special Requests   Final    NONE Performed at [facility], [place] [address] [place], [place] [address]    Gram Stain   Final    MODERATE WBC PRESENT, PREDOMINANTLY PMN NO ORGANISMS SEEN    Culture   Final    NO GROWTH 3 DAYS Performed at [place] [address] [place], [place] [address]    Report Status 05/09/2018 FINAL  Final     Time coordinating discharge in minutes: 60  SIGNED:   Debbe Odea, MD  [facility] 05/12/2018, 12:07 PM Pager   If 7PM-7AM, please contact night-coverage [email or web address] Password [email or web address]

## 2018-05-12 NOTE — Progress Notes (Signed)
Patient complained of dizziness/light headedness while getting out of the bed to use the bedside commode. Pt sat for 5 minutes and after that time attempted to get back to bed when pt reported feeling light headed. Will update provider manual BP 96/62.

## 2018-05-12 NOTE — Progress Notes (Signed)
Patient continues to have uncontrolled Loose stools along with c/o dizziness. BP 92/60.  Provider updated, V.O received for NS bolus 500 cc. Will hold discharge for this evening.

## 2018-05-13 LAB — BASIC METABOLIC PANEL
Anion gap: 9 (ref 5–15)
BUN: 15 mg/dL (ref 8–23)
CHLORIDE: 93 mmol/L — AB (ref 98–111)
CO2: 28 mmol/L (ref 22–32)
Calcium: 8.6 mg/dL — ABNORMAL LOW (ref 8.9–10.3)
Creatinine, Ser: 0.81 mg/dL (ref 0.44–1.00)
GFR calc Af Amer: >60 mL/min (ref >60)
GFR calc non Af Amer: >60 mL/min (ref >60)
Glucose, Bld: 146 mg/dL — ABNORMAL HIGH (ref 70–99)
POTASSIUM: 4.3 mmol/L (ref 3.5–5.1)
SODIUM: 130 mmol/L — AB (ref 135–145)

## 2018-05-13 LAB — CBC
HEMATOCRIT: 28.5 % — AB (ref 36.0–46.0)
Hemoglobin: 9.4 g/dL — ABNORMAL LOW (ref 12.0–15.0)
MCH: 30.6 pg (ref 26.0–34.0)
MCHC: 33 g/dL (ref 30.0–36.0)
MCV: 92.8 fL (ref 80.0–100.0)
NRBC: 1.2 % — AB (ref 0.0–0.2)
Platelets: 261 10*3/uL (ref 150–400)
RBC: 3.07 MIL/uL — ABNORMAL LOW (ref 3.87–5.11)
RDW: 15.5 % (ref 11.5–15.5)
WBC: 5.1 10*3/uL (ref 4.0–10.5)

## 2018-05-13 MED ORDER — SODIUM CHLORIDE 0.9 % IV BOLUS: 500.0000 mL | Freq: Once | INTRAVENOUS | Status: DC

## 2018-05-13 MED ORDER — METOPROLOL TARTRATE 5 MG/5ML IV SOLN: 5.0000 mg | Freq: Once | INTRAVENOUS | Status: DC

## 2018-05-13 MED ORDER — HEPARIN SOD (PORK) LOCK FLUSH 100 UNIT/ML IV SOLN
500.0000 [IU] | INTRAVENOUS | Status: AC | PRN
  Administered 2018-05-13: 500 [IU]

## 2018-05-13 NOTE — Progress Notes (Signed)
Discharge instructions reviewed. Questions concerns denied. Transport arranged [facility]AR

## 2018-05-13 NOTE — Progress Notes (Signed)
Patient was stable at time of discharge. Prior to dc pt voided x2, total 300.

## 2018-05-13 NOTE — Progress Notes (Signed)
Patient HR elevated to 160-170 sustaining. N.P informed and ordered 538ml NS bolus and Lopressor 5mg  IV. Before RN administer medication Patient HR went back to SR 90-100. Informed NP again and N.P. D/C'd medication. Will continue to monitor pt.

## 2018-05-15 ENCOUNTER — Encounter (HOSPITAL_COMMUNITY): Payer: Self-pay | Admitting: Emergency Medicine

## 2018-05-15 ENCOUNTER — Emergency Department (HOSPITAL_COMMUNITY): Payer: Medicare Other

## 2018-05-15 ENCOUNTER — Inpatient Hospital Stay (HOSPITAL_COMMUNITY)
Admission: EM | Admit: 2018-05-15 | Discharge: 2018-05-18 | DRG: 375 | Disposition: A | Discharge status: Hospice | Payer: Medicare Other | Attending: Family Medicine | Admitting: Family Medicine

## 2018-05-15 ENCOUNTER — Other Ambulatory Visit: Payer: Self-pay

## 2018-05-15 DIAGNOSIS — F419 Anxiety disorder, unspecified: Secondary | ICD-10-CM | POA: Diagnosis present

## 2018-05-15 DIAGNOSIS — R339 Retention of urine, unspecified: Secondary | ICD-10-CM | POA: Diagnosis present

## 2018-05-15 DIAGNOSIS — C799 Secondary malignant neoplasm of unspecified site: Secondary | ICD-10-CM

## 2018-05-15 DIAGNOSIS — G629 Polyneuropathy, unspecified: Secondary | ICD-10-CM | POA: Diagnosis present

## 2018-05-15 DIAGNOSIS — Z86718 Personal history of other venous thrombosis and embolism: Secondary | ICD-10-CM

## 2018-05-15 DIAGNOSIS — Z7189 Other specified counseling: Secondary | ICD-10-CM | POA: Diagnosis not present

## 2018-05-15 DIAGNOSIS — R402363 Coma scale, best motor response, obeys commands, at hospital admission: Secondary | ICD-10-CM | POA: Diagnosis present

## 2018-05-15 DIAGNOSIS — C7931 Secondary malignant neoplasm of brain: Secondary | ICD-10-CM | POA: Diagnosis present

## 2018-05-15 DIAGNOSIS — R0602 Shortness of breath: Secondary | ICD-10-CM

## 2018-05-15 DIAGNOSIS — T8189XA Other complications of procedures, not elsewhere classified, initial encounter: Secondary | ICD-10-CM | POA: Diagnosis not present

## 2018-05-15 DIAGNOSIS — Z7952 Long term (current) use of systemic steroids: Secondary | ICD-10-CM

## 2018-05-15 DIAGNOSIS — R338 Other retention of urine: Secondary | ICD-10-CM

## 2018-05-15 DIAGNOSIS — C800 Disseminated malignant neoplasm, unspecified: Secondary | ICD-10-CM | POA: Diagnosis present

## 2018-05-15 DIAGNOSIS — G893 Neoplasm related pain (acute) (chronic): Secondary | ICD-10-CM | POA: Diagnosis present

## 2018-05-15 DIAGNOSIS — Y838 Other surgical procedures as the cause of abnormal reaction of the patient, or of later complication, without mention of misadventure at the time of the procedure: Secondary | ICD-10-CM | POA: Diagnosis not present

## 2018-05-15 DIAGNOSIS — R180 Malignant ascites: Secondary | ICD-10-CM

## 2018-05-15 DIAGNOSIS — R402253 Coma scale, best verbal response, oriented, at hospital admission: Secondary | ICD-10-CM | POA: Diagnosis present

## 2018-05-15 DIAGNOSIS — Z9049 Acquired absence of other specified parts of digestive tract: Secondary | ICD-10-CM

## 2018-05-15 DIAGNOSIS — Z171 Estrogen receptor negative status [ER-]: Secondary | ICD-10-CM | POA: Diagnosis not present

## 2018-05-15 DIAGNOSIS — I10 Essential (primary) hypertension: Secondary | ICD-10-CM | POA: Diagnosis present

## 2018-05-15 DIAGNOSIS — Z515 Encounter for palliative care: Secondary | ICD-10-CM | POA: Diagnosis not present

## 2018-05-15 DIAGNOSIS — Z888 Allergy status to other drugs, medicaments and biological substances status: Secondary | ICD-10-CM

## 2018-05-15 DIAGNOSIS — Z7901 Long term (current) use of anticoagulants: Secondary | ICD-10-CM

## 2018-05-15 DIAGNOSIS — K219 Gastro-esophageal reflux disease without esophagitis: Secondary | ICD-10-CM | POA: Diagnosis present

## 2018-05-15 DIAGNOSIS — I4891 Unspecified atrial fibrillation: Secondary | ICD-10-CM | POA: Diagnosis present

## 2018-05-15 DIAGNOSIS — C50911 Malignant neoplasm of unspecified site of right female breast: Secondary | ICD-10-CM

## 2018-05-15 DIAGNOSIS — Z87440 Personal history of urinary (tract) infections: Secondary | ICD-10-CM

## 2018-05-15 DIAGNOSIS — Z923 Personal history of irradiation: Secondary | ICD-10-CM

## 2018-05-15 DIAGNOSIS — K5909 Other constipation: Secondary | ICD-10-CM | POA: Diagnosis not present

## 2018-05-15 DIAGNOSIS — C7989 Secondary malignant neoplasm of other specified sites: Secondary | ICD-10-CM | POA: Diagnosis present

## 2018-05-15 DIAGNOSIS — E871 Hypo-osmolality and hyponatremia: Secondary | ICD-10-CM

## 2018-05-15 DIAGNOSIS — Z8719 Personal history of other diseases of the digestive system: Secondary | ICD-10-CM

## 2018-05-15 DIAGNOSIS — Z79899 Other long term (current) drug therapy: Secondary | ICD-10-CM

## 2018-05-15 DIAGNOSIS — C787 Secondary malignant neoplasm of liver and intrahepatic bile duct: Secondary | ICD-10-CM | POA: Diagnosis present

## 2018-05-15 DIAGNOSIS — Z87891 Personal history of nicotine dependence: Secondary | ICD-10-CM

## 2018-05-15 DIAGNOSIS — I452 Bifascicular block: Secondary | ICD-10-CM | POA: Diagnosis present

## 2018-05-15 DIAGNOSIS — C786 Secondary malignant neoplasm of retroperitoneum and peritoneum: Principal | ICD-10-CM | POA: Diagnosis present

## 2018-05-15 DIAGNOSIS — Z66 Do not resuscitate: Secondary | ICD-10-CM | POA: Diagnosis present

## 2018-05-15 DIAGNOSIS — Z90710 Acquired absence of both cervix and uterus: Secondary | ICD-10-CM

## 2018-05-15 DIAGNOSIS — Z95828 Presence of other vascular implants and grafts: Secondary | ICD-10-CM

## 2018-05-15 DIAGNOSIS — E861 Hypovolemia: Secondary | ICD-10-CM

## 2018-05-15 DIAGNOSIS — Z9011 Acquired absence of right breast and nipple: Secondary | ICD-10-CM

## 2018-05-15 DIAGNOSIS — R402143 Coma scale, eyes open, spontaneous, at hospital admission: Secondary | ICD-10-CM | POA: Diagnosis present

## 2018-05-15 DIAGNOSIS — Z8711 Personal history of peptic ulcer disease: Secondary | ICD-10-CM

## 2018-05-15 DIAGNOSIS — Z9221 Personal history of antineoplastic chemotherapy: Secondary | ICD-10-CM

## 2018-05-15 DIAGNOSIS — I9589 Other hypotension: Secondary | ICD-10-CM | POA: Diagnosis present

## 2018-05-15 DIAGNOSIS — Z792 Long term (current) use of antibiotics: Secondary | ICD-10-CM

## 2018-05-15 DIAGNOSIS — Z8619 Personal history of other infectious and parasitic diseases: Secondary | ICD-10-CM

## 2018-05-15 DIAGNOSIS — G4733 Obstructive sleep apnea (adult) (pediatric): Secondary | ICD-10-CM | POA: Diagnosis present

## 2018-05-15 DIAGNOSIS — E8809 Other disorders of plasma-protein metabolism, not elsewhere classified: Secondary | ICD-10-CM | POA: Diagnosis present

## 2018-05-15 DIAGNOSIS — Z85828 Personal history of other malignant neoplasm of skin: Secondary | ICD-10-CM

## 2018-05-15 DIAGNOSIS — C3491 Malignant neoplasm of unspecified part of right bronchus or lung: Secondary | ICD-10-CM | POA: Diagnosis not present

## 2018-05-15 DIAGNOSIS — Z86010 Personal history of colonic polyps: Secondary | ICD-10-CM

## 2018-05-15 DIAGNOSIS — Z853 Personal history of malignant neoplasm of breast: Secondary | ICD-10-CM

## 2018-05-15 DIAGNOSIS — I479 Paroxysmal tachycardia, unspecified: Secondary | ICD-10-CM

## 2018-05-15 DIAGNOSIS — I959 Hypotension, unspecified: Secondary | ICD-10-CM | POA: Diagnosis present

## 2018-05-15 HISTORY — DX: Malignant ascites: R18.0

## 2018-05-15 HISTORY — DX: Spontaneous bacterial peritonitis: K65.2

## 2018-05-15 LAB — BASIC METABOLIC PANEL
ANION GAP: 9 (ref 5–15)
BUN: 20 mg/dL (ref 8–23)
CALCIUM: 8.3 mg/dL — AB (ref 8.9–10.3)
CHLORIDE: 90 mmol/L — AB (ref 98–111)
CO2: 25 mmol/L (ref 22–32)
Creatinine, Ser: 0.98 mg/dL (ref 0.44–1.00)
GFR calc Af Amer: >60 mL/min (ref >60)
GFR calc non Af Amer: 55 mL/min — ABNORMAL LOW (ref >60)
Glucose, Bld: 185 mg/dL — ABNORMAL HIGH (ref 70–99)
Potassium: 4.5 mmol/L (ref 3.5–5.1)
Sodium: 124 mmol/L — ABNORMAL LOW (ref 135–145)

## 2018-05-15 LAB — URINALYSIS, ROUTINE W REFLEX MICROSCOPIC
Bilirubin Urine: NEGATIVE
GLUCOSE, UA: NEGATIVE mg/dL
Hgb urine dipstick: NEGATIVE
KETONES UR: NEGATIVE mg/dL
LEUKOCYTES UA: NEGATIVE
Nitrite: NEGATIVE
PROTEIN: NEGATIVE mg/dL
Specific Gravity, Urine: 1.024 (ref 1.005–1.030)
pH: 5 (ref 5.0–8.0)

## 2018-05-15 LAB — PROTIME-INR
INR: 1.15
PROTHROMBIN TIME: 14.6 s (ref 11.4–15.2)

## 2018-05-15 LAB — BRAIN NATRIURETIC PEPTIDE: B NATRIURETIC PEPTIDE 5: 123 pg/mL — AB (ref 0.0–100.0)

## 2018-05-15 LAB — CBC WITH DIFFERENTIAL/PLATELET
ABS IMMATURE GRANULOCYTES: 0.25 10*3/uL — AB (ref 0.00–0.07)
BASOS PCT: 0 %
Basophils Absolute: 0 10*3/uL (ref 0.0–0.1)
EOS ABS: 0 10*3/uL (ref 0.0–0.5)
Eosinophils Relative: 0 %
HCT: 32.9 % — ABNORMAL LOW (ref 36.0–46.0)
Hemoglobin: 10.7 g/dL — ABNORMAL LOW (ref 12.0–15.0)
Immature Granulocytes: 2 %
Lymphocytes Relative: 1 %
Lymphs Abs: 0.2 10*3/uL — ABNORMAL LOW (ref 0.7–4.0)
MCH: 30.4 pg (ref 26.0–34.0)
MCHC: 32.5 g/dL (ref 30.0–36.0)
MCV: 93.5 fL (ref 80.0–100.0)
Monocytes Absolute: 1.6 10*3/uL — ABNORMAL HIGH (ref 0.1–1.0)
Monocytes Relative: 12 %
NEUTROS ABS: 10.8 10*3/uL — AB (ref 1.7–7.7)
NEUTROS PCT: 85 %
PLATELETS: 286 10*3/uL (ref 150–400)
RBC: 3.52 MIL/uL — AB (ref 3.87–5.11)
RDW: 17.1 % — AB (ref 11.5–15.5)
WBC: 12.8 10*3/uL — AB (ref 4.0–10.5)
nRBC: 0.2 % (ref 0.0–0.2)

## 2018-05-15 LAB — LACTIC ACID, PLASMA
Lactic Acid, Venous: 1.7 mmol/L (ref 0.5–1.9)
Lactic Acid, Venous: 1.9 mmol/L (ref 0.5–1.9)

## 2018-05-15 LAB — APTT: APTT: 56 s — AB (ref 24–36)

## 2018-05-15 LAB — TROPONIN I: Troponin I: <0.03 ng/mL (ref <0.03)

## 2018-05-15 MED ORDER — ACETAMINOPHEN 650 MG RE SUPP: 650.0000 mg | Freq: Four times a day (QID) | RECTAL | Status: DC | PRN

## 2018-05-15 MED ORDER — OXYCODONE HCL 5 MG PO TABS
10.0000 mg | ORAL_TABLET | Freq: Four times a day (QID) | ORAL | Status: DC | PRN
  Administered 2018-05-15 – 2018-05-17 (×3): 10 mg via ORAL
  Filled 2018-05-15 (×3): qty 2

## 2018-05-15 MED ORDER — SODIUM CHLORIDE 0.9 % IV BOLUS
1000.0000 mL | Freq: Once | INTRAVENOUS | Status: AC
  Administered 2018-05-15: 1000 mL via INTRAVENOUS

## 2018-05-15 MED ORDER — MAGNESIUM CITRATE PO SOLN: 1.0000 | Freq: Once | ORAL | Status: DC | PRN

## 2018-05-15 MED ORDER — ENOXAPARIN SODIUM 120 MG/0.8ML ~~LOC~~ SOLN
120.0000 mg | SUBCUTANEOUS | Status: DC
  Administered 2018-05-16 – 2018-05-17 (×3): 120 mg via SUBCUTANEOUS
  Filled 2018-05-15 (×3): qty 0.8

## 2018-05-15 MED ORDER — ONDANSETRON HCL 4 MG PO TABS: 4.0000 mg | ORAL_TABLET | Freq: Four times a day (QID) | ORAL | Status: DC | PRN

## 2018-05-15 MED ORDER — SODIUM CHLORIDE 0.9 % IV SOLN
INTRAVENOUS | Status: DC
  Administered 2018-05-15 – 2018-05-18 (×4): via INTRAVENOUS

## 2018-05-15 MED ORDER — SODIUM CHLORIDE 0.9 % IV SOLN
INTRAVENOUS | Status: DC
  Administered 2018-05-15: 11:00:00 via INTRAVENOUS

## 2018-05-15 MED ORDER — ACETAMINOPHEN 325 MG PO TABS: 650.0000 mg | ORAL_TABLET | Freq: Four times a day (QID) | ORAL | Status: DC | PRN

## 2018-05-15 MED ORDER — HYDROMORPHONE HCL 1 MG/ML IJ SOLN
0.5000 mg | Freq: Four times a day (QID) | INTRAMUSCULAR | Status: DC | PRN
  Administered 2018-05-15 – 2018-05-16 (×3): 0.5 mg via INTRAVENOUS
  Filled 2018-05-15 (×3): qty 0.5

## 2018-05-15 MED ORDER — PANTOPRAZOLE SODIUM 40 MG PO TBEC
40.0000 mg | DELAYED_RELEASE_TABLET | Freq: Every day | ORAL | Status: DC
  Administered 2018-05-16 – 2018-05-18 (×3): 40 mg via ORAL
  Filled 2018-05-15 (×3): qty 1

## 2018-05-15 MED ORDER — ONDANSETRON HCL 4 MG/2ML IJ SOLN: 4.0000 mg | Freq: Four times a day (QID) | INTRAMUSCULAR | Status: DC | PRN

## 2018-05-15 MED ORDER — SODIUM CHLORIDE 0.9 % IV BOLUS
500.0000 mL | Freq: Once | INTRAVENOUS | Status: AC
  Administered 2018-05-15: 16:00:00 via INTRAVENOUS

## 2018-05-15 MED ORDER — POLYETHYLENE GLYCOL 3350 17 G PO PACK
17.0000 g | PACK | Freq: Two times a day (BID) | ORAL | Status: DC
  Administered 2018-05-16 – 2018-05-18 (×5): 17 g via ORAL
  Filled 2018-05-15 (×6): qty 1

## 2018-05-15 MED ORDER — SODIUM CHLORIDE 0.9 % IV BOLUS
500.0000 mL | Freq: Once | INTRAVENOUS | Status: AC
  Administered 2018-05-15: 500 mL via INTRAVENOUS

## 2018-05-15 MED ORDER — SENNOSIDES-DOCUSATE SODIUM 8.6-50 MG PO TABS
2.0000 | ORAL_TABLET | Freq: Every day | ORAL | Status: DC
  Administered 2018-05-16 – 2018-05-17 (×2): 2 via ORAL
  Filled 2018-05-15 (×3): qty 2

## 2018-05-15 MED ORDER — ENSURE ENLIVE PO LIQD
237.0000 mL | Freq: Every day | ORAL | Status: DC
  Administered 2018-05-16 – 2018-05-18 (×3): 237 mL via ORAL

## 2018-05-15 MED ORDER — LEVETIRACETAM 500 MG PO TABS
500.0000 mg | ORAL_TABLET | Freq: Two times a day (BID) | ORAL | Status: DC
  Administered 2018-05-16 – 2018-05-18 (×6): 500 mg via ORAL
  Filled 2018-05-15 (×6): qty 1

## 2018-05-15 NOTE — ED Triage Notes (Signed)
Ems called out for sob .  Pt 84% on ra upon ems arrival.  afib rvr with rate from 160-190's.  Pt is awake and alert at this time, o2 97% on nrb @15l 

## 2018-05-15 NOTE — ED Notes (Signed)
Verbal order from Dr Jerilee Hoh to insert indwelling foley catheter d/t urinary retention.
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
## 2018-05-15 NOTE — ED Notes (Signed)
edp notified of pt's bp
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
## 2018-05-16 ENCOUNTER — Inpatient Hospital Stay (HOSPITAL_COMMUNITY): Payer: Medicare Other

## 2018-05-16 ENCOUNTER — Other Ambulatory Visit: Payer: Self-pay

## 2018-05-16 LAB — COMPREHENSIVE METABOLIC PANEL
ALT: 14 U/L (ref 0–44)
AST: 19 U/L (ref 15–41)
Albumin: 1.8 g/dL — ABNORMAL LOW (ref 3.5–5.0)
Alkaline Phosphatase: 26 U/L — ABNORMAL LOW (ref 38–126)
Anion gap: 7 (ref 5–15)
BILIRUBIN TOTAL: 0.7 mg/dL (ref 0.3–1.2)
BUN: 21 mg/dL (ref 8–23)
CO2: 25 mmol/L (ref 22–32)
Calcium: 8.3 mg/dL — ABNORMAL LOW (ref 8.9–10.3)
Chloride: 97 mmol/L — ABNORMAL LOW (ref 98–111)
Creatinine, Ser: 0.81 mg/dL (ref 0.44–1.00)
GFR calc non Af Amer: >60 mL/min (ref >60)
Glucose, Bld: 105 mg/dL — ABNORMAL HIGH (ref 70–99)
POTASSIUM: 4.5 mmol/L (ref 3.5–5.1)
Sodium: 129 mmol/L — ABNORMAL LOW (ref 135–145)
TOTAL PROTEIN: 4.7 g/dL — AB (ref 6.5–8.1)

## 2018-05-16 LAB — CBC
HCT: 31.8 % — ABNORMAL LOW (ref 36.0–46.0)
HEMOGLOBIN: 10.3 g/dL — AB (ref 12.0–15.0)
MCH: 31 pg (ref 26.0–34.0)
MCHC: 32.4 g/dL (ref 30.0–36.0)
MCV: 95.8 fL (ref 80.0–100.0)
NRBC: 0 % (ref 0.0–0.2)
Platelets: 295 10*3/uL (ref 150–400)
RBC: 3.32 MIL/uL — ABNORMAL LOW (ref 3.87–5.11)
RDW: 17.5 % — AB (ref 11.5–15.5)
WBC: 14.1 10*3/uL — AB (ref 4.0–10.5)

## 2018-05-16 LAB — MRSA PCR SCREENING: MRSA by PCR: NEGATIVE

## 2018-05-16 LAB — URINE CULTURE: Culture: NO GROWTH

## 2018-05-16 LAB — BODY FLUID CELL COUNT WITH DIFFERENTIAL
Eos, Fluid: 0 %
Lymphs, Fluid: 52 %
Monocyte-Macrophage-Serous Fluid: 27 % — ABNORMAL LOW (ref 50–90)
NEUTROPHIL FLUID: 21 % (ref 0–25)
OTHER CELLS FL: 0 %
Total Nucleated Cell Count, Fluid: 1027 cu mm — ABNORMAL HIGH (ref 0–1000)

## 2018-05-16 LAB — GRAM STAIN

## 2018-05-16 MED ORDER — HYDROMORPHONE HCL 1 MG/ML IJ SOLN
0.5000 mg | INTRAMUSCULAR | Status: AC | PRN
  Administered 2018-05-16 – 2018-05-18 (×3): 0.5 mg via INTRAVENOUS
  Filled 2018-05-16 (×3): qty 0.5

## 2018-05-16 NOTE — Progress Notes (Signed)
No charge note:   Palliative consult received.   Left message for [facility] requesting to meet and discuss GOC. Suggested meeting time of 10am tomorrow 10/22. Requested return call.  Mariana Kaufman, AGNP-C Palliative Medicine  Please call Palliative Medicine team phone with any questions [phone]. For individual providers please see AMION.

## 2018-05-16 NOTE — Progress Notes (Signed)
Paracentesis complete no signs of distress.  

## 2018-05-16 NOTE — Progress Notes (Signed)
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 patient sign their own consent?    Answer:   Yes    Order Specific Question:   Is the patient on any bloodthinners?    Answer:   Yes    Order Specific Question:   Symptom/Reason for Exam    Answer:   Malignant ascites [789.51.ICD-9-CM]  . Basic metabolic panel    Standing Status:   Standing    Number of Occurrences:   1  . Brain natriuretic peptide    Standing Status:   Standing    Number of Occurrences:   1  . Troponin I    Standing Status:   Standing    Number of Occurrences:   1  . CBC with Differential    Standing Status:   Standing    Number of Occurrences:   1  . Protime-INR    Standing Status:   Standing    Number of Occurrences:   1  . APTT    Standing Status:   Standing    Number of Occurrences:   1  . Urinalysis, Routine w reflex microscopic    Standing Status:   Standing    Number of Occurrences:   1  . Lactic acid, plasma    Standing Status:   Standing    Number of Occurrences:   2  . Comprehensive metabolic panel    Standing Status:    Standing    Number of Occurrences:   1  . CBC    Standing Status:   Standing    Number of Occurrences:   1  . Diet regular Room service appropriate? Yes; Fluid consistency: Thin    Standing Status:   Standing    Number of Occurrences:   1    Order Specific Question:   Room service appropriate?    Answer:   Yes    Order Specific Question:   Fluid consistency:    Answer:   Thin  . In and Out Cath    Standing Status:   Standing    Number of Occurrences:   1  . In and Out Cath    Standing Status:   Standing    Number of Occurrences:   1  . Vital signs    Standing Status:   Standing    Number of Occurrences:   1  . Notify physician    Standing Status:   Standing    Number of Occurrences:   20    Order Specific Question:   Notify Physician    Answer:   for pulse less than 55 or greater than 120    Order Specific Question:   Notify Physician    Answer:   for respiratory rate less than 12 or greater than 25    Order Specific Question:   Notify Physician    Answer:   for temperature greater than 100.5 F    Order Specific Question:   Notify Physician    Answer:  for urinary output less than 30 mL/kg/hr for four hours    Order Specific Question:   Notify Physician    Answer:   for systolic BP less than 90 or greater than 767, diastolic BP less than 60 or greater than 100  . Initiate Oral Care Protocol    Standing Status:   Standing    Number of Occurrences:   1  . Initiate Carrier Fluid Protocol    Standing Status:   Standing    Number of Occurrences:   1  . RN may order General Admission PRN Orders utilizing "General Admission PRN medications" (through manage orders) for the following patient needs: allergy symptoms (Claritin), cold sores (Carmex), cough (Robitussin DM), eye irritation (Liquifilm Tears), hemorrhoids (Tucks), indigestion (Maalox), minor skin irritation (Hydrocortisone Cream), muscle pain Suezanne Jacquet Gay), nose irritation (saline nasal spray) and sore throat (Chloraseptic  spray).    Standing Status:   Standing    Number of Occurrences:   [id number]  . Patient has an active order for admit to inpatient/place in observation    Standing Status:   Standing    Number of Occurrences:   1  . Insert foley catheter    Standing Status:   Standing    Number of Occurrences:   1  . Do not attempt resuscitation (DNR)    Standing Status:   Standing    Number of Occurrences:   1    Order Specific Question:   In the event of cardiac or respiratory ARREST    Answer:   Do not call a "code blue"    Order Specific Question:   In the event of cardiac or respiratory ARREST    Answer:   Do not perform Intubation, CPR, defibrillation or ACLS    Order Specific Question:   In the event of cardiac or respiratory ARREST    Answer:   Use medication by any route, position, wound care, and other measures to relive pain and suffering. May use oxygen, suction and manual treatment of airway obstruction as needed for comfort.  . Consult to palliative care    Standing Status:   Standing    Number of Occurrences:   1    Order Specific Question:   Palliative Care Consult Services    Answer:   Palliative Medicine Consult    Order Specific Question:   Reason for Consult?    Answer:   GOC  . Consult to oncology Consult Timeframe: ROUTINE - requires response within 24 hours; Reason for Consult? ADvanced metastatic lung cancer    Standing Status:   Standing    Number of Occurrences:   1    Order Specific Question:   Consult Timeframe    Answer:   ROUTINE - requires response within 24 hours    Order Specific Question:   Reason for Consult?    Answer:   ADvanced metastatic lung cancer  . ED EKG    Standing Status:   Standing    Number of Occurrences:   1    Order Specific Question:   Reason for Exam    Answer:   Shortness of breath  . Admit to Inpatient (patient's expected length of stay will be greater than 2 midnights or inpatient only procedure)    Standing Status:   Standing    Number of  Occurrences:   1    Order Specific Question:   Hospital Area    Answer:   [facility] [[id number]]    Order  Specific Question:   Level of Care    Answer:   Stepdown [14]    Order Specific Question:   Diagnosis    Answer:   Hypotension [364680]    Order Specific Question:   Admitting Physician    Answer:   Erline Hau [[id number]]    Order Specific Question:   Attending Physician    Answer:   Erline Hau [[id number]]    Order Specific Question:   Estimated length of stay    Answer:   3 - 4 days    Order Specific Question:   Certification:    Answer:   I certify this patient will need inpatient services for at least 2 midnights    Order Specific Question:   PT Class (Do Not Modify)    Answer:   Inpatient [101]    Order Specific Question:   PT Acc Code (Do Not Modify)    Answer:   Private [1]

## 2018-05-16 NOTE — Progress Notes (Signed)
PROGRESS NOTE    Brynnley Dayrit [facility]  ZOX:[id number] DOB: [birth year] DOA: 05/15/2018 PCP: [name], MD     Brief Narrative:  77 year old woman admitted from home on 10/20 due to shortness of breath and difficulty urinating.  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  She had a recent hospitalization at [facility] at which time she had paracentesis and was diagnosed with SBP and completed a course of Rocephin.  She returns today, she seems surprised about the findings of her CT scan.  She is being admitted due to her hypotension and urinary retention and need for repeat paracentesis for pain management.   Assessment & Plan:   Principal Problem:   Hypotension Active Problems:   Adenocarcinoma of right lung, stage 4 (HCC)   Carcinoma of breast, stage 2, estrogen receptor negative, right (HCC)   GERD (gastroesophageal reflux disease)   DNR (do not resuscitate) discussion   Personal history of DVT (deep vein thrombosis)   Hyponatremia   Malignant ascites   Acute urinary retention   Hypotension -Suspect hypovolemic in the face of malignant ascites and significant third spacing of fluids.  -No source of infection has been identified. -Culture data remains negative at this time, she does not meet SIRS criteria. -With IV fluids overnight her blood pressure has improved to around 90 to low 811 systolic. -After discussion with patient and family we would not pursue pressors at this time.  Stage IV adenocarcinoma of the lung -Is quite advanced as per her recent CT scan. -She is leaning towards hospice care at this time.  Discussed case with [name] who in turn discussed with [name] and they have both determined that no further treatment options are available to her. -Palliative care goals of care discussion with family has been scheduled for 10/22.  Malignant ascites -She has  under gone a therapeutic paracentesis today with removal of 5.2 L.  History of DVT -Continue therapeutic dosing of Lovenox, CT angiogram of the chest on admission was negative for PE.   DVT prophylaxis: Lovenox Code Status: DNR Family Communication: Niece at bedside updated on plan of care and questions answered Disposition Plan: Pending formal palliative care discussion, suspect patient will elect home with hospice  Consultants:   Palliative care  Oncology  Procedures:   Paracentesis on 10/21  Antimicrobials:  Anti-infectives (From admission, onward)   None       Subjective: Lying in bed, still tearful and anxious, complains of increasing abdominal pain overnight.  Objective: Vitals:   05/16/18 1209 05/16/18 1228 05/16/18 1230 05/16/18 1300  BP: 90/60 98/72 (!) 84/58 95/71  Pulse: 94  92   Resp: 20 (!) 21 18 (!) 24  Temp:      TempSrc:      SpO2: 95%  96%   Weight:      Height:        Intake/Output Summary (Last 24 hours) at 05/16/2018 1629 Last data filed at 05/16/2018 0900 Gross per 24 hour  Intake 1163.3 ml  Output 950 ml  Net 213.3 ml   Filed Weights   05/15/18 0917 05/15/18 1845 05/16/18 0500  Weight: 90.7 kg 93.8 kg 73.5 kg    Examination:  General exam: Alert, awake, oriented x 3 Respiratory system: Clear to auscultation. Respiratory effort normal. Cardiovascular system:RRR. No murmurs, rubs, gallops. Gastrointestinal system: Abdomen is significantly distended, positive fluid wave normal bowel sounds.   Central nervous  system: Alert and oriented. No focal neurological deficits. Extremities: No C/C/E, +pedal pulses Skin: No rashes, lesions or ulcers Psychiatry: Judgement and insight appear normal. Mood & affect appropriate.     Data Reviewed: I have personally reviewed following labs and imaging studies  CBC: Recent Labs  Lab 05/11/18 0403 05/13/18 0942 05/15/18 0931 05/16/18 0429  WBC 2.0* 5.1 12.8* 14.1*  NEUTROABS 1.0*  --   10.8*  --   HGB 9.7* 9.4* 10.7* 10.3*  HCT 28.7* 28.5* 32.9* 31.8*  MCV 91.4 92.8 93.5 95.8  PLT 219 261 286 169   Basic Metabolic Panel: Recent Labs  Lab 05/10/18 1441 05/11/18 0403 05/13/18 0942 05/15/18 0931 05/16/18 0429  NA 127* 132* 130* 124* 129*  K 4.0 4.1 4.3 4.5 4.5  CL 93* 94* 93* 90* 97*  CO2 26 30 28 25 25   GLUCOSE 133* 113* 146* 185* 105*  BUN 11 12 15 20 21   CREATININE 0.60 0.74 0.81 0.98 0.81  CALCIUM 8.1* 8.8* 8.6* 8.3* 8.3*  MG 1.7  --   --   --   --    GFR: Estimated Creatinine Clearance: 57.2 mL/min (by C-G formula based on SCr of 0.81 mg/dL). Liver Function Tests: Recent Labs  Lab 05/16/18 0429  AST 19  ALT 14  ALKPHOS 26*  BILITOT 0.7  PROT 4.7*  ALBUMIN 1.8*   No results for input(s): LIPASE, AMYLASE in the last 168 hours. No results for input(s): AMMONIA in the last 168 hours. Coagulation Profile: Recent Labs  Lab 05/15/18 0931  INR 1.15   Cardiac Enzymes: Recent Labs  Lab 05/15/18 0931  TROPONINI <0.03   BNP (last 3 results) No results for input(s): PROBNP in the last 8760 hours. HbA1C: No results for input(s): HGBA1C in the last 72 hours. CBG: No results for input(s): GLUCAP in the last 168 hours. Lipid Profile: No results for input(s): CHOL, HDL, LDLCALC, TRIG, CHOLHDL, LDLDIRECT in the last 72 hours. Thyroid Function Tests: No results for input(s): TSH, T4TOTAL, FREET4, T3FREE, THYROIDAB in the last 72 hours. Anemia Panel: No results for input(s): VITAMINB12, FOLATE, FERRITIN, TIBC, IRON, RETICCTPCT in the last 72 hours. Urine analysis:    Component Value Date/Time   COLORURINE AMBER (A) 05/15/2018 0931   APPEARANCEUR HAZY (A) 05/15/2018 0931   LABSPEC 1.024 05/15/2018 0931   LABSPEC 1.010 10/16/2016 1530   PHURINE 5.0 05/15/2018 0931   GLUCOSEU NEGATIVE 05/15/2018 0931   GLUCOSEU Negative 10/16/2016 [place] 05/15/2018 0931   BILIRUBINUR NEGATIVE 05/15/2018 0931   BILIRUBINUR Negative 10/16/2016 1530     KETONESUR NEGATIVE 05/15/2018 0931   PROTEINUR NEGATIVE 05/15/2018 0931   UROBILINOGEN 0.2 10/16/2016 1530   NITRITE NEGATIVE 05/15/2018 0931   LEUKOCYTESUR NEGATIVE 05/15/2018 0931   LEUKOCYTESUR Negative 10/16/2016 1530   Sepsis Labs: @LABRCNTIP (procalcitonin:4,lacticidven:4)  ) Recent Results (from the past 240 hour(s))  Urine culture     Status: None   Collection Time: 05/15/18  9:31 AM  Result Value Ref Range Status   Specimen Description   Final    URINE, CLEAN CATCH Performed at [facility] [address] [place], [place] [address]    Special Requests   Final    NONE Performed at [facility], [address], [place], [place] [address]    Culture   Final    NO GROWTH Performed at [facility], [place] [address], [place], [place] [address]    Report Status 05/16/2018 FINAL  Final  MRSA PCR Screening  Status: None   Collection Time: 05/15/18  5:29 PM  Result Value Ref Range Status   MRSA by PCR NEGATIVE NEGATIVE Final    Comment:        The GeneXpert MRSA Assay (FDA approved for NASAL specimens only), is one component of a comprehensive MRSA colonization surveillance program. It is not intended to diagnose MRSA infection nor to guide or monitor treatment for MRSA infections. Performed at [facility], [address], [place], [place] [address]   Culture, body fluid-bottle     Status: None (Preliminary result)   Collection Time: 05/16/18 11:40 AM  Result Value Ref Range Status   Specimen Description ASCITIC  Final   Special Requests   Final    10cc BOTTLES DRAWN AEROBIC AND ANAEROBIC Performed at [facility], [address] [place], [place] [address]    Culture PENDING  Incomplete   Report Status PENDING  Incomplete  Gram stain     Status: None   Collection Time: 05/16/18 11:40 AM  Result Value Ref Range Status   Specimen Description ASCITIC  Final   Special Requests NONE  Final   Gram Stain   Final    CYTOSPIN SMEAR NO ORGANISMS SEEN WBC  PRESENT, PREDOMINANTLY MONONUCLEAR Performed at [facility], [address] [place], [place] [address]    Report Status 05/16/2018 FINAL  Final         Radiology Studies: US Paracentesis  Result Date: 05/16/2018 INDICATION: Malignant ascites, stage IV adenocarcinoma RIGHT lung, remote history breast cancer EXAM: ULTRASOUND GUIDED DIAGNOSTIC AND THERAPEUTIC PARACENTESIS MEDICATIONS: None. COMPLICATIONS: None immediate. PROCEDURE: Procedure, benefits, and risks of procedure were discussed with patient. Written informed consent for procedure was obtained. Time out protocol followed. Adequate collection of ascites localized by ultrasound in RIGHT lower quadrant. Skin prepped and draped in usual sterile fashion. Skin and soft tissues anesthetized with 10 mL of 1% lidocaine. 5 [place] Yueh catheter placed into peritoneal cavity. 5.2 L of yellow ascitic fluid fluid aspirated by vacuum bottle suction. Procedure tolerated well by patient without immediate complication. FINDINGS: A total of approximately 5.2 L of ascitic fluid was removed. Samples were sent to the laboratory as requested by the clinical team. IMPRESSION: Successful ultrasound-guided paracentesis yielding 5.2 back liters of peritoneal fluid. Electronically Signed   By: Lavonia Dana M.D.   On: 05/16/2018 12:34   Dg Chest Port 1 View  Result Date: 05/15/2018 CLINICAL DATA:  Shortness of breath. Stage IV lung cancer diagnosed 05/12/2018. EXAM: PORTABLE CHEST 1 VIEW COMPARISON:  05/08/2018 and 11/30/2017 as well as chest CT 05/05/2018 FINDINGS: Left IJ central venous catheter unchanged. Lungs are hypoinflated with minimal stable left retrocardiac opacification. No effusion or pneumothorax. Mild stable cardiomegaly. Remainder of the exam is unchanged. IMPRESSION: Stable left retrocardiac opacification likely atelectasis as seen on recent CT scan. Mild stable cardiomegaly. Left-sided Port-A-Cath unchanged. Electronically Signed   By: Marin Olp  M.D.   On: 05/15/2018 10:52        Scheduled Meds: . enoxaparin  120 mg Subcutaneous Q24H  . feeding supplement (ENSURE ENLIVE)  237 mL Oral QAC breakfast  . levETIRAcetam  500 mg Oral BID  . pantoprazole  40 mg Oral Daily  . polyethylene glycol  17 g Oral BID  . senna-docusate  2 tablet Oral QHS   Continuous Infusions: . sodium chloride 75 mL/hr at 05/16/18 0900     LOS: 1 day    Time spent: 35 minutes. Greater than 50% of this time was spent in  direct contact with the patient and with patient's niece, coordinating care and discussing relevant ongoing clinical issues, including plan for therapeutic paracentesis today, continued to discuss goals of care and oncology's opinion that no further treatment is possible for her metastatic lung cancer, have also continue to discuss hospice care with patient.     Lelon Frohlich, MD [facility] Pager [phone]  If 7PM-7AM, please contact night-coverage [email or web address] Password [facility] 05/16/2018, 4:29 PM

## 2018-05-16 NOTE — Procedures (Signed)
PreOperative Dx: Stage IV adenocarcinoma RIGHT lung, malignant ascites Postoperative Dx: Stage IV adenocarcinoma RIGHT lung, malignant ascites Procedure:   US guided paracentesis Radiologist:  Thornton Papas Anesthesia:  10 ml of1% lidocaine Specimen:  5.2 L of yellow ascitic fluid EBL:   < 1 ml Complications: None

## 2018-05-16 NOTE — Progress Notes (Signed)
No charge note: full note to follow:   Spoke with patient's RN and [employer] and [name].   Patient is in agreement with transition to hospice care. She is having a great deal of pain. Per RN .5mg  IV hydromorphone treats pain, however, pain has returned before next dose can be had.   Evaluated patient with family at bedside. She agrees with GOC. Discussed increasing hydromorphone IV frequency tonight with plans to transition to long acting pain medication tomorrow. She is in agreement.   Molly Black, AGNP-C Palliative Medicine  Please call Palliative Medicine team phone with any questions [phone]. For individual providers please see AMION.

## 2018-05-17 ENCOUNTER — Telehealth: Payer: Self-pay | Admitting: *Deleted

## 2018-05-17 DIAGNOSIS — R180 Malignant ascites: Secondary | ICD-10-CM

## 2018-05-17 DIAGNOSIS — Z515 Encounter for palliative care: Secondary | ICD-10-CM

## 2018-05-17 DIAGNOSIS — C799 Secondary malignant neoplasm of unspecified site: Secondary | ICD-10-CM

## 2018-05-17 DIAGNOSIS — K5909 Other constipation: Secondary | ICD-10-CM

## 2018-05-17 DIAGNOSIS — Z7189 Other specified counseling: Secondary | ICD-10-CM

## 2018-05-17 DIAGNOSIS — R0602 Shortness of breath: Secondary | ICD-10-CM

## 2018-05-17 MED ORDER — GLYCERIN (LAXATIVE) 2.1 G RE SUPP
1.0000 | Freq: Once | RECTAL | Status: AC
  Administered 2018-05-17: 1 via RECTAL
  Filled 2018-05-17: qty 1

## 2018-05-17 MED ORDER — LORAZEPAM 0.5 MG PO TABS: 0.5000 mg | ORAL_TABLET | ORAL | Status: DC | PRN

## 2018-05-17 MED ORDER — FENTANYL 12 MCG/HR TD PT72
25.0000 ug | MEDICATED_PATCH | TRANSDERMAL | Status: DC
  Administered 2018-05-17: 25 ug via TRANSDERMAL
  Filled 2018-05-17: qty 2

## 2018-05-17 MED ORDER — MORPHINE SULFATE (CONCENTRATE) 10 MG/0.5ML PO SOLN
10.0000 mg | ORAL | Status: DC | PRN
  Administered 2018-05-17 – 2018-05-18 (×4): 10 mg via SUBLINGUAL
  Filled 2018-05-17 (×4): qty 0.5

## 2018-05-17 NOTE — Progress Notes (Signed)
Present with [facility] for support. Molly Black, a niece was also present for our discussion. Molly Black gave a bit of her recent health history and how she was working through this recent change. She was very expressive about support among her church family and others, her many years of working at [employer]. She wanted to discuss faith issues as they relate to end of life. We had a good discussion about her peace with God which she stated was very important to her now. We prayed.

## 2018-05-17 NOTE — Progress Notes (Signed)
PROGRESS NOTE    Malai Lady [facility]  YYT:[id number] DOB: 28-Jan-1941 DOA: 05/15/2018 PCP: [name], MD     Brief Narrative:  76 year old woman admitted from home on 10/20 due to shortness of breath and difficulty urinating.  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  She had a recent hospitalization at [facility] at which time she had paracentesis and was diagnosed with SBP and completed a course of Rocephin.  She returns today, she seems surprised about the findings of her CT scan.  She is being admitted due to her hypotension and urinary retention and need for repeat paracentesis for pain management.  On 10/22, after multiple family discussions, we have decided to pursue with going home with hospice.  Pain regimen will be adjusted today with plans for potential discharge on 10/23.   Assessment & Plan:   Principal Problem:   Hypotension Active Problems:   Adenocarcinoma of right lung, stage 4 (HCC)   Carcinoma of breast, stage 2, estrogen receptor negative, right (HCC)   GERD (gastroesophageal reflux disease)   DNR (do not resuscitate) discussion   Personal history of DVT (deep vein thrombosis)   Hyponatremia   Malignant ascites   Acute urinary retention   Hypotension -Suspect hypovolemic in the face of malignant ascites and significant third spacing of fluids.  -No source of infection has been identified. -Culture data remains negative at this time, she does not meet SIRS criteria. -After discussion with patient and family we would not pursue pressors at this time. -She remains hypotensive in the 27N to 17G systolic, as we are changing goals to comfort will not further addressed at this time.  Stage IV adenocarcinoma of the lung -Is quite advanced as per her recent CT scan. -Discussed case with [name] who in turn discussed with [name] and they have both determined  that no further treatment options are available to her. -We have decided to pursue hospice care and patient will be going home with hospice on 10/23.  Malignant ascites -She has under gone a therapeutic paracentesis on 10/21 with removal of 5.2 L.  History of DVT -Continue therapeutic dosing of Lovenox, CT angiogram of the chest on admission was negative for PE.  Acute pain related to metastatic lung cancer -Plan to start fentanyl patch today as well as Roxanol.   DVT prophylaxis: Lovenox Code Status: DNR Family Communication: Niece and nephew at bedside updated on plan of care and questions answered Disposition Plan: Home with hospice on 10/23 as long as pain is better controlled.  Consultants:   Palliative care  Oncology  Procedures:   Paracentesis on 10/21 with removal of 5.2 L  Antimicrobials:  Anti-infectives (From admission, onward)   None       Subjective: Lying in bed, seems more at peace with her decision.  She tells me that her goals are to have less pain and to be able to sit up in a chair.  Objective: Vitals:   05/17/18 0600 05/17/18 0630 05/17/18 0700 05/17/18 0735  BP: 94/64 (!) 79/66 (!) 92/57   Pulse: 92 91 86 87  Resp: (!) 24 16 16 18   Temp:    (!) 97.2 F (36.2 C)  TempSrc:    Oral  SpO2: 97% 96% 98% 96%  Weight:      Height:        Intake/Output Summary (Last 24 hours) at 05/17/2018 1035 Last data filed  at 05/17/2018 0642 Gross per 24 hour  Intake 1523.73 ml  Output 600 ml  Net 923.73 ml   Filed Weights   05/15/18 1845 05/16/18 0500 05/17/18 0400  Weight: 93.8 kg 73.5 kg 89.9 kg    Examination:  General exam: Alert, awake, oriented x 3 Respiratory system: Clear to auscultation. Respiratory effort normal. Cardiovascular system:RRR. No murmurs, rubs, gallops. Gastrointestinal system: Abdomen is distended, positive fluid wave soft and nontender. Normal bowel sounds heard. Central nervous system: Alert and oriented. No focal  neurological deficits. Extremities: No C/C/E, +pedal pulses Skin: No rashes, lesions or ulcers Psychiatry: Judgement and insight appear normal. Mood & affect appropriate.      Data Reviewed: I have personally reviewed following labs and imaging studies  CBC: Recent Labs  Lab 05/11/18 0403 05/13/18 0942 05/15/18 0931 05/16/18 0429  WBC 2.0* 5.1 12.8* 14.1*  NEUTROABS 1.0*  --  10.8*  --   HGB 9.7* 9.4* 10.7* 10.3*  HCT 28.7* 28.5* 32.9* 31.8*  MCV 91.4 92.8 93.5 95.8  PLT 219 261 286 329   Basic Metabolic Panel: Recent Labs  Lab 05/10/18 1441 05/11/18 0403 05/13/18 0942 05/15/18 0931 05/16/18 0429  NA 127* 132* 130* 124* 129*  K 4.0 4.1 4.3 4.5 4.5  CL 93* 94* 93* 90* 97*  CO2 26 30 28 25 25   GLUCOSE 133* 113* 146* 185* 105*  BUN 11 12 15 20 21   CREATININE 0.60 0.74 0.81 0.98 0.81  CALCIUM 8.1* 8.8* 8.6* 8.3* 8.3*  MG 1.7  --   --   --   --    GFR: Estimated Creatinine Clearance: 63.5 mL/min (by C-G formula based on SCr of 0.81 mg/dL). Liver Function Tests: Recent Labs  Lab 05/16/18 0429  AST 19  ALT 14  ALKPHOS 26*  BILITOT 0.7  PROT 4.7*  ALBUMIN 1.8*   No results for input(s): LIPASE, AMYLASE in the last 168 hours. No results for input(s): AMMONIA in the last 168 hours. Coagulation Profile: Recent Labs  Lab 05/15/18 0931  INR 1.15   Cardiac Enzymes: Recent Labs  Lab 05/15/18 0931  TROPONINI <0.03   BNP (last 3 results) No results for input(s): PROBNP in the last 8760 hours. HbA1C: No results for input(s): HGBA1C in the last 72 hours. CBG: No results for input(s): GLUCAP in the last 168 hours. Lipid Profile: No results for input(s): CHOL, HDL, LDLCALC, TRIG, CHOLHDL, LDLDIRECT in the last 72 hours. Thyroid Function Tests: No results for input(s): TSH, T4TOTAL, FREET4, T3FREE, THYROIDAB in the last 72 hours. Anemia Panel: No results for input(s): VITAMINB12, FOLATE, FERRITIN, TIBC, IRON, RETICCTPCT in the last 72 hours. Urine  analysis:    Component Value Date/Time   COLORURINE AMBER (A) 05/15/2018 0931   APPEARANCEUR HAZY (A) 05/15/2018 0931   LABSPEC 1.024 05/15/2018 0931   LABSPEC 1.010 10/16/2016 1530   PHURINE 5.0 05/15/2018 0931   GLUCOSEU NEGATIVE 05/15/2018 0931   GLUCOSEU Negative 10/16/2016 [place] 05/15/2018 0931   BILIRUBINUR NEGATIVE 05/15/2018 0931   BILIRUBINUR Negative 10/16/2016 1530   KETONESUR NEGATIVE 05/15/2018 0931   PROTEINUR NEGATIVE 05/15/2018 0931   UROBILINOGEN 0.2 10/16/2016 1530   NITRITE NEGATIVE 05/15/2018 0931   LEUKOCYTESUR NEGATIVE 05/15/2018 0931   LEUKOCYTESUR Negative 10/16/2016 1530   Sepsis Labs: @LABRCNTIP (procalcitonin:4,lacticidven:4)  ) Recent Results (from the past 240 hour(s))  Urine culture     Status: None   Collection Time: 05/15/18  9:31 AM  Result Value Ref Range Status   Specimen Description  Final    URINE, CLEAN CATCH Performed at [facility], [address] [place], [place] [address]    Special Requests   Final    NONE Performed at [facility], [address], [place], [place] [address]    Culture   Final    NO GROWTH Performed at [facility], [place] [address] [place], [place] [address]    Report Status 05/16/2018 FINAL  Final  MRSA PCR Screening     Status: None   Collection Time: 05/15/18  5:29 PM  Result Value Ref Range Status   MRSA by PCR NEGATIVE NEGATIVE Final    Comment:        The GeneXpert MRSA Assay (FDA approved for NASAL specimens only), is one component of a comprehensive MRSA colonization surveillance program. It is not intended to diagnose MRSA infection nor to guide or monitor treatment for MRSA infections. Performed at [facility], [address], [place], [place] [address]   Culture, body fluid-bottle     Status: None (Preliminary result)   Collection Time: 05/16/18 11:40 AM  Result Value Ref Range Status   Specimen Description ASCITIC  Final   Special Requests 10cc BOTTLES DRAWN  AEROBIC AND ANAEROBIC  Final   Culture   Final    NO GROWTH < 24 HOURS Performed at [facility], [address], [place], [place] [address]    Report Status PENDING  Incomplete  Gram stain     Status: None   Collection Time: 05/16/18 11:40 AM  Result Value Ref Range Status   Specimen Description ASCITIC  Final   Special Requests NONE  Final   Gram Stain   Final    CYTOSPIN SMEAR NO ORGANISMS SEEN WBC PRESENT, PREDOMINANTLY MONONUCLEAR Performed at [facility], [address] [place], [place] [address]    Report Status 05/16/2018 FINAL  Final         Radiology Studies: US Paracentesis  Result Date: 05/16/2018 INDICATION: Malignant ascites, stage IV adenocarcinoma RIGHT lung, remote history breast cancer EXAM: ULTRASOUND GUIDED DIAGNOSTIC AND THERAPEUTIC PARACENTESIS MEDICATIONS: None. COMPLICATIONS: None immediate. PROCEDURE: Procedure, benefits, and risks of procedure were discussed with patient. Written informed consent for procedure was obtained. Time out protocol followed. Adequate collection of ascites localized by ultrasound in RIGHT lower quadrant. Skin prepped and draped in usual sterile fashion. Skin and soft tissues anesthetized with 10 mL of 1% lidocaine. 5 [place] Yueh catheter placed into peritoneal cavity. 5.2 L of yellow ascitic fluid fluid aspirated by vacuum bottle suction. Procedure tolerated well by patient without immediate complication. FINDINGS: A total of approximately 5.2 L of ascitic fluid was removed. Samples were sent to the laboratory as requested by the clinical team. IMPRESSION: Successful ultrasound-guided paracentesis yielding 5.2 back liters of peritoneal fluid. Electronically Signed   By: Lavonia Dana M.D.   On: 05/16/2018 12:34        Scheduled Meds: . enoxaparin  120 mg Subcutaneous Q24H  . feeding supplement (ENSURE ENLIVE)  237 mL Oral QAC breakfast  . levETIRAcetam  500 mg Oral BID  . pantoprazole  40 mg Oral Daily  . polyethylene glycol   17 g Oral BID  . senna-docusate  2 tablet Oral QHS   Continuous Infusions: . sodium chloride 75 mL/hr at 05/17/18 0642     LOS: 2 days    Time spent: 25 minutes.     Lelon Frohlich, MD [facility] Pager [phone]  If 7PM-7AM, please contact night-coverage [email or web address] Password [facility] 05/17/2018, 10:35  AM    
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
## 2018-05-17 NOTE — Telephone Encounter (Signed)
Lee called, pt has been referred to Hospice and would like Dr. Julien Nordmann to be attending. Reviewed with MD, agreed to be attending.

## 2018-05-17 NOTE — Care Management Note (Signed)
Case Management Note  Patient Details  Name: Molly Black MRN: [id number] Date of Birth: [birth year]  Subjective/Objective:                  Hypotension.   Action/Plan: Patient and family have met with Palliatvie NP.  They elect to go home with hospice. [facility]. They will need a hospital bed, over bed table, and oxygen (for comfort). Point person will be daughter, [name] at [phone]. Family plans to clear out space for hospital bed later today. Anticipate DC home tomorrow morning after all equipment has been delivered.   Expected Discharge Date:    05/18/2018              Expected Discharge Plan:  Home w Hospice Care  In-House Referral:  Hospice / Palliative Care  Discharge planning Services  CM Consult  Post Acute Care Choice:  Hospice Choice offered to:  Patient, Adult Children  DME Arranged:    DME Agency:     HH Arranged:    [place] Agency:     Status of Service:  Completed, signed off  If discussed at [employer] of Stay Meetings, dates discussed:    Additional Comments:  Miyuki Rzasa, Chauncey Reading, RN 05/17/2018, 11:07 AM

## 2018-05-17 NOTE — Progress Notes (Signed)
Dr Jerilee Hoh and Elon Alas NP aware of patients blood pressure being in the 28'M & 38'T systolic. Patient asymptomatic with no complaints of dizziness, chest pain, nausea or vomiting. Patient up out of bed to chair and bedside commode with x2 stand-by assist. Patient given Glycerin suppository with positive effect. Patient had a medium size, very soft, mushy brown stool on bedside commode and is passing gas.

## 2018-05-17 NOTE — Care Management (Addendum)
Demographics              Molly Black, Molly Black    Female  1941-02-19 (76 yrs)  [id number]  MRN: [id number]    Demographics [address], [place] [address] Home: [phone]       Work:        Mobile: [phone]       Email: [email or web address]     PCP: [name], MD (General) Employment:Retired            Patient Contacts [facility]) [facility] (Relative) Showing 2 of 3      [phone] Show All            Rigoberto Noel (Relative)        [phone]        Guarantors & Coverages  Add Guarantor                                   P/F Norleen, Xie [[id number]] Encounter guarantor  Show all cvgs   Add Coverage        Guarantor Demographics Address linked to patient Home: [phone] Rel to patient: Self      Work:     [employer] Info  Employment:Retired      Add Account Note                                     1. E-UNITED [employer]*/UHC MEDICARE   Encounter coverage  E-Verified Response History         Subscriber Demographics [place]: [phone]     Address same as patient Work:                  Coverage Info Member ID: [id number] Group:  [id number] Rel to subscriber: Self     Subscriber ID: [id number] Effective from: 07/27/2014 Auth phone:                                  Encounter Info              Copay due: 90.00  Copay paid: 0.00       Prepay due: 0.00  Prepay paid: 0.00                   Additional Info           Coverage Copy  The following coverages are available for copy from [place] to P/F guarantor account [id number] - [place].   Payor/Plan Subscriber Name Eff From Eff To Acct Types

## 2018-05-18 DIAGNOSIS — C50911 Malignant neoplasm of unspecified site of right female breast: Secondary | ICD-10-CM

## 2018-05-18 DIAGNOSIS — E861 Hypovolemia: Secondary | ICD-10-CM

## 2018-05-18 DIAGNOSIS — C3491 Malignant neoplasm of unspecified part of right bronchus or lung: Secondary | ICD-10-CM

## 2018-05-18 DIAGNOSIS — K219 Gastro-esophageal reflux disease without esophagitis: Secondary | ICD-10-CM

## 2018-05-18 DIAGNOSIS — I9589 Other hypotension: Secondary | ICD-10-CM

## 2018-05-18 DIAGNOSIS — R180 Malignant ascites: Secondary | ICD-10-CM

## 2018-05-18 DIAGNOSIS — Z86718 Personal history of other venous thrombosis and embolism: Secondary | ICD-10-CM

## 2018-05-18 DIAGNOSIS — R338 Other retention of urine: Secondary | ICD-10-CM

## 2018-05-18 DIAGNOSIS — Z7189 Other specified counseling: Secondary | ICD-10-CM

## 2018-05-18 DIAGNOSIS — R0602 Shortness of breath: Secondary | ICD-10-CM

## 2018-05-18 DIAGNOSIS — Z171 Estrogen receptor negative status [ER-]: Secondary | ICD-10-CM

## 2018-05-18 DIAGNOSIS — C799 Secondary malignant neoplasm of unspecified site: Secondary | ICD-10-CM

## 2018-05-18 LAB — PATHOLOGIST SMEAR REVIEW

## 2018-05-18 MED ORDER — MORPHINE SULFATE (CONCENTRATE) 10 MG/0.5ML PO SOLN: 10.0000 mg | ORAL | 0 refills | Status: AC | PRN

## 2018-05-18 MED ORDER — MORPHINE SULFATE (CONCENTRATE) 10 MG/0.5ML PO SOLN
10.0000 mg | Freq: Once | ORAL | Status: AC
  Administered 2018-05-18: 10 mg via SUBLINGUAL
  Filled 2018-05-18: qty 0.5

## 2018-05-18 MED ORDER — FENTANYL 12 MCG/HR TD PT72: 25.0000 ug | MEDICATED_PATCH | TRANSDERMAL | 0 refills | Status: AC

## 2018-05-18 MED ORDER — HEPARIN SOD (PORK) LOCK FLUSH 100 UNIT/ML IV SOLN
500.0000 [IU] | INTRAVENOUS | Status: AC | PRN
  Administered 2018-05-18: 500 [IU]
  Filled 2018-05-18: qty 5

## 2018-05-18 MED ORDER — LORAZEPAM 0.5 MG PO TABS: 0.5000 mg | ORAL_TABLET | ORAL | 0 refills | Status: AC | PRN

## 2018-05-18 NOTE — Progress Notes (Signed)
Patient alert and oriented x4. No complaints of shortness of breath, chest pain, dizziness, nausea or vomiting. PRN medications given for assistance with chronic pain control. Patient tolerated diet and PO meds well. Appetite fair. Foley catheter patent and draining amber colored urine. Foley left in per Elon Alas NP and MD request due to patient going home with hospice care. Patient agreeable with plan. Discharge instructions gone over with patient. Port de-accessed without complications. Dressing clean, dry and intact upon discharge. EMS called and here to pick patient up to transport patient home. Discharge instructions, DNR form and Scripts given to EMS to transport with patient home. Sister-in-law took patient belongings home. Patient discharged with above paperwork (via EMS personnel) for home with hospice care.

## 2018-05-18 NOTE — Discharge Summary (Signed)
Physician Discharge Summary  Molly Black [name] UXL:[id number] DOB: 1940-12-10 DOA: 05/15/2018  PCP: [name], MD  Admit date: 05/15/2018 Discharge date: 05/18/2018  Admitted From: Home Disposition: Home with Hospice   Recommendations for Outpatient Follow-up:  1. Follow up with PCP in 1-2 weeks 2. Please obtain BMP/CBC in one week  Home Health: Hospice Equipment/Devices: hospital bed, table, oxygen for comfort Discharge Condition: Stable for transfer, guarded prognosis CODE STATUS: DNR Diet recommendation: As tolerated  Brief/Interim Summary: 77 year old woman admitted from home on 10/20 due to shortness of breath and difficulty urinating.  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  She had a recent hospitalization at [facility] at which time she had paracentesis and was diagnosed with SBP and completed a course of Rocephin.  She returns today, she seems surprised about the findings of her CT scan.  She is being admitted due to her hypotension and urinary retention and need for repeat paracentesis for pain management.  On 10/22, after multiple family discussions, we have decided to pursue with going home with hospice.  Pain regimen was adjusted with good effect and the patient remains stable for discharge to home with hospice.  Discharge Diagnoses:  Principal Problem:   Hypotension Active Problems:   Carcinoma of breast, stage 2, estrogen receptor negative, right (HCC)   GERD (gastroesophageal reflux disease)   Adenocarcinoma of right lung, stage 4 (HCC)   DNR (do not resuscitate) discussion   Personal history of DVT (deep vein thrombosis)   Hyponatremia   Malignant ascites   Acute urinary retention   Metastatic cancer (HCC)   Shortness of breath   Advanced care planning/counseling discussion  Hypotension: No infectious source, suspect hypovolemic from third spacing due to  third.  -Culture data remains negative.  -No escalation of care recommended or desired on the part of patient/family.   Stage IV adenocarcinoma of the lung: Advanced per her recent CT scan. -Discussed case with [name] who in turn discussed with [name] and they have both determined that no further treatment options are available to her. -We have decided to pursue hospice care and patient will be going home with hospice on 10/23.  Malignant ascites -She has under gone a therapeutic paracentesis on 10/21 with removal of 5.2 L.  Paracentesis site leak: Mild. Discussed with patient and family as well as hospitalist colleagues. Feel no intervention is required other than supportive with pressure dressing, replacing gauze. Pt does not wish for catheter placement or suture, etc.   History of DVT -Continue therapeutic dosing of Lovenox, CT angiogram of the chest on admission was negative for PE.  Acute pain related to metastatic lung cancer -Started fentanyl patch as well as Roxanol.  Anxiety:  - PRN lorazepam  Urinary retention: On arrival, treated with foley. For comfort and to prevent readmission/decompensation, will continue foley at discharge.   Discharge Instructions Discharge Instructions    Discharge instructions   Complete by:  As directed    Follow up with [facility] for ongoing management of symptoms.  - Use fentanyl patch for pain (next application 53/66) and roxanol for as needed pain.  - Use ativan for anxiety as needed - Keep gauze/covering over the leaking site on the belly. This does not require further intervention at this time.     Allergies as of 05/18/2018      Reactions   Other Rash   A chemo  drug      Medication List    STOP taking these medications   ALPRAZolam 0.5 MG tablet Commonly known as:  XANAX   cephALEXin 500 MG capsule Commonly known as:  KEFLEX   dexamethasone 4 MG tablet Commonly known as:  DECADRON    metoprolol tartrate 25 MG tablet Commonly known as:  LOPRESSOR   Oxycodone HCl 10 MG Tabs     TAKE these medications   acetaminophen 325 MG tablet Commonly known as:  TYLENOL Take 2 tablets (650 mg total) by mouth every 6 (six) hours as needed for mild pain, fever or headache (or Fever >/= 101).   enoxaparin 120 MG/0.8ML injection Commonly known as:  LOVENOX Inject 120 mg into the skin daily.   feeding supplement Liqd Take 237 mLs by mouth AC breakfast. Takes occasionally   fentaNYL 12 MCG/HR Commonly known as:  DURAGESIC - dosed mcg/hr Place 2 patches (25 mcg total) onto the skin every 3 (three) days. next patch due 10/25. Start taking on:  56/21/3086   folic acid 1 MG tablet Commonly known as:  FOLVITE TAKE 1 TABLET BY MOUTH ONCE A DAY.   guaiFENesin 600 MG 12 hr tablet Commonly known as:  MUCINEX Take 1 tablet (600 mg total) by mouth 2 (two) times daily as needed.   levETIRAcetam 500 MG tablet Commonly known as:  KEPPRA Take 1 tablet (500 mg total) by mouth 2 (two) times daily.   lidocaine-prilocaine cream Commonly known as:  EMLA Apply 1 application topically as needed. Apply 1-2  tsp over port site 1.5 -2 hours prior to chemotherapy.   LORazepam 0.5 MG tablet Commonly known as:  ATIVAN Place 1 tablet (0.5 mg total) under the tongue every 4 (four) hours as needed for anxiety or sleep (or SOB).   morphine CONCENTRATE 10 MG/0.5ML Soln concentrated solution Place 0.5 mLs (10 mg total) under the tongue every 2 (two) hours as needed for severe pain or shortness of breath.   neomycin-polymyxin-hydrocortisone 3.5-10000-1 ophthalmic suspension Commonly known as:  CORTISPORIN Place 2 drops 4 (four) times daily into both eyes.   omeprazole 40 MG capsule Commonly known as:  PRILOSEC Take 1 capsule (40 mg total) by mouth 2 (two) times daily. 30 minutes before breakfast and 30 minutes before dinner   ondansetron 4 MG tablet Commonly known as:  ZOFRAN Take 1 tablet (4  mg total) by mouth every 6 (six) hours as needed for nausea.   polyethylene glycol packet Commonly known as:  MIRALAX / GLYCOLAX Take 17 g by mouth 2 (two) times daily.   prochlorperazine 10 MG tablet Commonly known as:  COMPAZINE Take 10 mg by mouth every 6 (six) hours as needed for nausea or vomiting.   senna-docusate 8.6-50 MG tablet Commonly known as:  Senokot-S Take 2 tablets by mouth at bedtime.      [place], [facility] Follow up.   Contact information: [address] [place] [address] [phone]          Allergies  Allergen Reactions  . Other Rash    A chemo drug    Consultations:  Palliative care  Oncology  Procedures/Studies: Ct Chest W Contrast  Result Date: 05/05/2018 CLINICAL DATA:  Abdominal distention and pain. Weight loss. Chemotherapy for metastatic lung cancer EXAM: CT CHEST, ABDOMEN, AND PELVIS WITH CONTRAST TECHNIQUE: Multidetector CT imaging of the chest, abdomen and pelvis was performed following the standard protocol during bolus administration of intravenous contrast. CONTRAST:  131mL  ISOVUE-300 IOPAMIDOL (ISOVUE-300) INJECTION 61% COMPARISON:  02/25/2018 FINDINGS: CT CHEST FINDINGS Cardiovascular: Left Port-A-Cath tip: SVC. Atherosclerotic calcification of the aortic arch as well as the right and left anterior descending coronary arteries. Mediastinum/Nodes: Mild chronic stranding in the mediastinum without discrete adenopathy in the chest. Lungs/Pleura: Centrilobular emphysema. Bilateral central scarring in the lungs favoring radiation port. This includes a bandlike density in the left lower lobe which is chronic. Ground-glass density nodule in the superior segment left lower lobe 1.3 by 1.0 cm, formerly 1.4 by 1.1 cm. Subsegmental atelectasis in the lingula is increased compared to the prior exam. A second ground-glass nodular density in the left lower lobe on image 45/4 stable at about 1.0 cm in diameter.  Musculoskeletal: Thoracic spondylosis. Right mastectomy with breast implant. CT ABDOMEN PELVIS FINDINGS Hepatobiliary: In addition to some pre-existing hepatic cysts, we demonstrate approximately 13 new solid masses in the liver compatible with metastatic disease, were not present on 02/25/2018. An index mass posteriorly in the lateral segment left hepatic lobe measures 3.2 by 2.9 cm (image 48/2). Cholecystectomy. Pancreas: Unremarkable Spleen: Unremarkable Adrenals/Urinary Tract: Unremarkable Stomach/Bowel: Abnormal focal thickening of the distal transverse colon is probably attributable to adjacent new omental tumor for example on image 57/2. There is also a markedly thickened short segment of proximal jejunum about 5 cm in length shown on image 83/5, tumor involvement of this segment of the gene jejunum is a distinct possibility. A new omental nodule measuring 1.1 cm diameter on image 81/2 is compatible with tumor deposit. Other more subtle omental tumor deposits are present. Vascular/Lymphatic: Aortoiliac atherosclerotic vascular disease. Reproductive: Uterus absent.  Adnexa unremarkable. Other: Considerable new ascites, likely malignant. Musculoskeletal: Subcutaneous edema along the anterior abdominal wall Fully segmental S1 level. 7 mm of degenerative anterolisthesis at L5-S1 with resulting bilateral foraminal impingement and at least moderate central narrowing of the thecal sac. IMPRESSION: 1. Numerous new hepatic masses compatible with metastatic lesions. 2. Masslike thickening of the proximal jejunum with adjacent nodularity, possibly from metastatic disease to the jejunum. 3. New omental tumor implants especially in the left upper quadrant along with moderate ascites compatible with peritoneal spread of tumor. There is tumor along the margin of a mildly thickened segment of distal transverse colon. 4. Stable ground-glass density nodules in the left lower lobe. 5. Other imaging findings of potential  clinical significance: Aortic Atherosclerosis (ICD10-I70.0) and Emphysema (ICD10-J43.9). Coronary atherosclerosis. Subcutaneous edema along the anterior abdominal wall. 7 mm anterolisthesis at L5-S1 with central and foraminal impingement. Fully segmental S1 vertebral level. Electronically Signed   By: Van Clines M.D.   On: 05/05/2018 23:16   Mr Thoracic Spine W Wo Contrast  Result Date: 05/08/2018 CLINICAL DATA:  Back pain.  Metastatic lung cancer. EXAM: MRI THORACIC WITHOUT AND WITH CONTRAST TECHNIQUE: Multiplanar and multiecho pulse sequences of the thoracic spine were obtained without and with intravenous contrast. CONTRAST:  Gadavist 8.5 mL COMPARISON:  Chest CT 05/05/2018 FINDINGS: The patient was unable to remain motionless for the exam. Small or subtle lesions could be overlooked. MRI THORACIC SPINE FINDINGS Alignment:  Physiologic. Vertebrae: No fracture, evidence of discitis, or bone lesion. Scattered hemangiomata. Post infusion, no abnormal vertebral body is enhancement. Cord: Essentially normal signal and morphology. Slight deformity related to disc protrusion described below, with no abnormal signal. Paraspinal and other soft tissues: BILATERAL effusions. Ascites. Hepatic tumor and omental lesions poorly visualized. Disc levels: No compressive disc herniation except for T5-6 on the LEFT, where there is mild mass effect on the subarachnoid space and  slight LEFT-sided cord flattening. No significant spinal stenosis. No abnormal cord signal. IMPRESSION: Suboptimal, motion degraded MRI of the thoracic spine without and with contrast demonstrates no definite areas of osseous metastatic disease. Slight cord deformity related to calcified T5-6 protrusion on the LEFT, but no stenosis or abnormal cord signal. No abnormal enhancement of the cord or intraspinal contents. Additional extra-spinal findings better detailed on CT chest abdomen pelvis, including BILATERAL pleural effusions and ascites, as  well as visceral disease. Electronically Signed   By: Staci Righter M.D.   On: 05/08/2018 21:51   Ct Abdomen Pelvis W Contrast  Result Date: 05/05/2018 CLINICAL DATA:  Abdominal distention and pain. Weight loss. Chemotherapy for metastatic lung cancer EXAM: CT CHEST, ABDOMEN, AND PELVIS WITH CONTRAST TECHNIQUE: Multidetector CT imaging of the chest, abdomen and pelvis was performed following the standard protocol during bolus administration of intravenous contrast. CONTRAST:  167mL ISOVUE-300 IOPAMIDOL (ISOVUE-300) INJECTION 61% COMPARISON:  02/25/2018 FINDINGS: CT CHEST FINDINGS Cardiovascular: Left Port-A-Cath tip: SVC. Atherosclerotic calcification of the aortic arch as well as the right and left anterior descending coronary arteries. Mediastinum/Nodes: Mild chronic stranding in the mediastinum without discrete adenopathy in the chest. Lungs/Pleura: Centrilobular emphysema. Bilateral central scarring in the lungs favoring radiation port. This includes a bandlike density in the left lower lobe which is chronic. Ground-glass density nodule in the superior segment left lower lobe 1.3 by 1.0 cm, formerly 1.4 by 1.1 cm. Subsegmental atelectasis in the lingula is increased compared to the prior exam. A second ground-glass nodular density in the left lower lobe on image 45/4 stable at about 1.0 cm in diameter. Musculoskeletal: Thoracic spondylosis. Right mastectomy with breast implant. CT ABDOMEN PELVIS FINDINGS Hepatobiliary: In addition to some pre-existing hepatic cysts, we demonstrate approximately 13 new solid masses in the liver compatible with metastatic disease, were not present on 02/25/2018. An index mass posteriorly in the lateral segment left hepatic lobe measures 3.2 by 2.9 cm (image 48/2). Cholecystectomy. Pancreas: Unremarkable Spleen: Unremarkable Adrenals/Urinary Tract: Unremarkable Stomach/Bowel: Abnormal focal thickening of the distal transverse colon is probably attributable to adjacent new  omental tumor for example on image 57/2. There is also a markedly thickened short segment of proximal jejunum about 5 cm in length shown on image 83/5, tumor involvement of this segment of the gene jejunum is a distinct possibility. A new omental nodule measuring 1.1 cm diameter on image 81/2 is compatible with tumor deposit. Other more subtle omental tumor deposits are present. Vascular/Lymphatic: Aortoiliac atherosclerotic vascular disease. Reproductive: Uterus absent.  Adnexa unremarkable. Other: Considerable new ascites, likely malignant. Musculoskeletal: Subcutaneous edema along the anterior abdominal wall Fully segmental S1 level. 7 mm of degenerative anterolisthesis at L5-S1 with resulting bilateral foraminal impingement and at least moderate central narrowing of the thecal sac. IMPRESSION: 1. Numerous new hepatic masses compatible with metastatic lesions. 2. Masslike thickening of the proximal jejunum with adjacent nodularity, possibly from metastatic disease to the jejunum. 3. New omental tumor implants especially in the left upper quadrant along with moderate ascites compatible with peritoneal spread of tumor. There is tumor along the margin of a mildly thickened segment of distal transverse colon. 4. Stable ground-glass density nodules in the left lower lobe. 5. Other imaging findings of potential clinical significance: Aortic Atherosclerosis (ICD10-I70.0) and Emphysema (ICD10-J43.9). Coronary atherosclerosis. Subcutaneous edema along the anterior abdominal wall. 7 mm anterolisthesis at L5-S1 with central and foraminal impingement. Fully segmental S1 vertebral level. Electronically Signed   By: Van Clines M.D.   On: 05/05/2018 23:16  US Abdomen Limited  Result Date: 05/10/2018 CLINICAL DATA:  77 year old female with recurrent ascites secondary to malignancy EXAM: LIMITED ABDOMEN ULTRASOUND FOR ASCITES TECHNIQUE: Limited ultrasound survey for ascites was performed in all four abdominal  quadrants. COMPARISON:  None. FINDINGS: Ultrasound survey demonstrates small volume ascites. The patient discussed the case with the interventional team, and did not want paracentesis performed. IMPRESSION: Small volume ascites with no paracentesis performed. Electronically Signed   By: Corrie Mckusick D.O.   On: 05/10/2018 16:27   US Paracentesis  Result Date: 05/16/2018 INDICATION: Malignant ascites, stage IV adenocarcinoma RIGHT lung, remote history breast cancer EXAM: ULTRASOUND GUIDED DIAGNOSTIC AND THERAPEUTIC PARACENTESIS MEDICATIONS: None. COMPLICATIONS: None immediate. PROCEDURE: Procedure, benefits, and risks of procedure were discussed with patient. Written informed consent for procedure was obtained. Time out protocol followed. Adequate collection of ascites localized by ultrasound in RIGHT lower quadrant. Skin prepped and draped in usual sterile fashion. Skin and soft tissues anesthetized with 10 mL of 1% lidocaine. 5 [place] Yueh catheter placed into peritoneal cavity. 5.2 L of yellow ascitic fluid fluid aspirated by vacuum bottle suction. Procedure tolerated well by patient without immediate complication. FINDINGS: A total of approximately 5.2 L of ascitic fluid was removed. Samples were sent to the laboratory as requested by the clinical team. IMPRESSION: Successful ultrasound-guided paracentesis yielding 5.2 back liters of peritoneal fluid. Electronically Signed   By: Lavonia Dana M.D.   On: 05/16/2018 12:34   US Paracentesis  Result Date: 05/06/2018 INDICATION: Patient with prior history of breast cancer, stage IV non-small cell lung cancer, abdominal discomfort/distension, ascites. Request made for diagnostic and therapeutic paracentesis. EXAM: ULTRASOUND GUIDED DIAGNOSTIC AND THERAPEUTIC PARACENTESIS MEDICATIONS: None COMPLICATIONS: None immediate. PROCEDURE: Informed written consent was obtained from the patient after a discussion of the risks, benefits and alternatives to treatment. A  timeout was performed prior to the initiation of the procedure. Initial ultrasound scanning demonstrates a small to moderate amount of ascites within the left mid to lower abdominal quadrant. The left mid to lower abdomen was prepped and draped in the usual sterile fashion. 1% lidocaine was used for local anesthesia. Following this, a 19 gauge, 10-cm, Yueh catheter was introduced. An ultrasound image was saved for documentation purposes. The paracentesis was performed. The catheter was removed and a dressing was applied. The patient tolerated the procedure well without immediate post procedural complication. FINDINGS: A total of approximately 1.9 liters of hazy, amber fluid was removed. Samples were sent to the laboratory as requested by the clinical team. IMPRESSION: Successful ultrasound-guided diagnostic and therapeutic paracentesis yielding 1.9 liters of peritoneal fluid. Read by: Rowe Robert, PA-C Electronically Signed   By: Jacqulynn Cadet M.D.   On: 05/06/2018 10:58   Dg Chest Port 1 View  Result Date: 05/15/2018 CLINICAL DATA:  Shortness of breath. Stage IV lung cancer diagnosed 05/12/2018. EXAM: PORTABLE CHEST 1 VIEW COMPARISON:  05/08/2018 and 11/30/2017 as well as chest CT 05/05/2018 FINDINGS: Left IJ central venous catheter unchanged. Lungs are hypoinflated with minimal stable left retrocardiac opacification. No effusion or pneumothorax. Mild stable cardiomegaly. Remainder of the exam is unchanged. IMPRESSION: Stable left retrocardiac opacification likely atelectasis as seen on recent CT scan. Mild stable cardiomegaly. Left-sided Port-A-Cath unchanged. Electronically Signed   By: Marin Olp M.D.   On: 05/15/2018 10:52   Dg Abd 2 Views  Result Date: 05/11/2018 CLINICAL DATA:  Shortness of breath, abdominal distension EXAM: ABDOMEN - 2 VIEW COMPARISON:  CT abdomen pelvis of 05/05/2017 FINDINGS: There is a moderately large amount  of feces throughout the colon. No bowel obstruction is seen.  On the left large decubitus of the abdomen no free air is seen. However there is opacity at the left lung base which may reflect atelectasis, but pneumonia cannot be excluded. No opaque calculi are seen. The bones are unremarkable. IMPRESSION: 1. Large amount of feces throughout the colon. No bowel obstruction. 2. No free air. 3. Opacity at the left lung base may reflect atelectasis or possibly pneumonia. Electronically Signed   By: Ivar Drape M.D.   On: 05/11/2018 13:12   Dg Abd Acute W/chest  Result Date: 05/08/2018 CLINICAL DATA:  Patient with abdominal distension. EXAM: DG ABDOMEN ACUTE W/ 1V CHEST COMPARISON:  Abdominal CT 05/05/2018 FINDINGS: Left anterior chest wall Port-A-Cath is present with tip projecting over the superior vena cava. The stable cardiac and mediastinal contours. Low lung volumes. Bibasilar atelectasis. Gas is demonstrated within nondilated loops of large and small bowel in a nonobstructed pattern. Large amount of stool throughout the colon. Decubitus view demonstrates no free intraperitoneal air. Cholecystectomy clips. IMPRESSION: Large amount of stool throughout the colon as can be seen with constipation. Nonobstructed bowel gas pattern. Low lung volumes with basilar atelectasis. Cardiomegaly. Electronically Signed   By: Lovey Newcomer M.D.   On: 05/08/2018 21:10     Paracentesis on 10/21 with removal of 5.2 L  Subjective: Pain is controlled, no dyspnea. No events overnight, para site continues slow trickle of clear fluid.   Discharge Exam: Vitals:   05/18/18 0955 05/18/18 0957  BP:  (!) 86/65  Pulse:  92  Resp:  (!) 21  Temp:    SpO2: 97% 93%   General: Pt is alert, awake, not in acute distress Cardiovascular: RRR, S1/S2 +, no rubs, no gallops Respiratory: Nonlabored, clear but decreased at bases without crackles or wheezes Abdominal: Distended +fluid wave, not significantly tender. Slow tricle of clear fluid from RLQ para site.  Extremities: +upper > lower  extremity edema, no cyanosis  Labs: BNP (last 3 results) Recent Labs    05/15/18 0931  BNP 010.2*   Basic Metabolic Panel: Recent Labs  Lab 05/13/18 0942 05/15/18 0931 05/16/18 0429  NA 130* 124* 129*  K 4.3 4.5 4.5  CL 93* 90* 97*  CO2 28 25 25   GLUCOSE 146* 185* 105*  BUN 15 20 21   CREATININE 0.81 0.98 0.81  CALCIUM 8.6* 8.3* 8.3*   Liver Function Tests: Recent Labs  Lab 05/16/18 0429  AST 19  ALT 14  ALKPHOS 26*  BILITOT 0.7  PROT 4.7*  ALBUMIN 1.8*   No results for input(s): LIPASE, AMYLASE in the last 168 hours. No results for input(s): AMMONIA in the last 168 hours. CBC: Recent Labs  Lab 05/13/18 0942 05/15/18 0931 05/16/18 0429  WBC 5.1 12.8* 14.1*  NEUTROABS  --  10.8*  --   HGB 9.4* 10.7* 10.3*  HCT 28.5* 32.9* 31.8*  MCV 92.8 93.5 95.8  PLT 261 286 295   Cardiac Enzymes: Recent Labs  Lab 05/15/18 0931  TROPONINI <0.03   BNP: Invalid input(s): POCBNP CBG: No results for input(s): GLUCAP in the last 168 hours. D-Dimer No results for input(s): DDIMER in the last 72 hours. Hgb A1c No results for input(s): HGBA1C in the last 72 hours. Lipid Profile No results for input(s): CHOL, HDL, LDLCALC, TRIG, CHOLHDL, LDLDIRECT in the last 72 hours. Thyroid function studies No results for input(s): TSH, T4TOTAL, T3FREE, THYROIDAB in the last 72 hours.  Invalid input(s): FREET3 Anemia work up  No results for input(s): VITAMINB12, FOLATE, FERRITIN, TIBC, IRON, RETICCTPCT in the last 72 hours. Urinalysis    Component Value Date/Time   COLORURINE AMBER (A) 05/15/2018 0931   APPEARANCEUR HAZY (A) 05/15/2018 0931   LABSPEC 1.024 05/15/2018 0931   LABSPEC 1.010 10/16/2016 1530   PHURINE 5.0 05/15/2018 0931   GLUCOSEU NEGATIVE 05/15/2018 0931   GLUCOSEU Negative 10/16/2016 [place] 05/15/2018 0931   BILIRUBINUR NEGATIVE 05/15/2018 0931   BILIRUBINUR Negative 10/16/2016 1530   KETONESUR NEGATIVE 05/15/2018 0931   PROTEINUR NEGATIVE  05/15/2018 0931   UROBILINOGEN 0.2 10/16/2016 1530   NITRITE NEGATIVE 05/15/2018 0931   LEUKOCYTESUR NEGATIVE 05/15/2018 0931   LEUKOCYTESUR Negative 10/16/2016 1530    Microbiology Recent Results (from the past 240 hour(s))  Urine culture     Status: None   Collection Time: 05/15/18  9:31 AM  Result Value Ref Range Status   Specimen Description   Final    URINE, CLEAN CATCH Performed at [facility], [address], [place], [place] [address]    Special Requests   Final    NONE Performed at [facility], [address] [place], [place] [address]    Culture   Final    NO GROWTH Performed at [facility], [place] [address], [place], [place] [address]    Report Status 05/16/2018 FINAL  Final  MRSA PCR Screening     Status: None   Collection Time: 05/15/18  5:29 PM  Result Value Ref Range Status   MRSA by PCR NEGATIVE NEGATIVE Final    Comment:        The GeneXpert MRSA Assay (FDA approved for NASAL specimens only), is one component of a comprehensive MRSA colonization surveillance program. It is not intended to diagnose MRSA infection nor to guide or monitor treatment for MRSA infections. Performed at [facility], [address], [place], [place] [address]   Culture, body fluid-bottle     Status: None (Preliminary result)   Collection Time: 05/16/18 11:40 AM  Result Value Ref Range Status   Specimen Description ASCITIC  Final   Special Requests 10cc BOTTLES DRAWN AEROBIC AND ANAEROBIC  Final   Culture   Final    NO GROWTH 2 DAYS Performed at [facility], [address] [place], [place] [address]    Report Status PENDING  Incomplete  Gram stain     Status: None   Collection Time: 05/16/18 11:40 AM  Result Value Ref Range Status   Specimen Description ASCITIC  Final   Special Requests NONE  Final   Gram Stain   Final    CYTOSPIN SMEAR NO ORGANISMS SEEN WBC PRESENT, PREDOMINANTLY MONONUCLEAR Performed at [facility], [address], [place], [place]  [address]    Report Status 05/16/2018 FINAL  Final    Time coordinating discharge: Approximately 40 minutes  Patrecia Pour, MD  [facility] 05/18/2018, 10:41 AM Pager [phone]

## 2018-05-19 ENCOUNTER — Ambulatory Visit (HOSPITAL_COMMUNITY): Payer: Medicare Other

## 2018-05-21 LAB — CULTURE, BODY FLUID W GRAM STAIN -BOTTLE

## 2018-05-21 LAB — CULTURE, BODY FLUID-BOTTLE: CULTURE: NO GROWTH

## 2018-05-23 ENCOUNTER — Inpatient Hospital Stay: Payer: Medicare Other

## 2018-05-23 ENCOUNTER — Inpatient Hospital Stay: Payer: Medicare Other | Admitting: Internal Medicine

## 2018-05-24 ENCOUNTER — Telehealth: Payer: Self-pay | Admitting: Medical Oncology

## 2018-05-24 NOTE — Telephone Encounter (Signed)
Pt died at home on 05-25-18.

## 2018-05-25 ENCOUNTER — Ambulatory Visit: Payer: Medicare Other | Admitting: Adult Health

## 2018-05-27 ENCOUNTER — Ambulatory Visit: Payer: Medicare Other | Admitting: Internal Medicine

## 2018-05-27 DEATH — deceased

## 2018-06-13 ENCOUNTER — Ambulatory Visit: Payer: Medicare Other | Admitting: Internal Medicine

## 2018-06-13 ENCOUNTER — Ambulatory Visit: Payer: Medicare Other

## 2018-06-13 ENCOUNTER — Other Ambulatory Visit: Payer: Medicare Other

## 2018-07-04 ENCOUNTER — Ambulatory Visit: Payer: Medicare Other

## 2018-07-04 ENCOUNTER — Other Ambulatory Visit: Payer: Medicare Other

## 2018-07-04 ENCOUNTER — Ambulatory Visit: Payer: Medicare Other | Admitting: Oncology

## 2018-07-25 ENCOUNTER — Other Ambulatory Visit: Payer: Medicare Other

## 2018-07-25 ENCOUNTER — Ambulatory Visit: Payer: Medicare Other | Admitting: Internal Medicine

## 2018-07-25 ENCOUNTER — Ambulatory Visit: Payer: Medicare Other

## 2018-09-12 IMAGING — CT CT ABDOMEN W/ CM
2 of 5 series · 11 of 36 positions shown, 18 images · IV contrast (READICAT/WATER & [ID] ISOVUE 300)
Comparison: CT neck 04/23/2016

CLINICAL DATA: Metastatic cancer. Malignant adenopathy right neck.
History of breast cancer.

EXAM:
CT CHEST AND ABDOMEN WITH CONTRAST
TECHNIQUE: Multidetector CT imaging of the chest and abdomen was performed
following the standard protocol during bolus administration of
intravenous contrast.
CONTRAST:  100mL SZLI2L-HFF IOPAMIDOL (SZLI2L-HFF) INJECTION 61%

[Series 601: coronal body · coronal · 0.84mm/px · 1 of 123 slices shown, 2 images]
[im 41/123  soft-tissue]
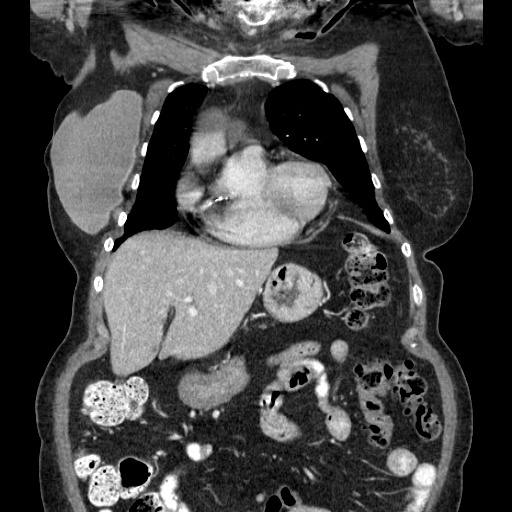
[im 41/123  bone]
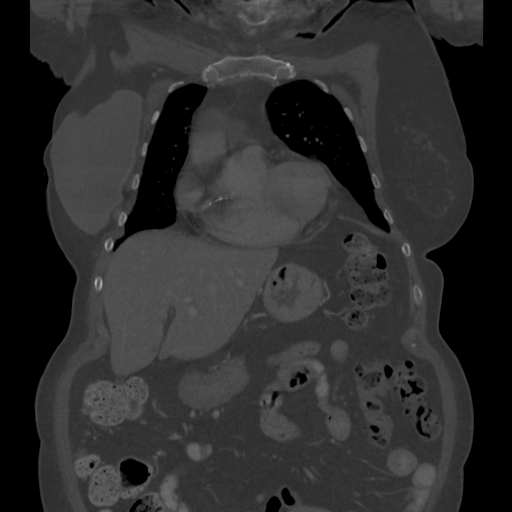

[Series 602: sagittal body · sagittal · 0.84mm/px · 10 of 160 slices shown, 16 images]
[im 14/160  soft-tissue]
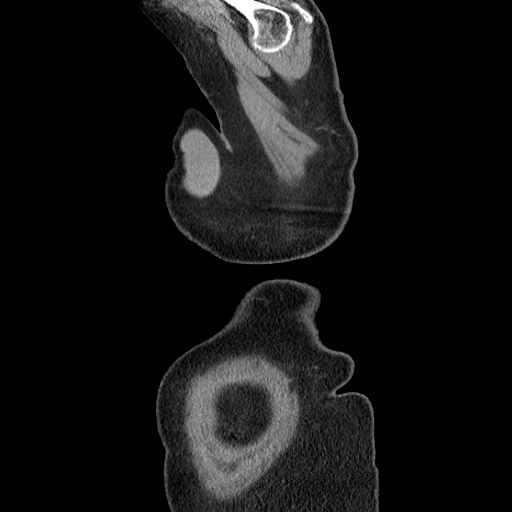
[im 14/160  lung]
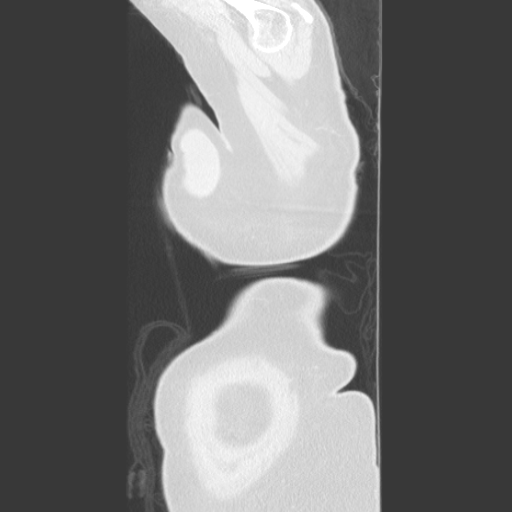
[im 14/160  bone]
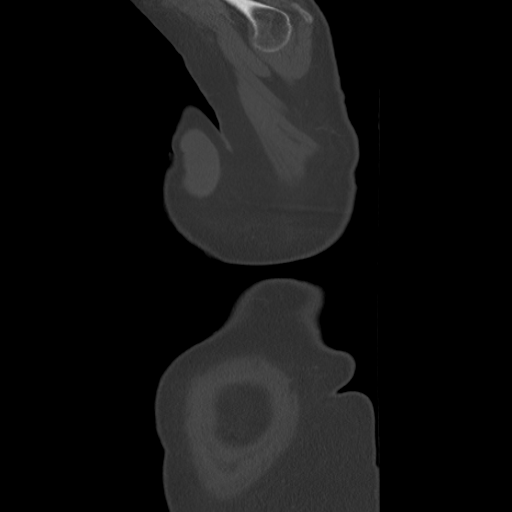
[im 27/160  soft-tissue]
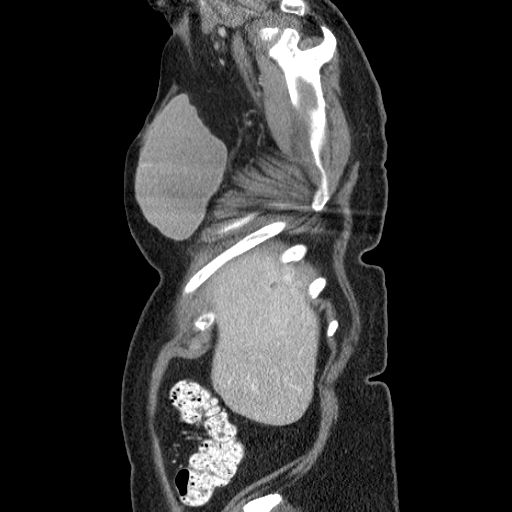
[im 27/160  lung]
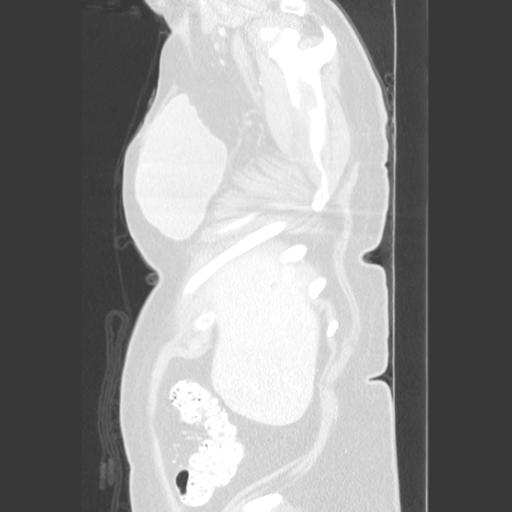
[im 40/160  soft-tissue]
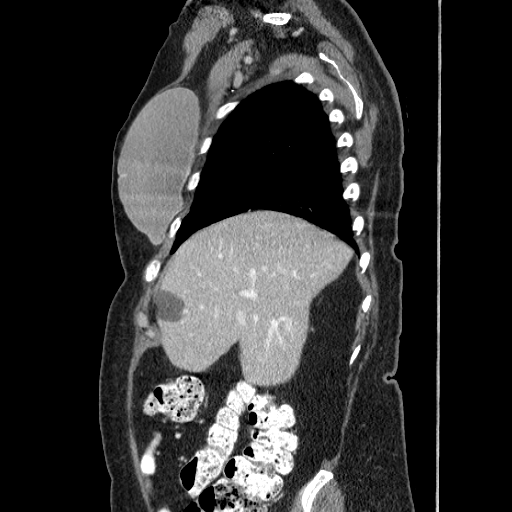
[im 40/160  lung]
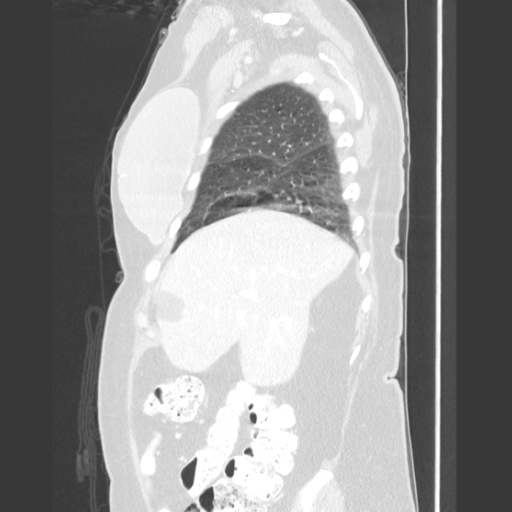
[im 54/160  soft-tissue]
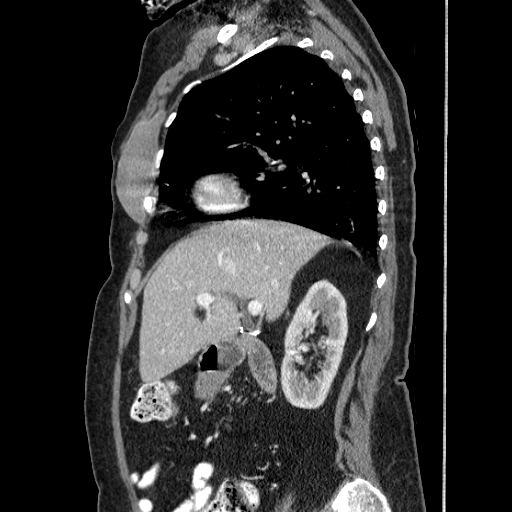
[im 54/160  lung]
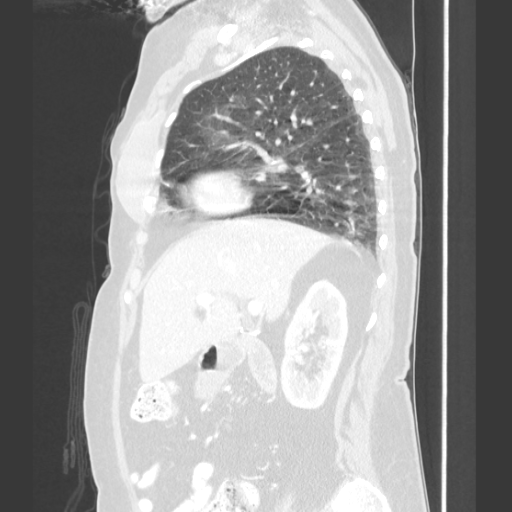
[im 67/160  soft-tissue]
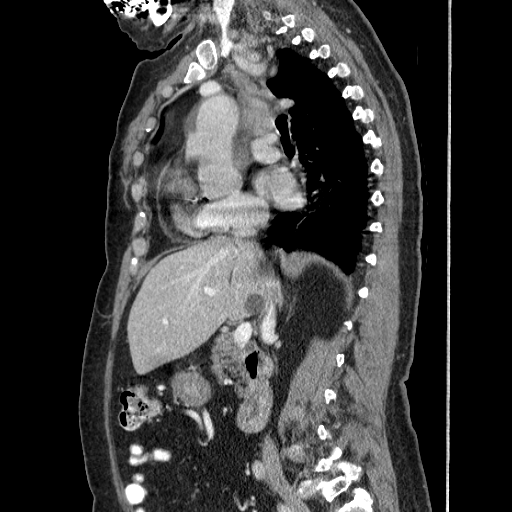
[im 93/160  soft-tissue]
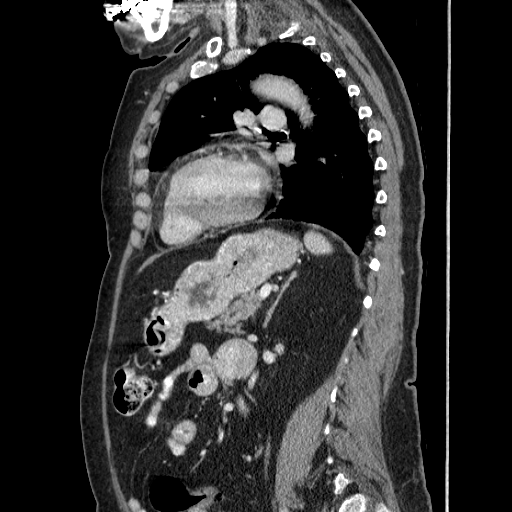
[im 107/160  soft-tissue]
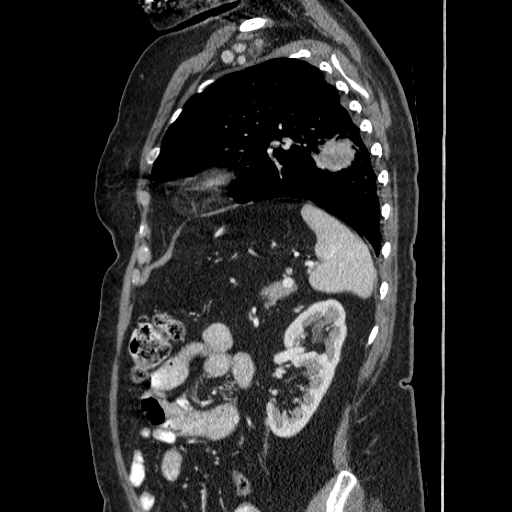
[im 120/160  soft-tissue]
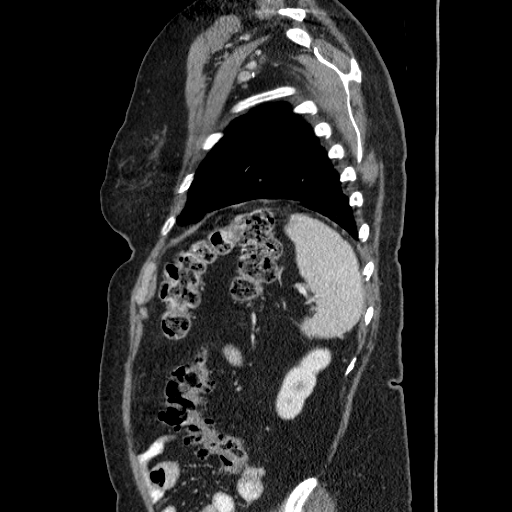
[im 133/160  soft-tissue]
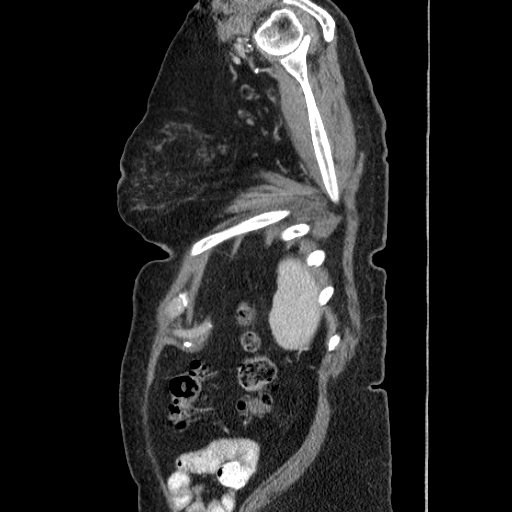
[im 133/160  bone]
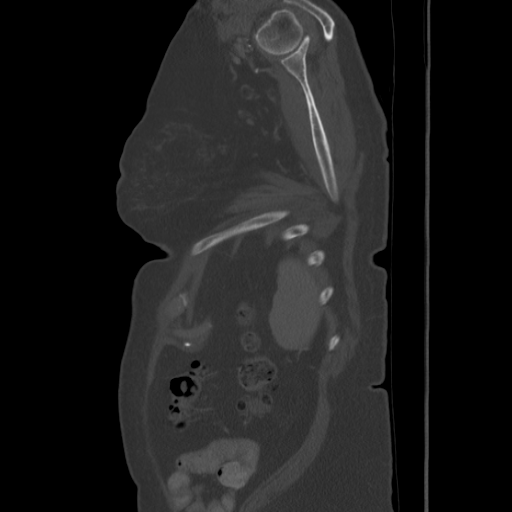
[im 146/160  soft-tissue]
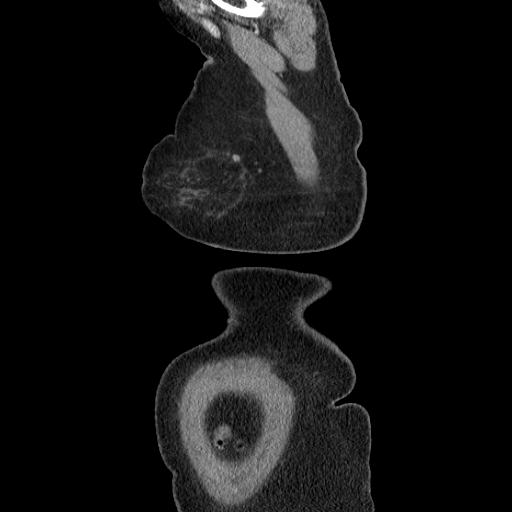

[11 of 36 positions shown; findings below may reference images not displayed]

FINDINGS: CT CHEST FINDINGS

Cardiovascular: Normal aorta and pulmonary arteries. Heart size
within normal limits. Mild coronary calcification. No pericardial
effusion.

Mediastinum/Nodes: Right peritracheal lymph node 18 mm. Right lower
peritracheal lymph node 23 mm. Subcarinal lymph node 13 mm. Left
upper paratracheal lymph node 22 mm. Left lower peritracheal lymph
node 13 mm.

Lungs/Pleura: Left lower lobe mass lesion measuring 3.1 x 3.3 cm
with irregular margins. Highly suspicious for bronchogenic
carcinoma.

Sub solid densities in the superior segment left lower lobe
measuring approximately 12 mm, and 15 mm. These could represent
additional areas of tumor or infection. No lung nodules on the
right.

Mild emphysema in the lung bases.  No pleural effusion.

Musculoskeletal: Thoracic disc degeneration. No fracture or
metastatic disease in the spine or ribs. Right mastectomy with
reconstruction.

CT ABDOMEN FINDINGS

Hepatobiliary: 22 x 34 mm cyst in the anterior liver. 28 mm cyst in
the caudate lobe. Several sub cm low-density lesions liver are
indeterminate but may represent cysts. 22 x 27 mm lesion in the
lateral right lobe liver with peripheral enhancement. This may
represent hemangioma. Metastatic disease not excluded.

Gallbladder surgically absent.  No biliary dilatation.

Pancreas: Negative

Spleen: Negative

Adrenals/Urinary Tract: No renal mass or obstruction or stone. No
adrenal mass.

Stomach/Bowel: Stomach and duodenum normal. Negative for bowel
obstruction. No bowel mass or edema.

Vascular/Lymphatic: Mild atherosclerotic disease in the aorta
without aneurysm. No abdominal lymphadenopathy.

Other: No free fluid.  Small umbilical hernia.

Musculoskeletal: Grade 2 anterior slip L4-5 with severe facet
degeneration and severe spinal stenosis. No fracture in the lumbar
spine. No evidence of metastatic disease in the spine.
IMPRESSION: Left lower lobe mass measuring 3.1 x 3.3 cm consistent with
bronchogenic carcinoma. Sub solid densities superior segment left
lower lobe could represent additional areas of tumor. There is
malignant adenopathy in the mediastinum and right peritracheal lymph
nodes.

Multiple liver lesions. Many of these are benign cysts. There is an
enhancing lesion in the right lobe liver which may represent a
hemangioma however could represent metastatic disease. Further
attention at PET scan is recommended. If this does not resolve the
issue, MRI of the liver without with contrast may be helpful.

Whole-body PET scanning recommended for staging.

Grade 2 anterior slip L4-5 with severe spinal stenosis.

At the patient's request, I reviewed the images and findings with
the patient and her nephew.
